# Patient Record
Sex: Male | Born: 1935 | Hispanic: Yes | Marital: Married | Smoking: Former smoker
Health system: Southern US, Community
[De-identification: ages and names within clinical notes are randomized; demographics above are authoritative.]

## PROBLEM LIST (undated history)

## (undated) DIAGNOSIS — C3490 Malignant neoplasm of unspecified part of unspecified bronchus or lung: Secondary | ICD-10-CM

## (undated) DIAGNOSIS — Z9289 Personal history of other medical treatment: Secondary | ICD-10-CM

## (undated) DIAGNOSIS — B9561 Methicillin susceptible Staphylococcus aureus infection as the cause of diseases classified elsewhere: Secondary | ICD-10-CM

## (undated) DIAGNOSIS — I4819 Other persistent atrial fibrillation: Secondary | ICD-10-CM

## (undated) DIAGNOSIS — I2699 Other pulmonary embolism without acute cor pulmonale: Secondary | ICD-10-CM

## (undated) DIAGNOSIS — I82401 Acute embolism and thrombosis of unspecified deep veins of right lower extremity: Secondary | ICD-10-CM

## (undated) DIAGNOSIS — I718 Aortic aneurysm of unspecified site, ruptured: Secondary | ICD-10-CM

## (undated) DIAGNOSIS — Z720 Tobacco use: Secondary | ICD-10-CM

## (undated) DIAGNOSIS — J90 Pleural effusion, not elsewhere classified: Secondary | ICD-10-CM

## (undated) DIAGNOSIS — R7881 Bacteremia: Secondary | ICD-10-CM

## (undated) HISTORY — DX: Malignant neoplasm of unspecified part of unspecified bronchus or lung: C34.90

## (undated) HISTORY — DX: Tobacco use: Z72.0

## (undated) HISTORY — PX: IRRIGATION AND DEBRIDEMENT SEBACEOUS CYST: SHX5255

## (undated) HISTORY — PX: APPENDECTOMY: SHX54

---

## 2020-12-29 ENCOUNTER — Other Ambulatory Visit: Payer: Self-pay

## 2020-12-29 ENCOUNTER — Encounter: Payer: Self-pay | Admitting: Pulmonary Disease

## 2020-12-29 ENCOUNTER — Other Ambulatory Visit: Payer: Self-pay | Admitting: Pulmonary Disease

## 2020-12-29 ENCOUNTER — Ambulatory Visit (INDEPENDENT_AMBULATORY_CARE_PROVIDER_SITE_OTHER): Payer: Medicare Other | Admitting: Pulmonary Disease

## 2020-12-29 VITALS — BP 128/60 | HR 68 | Temp 97.8°F | Ht 68.5 in | Wt 182.6 lb

## 2020-12-29 DIAGNOSIS — R059 Cough, unspecified: Secondary | ICD-10-CM | POA: Diagnosis not present

## 2020-12-29 DIAGNOSIS — R918 Other nonspecific abnormal finding of lung field: Secondary | ICD-10-CM

## 2020-12-29 NOTE — Patient Instructions (Addendum)
Nice to meet you  We have scheduled a biopsy for you on 01/07/2021 at 7:30 AM.  Please arrive at the endoscopy center at [facility] at 5:30 AM.  Please have nothing to eat or drink after midnight the night prior.  Dr. Valeta Harms will do the procedure.  I have already discussed everything with him and he has already looked at the CT scan.

## 2020-12-29 NOTE — Progress Notes (Signed)
@Patient  ID: Leon Lyons, male    DOB: [birth year], 87 y.o.   MRN: [id number]  Chief Complaint  Patient presents with  . Consult    Occasional productive cough with white/clear phlegm for past 3-4 weeks    Referring provider: [name], MD  HPI:   87 year old whom we are seeing in consultation for evaluation of left lung mass with atelectasis.  Patient had onset of productive cough in January 2022.  At that time he was evaluated PCP.  Covid swab was positive.  It was expected he would have improved symptoms over time.  However, after a couple months, several weeks, he continued to have productive cough.  Thick white phlegm.  Worse in the mornings and when he lies down at night.  No new environment to account for the symptoms.  No change with the season, warm weather, pollen.  No other alleviating or exacerbating factors.  Given on going symptoms he insisted on chest x-ray.  This was obtained 12/27/2020 which on my review and interpretation reveals left upper lung field mass with what appears to be volume loss with elevated left hemidiaphragm and partial atelectasis of the left upper lobe given haziness the mediastinal border, this is also evident and more clear on the lateral view.  This prompted CT chest with contrast 12/28/2020 which on my interpretation reveals left upper lobe mass with obstruction of left upper lobe takeoff and atelectasis of left upper lobe with aeration of the lingula with bronchus headed to lingula externally compressed, left station 4 adenopathy, station 7 adenopathy, smaller but apparent right station 4 adenopathy.  Patient takes no medications.  He is on no blood thinners.  Has no respiratory issues, no shortness of breath.  Thinks maybe he was diagnosed or could have had some asthma attack back in 2009.  Describes acute onset of shortness of breath, throat tightness.  This is never recurred since.  No clear questionable why this is happened.  Never took  medicines for it.  Extensive conversation including extensive review of his chest images with patient and wife present today.  Discussed at length concern for cancer and the rationale for concern for cancer based on the images, symptoms.  Discussed at length multiple routes of biopsy to find what this is.  Explained rationale and data for the modality EBUS for obtaining diagnosis also potential staging.  They expressed understanding.  They are eager to move forward with diagnostic tests.  They are planning hoping this is not cancer but again reiterated most likely this is lung cancer and we should beprepared to move forward with cancer therapy once we get results.  PMH: None Surgical history: Reviewed patient, none Family history: No significant respiratory illness in first-degree relatives Social history:.  Career in the [employer], later worked for [employer], grew up in [place], lived [place] of life in [place], moved back to [place] in 2020  ACT:  No flowsheet data found.  MMRC: No flowsheet data found.  Epworth:  No flowsheet data found.  Tests:   FENO:  No results found for: NITRICOXIDE  PFT: No flowsheet data found.  WALK:  No flowsheet data found.  Imaging: Personally reviewed and as per EMR  Lab Results: None to do at this time CBC No results found for: WBC, RBC, HGB, HCT, PLT, MCV, MCH, MCHC, RDW, LYMPHSABS, MONOABS, EOSABS, BASOSABS  BMET No results found for: NA, K, CL, CO2, GLUCOSE, BUN, CREATININE, CALCIUM, GFRNONAA, GFRAA  BNP No results found for:  BNP  ProBNP No results found for: PROBNP  Specialty Problems   None     No Known Allergies   There is no immunization history on file for this patient.  History reviewed. No pertinent past medical history.  Tobacco History: Social History   Tobacco Use  Smoking Status Former Smoker  . Years: 5.00  . Types: Cigarettes  . Quit date: 65  . Years since quitting: 60.2  Smokeless Tobacco  Never Used  Tobacco Comment   light/occasional smoker back in early 1960's   Counseling given: Not Answered Comment: light/occasional smoker back in early 1960's   Continue to not smoke  No outpatient encounter medications on file as of 12/29/2020.   No facility-administered encounter medications on file as of 12/29/2020.     Review of Systems  Review of Systems  No chest pain with exertion.  No headache.  No neurologic changes, weakness, gait disturbance, falls.  Comprehensive review of systems otherwise negative. Physical Exam  BP 128/60 (BP Location: Left Arm, Cuff Size: Normal)   Pulse 68   Temp 97.8 F (36.6 C) (Temporal)   Ht 5' 8.5" (1.74 m)   Wt 182 lb 9.6 oz (82.8 kg)   SpO2 98% Comment: Room air  BMI 27.36 kg/m   Wt Readings from Last 5 Encounters:  12/29/20 182 lb 9.6 oz (82.8 kg)    BMI Readings from Last 5 Encounters:  12/29/20 27.36 kg/m     Physical Exam General: Well-appearing, no distress Eyes: EOMI, icterus Neck: Supple, no JVP Cardiovascular: Regular rhythm occasional extra beat, no murmur Pulmonary: Clear aspiration bilaterally, purely bronchial breath sounds in left upper lung field Abdomen: Nondistended, vessels present MSK: No synovitis, joint effusion Neuro: Normal gait, no weakness Psych: Normal mood, full affect    Assessment & Plan:   Left upper lobe mass:: Obstruction of left upper lobe, lingula seems somewhat patent at this time.  Also with mediastinal lymphadenopathy.  High suspicion for primary lung cancer.  This was discussed with patient at length today.  Discussed the rationale for biopsy and the rationale for EBUS as mode of biopsy.  He expressed understanding.  He is eager to proceed.  Discussed with Dr. Valeta Harms.  Planning for procedure 01/07/21 at 7:30 AM at [facility] with Dr. Valeta Harms.  All questions answered.  Likely would benefit from MRI, PET scan in future.  This can be deferred until biopsy obtained, engaged with  oncology.  Cough: Ongoing cough likely related to lung mass, mucus impaction, inflammation.  Difficult to treat until we know what the lung mass is and potentially have the lung mass treated.  He expressed understanding.  Return if symptoms worsen or fail to improve.   Lanier Clam, MD 12/29/2020   This appointment required 85 minutes of patient care (this includes precharting, chart review, review of results, face-to-face care, etc.).

## 2020-12-29 NOTE — Progress Notes (Signed)
PCCM:  Referral from Dr. Silas Flood.   EBUS orders placed  Date: 01/07/2021   Garner Nash, DO  Pulmonary Critical Care 12/29/2020 2:36 PM

## 2020-12-29 NOTE — H&P (View-Only) (Signed)
@Patient  ID: Leon Lyons, male    DOB: 1933/07/23, 87 y.o.   MRN: [id number]  Chief Complaint  Patient presents with  . Consult    Occasional productive cough with white/clear phlegm for past 3-4 weeks    Referring provider: [name], MD  HPI:   87 year old whom we are seeing in consultation for evaluation of left lung mass with atelectasis.  Patient had onset of productive cough in January 2022.  At that time he was evaluated PCP.  Covid swab was positive.  It was expected he would have improved symptoms over time.  However, after a couple months, several weeks, he continued to have productive cough.  Thick white phlegm.  Worse in the mornings and when he lies down at night.  No new environment to account for the symptoms.  No change with the season, warm weather, pollen.  No other alleviating or exacerbating factors.  Given on going symptoms he insisted on chest x-ray.  This was obtained 12/27/2020 which on my review and interpretation reveals left upper lung field mass with what appears to be volume loss with elevated left hemidiaphragm and partial atelectasis of the left upper lobe given haziness the mediastinal border, this is also evident and more clear on the lateral view.  This prompted CT chest with contrast 12/28/2020 which on my interpretation reveals left upper lobe mass with obstruction of left upper lobe takeoff and atelectasis of left upper lobe with aeration of the lingula with bronchus headed to lingula externally compressed, left station 4 adenopathy, station 7 adenopathy, smaller but apparent right station 4 adenopathy.  Patient takes no medications.  He is on no blood thinners.  Has no respiratory issues, no shortness of breath.  Thinks maybe he was diagnosed or could have had some asthma attack back in 2009.  Describes acute onset of shortness of breath, throat tightness.  This is never recurred since.  No clear questionable why this is happened.  Never took  medicines for it.  Extensive conversation including extensive review of his chest images with patient and wife present today.  Discussed at length concern for cancer and the rationale for concern for cancer based on the images, symptoms.  Discussed at length multiple routes of biopsy to find what this is.  Explained rationale and data for the modality EBUS for obtaining diagnosis also potential staging.  They expressed understanding.  They are eager to move forward with diagnostic tests.  They are planning hoping this is not cancer but again reiterated most likely this is lung cancer and we should beprepared to move forward with cancer therapy once we get results.  PMH: None Surgical history: Reviewed patient, none Family history: No significant respiratory illness in first-degree relatives Social history:.  Career in the [employer], later worked for [employer], grew up in [place], lived [place] of life in [place], moved back to [place] in 2020  ACT:  No flowsheet data found.  MMRC: No flowsheet data found.  Epworth:  No flowsheet data found.  Tests:   FENO:  No results found for: NITRICOXIDE  PFT: No flowsheet data found.  WALK:  No flowsheet data found.  Imaging: Personally reviewed and as per EMR  Lab Results: None to do at this time CBC No results found for: WBC, RBC, HGB, HCT, PLT, MCV, MCH, MCHC, RDW, LYMPHSABS, MONOABS, EOSABS, BASOSABS  BMET No results found for: NA, K, CL, CO2, GLUCOSE, BUN, CREATININE, CALCIUM, GFRNONAA, GFRAA  BNP No results found for:  BNP  ProBNP No results found for: PROBNP  Specialty Problems   None     No Known Allergies   There is no immunization history on file for this patient.  History reviewed. No pertinent past medical history.  Tobacco History: Social History   Tobacco Use  Smoking Status Former Smoker  . Years: 5.00  . Types: Cigarettes  . Quit date: 106  . Years since quitting: 60.2  Smokeless Tobacco  Never Used  Tobacco Comment   light/occasional smoker back in early 1960's   Counseling given: Not Answered Comment: light/occasional smoker back in early 1960's   Continue to not smoke  No outpatient encounter medications on file as of 12/29/2020.   No facility-administered encounter medications on file as of 12/29/2020.     Review of Systems  Review of Systems  No chest pain with exertion.  No headache.  No neurologic changes, weakness, gait disturbance, falls.  Comprehensive review of systems otherwise negative. Physical Exam  BP 128/60 (BP Location: Left Arm, Cuff Size: Normal)   Pulse 68   Temp 97.8 F (36.6 C) (Temporal)   Ht 5' 8.5" (1.74 m)   Wt 182 lb 9.6 oz (82.8 kg)   SpO2 98% Comment: Room air  BMI 27.36 kg/m   Wt Readings from Last 5 Encounters:  12/29/20 182 lb 9.6 oz (82.8 kg)    BMI Readings from Last 5 Encounters:  12/29/20 27.36 kg/m     Physical Exam General: Well-appearing, no distress Eyes: EOMI, icterus Neck: Supple, no JVP Cardiovascular: Regular rhythm occasional extra beat, no murmur Pulmonary: Clear aspiration bilaterally, purely bronchial breath sounds in left upper lung field Abdomen: Nondistended, vessels present MSK: No synovitis, joint effusion Neuro: Normal gait, no weakness Psych: Normal mood, full affect    Assessment & Plan:   Left upper lobe mass:: Obstruction of left upper lobe, lingula seems somewhat patent at this time.  Also with mediastinal lymphadenopathy.  High suspicion for primary lung cancer.  This was discussed with patient at length today.  Discussed the rationale for biopsy and the rationale for EBUS as mode of biopsy.  He expressed understanding.  He is eager to proceed.  Discussed with Dr. Valeta Harms.  Planning for procedure 01/07/21 at 7:30 AM at [facility] with Dr. Valeta Harms.  All questions answered.  Likely would benefit from MRI, PET scan in future.  This can be deferred until biopsy obtained, engaged with  oncology.  Cough: Ongoing cough likely related to lung mass, mucus impaction, inflammation.  Difficult to treat until we know what the lung mass is and potentially have the lung mass treated.  He expressed understanding.  Return if symptoms worsen or fail to improve.   Lanier Clam, MD 12/29/2020   This appointment required 85 minutes of patient care (this includes precharting, chart review, review of results, face-to-face care, etc.).

## 2020-12-30 ENCOUNTER — Telehealth: Payer: Self-pay | Admitting: Pulmonary Disease

## 2020-12-30 NOTE — Telephone Encounter (Signed)
EBUS has been schedule for 01/07/21 at 7:30am. Pt to arrive at check in by 5:30am. Informed pt of procedure and prep. Sched covid test for 01/04/21.//TM

## 2021-01-04 ENCOUNTER — Other Ambulatory Visit (HOSPITAL_COMMUNITY): Payer: Medicare Other

## 2021-01-06 NOTE — Anesthesia Preprocedure Evaluation (Addendum)
Anesthesia Evaluation  Patient identified by MRN, date of birth, ID band Patient awake    Reviewed: Allergy & Precautions, H&P , NPO status , Patient's Chart, lab work & pertinent test results  Airway Mallampati: I  TM Distance: >3 FB Neck ROM: Full    Dental no notable dental hx. (+) Edentulous Upper, Missing, Poor Dentition, Dental Advisory Given,    Pulmonary neg pulmonary ROS, former smoker,    Pulmonary exam normal breath sounds clear to auscultation       Cardiovascular Exercise Tolerance: Good negative cardio ROS Normal cardiovascular exam Rhythm:Regular Rate:Normal     Neuro/Psych negative neurological ROS  negative psych ROS   GI/Hepatic negative GI ROS, Neg liver ROS,   Endo/Other  negative endocrine ROS  Renal/GU negative Renal ROS  negative genitourinary   Musculoskeletal negative musculoskeletal ROS (+)   Abdominal   Peds negative pediatric ROS (+)  Hematology negative hematology ROS (+)   Anesthesia Other Findings   Reproductive/Obstetrics negative OB ROS                            Anesthesia Physical Anesthesia Plan  ASA: III  Anesthesia Plan: General   Post-op Pain Management:    Induction: Intravenous  PONV Risk Score and Plan: 2 and Ondansetron  Airway Management Planned: Oral ETT  Additional Equipment: None  Intra-op Plan:   Post-operative Plan: Extubation in OR  Informed Consent: I have reviewed the patients History and Physical, chart, labs and discussed the procedure including the risks, benefits and alternatives for the proposed anesthesia with the patient or authorized representative who has indicated his/her understanding and acceptance.       Plan Discussed with: CRNA and Anesthesiologist  Anesthesia Plan Comments: (  )        Anesthesia Quick Evaluation

## 2021-01-06 NOTE — Progress Notes (Signed)
I have attempted to contact this patient by phone with no  Results. I instructed - to arrive at -0530-come to the Main Entrance, Entrance 'A'.  You will go in and go to Admitting Office- it is down the hall on the left.  Do Not Eat or Drink after Midnight.  Do not eat after midnight, no gumming gum or hard candy.   Shower with antibiotic soap if you have it, rinse thoroughly.  Dry off with a clean towel. Do not wear any lotions, powders, colognes, deodorant. Men my shave face and neck. Women should not shave within 48 hours prior to surgery.  Brush your teeth. If you wear glasses, contact lens, hearing aids, dentures or partials; you may not wear them in OR, if you have a case for your glasses- bring it. It is best to not wear hearing aids- if it is the only way we can communicate wear them , bring the case- have someone to take the hearing aids and case after you are finished answering questions. Dentures and partials will be removed and placed in a denture cup and placed with your clothes.  Take the following medications with a sip of water   Tylenol if needed. .  If you have any questions or problems or if you are going to be late call [phone], this is the pre- surgery desk; there may not be anyone at the number I called you on tomorrow, so call the number I called you on.  If you are going home after the surgery/procedure, you will need someone to drive you home and stay with you for the first 24 hours after surgery.  If you smoke do not smoke for 24 hours prior to surgery.

## 2021-01-07 ENCOUNTER — Other Ambulatory Visit: Payer: Self-pay

## 2021-01-07 ENCOUNTER — Ambulatory Visit (HOSPITAL_COMMUNITY)
Admission: RE | Admit: 2021-01-07 | Discharge: 2021-01-07 | Disposition: A | Discharge status: Home / Self Care | Payer: Medicare Other | Attending: Pulmonary Disease | Admitting: Pulmonary Disease

## 2021-01-07 ENCOUNTER — Encounter (HOSPITAL_COMMUNITY): Payer: Self-pay | Admitting: Pulmonary Disease

## 2021-01-07 ENCOUNTER — Ambulatory Visit (HOSPITAL_COMMUNITY): Payer: Medicare Other | Admitting: Anesthesiology

## 2021-01-07 ENCOUNTER — Encounter (HOSPITAL_COMMUNITY)
Admission: RE | Disposition: A | Discharge status: Home / Self Care | Payer: Self-pay | Source: Home / Self Care | Attending: Pulmonary Disease

## 2021-01-07 ENCOUNTER — Telehealth: Payer: Self-pay | Admitting: Internal Medicine

## 2021-01-07 DIAGNOSIS — Z8616 Personal history of COVID-19: Secondary | ICD-10-CM | POA: Diagnosis not present

## 2021-01-07 DIAGNOSIS — R918 Other nonspecific abnormal finding of lung field: Secondary | ICD-10-CM | POA: Diagnosis present

## 2021-01-07 DIAGNOSIS — Z87891 Personal history of nicotine dependence: Secondary | ICD-10-CM | POA: Insufficient documentation

## 2021-01-07 DIAGNOSIS — C3412 Malignant neoplasm of upper lobe, left bronchus or lung: Secondary | ICD-10-CM | POA: Diagnosis not present

## 2021-01-07 DIAGNOSIS — C771 Secondary and unspecified malignant neoplasm of intrathoracic lymph nodes: Secondary | ICD-10-CM | POA: Insufficient documentation

## 2021-01-07 HISTORY — PX: BRONCHIAL WASHINGS: SHX5105

## 2021-01-07 HISTORY — PX: BRONCHIAL NEEDLE ASPIRATION BIOPSY: SHX5106

## 2021-01-07 HISTORY — PX: VIDEO BRONCHOSCOPY WITH ENDOBRONCHIAL ULTRASOUND: SHX6177

## 2021-01-07 HISTORY — PX: BRONCHIAL BRUSHINGS: SHX5108

## 2021-01-07 HISTORY — PX: BRONCHIAL BIOPSY: SHX5109

## 2021-01-07 SURGERY — BRONCHOSCOPY, WITH EBUS
Anesthesia: General | Laterality: Bilateral

## 2021-01-07 MED ORDER — LIDOCAINE 2% (20 MG/ML) 5 ML SYRINGE
INTRAMUSCULAR | Status: DC | PRN
  Administered 2021-01-07: 100 mg via INTRAVENOUS

## 2021-01-07 MED ORDER — ALBUTEROL SULFATE (2.5 MG/3ML) 0.083% IN NEBU
2.5000 mg | INHALATION_SOLUTION | Freq: Once | RESPIRATORY_TRACT | Status: AC
  Administered 2021-01-07: 2.5 mg via RESPIRATORY_TRACT

## 2021-01-07 MED ORDER — SUGAMMADEX SODIUM 200 MG/2ML IV SOLN
INTRAVENOUS | Status: DC | PRN
  Administered 2021-01-07: 100 mg via INTRAVENOUS

## 2021-01-07 MED ORDER — CHLORHEXIDINE GLUCONATE 0.12 % MT SOLN: 15.0000 mL | Freq: Once | OROMUCOSAL | Status: AC

## 2021-01-07 MED ORDER — ONDANSETRON HCL 4 MG/2ML IJ SOLN
INTRAMUSCULAR | Status: DC | PRN
  Administered 2021-01-07: 4 mg via INTRAVENOUS

## 2021-01-07 MED ORDER — LACTATED RINGERS IV SOLN: INTRAVENOUS | Status: DC

## 2021-01-07 MED ORDER — EPHEDRINE SULFATE-NACL 50-0.9 MG/10ML-% IV SOSY
PREFILLED_SYRINGE | INTRAVENOUS | Status: DC | PRN
  Administered 2021-01-07 (×2): 10 mg via INTRAVENOUS

## 2021-01-07 MED ORDER — ROCURONIUM BROMIDE 10 MG/ML (PF) SYRINGE
PREFILLED_SYRINGE | INTRAVENOUS | Status: DC | PRN
  Administered 2021-01-07: 50 mg via INTRAVENOUS

## 2021-01-07 MED ORDER — ALBUTEROL SULFATE (2.5 MG/3ML) 0.083% IN NEBU
INHALATION_SOLUTION | RESPIRATORY_TRACT | Status: AC
  Filled 2021-01-07: qty 3

## 2021-01-07 MED ORDER — FENTANYL CITRATE (PF) 250 MCG/5ML IJ SOLN
INTRAMUSCULAR | Status: DC | PRN
  Administered 2021-01-07: 50 ug via INTRAVENOUS

## 2021-01-07 MED ORDER — CHLORHEXIDINE GLUCONATE 0.12 % MT SOLN
OROMUCOSAL | Status: AC
  Administered 2021-01-07: 15 mL via OROMUCOSAL
  Filled 2021-01-07: qty 15

## 2021-01-07 MED ORDER — PHENYLEPHRINE 40 MCG/ML (10ML) SYRINGE FOR IV PUSH (FOR BLOOD PRESSURE SUPPORT)
PREFILLED_SYRINGE | INTRAVENOUS | Status: DC | PRN
  Administered 2021-01-07 (×2): 80 ug via INTRAVENOUS

## 2021-01-07 MED ORDER — PROPOFOL 10 MG/ML IV BOLUS
INTRAVENOUS | Status: DC | PRN
  Administered 2021-01-07: 110 mg via INTRAVENOUS

## 2021-01-07 SURGICAL SUPPLY — 29 items

## 2021-01-07 NOTE — Op Note (Signed)
Video Bronchoscopy with Endobronchial Ultrasound Procedure Note  Date of Operation: 01/07/2021  Pre-op Diagnosis: Lung mass, mediastinal adenopathy  Post-op Diagnosis: Lung mass, mediastinal adenopathy  Surgeon: Garner Nash, DO   Assistants: None   Anesthesia: General endotracheal anesthesia  Operation: Flexible video fiberoptic bronchoscopy with endobronchial ultrasound and biopsies.  Estimated Blood Loss: Minimal  Complications: None   Indications and History: Leon Lyons is a 87 y.o. adult with left upper lobe lung mass, mediastinal adenopathy.  The risks, benefits, complications, treatment options and expected outcomes were discussed with the patient.  The possibilities of pneumothorax, pneumonia, reaction to medication, pulmonary aspiration, perforation of a viscus, bleeding, failure to diagnose a condition and creating a complication requiring transfusion or operation were discussed with the patient who freely signed the consent.    Description of Procedure: The patient was examined in the preoperative area and history and data from the preprocedure consultation were reviewed. It was deemed appropriate to proceed.  The patient was taken to [facility] endoscopy room 2, identified as Leon Lyons and the procedure verified as Flexible Video Fiberoptic Bronchoscopy.  A Time Out was held and the above information confirmed. After being taken to the operating room general anesthesia was initiated and the patient  was orally intubated. The video fiberoptic bronchoscope was introduced via the endotracheal tube and a general inspection was performed which showed normal right lung anatomy, left lower lobe subsegments patent, normal carina, left upper lobe anterior segment fully occluded by tumor, submucosal tumor infiltration and splaying of the left main carina. The standard scope was then withdrawn and the endobronchial ultrasound was used to identify and characterize the peritracheal,  hilar and bronchial lymph nodes. Inspection showed enlarged station 7 adenopathy and visible left upper lobe mass under ultrasound.. Using real-time ultrasound guidance Wang needle biopsies were take from Station 7 nodes and were sent for cytology.  Following endobronchial ultrasound we switch back to the standard therapeutic bronchoscope.  We used cytology brushes to obtain specimens from the left upper lobe, then using 2.5 mm [employer] forceps we obtained endobronchial biopsies of the abnormal mucosa and tumor occluding the left upper lobe anterior segment.  There was no visible airway identified through the left upper lobe anterior segment to consider debulking or opening to help relieve this obstruction.  At the conclusion we completed a left upper lobe BAL to be sent for cytology with moderate return of saline. The patient tolerated the procedure well without apparent complications.  The standard therapeutic bronchoscope was used for aspiration of the bilateral mainstem's and removal of any blood clots and debris. There was no significant blood loss. The bronchoscope was withdrawn. Anesthesia was reversed and the patient was taken to the PACU for recovery.   Samples: 1. Wang needle biopsies from station 7 node 2.  Left upper lobe anterior segment brushings 3.  Left upper lobe endobronchial biopsies 4.  Left upper lobe bronchial washings  Plans:  The patient will be discharged from the PACU to home when recovered from anesthesia. We will review the cytology, pathology results with the patient when they become available. Outpatient followup will be with Garner Nash, [place], DO [place] [facility] 01/07/2021 8:53 AM

## 2021-01-07 NOTE — Anesthesia Procedure Notes (Signed)
Procedure Name: Intubation Date/Time: 01/07/2021 7:42 AM Performed by: Trinna Post., CRNA Pre-anesthesia Checklist: Patient identified, Emergency Drugs available, Suction available, Patient being monitored and Timeout performed Patient Re-evaluated:Patient Re-evaluated prior to induction Oxygen Delivery Method: Circle system utilized Preoxygenation: Pre-oxygenation with 100% oxygen Induction Type: IV induction Ventilation: Mask ventilation without difficulty and Oral airway inserted - appropriate to patient size Laryngoscope Size: Mac and 4 Grade View: Grade I Tube type: Oral Tube size: 8.0 mm Number of attempts: 1 Airway Equipment and Method: Stylet Placement Confirmation: ETT inserted through vocal cords under direct vision,  positive ETCO2 and breath sounds checked- equal and bilateral Secured at: 22 cm Tube secured with: Tape Dental Injury: Teeth and Oropharynx as per pre-operative assessment

## 2021-01-07 NOTE — Interval H&P Note (Signed)
History and Physical Interval Note:  01/07/2021 1:27 AM  Leon Lyons  has presented today for surgery, with the diagnosis of lung mass.  The various methods of treatment have been discussed with the patient and family. After consideration of risks, benefits and other options for treatment, the patient has consented to  Procedure(s) with comments: [place] (Bilateral) - possible need for radial ultrasound and fluoro  as a surgical intervention.  The patient's history has been reviewed, patient examined, no change in status, stable for surgery.  I have reviewed the patient's chart and labs.  Questions were answered to the patient's satisfaction.     [place]

## 2021-01-07 NOTE — Consult Note (Signed)
Synopsis: Referred in March 2022 for video bronchoscopy by No ref. provider found  Subjective:   PATIENT ID: Leon Lyons GENDER: male DOB: 08/06/1933, MRN: [id number]  No chief complaint on file.   This is an 87 year old gentleman, relatively healthy takes no medications except over-the-counter vitamin supplementation.  He is a former smoker quit in 1962.  He was taken to [facility] ED with complaints of shortness of breath.  Patient had a CT scan of the chest for evaluation of cough.  He had new onset productive cough in January 2022.  He was evaluated by PCP which is Covid swab was positive.  He continued to have cough-like symptoms and upper respiratory symptoms and the decision was made for CT scan of the chest which revealed a left upper lobe mass obstructing the left upper lobe takeoff, associated lung collapse, adenopathy within 4L and station 7.  Concern for an advanced stage bronchogenic carcinoma.  Patient takes no medications on no blood thinners.  They discussed the concern of malignancy and cancer diagnosis at office visit with Dr. Silas Flood.  Past medical history: Positive swab for Covid, family history: No history of lung cancer   Past Surgical History:  Procedure Laterality Date  . APPENDECTOMY    . IRRIGATION AND DEBRIDEMENT SEBACEOUS CYST      Social History   Socioeconomic History  . Marital status: Unknown    Spouse name: Not on file  . Number of children: Not on file  . Years of education: Not on file  . Highest education level: Not on file  Occupational History  . Not on file  Tobacco Use  . Smoking status: Former Smoker    Years: 5.00    Types: Cigarettes    Quit date: 1962    Years since quitting: 60.2  . Smokeless tobacco: Never Used  . Tobacco comment: light/occasional smoker back in early 1960's  Vaping Use  . Vaping Use: Never used  Substance and Sexual Activity  . Alcohol use: Not on file  . Drug use: Not on file  . Sexual activity: Not  on file  Other Topics Concern  . Not on file  Social History Narrative  . Not on file   Social Determinants of Health   Financial Resource Strain: Not on file  Food Insecurity: Not on file  Transportation Needs: Not on file  Physical Activity: Not on file  Stress: Not on file  Social Connections: Not on file  Intimate Partner Violence: Not on file     No Known Allergies    Review of Systems  Constitutional: Negative for chills, fever, malaise/fatigue and weight loss.  HENT: Negative for hearing loss, sore throat and tinnitus.   Eyes: Negative for blurred vision and double vision.  Respiratory: Positive for cough. Negative for hemoptysis, sputum production, shortness of breath, wheezing and stridor.   Cardiovascular: Negative for chest pain, palpitations, orthopnea, leg swelling and PND.  Gastrointestinal: Negative for abdominal pain, constipation, diarrhea, heartburn, nausea and vomiting.  Genitourinary: Negative for dysuria, hematuria and urgency.  Musculoskeletal: Negative for joint pain and myalgias.  Skin: Negative for itching and rash.  Neurological: Negative for dizziness, tingling, weakness and headaches.  Endo/Heme/Allergies: Negative for environmental allergies. Does not bruise/bleed easily.  Psychiatric/Behavioral: Negative for depression. The patient is not nervous/anxious and does not have insomnia.   All other systems reviewed and are negative.    Objective:  Physical Exam Vitals reviewed.  Constitutional:      General: She is not  in acute distress.    Appearance: She is well-developed.     Comments: Elderly in good health  HENT:     Head: Normocephalic and atraumatic.  Eyes:     General: No scleral icterus.    Conjunctiva/sclera: Conjunctivae normal.     Pupils: Pupils are equal, round, and reactive to light.  Neck:     Vascular: No JVD.     Trachea: No tracheal deviation.  Cardiovascular:     Rate and Rhythm: Normal rate and regular rhythm.      Heart sounds: Normal heart sounds. No murmur heard.   Pulmonary:     Effort: Pulmonary effort is normal. No tachypnea, accessory muscle usage or respiratory distress.     Breath sounds: Normal breath sounds. No stridor. No wheezing, rhonchi or rales.  Abdominal:     General: Bowel sounds are normal. There is no distension.     Palpations: Abdomen is soft.     Tenderness: There is no abdominal tenderness.  Musculoskeletal:        General: No tenderness.     Cervical back: Neck supple.  Lymphadenopathy:     Cervical: No cervical adenopathy.  Skin:    General: Skin is warm and dry.     Capillary Refill: Capillary refill takes less than 2 seconds.     Findings: No rash.  Neurological:     Mental Status: She is alert and oriented to person, place, and time.  Psychiatric:        Behavior: Behavior normal.      Vitals:   01/07/21 0611  BP: (!) 147/67  Pulse: 62  Resp: 17  Temp: 98.7 F (37.1 C)  TempSrc: Oral  SpO2: 97%  Weight: 83 kg  Height: 5' 8.5" (1.74 m)   97% on RA BMI Readings from Last 3 Encounters:  01/07/21 27.42 kg/m  12/29/20 27.36 kg/m   Wt Readings from Last 3 Encounters:  01/07/21 83 kg  12/29/20 82.8 kg    CBC No results found for: WBC, RBC, HGB, HCT, PLT, MCV, MCH, MCHC, RDW, LYMPHSABS, MONOABS, EOSABS, BASOSABS   Chest Imaging: CT chest March 2022 completed at [facility] available in PACS system: Images reviewed today with patient.  Has an obstructing left upper lobe mass with associated adenopathy concerning for an advanced age bronchogenic carcinoma.   Pulmonary Functions Testing Results: No flowsheet data found.  FeNO:   Pathology:   Echocardiogram:   Heart Catheterization:     Assessment & Plan:   Left upper lobe lung mass Former smoker Mediastinal and hilar adenopathy concerning for an advanced age bronchogenic carcinoma.  Discussion: Today we discussed the risk benefits and alternatives of proceeding with  video bronchoscopy with endobronchial ultrasound. The patient is agreeable to proceed for tissue biopsy. We discussed the risk of bleeding and pneumothorax. I explained that once we have a tissue diagnosis he will also need a PET scan and an MRI. These will be ordered by myself following the tissue biopsy today. We will also help him establish care with medical oncology. Appropriate referrals to be placed following tissue diagnosis. He will also need to meet with radiation oncology.  We will plan for this referral as well.  Garner Nash, DO  Pulmonary Critical Care 01/07/2021 7:01 AM

## 2021-01-07 NOTE — Discharge Instructions (Signed)
Flexible Bronchoscopy, Care After This sheet gives you information about how to care for yourself after your test. Your doctor may also give you more specific instructions. If you have problems or questions, contact your doctor. Follow these instructions at home: Eating and drinking  Do not eat or drink anything (not even water) for 2 hours after your test, or until your numbing medicine (local anesthetic) wears off.  When your numbness is gone and your cough and gag reflexes have come back, you may: ? Eat only soft foods. ? Slowly drink liquids.  The day after the test, go back to your normal diet. Driving  Do not drive for 24 hours if you were given a medicine to help you relax (sedative).  Do not drive or use heavy machinery while taking prescription pain medicine. General instructions   Take over-the-counter and prescription medicines only as told by your doctor.  Return to your normal activities as told. Ask what activities are safe for you.  Do not use any products that have nicotine or tobacco in them. This includes cigarettes and e-cigarettes. If you need help quitting, ask your doctor.  Keep all follow-up visits as told by your doctor. This is important. It is very important if you had a tissue sample (biopsy) taken. Get help right away if:  You have shortness of breath that gets worse.  You get light-headed.  You feel like you are going to pass out (faint).  You have chest pain.  You cough up: ? More than a little blood. ? More blood than before. Summary  Do not eat or drink anything (not even water) for 2 hours after your test, or until your numbing medicine wears off.  Do not use cigarettes. Do not use e-cigarettes.  Get help right away if you have chest pain.   This information is not intended to replace advice given to you by your health care provider. Make sure you discuss any questions you have with your health care provider. Document Released:  07/23/2009 Document Revised: 09/07/2017 Document Reviewed: 10/13/2016 Elsevier Patient Education  2020 [employer].

## 2021-01-07 NOTE — Telephone Encounter (Signed)
Received a new pt referral from Dr. Valeta Harms for lung mass. Mr. Leon Lyons has been cld and scheduled to see Dr. Julien Nordmann on 4/6 at 2:15pm w/labs at 1:45pm. Appt date and time has been given to the pt's wife. Aware to arrive 20 minutes early.

## 2021-01-07 NOTE — Transfer of Care (Signed)
Immediate Anesthesia Transfer of Care Note  Patient: Leon Lyons  Procedure(s) Performed: VIDEO BRONCHOSCOPY WITH ENDOBRONCHIAL ULTRASOUND (Bilateral ) BRONCHIAL NEEDLE ASPIRATION BIOPSIES BRONCHIAL BRUSHINGS BRONCHIAL BIOPSIES BRONCHIAL WASHINGS  Patient Location: PACU and Endoscopy Unit  Anesthesia Type:General  Level of Consciousness: awake and drowsy  Airway & Oxygen Therapy: Patient Spontanous Breathing  Post-op Assessment: Report given to RN and Post -op Vital signs reviewed and stable  Post vital signs: Reviewed and stable  Last Vitals:  Vitals Value Taken Time  BP 160/55 01/07/21 0843  Temp    Pulse 63 01/07/21 0843  Resp 16 01/07/21 0843  SpO2 100 % 01/07/21 0843  Vitals shown include unvalidated device data.  Last Pain:  Vitals:   01/07/21 0642  TempSrc:   PainSc: 0-No pain      Patients Stated Pain Goal: 3 (94/32/00 3794)  Complications: No complications documented.

## 2021-01-09 ENCOUNTER — Encounter (HOSPITAL_COMMUNITY): Payer: Self-pay | Admitting: Pulmonary Disease

## 2021-01-09 NOTE — Anesthesia Postprocedure Evaluation (Signed)
Anesthesia Post Note  Patient: Leon Lyons  Procedure(s) Performed: VIDEO BRONCHOSCOPY WITH ENDOBRONCHIAL ULTRASOUND (Bilateral ) BRONCHIAL NEEDLE ASPIRATION BIOPSIES BRONCHIAL BRUSHINGS BRONCHIAL BIOPSIES BRONCHIAL WASHINGS     Patient location during evaluation: PACU Anesthesia Type: General Level of consciousness: awake and alert Pain management: pain level controlled Vital Signs Assessment: post-procedure vital signs reviewed and stable Respiratory status: spontaneous breathing, nonlabored ventilation, respiratory function stable and patient connected to nasal cannula oxygen Cardiovascular status: blood pressure returned to baseline and stable Postop Assessment: no apparent nausea or vomiting Anesthetic complications: no   No complications documented.  Last Vitals:  Vitals:   01/07/21 0903 01/07/21 0920  BP: (!) 149/55 (!) 133/59  Pulse: 66 63  Resp: (!) 21 13  Temp:    SpO2: (!) 88% 99%    Last Pain:  Vitals:   01/07/21 0920  TempSrc:   PainSc: 0-No pain                 Raeonna Milo

## 2021-01-10 ENCOUNTER — Other Ambulatory Visit: Payer: Self-pay | Admitting: Physician Assistant

## 2021-01-10 DIAGNOSIS — R918 Other nonspecific abnormal finding of lung field: Secondary | ICD-10-CM

## 2021-01-11 LAB — CYTOLOGY - NON PAP

## 2021-01-12 ENCOUNTER — Telehealth: Payer: Self-pay | Admitting: *Deleted

## 2021-01-12 ENCOUNTER — Inpatient Hospital Stay: Payer: Medicare Other

## 2021-01-12 ENCOUNTER — Other Ambulatory Visit: Payer: Self-pay

## 2021-01-12 ENCOUNTER — Inpatient Hospital Stay: Payer: Medicare Other | Attending: Internal Medicine | Admitting: Internal Medicine

## 2021-01-12 VITALS — BP 112/76 | HR 68 | Temp 98.0°F | Resp 19 | Ht 68.5 in | Wt 179.0 lb

## 2021-01-12 DIAGNOSIS — C3412 Malignant neoplasm of upper lobe, left bronchus or lung: Secondary | ICD-10-CM | POA: Diagnosis present

## 2021-01-12 DIAGNOSIS — I700 Atherosclerosis of aorta: Secondary | ICD-10-CM | POA: Insufficient documentation

## 2021-01-12 DIAGNOSIS — C3492 Malignant neoplasm of unspecified part of left bronchus or lung: Secondary | ICD-10-CM | POA: Insufficient documentation

## 2021-01-12 DIAGNOSIS — Z923 Personal history of irradiation: Secondary | ICD-10-CM | POA: Diagnosis not present

## 2021-01-12 DIAGNOSIS — C771 Secondary and unspecified malignant neoplasm of intrathoracic lymph nodes: Secondary | ICD-10-CM | POA: Insufficient documentation

## 2021-01-12 DIAGNOSIS — N132 Hydronephrosis with renal and ureteral calculous obstruction: Secondary | ICD-10-CM | POA: Insufficient documentation

## 2021-01-12 DIAGNOSIS — Z5111 Encounter for antineoplastic chemotherapy: Secondary | ICD-10-CM

## 2021-01-12 DIAGNOSIS — R918 Other nonspecific abnormal finding of lung field: Secondary | ICD-10-CM

## 2021-01-12 DIAGNOSIS — Z87891 Personal history of nicotine dependence: Secondary | ICD-10-CM | POA: Insufficient documentation

## 2021-01-12 DIAGNOSIS — K8020 Calculus of gallbladder without cholecystitis without obstruction: Secondary | ICD-10-CM | POA: Diagnosis not present

## 2021-01-12 LAB — CMP (CANCER CENTER ONLY)
ALT: 10 U/L (ref 0–44)
AST: 12 U/L — ABNORMAL LOW (ref 15–41)
Albumin: 2.9 g/dL — ABNORMAL LOW (ref 3.5–5.0)
Alkaline Phosphatase: 57 U/L (ref 38–126)
Anion gap: 8 (ref 5–15)
BUN: 15 mg/dL (ref 8–23)
CO2: 25 mmol/L (ref 22–32)
Calcium: 8.6 mg/dL — ABNORMAL LOW (ref 8.9–10.3)
Chloride: 106 mmol/L (ref 98–111)
Creatinine: 1.29 mg/dL — ABNORMAL HIGH (ref 0.61–1.24)
GFR, Estimated: 54 mL/min — ABNORMAL LOW (ref >60)
Glucose, Bld: 119 mg/dL — ABNORMAL HIGH (ref 70–99)
Potassium: 4.6 mmol/L (ref 3.5–5.1)
Sodium: 139 mmol/L (ref 135–145)
Total Bilirubin: 0.3 mg/dL (ref 0.3–1.2)
Total Protein: 8.2 g/dL — ABNORMAL HIGH (ref 6.5–8.1)

## 2021-01-12 LAB — CBC WITH DIFFERENTIAL (CANCER CENTER ONLY)
Abs Immature Granulocytes: 0.02 10*3/uL (ref 0.00–0.07)
Basophils Absolute: 0.1 10*3/uL (ref 0.0–0.1)
Basophils Relative: 1 %
Eosinophils Absolute: 0.1 10*3/uL (ref 0.0–0.5)
Eosinophils Relative: 1 %
HCT: 32.8 % — ABNORMAL LOW (ref 39.0–52.0)
Hemoglobin: 10.6 g/dL — ABNORMAL LOW (ref 13.0–17.0)
Immature Granulocytes: 0 %
Lymphocytes Relative: 38 %
Lymphs Abs: 2 10*3/uL (ref 0.7–4.0)
MCH: 32.6 pg (ref 26.0–34.0)
MCHC: 32.3 g/dL (ref 30.0–36.0)
MCV: 100.9 fL — ABNORMAL HIGH (ref 80.0–100.0)
Monocytes Absolute: 0.3 10*3/uL (ref 0.1–1.0)
Monocytes Relative: 7 %
Neutro Abs: 2.7 10*3/uL (ref 1.7–7.7)
Neutrophils Relative %: 53 %
Platelet Count: 309 10*3/uL (ref 150–400)
RBC: 3.25 MIL/uL — ABNORMAL LOW (ref 4.22–5.81)
RDW: 13.5 % (ref 11.5–15.5)
WBC Count: 5.1 10*3/uL (ref 4.0–10.5)
nRBC: 0 % (ref 0.0–0.2)

## 2021-01-12 MED ORDER — PROCHLORPERAZINE MALEATE 10 MG PO TABS: 10.0000 mg | ORAL_TABLET | Freq: Four times a day (QID) | ORAL | 0 refills | Status: DC | PRN

## 2021-01-12 NOTE — Telephone Encounter (Signed)
I received notification that PET/MRI brain have been authed with insurance. I called patient but was unable to reach.  I did leave vm message updating that scans are scheduled and I will call him back with an update.

## 2021-01-12 NOTE — Progress Notes (Addendum)
START ON PATHWAY REGIMEN - Non-Small Cell Lung     Administer weekly:     Paclitaxel      Carboplatin   **Always confirm dose/schedule in your pharmacy ordering system**  Patient Characteristics: Preoperative or Nonsurgical Candidate (Clinical Staging), Stage III - Nonsurgical Candidate (Nonsquamous and Squamous), PS = 0, 1 Therapeutic Status: Preoperative or Nonsurgical Candidate (Clinical Staging) AJCC T Category: cT3 AJCC N Category: cN2 AJCC M Category: cM0 AJCC 8 Stage Grouping: IIIB ECOG Performance Status: 1 Intent of Therapy: Non-Curative / Palliative Intent, Discussed with Patient

## 2021-01-12 NOTE — Progress Notes (Signed)
[name] [phone]   [phone]  CONSULT NOTE  REFERRING PHYSICIAN: Dr. Leory Plowman Icard  REASON FOR CONSULTATION:  87 years old white male recently diagnosed with lung cancer.  HPI Leon Lyons is a 87 y.o. adult with no significant past medical history except for appendectomy and resection of sebaceous cyst from the back.  The patient also has very few years of smoking in his early life for around 5 years but quit 60 years ago.  He has been complaining of cough productive of whitish sputum for around 4 weeks started in the middle of February 2022.  The patient was seen by his primary care physician and requested a chest x-ray which was performed on December 27, 2020 and it showed masslike consolidation in the left perihilar region with atelectasis. This was followed by CT scan of the chest on December 28, 2020 and it showed 4.0 x 2.4 cm ill-defined left hilar mass which results in occlusion of the left upper lobe bronchus and associated postobstructive atelectasis.  This is highly concerning for malignancy.  There was enlarged left paratracheal lymph node concerning for metastatic disease.  The patient also had left hydronephrosis concerning for distal obstruction and mild ascites noted around the liver and spleen.  He was referred to Dr. Valeta Harms and on Leon Lyons 1, 2021 he underwent video bronchoscopy with endobronchial ultrasound procedure. The final pathology ([id number]) showed the fine-needle aspiration from level 7 lymph node as well as the brushing and biopsies of the left upper lobe had malignant cells consistent with non-small cell carcinoma.  Immunohistochemical stains are still pending to identify the subtype. The patient was referred to me today for evaluation and recommendation regarding treatment of his condition.  When seen today he is feeling fine except for the cough but no significant chest pain, shortness of breath or hemoptysis.  He denied  having any weight loss or night sweats.  He has no nausea, vomiting, diarrhea or constipation.  He has no headache or visual changes. Family history significant for father died from alcoholism in early age.  Mother died from old age at age 38 and an aunt died at an old age at 1. The patient is married and has no children.  He was accompanied today by his wife Leon Lyons.  He used to work for the [employer].  He is currently retired.  He has a history of smoking lightly for around 5 years but quit more than 60 years ago.  He has no history of alcohol or drug abuse. HPI  No past medical history on file.  Past Surgical History:  Procedure Laterality Date  . APPENDECTOMY    . BRONCHIAL BIOPSY  01/07/2021   Procedure: BRONCHIAL BIOPSIES;  Surgeon: Garner Nash, DO;  Location: [facility];  Service: Pulmonary;;  . BRONCHIAL BRUSHINGS  01/07/2021   Procedure: BRONCHIAL BRUSHINGS;  Surgeon: Garner Nash, DO;  Location: [place];  Service: Pulmonary;;  . BRONCHIAL NEEDLE ASPIRATION BIOPSY  01/07/2021   Procedure: BRONCHIAL NEEDLE ASPIRATION BIOPSIES;  Surgeon: Garner Nash, DO;  Location: [place];  Service: Pulmonary;;  . BRONCHIAL WASHINGS  01/07/2021   Procedure: BRONCHIAL WASHINGS;  Surgeon: Garner Nash, DO;  Location: [place];  Service: Pulmonary;;  . IRRIGATION AND DEBRIDEMENT SEBACEOUS CYST    . VIDEO BRONCHOSCOPY WITH ENDOBRONCHIAL ULTRASOUND Bilateral 01/07/2021   Procedure: VIDEO BRONCHOSCOPY WITH ENDOBRONCHIAL ULTRASOUND;  Surgeon: Garner Nash, DO;  Location: [facility];  Service: Pulmonary;  Laterality: Bilateral;  possible need for radial ultrasound and fluoro     No family history on file.  Social History Social History   Tobacco Use  . Smoking status: Former Smoker    Years: 5.00    Types: Cigarettes    Quit date: 1962    Years since quitting: 60.3  . Smokeless tobacco: Never Used  . Tobacco comment: light/occasional smoker back in early  1960's  Vaping Use  . Vaping Use: Never used    No Known Allergies  Current Outpatient Medications  Medication Sig Dispense Refill  . acetaminophen (TYLENOL) 325 MG tablet Take 650 mg by mouth every 6 (six) hours as needed for moderate pain or headache.    . Ascorbic Acid (VITAMIN C) 1000 MG tablet Take 1,000 mg by mouth once a week.    . cholecalciferol (VITAMIN D3) 25 MCG (1000 UNIT) tablet Take 1,000 Units by mouth once a week.    . Magnesium 400 MG CAPS Take 400 mg by mouth once a week.    . Multiple Vitamin (MULTIVITAMIN WITH MINERALS) TABS tablet Take 1 tablet by mouth once a week.    . Multiple Vitamins-Minerals (AIRBORNE PO) Take 1 tablet by mouth once a week.    . naproxen sodium (ALEVE) 220 MG tablet Take 220 mg by mouth daily as needed (pain).    . Omega-3 Fatty Acids (FISH OIL ULTRA) 1400 MG CAPS Take 1,400 mg by mouth once a week.    . vitamin E 180 MG (400 UNITS) capsule Take 400 Units by mouth once a week.     No current facility-administered medications for this visit.    Review of Systems  Constitutional: positive for fatigue Eyes: negative Ears, nose, mouth, throat, and face: negative Respiratory: positive for cough Cardiovascular: negative Gastrointestinal: negative Genitourinary:negative Integument/breast: negative Hematologic/lymphatic: negative Musculoskeletal:negative Neurological: negative Behavioral/Psych: negative Endocrine: negative Allergic/Immunologic: negative  Physical Exam  QBV:[id number], healthy, no distress, well nourished and well developed SKIN: skin color, texture, turgor are normal, no rashes or significant lesions HEAD: Normocephalic, No masses, lesions, tenderness or abnormalities EYES: normal, PERRLA, Conjunctiva are pink and non-injected EARS: External ears normal, Canals clear OROPHARYNX:no exudate, no erythema and lips, buccal mucosa, and tongue normal  NECK: supple, no adenopathy, no JVD LYMPH:  no palpable lymphadenopathy, no  hepatosplenomegaly LUNGS: coarse sounds heard, decreased breath sounds HEART: regular rate & rhythm, no murmurs and no gallops ABDOMEN:abdomen soft, non-tender, normal bowel sounds and no masses or organomegaly BACK: No CVA tenderness, Range of motion is normal EXTREMITIES:no joint deformities, effusion, or inflammation, no edema  NEURO: alert & oriented x 3 with fluent speech, no focal motor/sensory deficits  PERFORMANCE STATUS: ECOG 1  LABORATORY DATA: Lab Results  Component Value Date   WBC 5.1 01/12/2021   HGB 10.6 (L) 01/12/2021   HCT 32.8 (L) 01/12/2021   MCV 100.9 (H) 01/12/2021   PLT 309 01/12/2021      Chemistry      Component Value Date/Time   NA 139 01/12/2021 1332   K 4.6 01/12/2021 1332   CL 106 01/12/2021 1332   CO2 25 01/12/2021 1332   BUN 15 01/12/2021 1332   CREATININE 1.29 (H) 01/12/2021 1332      Component Value Date/Time   CALCIUM 8.6 (L) 01/12/2021 1332   ALKPHOS 57 01/12/2021 1332   AST 12 (L) 01/12/2021 1332   ALT 10 01/12/2021 1332   BILITOT 0.3 01/12/2021 1332       RADIOGRAPHIC STUDIES: No results found.  ASSESSMENT: This is a very pleasant 87 years old white male recently diagnosed with at least stage IIIb (T3, N2, Mx) non-small cell lung cancer pending staging work-up and further immunohistochemical stains to identify the underlying histology diagnosed in Leon Lyons 2022.  The patient presented with obstructive right upper lobe lung mass in addition to mediastinal lymphadenopathy.   PLAN: I had a lengthy discussion with the patient and his wife today about his current disease stage, prognosis and treatment options. I personally and independently reviewed the scan images and discussed the result and showed the images to the patient and his wife. I recommended for the patient to complete the staging work-up and he had a PET scan and MRI of the brain ordered by Dr. Valeta Harms and hopefully this will be done next week. We will wait for the final  immunohistochemical stains to identify the non-small cell lung cancer subtype and likely to be adenocarcinoma especially with his light smoking history and early age.  If the final histology is consistent with adenocarcinoma, will send his tissue block to be tested for molecular studies and PD-L1 expression. I will refer the patient to radiation oncology for discussion of the radiotherapy option of his treatment. If there is no evidence of metastatic disease, the patient would benefit from a course of concurrent chemoradiation with weekly carboplatin for AUC of 2 and paclitaxel 45 mg/M2 for 6-7 weeks followed by consolidation immunotherapy if he has no evidence for disease progression. I discussed with the patient the adverse effect of the chemotherapy including but not limited to mild alopecia, myelosuppression, nausea and vomiting, peripheral neuropathy, liver or renal dysfunction. I will arrange for the patient to have a chemotherapy education class before the first dose of his treatment. The patient is expected to start the first dose of this treatment on Leon Lyons 18, 2022. I will call his pharmacy with prescription for Compazine 10 mg p.o. every 6 hours as needed for nausea. The patient will come back for follow-up visit with the start of his treatment for discussion of his imaging studies as well as the subtype of histology. He was advised to call immediately if he has any other concerning symptoms in the interval.  The patient voices understanding of current disease status and treatment options and is in agreement with the current care plan.  All questions were answered. The patient knows to call the clinic with any problems, questions or concerns. We can certainly see the patient much sooner if necessary.  Thank you so much for allowing me to participate in the care of Leon Lyons. I will continue to follow up the patient with you and assist in her care.  The total time spent in the  appointment was 90 minutes.  Disclaimer: This note was dictated with voice recognition software. Similar sounding words can inadvertently be transcribed and may not be corrected upon review.   Eilleen Kempf Leon Lyons 6, 2022, 2:10 PM

## 2021-01-13 ENCOUNTER — Other Ambulatory Visit: Payer: Self-pay

## 2021-01-13 ENCOUNTER — Telehealth: Payer: Self-pay | Admitting: *Deleted

## 2021-01-13 LAB — CYTOLOGY - NON PAP

## 2021-01-13 NOTE — Telephone Encounter (Signed)
I called Mr. Klinke today.  I updated on MRI and PET scan appt and pre-procedure instructions.  He verbalized understanding.

## 2021-01-14 ENCOUNTER — Telehealth: Payer: Self-pay | Admitting: Internal Medicine

## 2021-01-14 ENCOUNTER — Encounter: Payer: Self-pay | Admitting: *Deleted

## 2021-01-14 NOTE — Telephone Encounter (Signed)
Scheduled per los. Called and spoke with patient. And husband. Confirmed appts

## 2021-01-14 NOTE — Progress Notes (Signed)
Followed up on Leon Lyons's schedule. Plan of care is in place at this time.

## 2021-01-17 ENCOUNTER — Telehealth: Payer: Self-pay | Admitting: Internal Medicine

## 2021-01-17 NOTE — Telephone Encounter (Signed)
Called and spoke with patient to inform that infusion appts were added

## 2021-01-19 ENCOUNTER — Inpatient Hospital Stay: Payer: Medicare Other

## 2021-01-19 ENCOUNTER — Other Ambulatory Visit: Payer: Self-pay

## 2021-01-19 ENCOUNTER — Encounter: Payer: Self-pay | Admitting: Physician Assistant

## 2021-01-19 NOTE — Progress Notes (Signed)
Met with patient at registration to introduce my self as Arboriculturist and to offer available resources.  Discussed one-time $1000 Radio broadcast assistant to assist with personal expenses while going through treatment.  Gave him my card if interested in applying and for any additional financial questions or concerns.

## 2021-01-19 NOTE — Progress Notes (Signed)
Pharmacist Chemotherapy Monitoring - Initial Assessment    Anticipated start date: 01/26/21   Regimen:  . Are orders appropriate based on the patient's diagnosis, regimen, and cycle? Yes . Does the plan date match the patient's scheduled date? Yes . Is the sequencing of drugs appropriate? Yes . Are the premedications appropriate for the patient's regimen? Yes . Prior Authorization for treatment is: Approved o If applicable, is the correct biosimilar selected based on the patient's insurance? not applicable  Organ Function and Labs: Marland Kitchen Are dose adjustments needed based on the patient's renal function, hepatic function, or hematologic function? No . Are appropriate labs ordered prior to the start of patient's treatment? Yes . Other organ system assessment, if indicated: N/A . The following baseline labs, if indicated, have been ordered: N/A  Dose Assessment: . Are the drug doses appropriate? Yes . Are the following correct: o Drug concentrations Yes o IV fluid compatible with drug Yes o Administration routes Yes o Timing of therapy Yes . If applicable, does the patient have documented access for treatment and/or plans for port-a-cath placement? no . If applicable, have lifetime cumulative doses been properly documented and assessed? yes Lifetime Dose Tracking  No doses have been documented on this patient for the following tracked chemicals: Doxorubicin, Epirubicin, Idarubicin, Daunorubicin, Mitoxantrone, Bleomycin, Oxaliplatin, Carboplatin, Liposomal Doxorubicin  o   Toxicity Monitoring/Prevention: . The patient has the following take home antiemetics prescribed: Prochlorperazine . The patient has the following take home medications prescribed: N/A . Medication allergies and previous infusion related reactions, if applicable, have been reviewed and addressed. Yes . The patient's current medication list has been assessed for drug-drug interactions with their chemotherapy regimen. no  significant drug-drug interactions were identified on review.  Order Review: . Are the treatment plan orders signed? Yes . Is the patient scheduled to see a provider prior to their treatment? Yes  I verify that I have reviewed each item in the above checklist and answered each question accordingly.   Kennith Center, Pharm.D., CPP 01/19/2021@9 :04 AM

## 2021-01-20 NOTE — Progress Notes (Signed)
[facility] OFFICE PROGRESS NOTE  Leon Right, MD [address] [place] [address]  DIAGNOSIS: Stage IIIB non-small cell lung cancer, morphology and immunophenotype is consistent with poorly differentiated squamous cell carcinoma.  He presented with a centrally obstructing left upper lobe mass with contralateral mediastinal lymphadenopathy.  There is also an indeterminate Lyons paramidline soft tissue nodule in the L2 vertebrae which is indeterminate.  He was diagnosed in April 2022.  PDL1 Expression: 0%  PRIOR THERAPY: None  CURRENT THERAPY: Weekly concurrent chemoradiation with carboplatin for AUC of 2 and paclitaxel 45 mg per metered squared.  First dose expected on 01/31/21.   INTERVAL HISTORY: Leon Lyons 87 y.o. adult returns to the clinic today for a follow-up visit accompanied by his wife.  The patient is feeling "good" today without any concerning complaints. The patient was recently diagnosed lung cancer.  At his last visit, we will still waiting for the final pathology report.  He also needed the initial staging brain MRI and PET scan which was performed a few days ago.  The patient is scheduled to meet with Dr. Sondra Come from radiation oncology on 02/02/2021 for consultation.  Otherwise the patient denies any recent fever, chills, night sweats, or weight loss.  He continues to report a cough which is worse in the morning due to post nasal drainage but denies any significant chest pain, shortness of breath, or hemoptysis.  He denies any nausea, vomiting, or diarrhea. He had mild constipation a few days ago but managed this successfully with stool softener.  He denies any headache or visual changes. He denies slurred speech, facial droop, or extremity weakness.  The patient is here today for evaluation and to review his MRI and PET scan results and to have a more detailed discussion about his final stage and recommended treatment options.  MEDICAL  HISTORY:No past medical history on file.  ALLERGIES:  has No Known Allergies.  MEDICATIONS:  Current Outpatient Medications  Medication Sig Dispense Refill  . acetaminophen (TYLENOL) 325 MG tablet Take 650 mg by mouth every 6 (six) hours as needed for moderate pain or headache.    . Ascorbic Acid (VITAMIN C) 1000 MG tablet Take 1,000 mg by mouth once a week.    . cholecalciferol (VITAMIN D3) 25 MCG (1000 UNIT) tablet Take 1,000 Units by mouth once a week.    . Magnesium 400 MG CAPS Take 400 mg by mouth once a week.    . Multiple Vitamin (MULTIVITAMIN WITH MINERALS) TABS tablet Take 1 tablet by mouth once a week.    . Multiple Vitamins-Minerals (AIRBORNE PO) Take 1 tablet by mouth once a week.    . naproxen sodium (ALEVE) 220 MG tablet Take 220 mg by mouth daily as needed (pain).    . Omega-3 Fatty Acids (FISH OIL ULTRA) 1400 MG CAPS Take 1,400 mg by mouth once a week.    . prochlorperazine (COMPAZINE) 10 MG tablet Take 1 tablet (10 mg total) by mouth every 6 (six) hours as needed for nausea or vomiting. 30 tablet 0  . vitamin E 180 MG (400 UNITS) capsule Take 400 Units by mouth once a week.     No current facility-administered medications for this visit.    SURGICAL HISTORY:  Past Surgical History:  Procedure Laterality Date  . APPENDECTOMY    . BRONCHIAL BIOPSY  01/07/2021   Procedure: BRONCHIAL BIOPSIES;  Surgeon: Garner Nash, DO;  Location: [facility];  Service: Pulmonary;;  .  BRONCHIAL BRUSHINGS  01/07/2021   Procedure: BRONCHIAL BRUSHINGS;  Surgeon: Garner Nash, DO;  Location: [facility];  Service: Pulmonary;;  . BRONCHIAL NEEDLE ASPIRATION BIOPSY  01/07/2021   Procedure: BRONCHIAL NEEDLE ASPIRATION BIOPSIES;  Surgeon: Garner Nash, DO;  Location: [facility];  Service: Pulmonary;;  . BRONCHIAL WASHINGS  01/07/2021   Procedure: BRONCHIAL WASHINGS;  Surgeon: Garner Nash, DO;  Location: [facility];  Service: Pulmonary;;  . IRRIGATION AND DEBRIDEMENT SEBACEOUS  CYST    . VIDEO BRONCHOSCOPY WITH ENDOBRONCHIAL ULTRASOUND Bilateral 01/07/2021   Procedure: VIDEO BRONCHOSCOPY WITH ENDOBRONCHIAL ULTRASOUND;  Surgeon: Garner Nash, DO;  Location: [facility];  Service: Pulmonary;  Laterality: Bilateral;  possible need for radial ultrasound and fluoro     REVIEW OF SYSTEMS:   Review of Systems  Constitutional: Negative for appetite change, chills, fatigue, fever and unexpected weight change.  HENT:   Negative for mouth sores, nosebleeds, sore throat and trouble swallowing.   Eyes: Negative for eye problems and icterus.  Respiratory: Positive for cough. Negative for hemoptysis, shortness of breath and wheezing.   Cardiovascular: Negative for chest pain and leg swelling.  Gastrointestinal: Negative for abdominal pain, constipation, diarrhea, nausea and vomiting.  Genitourinary: Negative for bladder incontinence, difficulty urinating, dysuria, frequency and hematuria.   Musculoskeletal: Negative for back pain, gait problem, neck pain and neck stiffness.  Skin: Negative for itching and rash.  Neurological: Negative for dizziness, extremity weakness, gait problem, headaches, light-headedness and seizures.  Hematological: Negative for adenopathy. Does not bruise/bleed easily.  Psychiatric/Behavioral: Negative for confusion, depression and sleep disturbance. The patient is not nervous/anxious.     PHYSICAL EXAMINATION:  Blood pressure 137/61, pulse 62, temperature 97.7 F (36.5 C), temperature source Tympanic, resp. rate 15, height 5' 8.5" (1.74 m), weight 183 lb 8 oz (83.2 kg), SpO2 99 %.  ECOG PERFORMANCE STATUS: 1 - Symptomatic but completely ambulatory  Physical Exam  Constitutional: Oriented to person, place, and time and well-developed, well-nourished, and in no distress. HENT:  Head: Normocephalic and atraumatic.  Mouth/Throat: Oropharynx is clear and moist. No oropharyngeal exudate.  Eyes: Conjunctivae are normal. Lyons eye exhibits no  discharge. Left eye exhibits no discharge. No scleral icterus.  Neck: Normal range of motion. Neck supple.  Cardiovascular: Normal rate, regular rhythm, normal heart sounds and intact distal pulses.   Pulmonary/Chest: Effort normal and breath sounds normal. No respiratory distress. No wheezes. No rales.  Abdominal: Soft. Bowel sounds are normal. Exhibits no distension and no mass. There is no tenderness.  Musculoskeletal: Normal range of motion. Exhibits no edema.  Lymphadenopathy:    No cervical adenopathy.  Neurological: Alert and oriented to person, place, and time. Exhibits normal muscle tone. Gait normal. Coordination normal.  Skin: Skin is warm and dry. No rash noted. Not diaphoretic. No erythema. No pallor.  Psychiatric: Mood, memory and judgment normal.  Vitals reviewed.  LABORATORY DATA: Lab Results  Component Value Date   WBC 6.1 01/26/2021   HGB 10.5 (L) 01/26/2021   HCT 32.7 (L) 01/26/2021   MCV 100.9 (H) 01/26/2021   PLT 342 01/26/2021      Chemistry      Component Value Date/Time   NA 140 01/26/2021 0727   K 4.3 01/26/2021 0727   CL 104 01/26/2021 0727   CO2 28 01/26/2021 0727   BUN 15 01/26/2021 0727   CREATININE 1.02 01/26/2021 0727      Component Value Date/Time   CALCIUM 9.1 01/26/2021 0727   ALKPHOS 58 01/26/2021 0727  AST 12 (L) 01/26/2021 0727   ALT 7 01/26/2021 0727   BILITOT <0.2 (L) 01/26/2021 0727       RADIOGRAPHIC STUDIES:  MR BRAIN W WO CONTRAST  Result Date: 01/24/2021 CLINICAL DATA:  Lung cancer, staging EXAM: MRI HEAD WITHOUT AND WITH CONTRAST TECHNIQUE: Multiplanar, multiecho pulse sequences of the brain and surrounding structures were obtained without and with intravenous contrast. CONTRAST:  108mL GADAVIST GADOBUTROL 1 MMOL/ML IV SOLN COMPARISON:  None. FINDINGS: Brain: There is no acute infarction or intracranial hemorrhage. A small cortical/subcortical focus of diffusion hyperintensity with ADC isointensity in the left occipital lobe  is present without associated enhancement. Suspect very small left falcine meningioma measuring 4 mm (series 20, image 136; series 11, image 46). There is no mass effect or edema. There is no hydrocephalus or extra-axial fluid collection. Ventricles and sulci are within normal limits in size and configuration. Patchy foci of T2 hyperintensity in the supratentorial and pontine white matter are nonspecific but may reflect mild to moderate chronic microvascular ischemic changes. 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 Few small chronic cerebral white matter infarcts. No abnormal enhancement. Vascular: Major vessel flow voids at the skull base are preserved. Skull and upper cervical spine: Normal marrow signal is preserved. Sinuses/Orbits: Minor mucosal thickening.  Orbits are unremarkable. Other: Sella is unremarkable.  Mastoid air cells are clear. IMPRESSION: No evidence of intracranial metastatic disease. Small left occipital subacute infarct. Probable small meningioma along the falx. Electronically Signed   By: Macy Mis M.D.   On: 01/24/2021 10:09   NM PET Image Initial (PI) Skull Base To Thigh  Result Date: 01/24/2021 CLINICAL DATA:  Initial treatment strategy for lung cancer. EXAM: NUCLEAR MEDICINE PET SKULL BASE TO THIGH TECHNIQUE: 8.9 mCi F-18 FDG was injected intravenously. Full-ring PET imaging was performed from the skull base to thigh after the radiotracer. CT data was obtained and used for attenuation correction and anatomic localization. Fasting blood glucose: 97 mg/dl COMPARISON:  CT chest 12/28/2020. FINDINGS: Mediastinal blood pool activity: SUV max 1.9 Liver activity: SUV max NA NECK: No abnormal hypermetabolism. Incidental CT findings: None. CHEST: Mildly asymmetric hypermetabolism in the left lobe of the thyroid, SUV max 3.6. Hypermetabolic mediastinal lymph nodes are seen bilaterally. Index Lyons paratracheal lymph node measures 12 mm (4/60) with an SUV max of 15.1.  Hypermetabolic left hilar adenopathy with a central left upper lobe hypermetabolic mass best measured on 12/28/2020, SUV max of 11.3. Incidental CT findings: Atherosclerotic calcification of the aorta, aortic valve and coronary arteries. Aberrant Lyons subclavian artery. Heart is enlarged. No pericardial effusion. Obstruction of the left upper lobe bronchus with post obstructive collapse/consolidation in the left upper lobe, increased from 12/28/2020. ABDOMEN/PELVIS: No abnormal hypermetabolism in the liver, adrenal glands, spleen or pancreas. No hypermetabolic lymph nodes. Incidental CT findings: Liver is unremarkable. Stones are seen in the gallbladder. Slight nodular thickening of both adrenal glands. Stones are seen in the Lyons kidney. Marked atrophy in the left kidney with hydronephrosis the level of the ureteropelvic junction. Spleen and pancreas are unremarkable. Proximal gastric wall thickening. Scattered ascites. Bladder wall thickening. Prostate is enlarged. Cystic structure in the small bowel mesentery has areas of peripheral calcification, measuring 5.8 x 6.2 cm. There are low-density fluid collection along the subcutaneous ventral midline abdominal wall, which may be due to prior surgery. Atherosclerotic calcification of the aorta. SKELETON: There may be a 13 mm Lyons paramidline soft tissue nodule posterior to the L2 vertebral body (4/123) with an SUV max  of 5.0. No hypermetabolic osseous lesions. Incidental CT findings: Degenerative changes in the spine. IMPRESSION: 1. Centrally obstructing hypermetabolic left upper lobe mass with hypermetabolic adenopathy extending to the contralateral mediastinum. Findings are most consistent with stage IIIB primary bronchogenic carcinoma. 2. Hypermetabolic Lyons paramidline soft tissue nodule posterior to the L2 vertebral body, indeterminate. Finding may be degenerative. Difficult to exclude a metastasis. 3. Peripherally calcified cystic lesion in the small bowel  mesentery without associated hypermetabolism, favoring a benign lesion. 4. Asymmetric hypermetabolism in the left lobe of the thyroid. Consider thyroid ultrasound in further evaluation, as clinically indicated. 5. Cholelithiasis. 6. Lyons renal stones. 7. Left renal atrophy with hydronephrosis, suggesting a chronic UPJ obstruction. 8. Proximal gastric wall thickening. 9. Enlarged prostate. 10. Aortic atherosclerosis (ICD10-I70.0). Coronary artery calcification. Electronically Signed   By: Lorin Picket M.D.   On: 01/24/2021 10:46     ASSESSMENT/PLAN:  This is a very pleasant 87 year old male diagnosed with stage IIIB non-small cell lung cancer, most consistent with squamous cell carcinoma.  He presented with a centrally obstructing left upper lobe lung mass with contralateral mediastinal lymphadenopathy.  There is also an indeterminate midline soft tissue nodule posterior to the L2 vertebrae which is indeterminate.  He was diagnosed in April 2022.  His PD-L1 expression was 0%.  The patient recently had a staging PET scan and brain MRI performed.  Dr. Julien Nordmann personally and independently reviewed the scan results and discussed the results with the patient today. There was no metastatic disease to the brain. The scan did note an incidential subacute infarct. He is asymptomatic  Dr. Julien Nordmann still recommends treatment with concurrent chemoradiation with carboplatin for an AUC of 2 and paclitaxel 45 mg per metered squared.  He is expected to receive his first dose of treatment on 01/31/21 since his consultation with Dr. Sondra Come is scheduled for 02/02/2021.  We will see the patient back for follow-up visit in 2 weeks for evaluation before starting cycle #2.   The patient was advised to call immediately if he has any concerning symptoms in the interval. The patient voices understanding of current disease status and treatment options and is in agreement with the current care plan. All questions were answered.  The patient knows to call the clinic with any problems, questions or concerns. We can certainly see the patient much sooner if necessary      No orders of the defined types were placed in this encounter.    Azarias Chiou L Sukari Grist, PA-C 01/26/21  ADDENDUM: Hematology/Oncology Attending: I had a face-to-face encounter with the patient today.  I reviewed his records, scan and recommended his care plan.  This is a very pleasant 87 years old white male recently diagnosed with stage IIIb non-small cell lung cancer likely squamous cell carcinoma presented with central obstructing left upper lobe lung mass in addition to contralateral mediastinal lymphadenopathy diagnosed in April 2022 with PD-L1 expression of 0%. The patient had a PET scan as well as MRI of the brain performed recently.  I personally and independently reviewed the scan images and discussed the result and showed the images to the patient today. His PET scan showed no evidence for metastatic disease outside the chest.  The patient also has no evidence of metastatic disease to the brain but has some evidence of subacute infarct. I recommended for him to proceed with a course of concurrent chemoradiation as planned but we will delay the start of this treatment until next week until the patient sees Dr. Sondra Come for discussion  of the radiotherapy option. He will come back for follow-up visit in 2 weeks for evaluation and management of any adverse effect of his treatment. The patient was advised to call immediately if he has any other concerning symptoms in the interval. The total time spent in the appointment was 37 minutes. Disclaimer: This note was dictated with voice recognition software. Similar sounding words can inadvertently be transcribed and may be missed upon review. Eilleen Kempf, MD 01/26/21

## 2021-01-24 ENCOUNTER — Ambulatory Visit (HOSPITAL_COMMUNITY)
Admission: RE | Admit: 2021-01-24 | Discharge: 2021-01-24 | Disposition: A | Discharge status: Home / Self Care | Payer: Medicare Other | Source: Ambulatory Visit | Attending: Pulmonary Disease | Admitting: Pulmonary Disease

## 2021-01-24 ENCOUNTER — Encounter: Payer: Self-pay | Admitting: *Deleted

## 2021-01-24 ENCOUNTER — Other Ambulatory Visit: Payer: Self-pay

## 2021-01-24 ENCOUNTER — Encounter (HOSPITAL_COMMUNITY)
Admission: RE | Admit: 2021-01-24 | Discharge: 2021-01-24 | Disposition: A | Discharge status: Home / Self Care | Payer: Medicare Other | Source: Ambulatory Visit | Attending: Pulmonary Disease | Admitting: Pulmonary Disease

## 2021-01-24 DIAGNOSIS — N400 Enlarged prostate without lower urinary tract symptoms: Secondary | ICD-10-CM | POA: Diagnosis not present

## 2021-01-24 DIAGNOSIS — R590 Localized enlarged lymph nodes: Secondary | ICD-10-CM | POA: Diagnosis not present

## 2021-01-24 DIAGNOSIS — N132 Hydronephrosis with renal and ureteral calculous obstruction: Secondary | ICD-10-CM | POA: Diagnosis not present

## 2021-01-24 DIAGNOSIS — R918 Other nonspecific abnormal finding of lung field: Secondary | ICD-10-CM | POA: Insufficient documentation

## 2021-01-24 DIAGNOSIS — K8020 Calculus of gallbladder without cholecystitis without obstruction: Secondary | ICD-10-CM | POA: Diagnosis not present

## 2021-01-24 DIAGNOSIS — I700 Atherosclerosis of aorta: Secondary | ICD-10-CM | POA: Insufficient documentation

## 2021-01-24 DIAGNOSIS — I2510 Atherosclerotic heart disease of native coronary artery without angina pectoris: Secondary | ICD-10-CM | POA: Diagnosis not present

## 2021-01-24 LAB — GLUCOSE, CAPILLARY: Glucose-Capillary: 97 mg/dL (ref 70–99)

## 2021-01-24 MED ORDER — GADOBUTROL 1 MMOL/ML IV SOLN
8.0000 mL | Freq: Once | INTRAVENOUS | Status: AC | PRN
  Administered 2021-01-24: 8 mL via INTRAVENOUS

## 2021-01-24 MED ORDER — FLUDEOXYGLUCOSE F - 18 (FDG) INJECTION
8.0000 | Freq: Once | INTRAVENOUS | Status: AC | PRN
  Administered 2021-01-24: 8.9 via INTRAVENOUS

## 2021-01-24 NOTE — Progress Notes (Signed)
I followed up on Leon Lyons's schedule. He is set up for treatment plan.

## 2021-01-26 ENCOUNTER — Inpatient Hospital Stay (HOSPITAL_BASED_OUTPATIENT_CLINIC_OR_DEPARTMENT_OTHER): Payer: Medicare Other | Admitting: Physician Assistant

## 2021-01-26 ENCOUNTER — Inpatient Hospital Stay: Payer: Medicare Other

## 2021-01-26 ENCOUNTER — Other Ambulatory Visit: Payer: Self-pay

## 2021-01-26 VITALS — BP 137/61 | HR 62 | Temp 97.7°F | Resp 15 | Ht 68.5 in | Wt 183.5 lb

## 2021-01-26 DIAGNOSIS — C3492 Malignant neoplasm of unspecified part of left bronchus or lung: Secondary | ICD-10-CM | POA: Diagnosis not present

## 2021-01-26 DIAGNOSIS — Z5111 Encounter for antineoplastic chemotherapy: Secondary | ICD-10-CM | POA: Diagnosis not present

## 2021-01-26 LAB — CBC WITH DIFFERENTIAL (CANCER CENTER ONLY)
Abs Immature Granulocytes: 0.03 10*3/uL (ref 0.00–0.07)
Basophils Absolute: 0 10*3/uL (ref 0.0–0.1)
Basophils Relative: 0 %
Eosinophils Absolute: 0.1 10*3/uL (ref 0.0–0.5)
Eosinophils Relative: 2 %
HCT: 32.7 % — ABNORMAL LOW (ref 39.0–52.0)
Hemoglobin: 10.5 g/dL — ABNORMAL LOW (ref 13.0–17.0)
Immature Granulocytes: 1 %
Lymphocytes Relative: 37 %
Lymphs Abs: 2.2 10*3/uL (ref 0.7–4.0)
MCH: 32.4 pg (ref 26.0–34.0)
MCHC: 32.1 g/dL (ref 30.0–36.0)
MCV: 100.9 fL — ABNORMAL HIGH (ref 80.0–100.0)
Monocytes Absolute: 0.5 10*3/uL (ref 0.1–1.0)
Monocytes Relative: 8 %
Neutro Abs: 3.2 10*3/uL (ref 1.7–7.7)
Neutrophils Relative %: 52 %
Platelet Count: 342 10*3/uL (ref 150–400)
RBC: 3.24 MIL/uL — ABNORMAL LOW (ref 4.22–5.81)
RDW: 13.3 % (ref 11.5–15.5)
WBC Count: 6.1 10*3/uL (ref 4.0–10.5)
nRBC: 0 % (ref 0.0–0.2)

## 2021-01-26 LAB — CMP (CANCER CENTER ONLY)
ALT: 7 U/L (ref 0–44)
AST: 12 U/L — ABNORMAL LOW (ref 15–41)
Albumin: 2.7 g/dL — ABNORMAL LOW (ref 3.5–5.0)
Alkaline Phosphatase: 58 U/L (ref 38–126)
Anion gap: 8 (ref 5–15)
BUN: 15 mg/dL (ref 8–23)
CO2: 28 mmol/L (ref 22–32)
Calcium: 9.1 mg/dL (ref 8.9–10.3)
Chloride: 104 mmol/L (ref 98–111)
Creatinine: 1.02 mg/dL (ref 0.61–1.24)
GFR, Estimated: >60 mL/min (ref >60)
Glucose, Bld: 108 mg/dL — ABNORMAL HIGH (ref 70–99)
Potassium: 4.3 mmol/L (ref 3.5–5.1)
Sodium: 140 mmol/L (ref 135–145)
Total Bilirubin: <0.2 mg/dL — ABNORMAL LOW (ref 0.3–1.2)
Total Protein: 8.3 g/dL — ABNORMAL HIGH (ref 6.5–8.1)

## 2021-01-26 NOTE — Progress Notes (Signed)
Location of Tumor / Histology: LUL lung    The patient has very few years of smoking in his early life for around 5 years but quit 60 years ago.  He has been complaining of cough productive of whitish sputum for around 4 weeks started in the middle of February 2022.  The patient was seen by his primary care physician and requested a chest x-ray which was performed on December 27, 2020 and it showed masslike consolidation in the left perihilar region with atelectasis.  Biopsies of LUL lung (if applicable) revealed: see histology above  Tobacco/Marijuana/Snuff/ETOH use: The patient has very few years of smoking in his early life for around 5 years but quit 60 years ago.    Past/Anticipated interventions by cardiothoracic surgery, if any: Operation: Flexible video fiberoptic bronchoscopy with endobronchial ultrasound and biopsies.   Past/Anticipated interventions by medical oncology, if any:    Signs/Symptoms  Weight changes, if any: no  Respiratory complaints, if any: yes, productive cough.Denies hemoptysis, shortness of breath.  Hemoptysis, if any: no  Pain issues, if any:  no  SAFETY ISSUES:  Prior radiation? no  Pacemaker/ICD? no   Possible current pregnancy?no  Is the patient on methotrexate? no  Current Complaints / other details:  none  Vitals:   02/02/21 0939  BP: (!) 134/58  Pulse: 61  Resp: 20  Temp: 97.9 F (36.6 C)  SpO2: 98%  Weight: 180 lb 9.6 oz (81.9 kg)  Height: 5\' 8"  (1.727 m)

## 2021-01-31 ENCOUNTER — Telehealth: Payer: Self-pay

## 2021-01-31 NOTE — Telephone Encounter (Signed)
Called and spoke with pt's wife confirming pt's appts on 4/27. [name] verbalizes understanding and appreciative of call back.

## 2021-02-01 ENCOUNTER — Telehealth: Payer: Self-pay | Admitting: Internal Medicine

## 2021-02-01 NOTE — Telephone Encounter (Signed)
Scheduled per 04/20 los, patient has been called and voicemail was left.

## 2021-02-02 ENCOUNTER — Other Ambulatory Visit: Payer: Self-pay | Admitting: Internal Medicine

## 2021-02-02 ENCOUNTER — Encounter: Payer: Self-pay | Admitting: Radiation Oncology

## 2021-02-02 ENCOUNTER — Other Ambulatory Visit: Payer: Self-pay

## 2021-02-02 ENCOUNTER — Inpatient Hospital Stay: Payer: Medicare Other

## 2021-02-02 ENCOUNTER — Ambulatory Visit
Admission: RE | Admit: 2021-02-02 | Discharge: 2021-02-02 | Disposition: A | Discharge status: Home / Self Care | Payer: Medicare Other | Source: Ambulatory Visit | Attending: Radiation Oncology | Admitting: Radiation Oncology

## 2021-02-02 VITALS — BP 134/58 | HR 61 | Temp 97.9°F | Resp 20 | Ht 68.0 in | Wt 180.6 lb

## 2021-02-02 VITALS — BP 112/51 | HR 60 | Temp 97.5°F | Resp 17 | Wt 180.2 lb

## 2021-02-02 DIAGNOSIS — C3492 Malignant neoplasm of unspecified part of left bronchus or lung: Secondary | ICD-10-CM

## 2021-02-02 DIAGNOSIS — Z87891 Personal history of nicotine dependence: Secondary | ICD-10-CM | POA: Insufficient documentation

## 2021-02-02 DIAGNOSIS — I700 Atherosclerosis of aorta: Secondary | ICD-10-CM | POA: Insufficient documentation

## 2021-02-02 DIAGNOSIS — K8020 Calculus of gallbladder without cholecystitis without obstruction: Secondary | ICD-10-CM | POA: Insufficient documentation

## 2021-02-02 DIAGNOSIS — Z5111 Encounter for antineoplastic chemotherapy: Secondary | ICD-10-CM | POA: Diagnosis not present

## 2021-02-02 DIAGNOSIS — C3412 Malignant neoplasm of upper lobe, left bronchus or lung: Secondary | ICD-10-CM | POA: Insufficient documentation

## 2021-02-02 DIAGNOSIS — N132 Hydronephrosis with renal and ureteral calculous obstruction: Secondary | ICD-10-CM | POA: Insufficient documentation

## 2021-02-02 DIAGNOSIS — Z923 Personal history of irradiation: Secondary | ICD-10-CM | POA: Insufficient documentation

## 2021-02-02 DIAGNOSIS — C771 Secondary and unspecified malignant neoplasm of intrathoracic lymph nodes: Secondary | ICD-10-CM | POA: Insufficient documentation

## 2021-02-02 LAB — CBC WITH DIFFERENTIAL (CANCER CENTER ONLY)
Abs Immature Granulocytes: 0.03 10*3/uL (ref 0.00–0.07)
Basophils Absolute: 0.1 10*3/uL (ref 0.0–0.1)
Basophils Relative: 1 %
Eosinophils Absolute: 0.1 10*3/uL (ref 0.0–0.5)
Eosinophils Relative: 1 %
HCT: 33.9 % — ABNORMAL LOW (ref 39.0–52.0)
Hemoglobin: 10.7 g/dL — ABNORMAL LOW (ref 13.0–17.0)
Immature Granulocytes: 1 %
Lymphocytes Relative: 33 %
Lymphs Abs: 2.2 10*3/uL (ref 0.7–4.0)
MCH: 32.2 pg (ref 26.0–34.0)
MCHC: 31.6 g/dL (ref 30.0–36.0)
MCV: 102.1 fL — ABNORMAL HIGH (ref 80.0–100.0)
Monocytes Absolute: 0.6 10*3/uL (ref 0.1–1.0)
Monocytes Relative: 9 %
Neutro Abs: 3.7 10*3/uL (ref 1.7–7.7)
Neutrophils Relative %: 55 %
Platelet Count: 333 10*3/uL (ref 150–400)
RBC: 3.32 MIL/uL — ABNORMAL LOW (ref 4.22–5.81)
RDW: 13.5 % (ref 11.5–15.5)
WBC Count: 6.6 10*3/uL (ref 4.0–10.5)
nRBC: 0 % (ref 0.0–0.2)

## 2021-02-02 LAB — CMP (CANCER CENTER ONLY)
ALT: 6 U/L (ref 0–44)
AST: 12 U/L — ABNORMAL LOW (ref 15–41)
Albumin: 2.8 g/dL — ABNORMAL LOW (ref 3.5–5.0)
Alkaline Phosphatase: 53 U/L (ref 38–126)
Anion gap: 9 (ref 5–15)
BUN: 18 mg/dL (ref 8–23)
CO2: 26 mmol/L (ref 22–32)
Calcium: 9.3 mg/dL (ref 8.9–10.3)
Chloride: 104 mmol/L (ref 98–111)
Creatinine: 0.96 mg/dL (ref 0.61–1.24)
GFR, Estimated: >60 mL/min (ref >60)
Glucose, Bld: 86 mg/dL (ref 70–99)
Potassium: 4 mmol/L (ref 3.5–5.1)
Sodium: 139 mmol/L (ref 135–145)
Total Bilirubin: 0.2 mg/dL — ABNORMAL LOW (ref 0.3–1.2)
Total Protein: 8.6 g/dL — ABNORMAL HIGH (ref 6.5–8.1)

## 2021-02-02 MED ORDER — SODIUM CHLORIDE 0.9 % IV SOLN
Freq: Once | INTRAVENOUS | Status: AC
  Filled 2021-02-02: qty 250

## 2021-02-02 MED ORDER — PALONOSETRON HCL INJECTION 0.25 MG/5ML
INTRAVENOUS | Status: AC
  Filled 2021-02-02: qty 5

## 2021-02-02 MED ORDER — FAMOTIDINE IN NACL 20-0.9 MG/50ML-% IV SOLN
20.0000 mg | Freq: Once | INTRAVENOUS | Status: AC
  Administered 2021-02-02: 20 mg via INTRAVENOUS

## 2021-02-02 MED ORDER — SODIUM CHLORIDE 0.9 % IV SOLN
45.0000 mg/m2 | Freq: Once | INTRAVENOUS | Status: AC
  Administered 2021-02-02: 90 mg via INTRAVENOUS
  Filled 2021-02-02: qty 15

## 2021-02-02 MED ORDER — SODIUM CHLORIDE 0.9 % IV SOLN
20.0000 mg | Freq: Once | INTRAVENOUS | Status: AC
  Administered 2021-02-02: 20 mg via INTRAVENOUS
  Filled 2021-02-02: qty 20

## 2021-02-02 MED ORDER — DIPHENHYDRAMINE HCL 50 MG/ML IJ SOLN
50.0000 mg | Freq: Once | INTRAMUSCULAR | Status: AC
  Administered 2021-02-02: 50 mg via INTRAVENOUS

## 2021-02-02 MED ORDER — DIPHENHYDRAMINE HCL 50 MG/ML IJ SOLN
INTRAMUSCULAR | Status: AC
  Filled 2021-02-02: qty 1

## 2021-02-02 MED ORDER — SODIUM CHLORIDE 0.9 % IV SOLN
174.0000 mg | Freq: Once | INTRAVENOUS | Status: AC
  Administered 2021-02-02: 170 mg via INTRAVENOUS
  Filled 2021-02-02: qty 17

## 2021-02-02 MED ORDER — FAMOTIDINE IN NACL 20-0.9 MG/50ML-% IV SOLN
INTRAVENOUS | Status: AC
  Filled 2021-02-02: qty 50

## 2021-02-02 MED ORDER — PALONOSETRON HCL INJECTION 0.25 MG/5ML
0.2500 mg | Freq: Once | INTRAVENOUS | Status: AC
  Administered 2021-02-02: 0.25 mg via INTRAVENOUS

## 2021-02-02 NOTE — Progress Notes (Signed)
Radiation Oncology         [phone] ________________________________  Initial Outpatient Consultation  Name: Leon Lyons MRN: [id number]  Date: 02/02/2021  DOB: [birth year]  IP:[id number], Olin Hauser, MD  [name], MD   REFERRING PHYSICIAN: [name], MD  DIAGNOSIS: The encounter diagnosis was Non-small cell carcinoma of left lung, stage 3 ([place]).  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 adult who is seen as a courtesy of Dr. Julien Nordmann for an opinion concerning radiation therapy as part of management for his recently diagnosed lung cancer. Today, he is accompanied by his wife. The patient presented to PCP for evaluation of four-week history of productive cough. Chest x-ray on 12/27/2020 showed a mass-like consolidation in the left perihilar region with atelectasis.  Chest CT scan was performed on 12/28/2020 and revealed 4.0 x 2.4 cm ill-defined left hilar mass that resulted in occlusion of the left upper lobe bronchus and associated post-obstructive atelectasis, highly concerning for malignancy. There was also noted to be an enlarged left paratracheal lymph node that was concerning for metastatic disease.  The patient underwent a flexible video fiberoptic bronchoscopy with endobronchial ultrasound and biopsies on 01/07/2021 under the care of Dr. Valeta Harms. Cytology from the procedure revealed malignant cells consistent with non-small cell carcinoma of the left upper lobe biopsies and brushing as well as the fine needle aspiration of a lymph node.  PET scan on 01/24/2021 revealed a centrally obstructing hypermetabolic left upper lobe mass with hypermetabolic adenopathy extending to the contralateral mediastinum. Findings were most consistent with stage IIIB primary bronchogenic carcinoma. There was also noted to be a hypermetabolic right para-midline soft tissue nodule posterior to  the L2 vertebral body that was indeterminate. Findings could be degenerative but metastasis was difficult to exclude. Additionally, there was noted to be a peripherally calcified cystic lesion in the small bowel mesentery without associated hypermetabolism, favoring a benign lesion. Furthermore, there was asymmetric hypermetabolism in the left lobe of the thyroid.  MRI of the brain on 01/24/2021 revealed small left occipital subacute infract and probable small meningioma along the falx, but no evidence of intracranial metastatic disease.   PREVIOUS RADIATION THERAPY: No  PAST MEDICAL HISTORY: History reviewed. No pertinent past medical history.  PAST SURGICAL HISTORY: Past Surgical History:  Procedure Laterality Date  . APPENDECTOMY    . BRONCHIAL BIOPSY  01/07/2021   Procedure: BRONCHIAL BIOPSIES;  Surgeon: Garner Nash, DO;  Location: [facility];  Service: Pulmonary;;  . BRONCHIAL BRUSHINGS  01/07/2021   Procedure: BRONCHIAL BRUSHINGS;  Surgeon: Garner Nash, DO;  Location: [place] [facility];  Service: Pulmonary;;  . BRONCHIAL NEEDLE ASPIRATION BIOPSY  01/07/2021   Procedure: BRONCHIAL NEEDLE ASPIRATION BIOPSIES;  Surgeon: Garner Nash, DO;  Location: [place];  Service: Pulmonary;;  . BRONCHIAL WASHINGS  01/07/2021   Procedure: BRONCHIAL WASHINGS;  Surgeon: Garner Nash, DO;  Location: [place];  Service: Pulmonary;;  . IRRIGATION AND DEBRIDEMENT SEBACEOUS CYST    . VIDEO BRONCHOSCOPY WITH ENDOBRONCHIAL ULTRASOUND Bilateral 01/07/2021   Procedure: VIDEO BRONCHOSCOPY WITH ENDOBRONCHIAL ULTRASOUND;  Surgeon: Garner Nash, DO;  Location: [facility];  Service: Pulmonary;  Laterality: Bilateral;  possible need for radial ultrasound and fluoro     FAMILY HISTORY: History reviewed. No pertinent family history.  SOCIAL HISTORY:  Social History   Tobacco Use  . Smoking status: Former Smoker    Years: 5.00    Types:  Cigarettes    Quit date: 60    Years since quitting:  60.3  . Smokeless tobacco: Never Used  . Tobacco comment: light/occasional smoker back in early 1960's  Vaping Use  . Vaping Use: Never used    ALLERGIES: No Known Allergies  MEDICATIONS:  Current Outpatient Medications  Medication Sig Dispense Refill  . acetaminophen (TYLENOL) 325 MG tablet Take 650 mg by mouth every 6 (six) hours as needed for moderate pain or headache.    . Ascorbic Acid (VITAMIN C) 1000 MG tablet Take 1,000 mg by mouth once a week.    Marland Kitchen Dextromethorphan-guaiFENesin 5-100 MG/5ML LIQD Take by mouth.    . cholecalciferol (VITAMIN D3) 25 MCG (1000 UNIT) tablet Take 1,000 Units by mouth once a week. (Patient not taking: Reported on 02/02/2021)    . guaiFENesin (MUCINEX) 600 MG 12 hr tablet Take 600 mg by mouth 1 day or 1 dose.    . Magnesium 400 MG CAPS Take 400 mg by mouth once a week. (Patient not taking: Reported on 02/02/2021)    . Multiple Vitamin (MULTIVITAMIN WITH MINERALS) TABS tablet Take 1 tablet by mouth once a week. (Patient not taking: Reported on 02/02/2021)    . Multiple Vitamins-Minerals (AIRBORNE PO) Take 1 tablet by mouth once a week. (Patient not taking: Reported on 02/02/2021)    . naproxen sodium (ALEVE) 220 MG tablet Take 220 mg by mouth daily as needed (pain). (Patient not taking: Reported on 02/02/2021)    . Omega-3 Fatty Acids (FISH OIL ULTRA) 1400 MG CAPS Take 1,400 mg by mouth once a week. (Patient not taking: Reported on 02/02/2021)    . prochlorperazine (COMPAZINE) 10 MG tablet Take 1 tablet (10 mg total) by mouth every 6 (six) hours as needed for nausea or vomiting. (Patient not taking: Reported on 02/02/2021) 30 tablet 0  . vitamin E 180 MG (400 UNITS) capsule Take 400 Units by mouth once a week. (Patient not taking: Reported on 02/02/2021)     No current facility-administered medications for this encounter.    REVIEW OF SYSTEMS:  A 10+ POINT REVIEW OF SYSTEMS WAS OBTAINED including neurology, dermatology, psychiatry, cardiac, respiratory, lymph,  extremities, GI, GU, musculoskeletal, constitutional, reproductive, HEENT.  He reports increased coughing especially in the morning.  He denies any pain within the chest area or significant breathing problems.  He reports being able to ambulate a flight of stairs without difficulty.  He denies any hemoptysis.   PHYSICAL EXAM:  height is 5\' 8"  (1.727 m) and weight is 180 lb 9.6 oz (81.9 kg). His temperature is 97.9 F (36.6 C). His blood pressure is 134/58 (abnormal) and his pulse is 61. His respiration is 20 and oxygen saturation is 98%.   General: Alert and oriented, in no acute distress HEENT: Head is normocephalic. Extraocular movements are intact. Oropharynx is clear.  Dentures along the maxillary area Neck: Neck is supple, no palpable cervical or supraclavicular lymphadenopathy. Heart: Regular in rate and rhythm with no murmurs, rubs, or gallops. Chest: Clear to auscultation bilaterally, with no rhonchi, wheezes, or rales. Abdomen: Soft, nontender, nondistended, with no rigidity or guarding. Extremities: No cyanosis or edema. Lymphatics: see Neck Exam Skin: No concerning lesions. Musculoskeletal: symmetric strength and muscle tone throughout. Neurologic: Cranial nerves II through XII are grossly intact. No obvious focalities. Speech is fluent. Coordination is intact. Psychiatric: Judgment and insight are intact. Affect is appropriate.   ECOG = 1  0 - Asymptomatic (Fully active, able to carry on all predisease activities  without restriction)  1 - Symptomatic but completely ambulatory (Restricted in physically strenuous activity but ambulatory and able to carry out work of a light or sedentary nature. For example, light housework, office work)  2 - Symptomatic, <50% in bed during the day (Ambulatory and capable of all self care but unable to carry out any work activities. Up and about more than 50% of waking hours)  3 - Symptomatic, >50% in bed, but not bedbound (Capable of only limited  self-care, confined to bed or chair 50% or more of waking hours)  4 - Bedbound (Completely disabled. Cannot carry on any self-care. Totally confined to bed or chair)  5 - Death   Eustace Pen MM, Creech RH, Tormey DC, et al. [phone]). "Toxicity and response criteria of the [facility]". [place] Oncol. 5 (6): 649-55  LABORATORY DATA:  Lab Results  Component Value Date   WBC 6.6 02/02/2021   HGB 10.7 (L) 02/02/2021   HCT 33.9 (L) 02/02/2021   MCV 102.1 (H) 02/02/2021   PLT 333 02/02/2021   NEUTROABS 3.7 02/02/2021   Lab Results  Component Value Date   NA 139 02/02/2021   K 4.0 02/02/2021   CL 104 02/02/2021   CO2 26 02/02/2021   GLUCOSE 86 02/02/2021   CREATININE 0.96 02/02/2021   CALCIUM 9.3 02/02/2021      RADIOGRAPHY: MR BRAIN W WO CONTRAST  Result Date: 01/24/2021 CLINICAL DATA:  Lung cancer, staging EXAM: MRI HEAD WITHOUT AND WITH CONTRAST TECHNIQUE: Multiplanar, multiecho pulse sequences of the brain and surrounding structures were obtained without and with intravenous contrast. CONTRAST:  29mL GADAVIST GADOBUTROL 1 MMOL/ML IV SOLN COMPARISON:  None. FINDINGS: Brain: There is no acute infarction or intracranial hemorrhage. A small cortical/subcortical focus of diffusion hyperintensity with ADC isointensity in the left occipital lobe is present without associated enhancement. Suspect very small left falcine meningioma measuring 4 mm (series 20, image 136; series 11, image 46). There is no mass effect or edema. There is no hydrocephalus or extra-axial fluid collection. Ventricles and sulci are within normal limits in size and configuration. Patchy foci of T2 hyperintensity in the supratentorial and pontine white matter are nonspecific but may reflect mild to moderate chronic microvascular ischemic changes. 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 Few small chronic cerebral white matter infarcts. No abnormal enhancement. Vascular: Major vessel  flow voids at the skull base are preserved. Skull and upper cervical spine: Normal marrow signal is preserved. Sinuses/Orbits: Minor mucosal thickening.  Orbits are unremarkable. Other: Sella is unremarkable.  Mastoid air cells are clear. IMPRESSION: No evidence of intracranial metastatic disease. Small left occipital subacute infarct. Probable small meningioma along the falx. Electronically Signed   By: Macy Mis M.D.   On: 01/24/2021 10:09   NM PET Image Initial (PI) Skull Base To Thigh  Result Date: 01/24/2021 CLINICAL DATA:  Initial treatment strategy for lung cancer. EXAM: NUCLEAR MEDICINE PET SKULL BASE TO THIGH TECHNIQUE: 8.9 mCi F-18 FDG was injected intravenously. Full-ring PET imaging was performed from the skull base to thigh after the radiotracer. CT data was obtained and used for attenuation correction and anatomic localization. Fasting blood glucose: 97 mg/dl COMPARISON:  CT chest 12/28/2020. FINDINGS: Mediastinal blood pool activity: SUV max 1.9 Liver activity: SUV max NA NECK: No abnormal hypermetabolism. Incidental CT findings: None. CHEST: Mildly asymmetric hypermetabolism in the left lobe of the thyroid, SUV max 3.6. Hypermetabolic mediastinal lymph nodes are seen bilaterally. Index right paratracheal lymph node measures  12 mm (4/60) with an SUV max of 15.1. Hypermetabolic left hilar adenopathy with a central left upper lobe hypermetabolic mass best measured on 12/28/2020, SUV max of 11.3. Incidental CT findings: Atherosclerotic calcification of the aorta, aortic valve and coronary arteries. Aberrant right subclavian artery. Heart is enlarged. No pericardial effusion. Obstruction of the left upper lobe bronchus with post obstructive collapse/consolidation in the left upper lobe, increased from 12/28/2020. ABDOMEN/PELVIS: No abnormal hypermetabolism in the liver, adrenal glands, spleen or pancreas. No hypermetabolic lymph nodes. Incidental CT findings: Liver is unremarkable. Stones are  seen in the gallbladder. Slight nodular thickening of both adrenal glands. Stones are seen in the right kidney. Marked atrophy in the left kidney with hydronephrosis the level of the ureteropelvic junction. Spleen and pancreas are unremarkable. Proximal gastric wall thickening. Scattered ascites. Bladder wall thickening. Prostate is enlarged. Cystic structure in the small bowel mesentery has areas of peripheral calcification, measuring 5.8 x 6.2 cm. There are low-density fluid collection along the subcutaneous ventral midline abdominal wall, which may be due to prior surgery. Atherosclerotic calcification of the aorta. SKELETON: There may be a 13 mm right paramidline soft tissue nodule posterior to the L2 vertebral body (4/123) with an SUV max of 5.0. No hypermetabolic osseous lesions. Incidental CT findings: Degenerative changes in the spine. IMPRESSION: 1. Centrally obstructing hypermetabolic left upper lobe mass with hypermetabolic adenopathy extending to the contralateral mediastinum. Findings are most consistent with stage IIIB primary bronchogenic carcinoma. 2. Hypermetabolic right paramidline soft tissue nodule posterior to the L2 vertebral body, indeterminate. Finding may be degenerative. Difficult to exclude a metastasis. 3. Peripherally calcified cystic lesion in the small bowel mesentery without associated hypermetabolism, favoring a benign lesion. 4. Asymmetric hypermetabolism in the left lobe of the thyroid. Consider thyroid ultrasound in further evaluation, as clinically indicated. 5. Cholelithiasis. 6. Right renal stones. 7. Left renal atrophy with hydronephrosis, suggesting a chronic UPJ obstruction. 8. Proximal gastric wall thickening. 9. Enlarged prostate. 10. Aortic atherosclerosis (ICD10-I70.0). Coronary artery calcification. Electronically Signed   By: Lorin Picket M.D.   On: 01/24/2021 10:46      IMPRESSION: Stage IIIB non-small cell carcinoma of the left upper lobe, squamous cell  carcinoma  The patient would be a good candidate for definitive course of radiation therapy along with radiosensitizing chemotherapy.  Today, I talked to the patient and his wife about the findings and work-up thus far.  We discussed the natural history of lung cancer and general treatment, highlighting the role of radiotherapy in the management.  We discussed the available radiation techniques, and focused on the details of logistics and delivery.  We reviewed the anticipated acute and late sequelae associated with radiation in this setting.  The patient was encouraged to ask questions that I answered to the best of my ability.  A patient consent form was discussed and signed.  We retained a copy for our records.  The patient would like to proceed with radiation and will be scheduled for CT simulation.  PLAN: Patient will return for CT simulation on April 29 at 8:30 AM.  Treatments to begin next Thursday concomitant with his second cycle of radiosensitizing chemotherapy.  Anticipate 6 weeks of radiation therapy.  The patient will then likely proceed with immunotherapy approximately a month later.  May need to consider IMRT given the contralateral mediastinal nodal metastasis and the difficulty in covering this area with 3D planning.  Total time spent in this encounter was 60 minutes which included reviewing the patient's most recent consultations, follow-ups,  chest x-ray, chest CT scan, PET scan, MRI of brain, physical examination, and documentation.  ------------------------------------------------  Blair Promise, PhD, MD  This document serves as a record of services personally performed by Gery Pray, MD. It was created on his behalf by Clerance Lav, a trained medical scribe. The creation of this record is based on the scribe's personal observations and the provider's statements to them. This document has been checked and approved by the attending provider.

## 2021-02-02 NOTE — Patient Instructions (Signed)
[facility]    Discharge Instructions: Thank you for choosing [facility] to provide your oncology and hematology care.   If you have a lab appointment with the [facility], please go directly to the [facility] and check in at the registration area.   Wear comfortable clothing and clothing appropriate for easy access to any Portacath or PICC line.   We strive to give you quality time with your provider. You may need to reschedule your appointment if you arrive late (15 or more minutes).  Arriving late affects you and other patients whose appointments are after yours.  Also, if you miss three or more appointments without notifying the office, you may be dismissed from the clinic at the provider's discretion.      For prescription refill requests, have your pharmacy contact our office and allow 72 hours for refills to be completed.    Today you received the following chemotherapy and/or immunotherapy agents paclitaxel, carboplatin   To help prevent nausea and vomiting after your treatment, we encourage you to take your nausea medication as directed.  BELOW ARE SYMPTOMS THAT SHOULD BE REPORTED IMMEDIATELY: . *FEVER GREATER THAN 100.4 F (38 C) OR HIGHER . *CHILLS OR SWEATING . *NAUSEA AND VOMITING THAT IS NOT CONTROLLED WITH YOUR NAUSEA MEDICATION . *UNUSUAL SHORTNESS OF BREATH . *UNUSUAL BRUISING OR BLEEDING . *URINARY PROBLEMS (pain or burning when urinating, or frequent urination) . *BOWEL PROBLEMS (unusual diarrhea, constipation, pain near the anus) . TENDERNESS IN MOUTH AND THROAT WITH OR WITHOUT PRESENCE OF ULCERS (sore throat, sores in mouth, or a toothache) . UNUSUAL RASH, SWELLING OR PAIN  . UNUSUAL VAGINAL DISCHARGE OR ITCHING   Items with * indicate a potential emergency and should be followed up as soon as possible or go to the Emergency Department if any problems should occur.  Please show the CHEMOTHERAPY ALERT CARD or  IMMUNOTHERAPY ALERT CARD at check-in to the Emergency Department and triage nurse.  Should you have questions after your visit or need to cancel or reschedule your appointment, please contact [facility]  Dept: [phone]  and follow the prompts.  Office hours are 8:00 a.m. to 4:30 p.m. Monday - Friday. Please note that voicemails left after 4:00 p.m. may not be returned until the following business day.  We are closed weekends and major holidays. You have access to a nurse at all times for urgent questions. Please call the main number to the clinic Dept: [phone] and follow the prompts.   For any non-urgent questions, you may also contact your provider using MyChart. We now offer e-Visits for anyone 50 and older to request care online for non-urgent symptoms. For details visit [email or web address].   Also download the MyChart app! Go to the app store, search "MyChart", open the app, select [facility], and log in with your MyChart username and password.  Due to Covid, a mask is required upon entering the hospital/clinic. If you do not have a mask, one will be given to you upon arrival. For doctor visits, patients may have 1 support person aged 71 or older with them. For treatment visits, patients cannot have anyone with them due to current Covid guidelines and our immunocompromised population.   Paclitaxel injection What is this medicine? PACLITAXEL (PAK li TAX el) is a chemotherapy drug. It targets fast dividing cells, like cancer cells, and causes these cells to die. This medicine is used to treat ovarian cancer, breast cancer,  lung cancer, Kaposi's sarcoma, and other cancers. This medicine may be used for other purposes; ask your health care provider or pharmacist if you have questions. COMMON BRAND NAME(S): Onxol, Taxol What should I tell my health care provider before I take this medicine? They need to know if you have any of these conditions:  history  of irregular heartbeat  liver disease  low blood counts, like low white cell, platelet, or red cell counts  lung or breathing disease, like asthma  tingling of the fingers or toes, or other nerve disorder  an unusual or allergic reaction to paclitaxel, alcohol, polyoxyethylated castor oil, other chemotherapy, other medicines, foods, dyes, or preservatives  pregnant or trying to get pregnant  breast-feeding How should I use this medicine? This drug is given as an infusion into a vein. It is administered in a hospital or clinic by a specially trained health care professional. Talk to your pediatrician regarding the use of this medicine in children. Special care may be needed. Overdosage: If you think you have taken too much of this medicine contact a poison control center or emergency room at once. NOTE: This medicine is only for you. Do not share this medicine with others. What if I miss a dose? It is important not to miss your dose. Call your doctor or health care professional if you are unable to keep an appointment. What may interact with this medicine? Do not take this medicine with any of the following medications:  live virus vaccines This medicine may also interact with the following medications:  antiviral medicines for hepatitis, HIV or AIDS  certain antibiotics like erythromycin and clarithromycin  certain medicines for fungal infections like ketoconazole and itraconazole  certain medicines for seizures like carbamazepine, phenobarbital, phenytoin  gemfibrozil  nefazodone  rifampin  St. John's wort This list may not describe all possible interactions. Give your health care provider a list of all the medicines, herbs, non-prescription drugs, or dietary supplements you use. Also tell them if you smoke, drink alcohol, or use illegal drugs. Some items may interact with your medicine. What should I watch for while using this medicine? Your condition will be monitored  carefully while you are receiving this medicine. You will need important blood work done while you are taking this medicine. This medicine can cause serious allergic reactions. To reduce your risk you will need to take other medicine(s) before treatment with this medicine. If you experience allergic reactions like skin rash, itching or hives, swelling of the face, lips, or tongue, tell your doctor or health care professional right away. In some cases, you may be given additional medicines to help with side effects. Follow all directions for their use. This drug may make you feel generally unwell. This is not uncommon, as chemotherapy can affect healthy cells as well as cancer cells. Report any side effects. Continue your course of treatment even though you feel ill unless your doctor tells you to stop. Call your doctor or health care professional for advice if you get a fever, chills or sore throat, or other symptoms of a cold or flu. Do not treat yourself. This drug decreases your body's ability to fight infections. Try to avoid being around people who are sick. This medicine may increase your risk to bruise or bleed. Call your doctor or health care professional if you notice any unusual bleeding. Be careful brushing and flossing your teeth or using a toothpick because you may get an infection or bleed more easily. If you  have any dental work done, tell your dentist you are receiving this medicine. Avoid taking products that contain aspirin, acetaminophen, ibuprofen, naproxen, or ketoprofen unless instructed by your doctor. These medicines may hide a fever. Do not become pregnant while taking this medicine. Women should inform their doctor if they wish to become pregnant or think they might be pregnant. There is a potential for serious side effects to an unborn child. Talk to your health care professional or pharmacist for more information. Do not breast-feed an infant while taking this medicine. Men are  advised not to father a child while receiving this medicine. This product may contain alcohol. Ask your pharmacist or healthcare provider if this medicine contains alcohol. Be sure to tell all healthcare providers you are taking this medicine. Certain medicines, like metronidazole and disulfiram, can cause an unpleasant reaction when taken with alcohol. The reaction includes flushing, headache, nausea, vomiting, sweating, and increased thirst. The reaction can last from 30 minutes to several hours. What side effects may I notice from receiving this medicine? Side effects that you should report to your doctor or health care professional as soon as possible:  allergic reactions like skin rash, itching or hives, swelling of the face, lips, or tongue  breathing problems  changes in vision  fast, irregular heartbeat  high or low blood pressure  mouth sores  pain, tingling, numbness in the hands or feet  signs of decreased platelets or bleeding - bruising, pinpoint red spots on the skin, black, tarry stools, blood in the urine  signs of decreased red blood cells - unusually weak or tired, feeling faint or lightheaded, falls  signs of infection - fever or chills, cough, sore throat, pain or difficulty passing urine  signs and symptoms of liver injury like dark yellow or brown urine; general ill feeling or flu-like symptoms; light-colored stools; loss of appetite; nausea; right upper belly pain; unusually weak or tired; yellowing of the eyes or skin  swelling of the ankles, feet, hands  unusually slow heartbeat Side effects that usually do not require medical attention (report to your doctor or health care professional if they continue or are bothersome):  diarrhea  hair loss  loss of appetite  muscle or joint pain  nausea, vomiting  pain, redness, or irritation at site where injected  tiredness This list may not describe all possible side effects. Call your doctor for medical  advice about side effects. You may report side effects to FDA at 1-800-FDA-1088. Where should I keep my medicine? This drug is given in a hospital or clinic and will not be stored at home. NOTE: This sheet is a summary. It may not cover all possible information. If you have questions about this medicine, talk to your doctor, pharmacist, or health care provider.  2021 Elsevier/Gold Standard (2019-08-27 13:37:23)  Carboplatin injection What is this medicine? CARBOPLATIN (KAR boe pla tin) is a chemotherapy drug. It targets fast dividing cells, like cancer cells, and causes these cells to die. This medicine is used to treat ovarian cancer and many other cancers. This medicine may be used for other purposes; ask your health care provider or pharmacist if you have questions. COMMON BRAND NAME(S): Paraplatin What should I tell my health care provider before I take this medicine? They need to know if you have any of these conditions:  blood disorders  hearing problems  kidney disease  recent or ongoing radiation therapy  an unusual or allergic reaction to carboplatin, cisplatin, other chemotherapy, other medicines, foods,  dyes, or preservatives  pregnant or trying to get pregnant  breast-feeding How should I use this medicine? This drug is usually given as an infusion into a vein. It is administered in a hospital or clinic by a specially trained health care professional. Talk to your pediatrician regarding the use of this medicine in children. Special care may be needed. Overdosage: If you think you have taken too much of this medicine contact a poison control center or emergency room at once. NOTE: This medicine is only for you. Do not share this medicine with others. What if I miss a dose? It is important not to miss a dose. Call your doctor or health care professional if you are unable to keep an appointment. What may interact with this medicine?  medicines for seizures  medicines  to increase blood counts like filgrastim, pegfilgrastim, sargramostim  some antibiotics like amikacin, gentamicin, neomycin, streptomycin, tobramycin  vaccines Talk to your doctor or health care professional before taking any of these medicines:  acetaminophen  aspirin  ibuprofen  ketoprofen  naproxen This list may not describe all possible interactions. Give your health care provider a list of all the medicines, herbs, non-prescription drugs, or dietary supplements you use. Also tell them if you smoke, drink alcohol, or use illegal drugs. Some items may interact with your medicine. What should I watch for while using this medicine? Your condition will be monitored carefully while you are receiving this medicine. You will need important blood work done while you are taking this medicine. This drug may make you feel generally unwell. This is not uncommon, as chemotherapy can affect healthy cells as well as cancer cells. Report any side effects. Continue your course of treatment even though you feel ill unless your doctor tells you to stop. In some cases, you may be given additional medicines to help with side effects. Follow all directions for their use. Call your doctor or health care professional for advice if you get a fever, chills or sore throat, or other symptoms of a cold or flu. Do not treat yourself. This drug decreases your body's ability to fight infections. Try to avoid being around people who are sick. This medicine may increase your risk to bruise or bleed. Call your doctor or health care professional if you notice any unusual bleeding. Be careful brushing and flossing your teeth or using a toothpick because you may get an infection or bleed more easily. If you have any dental work done, tell your dentist you are receiving this medicine. Avoid taking products that contain aspirin, acetaminophen, ibuprofen, naproxen, or ketoprofen unless instructed by your doctor. These medicines  may hide a fever. Do not become pregnant while taking this medicine. Women should inform their doctor if they wish to become pregnant or think they might be pregnant. There is a potential for serious side effects to an unborn child. Talk to your health care professional or pharmacist for more information. Do not breast-feed an infant while taking this medicine. What side effects may I notice from receiving this medicine? Side effects that you should report to your doctor or health care professional as soon as possible:  allergic reactions like skin rash, itching or hives, swelling of the face, lips, or tongue  signs of infection - fever or chills, cough, sore throat, pain or difficulty passing urine  signs of decreased platelets or bleeding - bruising, pinpoint red spots on the skin, black, tarry stools, nosebleeds  signs of decreased red blood cells - unusually  weak or tired, fainting spells, lightheadedness  breathing problems  changes in hearing  changes in vision  chest pain  high blood pressure  low blood counts - This drug may decrease the number of white blood cells, red blood cells and platelets. You may be at increased risk for infections and bleeding.  nausea and vomiting  pain, swelling, redness or irritation at the injection site  pain, tingling, numbness in the hands or feet  problems with balance, talking, walking  trouble passing urine or change in the amount of urine Side effects that usually do not require medical attention (report to your doctor or health care professional if they continue or are bothersome):  hair loss  loss of appetite  metallic taste in the mouth or changes in taste This list may not describe all possible side effects. Call your doctor for medical advice about side effects. You may report side effects to FDA at 1-800-FDA-1088. Where should I keep my medicine? This drug is given in a hospital or clinic and will not be stored at  home. NOTE: This sheet is a summary. It may not cover all possible information. If you have questions about this medicine, talk to your doctor, pharmacist, or health care provider.  2021 Elsevier/Gold Standard (2007-12-31 14:38:05)

## 2021-02-02 NOTE — Progress Notes (Signed)
See MD note for nursing evaluation. °

## 2021-02-03 ENCOUNTER — Telehealth: Payer: Self-pay | Admitting: *Deleted

## 2021-02-04 ENCOUNTER — Ambulatory Visit
Admission: RE | Admit: 2021-02-04 | Discharge: 2021-02-04 | Disposition: A | Discharge status: Home / Self Care | Payer: Medicare Other | Source: Ambulatory Visit | Attending: Radiation Oncology | Admitting: Radiation Oncology

## 2021-02-04 ENCOUNTER — Emergency Department (HOSPITAL_COMMUNITY)
Admission: EM | Admit: 2021-02-04 | Discharge: 2021-02-04 | Disposition: A | Discharge status: Home / Self Care | Payer: Medicare Other | Source: Home / Self Care | Attending: Emergency Medicine | Admitting: Emergency Medicine

## 2021-02-04 ENCOUNTER — Encounter (HOSPITAL_COMMUNITY): Payer: Self-pay

## 2021-02-04 ENCOUNTER — Other Ambulatory Visit: Payer: Self-pay

## 2021-02-04 DIAGNOSIS — Z87891 Personal history of nicotine dependence: Secondary | ICD-10-CM | POA: Insufficient documentation

## 2021-02-04 DIAGNOSIS — C3492 Malignant neoplasm of unspecified part of left bronchus or lung: Secondary | ICD-10-CM

## 2021-02-04 DIAGNOSIS — Z85118 Personal history of other malignant neoplasm of bronchus and lung: Secondary | ICD-10-CM | POA: Insufficient documentation

## 2021-02-04 DIAGNOSIS — K5900 Constipation, unspecified: Secondary | ICD-10-CM | POA: Diagnosis not present

## 2021-02-04 DIAGNOSIS — C3412 Malignant neoplasm of upper lobe, left bronchus or lung: Secondary | ICD-10-CM | POA: Diagnosis not present

## 2021-02-04 DIAGNOSIS — K5901 Slow transit constipation: Secondary | ICD-10-CM

## 2021-02-04 MED ORDER — POLYETHYLENE GLYCOL 3350 17 G PO PACK: 17.0000 g | PACK | Freq: Every day | ORAL | 0 refills | Status: DC

## 2021-02-04 MED ORDER — POLYETHYLENE GLYCOL 3350 17 G PO PACK
17.0000 g | PACK | Freq: Every day | ORAL | Status: DC
  Administered 2021-02-04: 17 g via ORAL
  Filled 2021-02-04: qty 1

## 2021-02-04 MED ORDER — POLYETHYLENE GLYCOL 3350 17 G PO PACK: 17.0000 g | PACK | Freq: Every day | ORAL | Status: DC

## 2021-02-04 NOTE — ED Triage Notes (Signed)
Pt reports no bowel movement since 4/24. Called oncologist office and directed to ER.

## 2021-02-05 NOTE — Progress Notes (Deleted)
[facility] OFFICE PROGRESS NOTE  [name], MD [address] [place] [id number]  DIAGNOSIS: Stage IIIB non-small cell lung cancer, morphology and immunophenotype is consistent with poorly differentiated squamous cell carcinoma.  He presented with a centrally obstructing left upper lobe mass with contralateral mediastinal lymphadenopathy.  There is also an indeterminate right paramidline soft tissue nodule in the L2 vertebrae which is indeterminate.  He was diagnosed in April 2022.  PDL1 Expression: 0%  PRIOR THERAPY: None   CURRENT THERAPY: Weekly concurrent chemoradiation with carboplatin for AUC of 2 and paclitaxel 45 mg per metered squared.  First dose on 02/02/21.   INTERVAL HISTORY: Leon Lyons 87 y.o. adult returns to the clinic today for follow-up visit.  Accompanied by his wife.  The patient is feeling fairly well today without any concerning complaints.  In the interval since he was last seen in clinic he presented to the emergency room on 02/04/2021 for the chief complaint of constipation.  The patient was given MiraLAX and his symptoms have resolved.  Also in the interval, the patient is scheduled to start radiation under the care of Dr. Sondra Come.  His last dose of radiation is expected on 03/24/2021 and will be given daily.  He underwent his first cycle of chemotherapy last week and tolerated it well without any concerning adverse side effects except for constipation.  Otherwise the patient denies any recent fever, chills, night sweats, or weight loss.  He denies any shortness of breath, hemoptysis, or chest pain.  He has some cough associated with postnasal drainage.  He denies any nausea, vomiting, diarrhea, or constipation anymore.  Patient denies any headache or visual changes.  The patient is here today for evaluation before starting week 2 of treatment.  MEDICAL HISTORY:No past medical history on file.  ALLERGIES:  has No Known  Allergies.  MEDICATIONS:  Current Outpatient Medications  Medication Sig Dispense Refill  . acetaminophen (TYLENOL) 325 MG tablet Take 650 mg by mouth every 6 (six) hours as needed for moderate pain or headache.    . Ascorbic Acid (VITAMIN C) 1000 MG tablet Take 1,000 mg by mouth once a week.    . cholecalciferol (VITAMIN D3) 25 MCG (1000 UNIT) tablet Take 1,000 Units by mouth once a week. (Patient not taking: Reported on 02/02/2021)    . Dextromethorphan-guaiFENesin 5-100 MG/5ML LIQD Take by mouth.    Marland Kitchen guaiFENesin (MUCINEX) 600 MG 12 hr tablet Take 600 mg by mouth 1 day or 1 dose.    . Magnesium 400 MG CAPS Take 400 mg by mouth once a week. (Patient not taking: Reported on 02/02/2021)    . Multiple Vitamin (MULTIVITAMIN WITH MINERALS) TABS tablet Take 1 tablet by mouth once a week. (Patient not taking: Reported on 02/02/2021)    . Multiple Vitamins-Minerals (AIRBORNE PO) Take 1 tablet by mouth once a week. (Patient not taking: Reported on 02/02/2021)    . naproxen sodium (ALEVE) 220 MG tablet Take 220 mg by mouth daily as needed (pain). (Patient not taking: Reported on 02/02/2021)    . Omega-3 Fatty Acids (FISH OIL ULTRA) 1400 MG CAPS Take 1,400 mg by mouth once a week. (Patient not taking: Reported on 02/02/2021)    . polyethylene glycol (MIRALAX / GLYCOLAX) 17 g packet Take 17 g by mouth daily. 14 each 0  . prochlorperazine (COMPAZINE) 10 MG tablet Take 1 tablet (10 mg total) by mouth every 6 (six) hours as needed for nausea or vomiting. (Patient  not taking: Reported on 02/02/2021) 30 tablet 0  . vitamin E 180 MG (400 UNITS) capsule Take 400 Units by mouth once a week. (Patient not taking: Reported on 02/02/2021)     No current facility-administered medications for this visit.    SURGICAL HISTORY:  Past Surgical History:  Procedure Laterality Date  . APPENDECTOMY    . BRONCHIAL BIOPSY  01/07/2021   Procedure: BRONCHIAL BIOPSIES;  Surgeon: Garner Nash, DO;  Location: [facility];  Service:  Pulmonary;;  . BRONCHIAL BRUSHINGS  01/07/2021   Procedure: BRONCHIAL BRUSHINGS;  Surgeon: Garner Nash, DO;  Location: [facility];  Service: Pulmonary;;  . BRONCHIAL NEEDLE ASPIRATION BIOPSY  01/07/2021   Procedure: BRONCHIAL NEEDLE ASPIRATION BIOPSIES;  Surgeon: Garner Nash, DO;  Location: [facility];  Service: Pulmonary;;  . BRONCHIAL WASHINGS  01/07/2021   Procedure: BRONCHIAL WASHINGS;  Surgeon: Garner Nash, DO;  Location: [facility];  Service: Pulmonary;;  . IRRIGATION AND DEBRIDEMENT SEBACEOUS CYST    . VIDEO BRONCHOSCOPY WITH ENDOBRONCHIAL ULTRASOUND Bilateral 01/07/2021   Procedure: VIDEO BRONCHOSCOPY WITH ENDOBRONCHIAL ULTRASOUND;  Surgeon: Garner Nash, DO;  Location: [facility];  Service: Pulmonary;  Laterality: Bilateral;  possible need for radial ultrasound and fluoro     REVIEW OF SYSTEMS:   Review of Systems  Constitutional: Negative for appetite change, chills, fatigue, fever and unexpected weight change.  HENT:   Negative for mouth sores, nosebleeds, sore throat and trouble swallowing.   Eyes: Negative for eye problems and icterus.  Respiratory: Negative for cough, hemoptysis, shortness of breath and wheezing.   Cardiovascular: Negative for chest pain and leg swelling.  Gastrointestinal: Negative for abdominal pain, constipation, diarrhea, nausea and vomiting.  Genitourinary: Negative for bladder incontinence, difficulty urinating, dysuria, frequency and hematuria.   Musculoskeletal: Negative for back pain, gait problem, neck pain and neck stiffness.  Skin: Negative for itching and rash.  Neurological: Negative for dizziness, extremity weakness, gait problem, headaches, light-headedness and seizures.  Hematological: Negative for adenopathy. Does not bruise/bleed easily.  Psychiatric/Behavioral: Negative for confusion, depression and sleep disturbance. The patient is not nervous/anxious.     PHYSICAL EXAMINATION:  There were no vitals taken for this  visit.  ECOG PERFORMANCE STATUS: {CHL ONC ECOG [id number]  Physical Exam  Constitutional: Oriented to person, place, and time and well-developed, well-nourished, and in no distress. No distress.  HENT:  Head: Normocephalic and atraumatic.  Mouth/Throat: Oropharynx is clear and moist. No oropharyngeal exudate.  Eyes: Conjunctivae are normal. Right eye exhibits no discharge. Left eye exhibits no discharge. No scleral icterus.  Neck: Normal range of motion. Neck supple.  Cardiovascular: Normal rate, regular rhythm, normal heart sounds and intact distal pulses.   Pulmonary/Chest: Effort normal and breath sounds normal. No respiratory distress. No wheezes. No rales.  Abdominal: Soft. Bowel sounds are normal. Exhibits no distension and no mass. There is no tenderness.  Musculoskeletal: Normal range of motion. Exhibits no edema.  Lymphadenopathy:    No cervical adenopathy.  Neurological: Alert and oriented to person, place, and time. Exhibits normal muscle tone. Gait normal. Coordination normal.  Skin: Skin is warm and dry. No rash noted. Not diaphoretic. No erythema. No pallor.  Psychiatric: Mood, memory and judgment normal.  Vitals reviewed.  LABORATORY DATA: Lab Results  Component Value Date   WBC 6.6 02/02/2021   HGB 10.7 (L) 02/02/2021   HCT 33.9 (L) 02/02/2021   MCV 102.1 (H) 02/02/2021   PLT 333 02/02/2021      Chemistry  Component Value Date/Time   NA 139 02/02/2021 0824   K 4.0 02/02/2021 0824   CL 104 02/02/2021 0824   CO2 26 02/02/2021 0824   BUN 18 02/02/2021 0824   CREATININE 0.96 02/02/2021 0824      Component Value Date/Time   CALCIUM 9.3 02/02/2021 0824   ALKPHOS 53 02/02/2021 0824   AST 12 (L) 02/02/2021 0824   ALT 6 02/02/2021 0824   BILITOT 0.2 (L) 02/02/2021 0824       RADIOGRAPHIC STUDIES:  MR BRAIN W WO CONTRAST  Result Date: 01/24/2021 CLINICAL DATA:  Lung cancer, staging EXAM: MRI HEAD WITHOUT AND WITH CONTRAST TECHNIQUE: Multiplanar,  multiecho pulse sequences of the brain and surrounding structures were obtained without and with intravenous contrast. CONTRAST:  68mL GADAVIST GADOBUTROL 1 MMOL/ML IV SOLN COMPARISON:  None. FINDINGS: Brain: There is no acute infarction or intracranial hemorrhage. A small cortical/subcortical focus of diffusion hyperintensity with ADC isointensity in the left occipital lobe is present without associated enhancement. Suspect very small left falcine meningioma measuring 4 mm (series 20, image 136; series 11, image 46). There is no mass effect or edema. There is no hydrocephalus or extra-axial fluid collection. Ventricles and sulci are within normal limits in size and configuration. Patchy foci of T2 hyperintensity in the supratentorial and pontine white matter are nonspecific but may reflect mild to moderate chronic microvascular ischemic changes. 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 Few small chronic cerebral white matter infarcts. No abnormal enhancement. Vascular: Major vessel flow voids at the skull base are preserved. Skull and upper cervical spine: Normal marrow signal is preserved. Sinuses/Orbits: Minor mucosal thickening.  Orbits are unremarkable. Other: Sella is unremarkable.  Mastoid air cells are clear. IMPRESSION: No evidence of intracranial metastatic disease. Small left occipital subacute infarct. Probable small meningioma along the falx. Electronically Signed   By: Macy Mis M.D.   On: 01/24/2021 10:09   NM PET Image Initial (PI) Skull Base To Thigh  Result Date: 01/24/2021 CLINICAL DATA:  Initial treatment strategy for lung cancer. EXAM: NUCLEAR MEDICINE PET SKULL BASE TO THIGH TECHNIQUE: 8.9 mCi F-18 FDG was injected intravenously. Full-ring PET imaging was performed from the skull base to thigh after the radiotracer. CT data was obtained and used for attenuation correction and anatomic localization. Fasting blood glucose: 97 mg/dl COMPARISON:  CT chest 12/28/2020.  FINDINGS: Mediastinal blood pool activity: SUV max 1.9 Liver activity: SUV max NA NECK: No abnormal hypermetabolism. Incidental CT findings: None. CHEST: Mildly asymmetric hypermetabolism in the left lobe of the thyroid, SUV max 3.6. Hypermetabolic mediastinal lymph nodes are seen bilaterally. Index right paratracheal lymph node measures 12 mm (4/60) with an SUV max of 15.1. Hypermetabolic left hilar adenopathy with a central left upper lobe hypermetabolic mass best measured on 12/28/2020, SUV max of 11.3. Incidental CT findings: Atherosclerotic calcification of the aorta, aortic valve and coronary arteries. Aberrant right subclavian artery. Heart is enlarged. No pericardial effusion. Obstruction of the left upper lobe bronchus with post obstructive collapse/consolidation in the left upper lobe, increased from 12/28/2020. ABDOMEN/PELVIS: No abnormal hypermetabolism in the liver, adrenal glands, spleen or pancreas. No hypermetabolic lymph nodes. Incidental CT findings: Liver is unremarkable. Stones are seen in the gallbladder. Slight nodular thickening of both adrenal glands. Stones are seen in the right kidney. Marked atrophy in the left kidney with hydronephrosis the level of the ureteropelvic junction. Spleen and pancreas are unremarkable. Proximal gastric wall thickening. Scattered ascites. Bladder wall thickening. Prostate is enlarged. Cystic structure in  the small bowel mesentery has areas of peripheral calcification, measuring 5.8 x 6.2 cm. There are low-density fluid collection along the subcutaneous ventral midline abdominal wall, which may be due to prior surgery. Atherosclerotic calcification of the aorta. SKELETON: There may be a 13 mm right paramidline soft tissue nodule posterior to the L2 vertebral body (4/123) with an SUV max of 5.0. No hypermetabolic osseous lesions. Incidental CT findings: Degenerative changes in the spine. IMPRESSION: 1. Centrally obstructing hypermetabolic left upper lobe mass  with hypermetabolic adenopathy extending to the contralateral mediastinum. Findings are most consistent with stage IIIB primary bronchogenic carcinoma. 2. Hypermetabolic right paramidline soft tissue nodule posterior to the L2 vertebral body, indeterminate. Finding may be degenerative. Difficult to exclude a metastasis. 3. Peripherally calcified cystic lesion in the small bowel mesentery without associated hypermetabolism, favoring a benign lesion. 4. Asymmetric hypermetabolism in the left lobe of the thyroid. Consider thyroid ultrasound in further evaluation, as clinically indicated. 5. Cholelithiasis. 6. Right renal stones. 7. Left renal atrophy with hydronephrosis, suggesting a chronic UPJ obstruction. 8. Proximal gastric wall thickening. 9. Enlarged prostate. 10. Aortic atherosclerosis (ICD10-I70.0). Coronary artery calcification. Electronically Signed   By: Lorin Picket M.D.   On: 01/24/2021 10:46     ASSESSMENT/PLAN:  This is a very pleasant 87 year old male diagnosed with stage IIIB non-small cell lung cancer, most consistent with squamous cell carcinoma.  He presented with a centrally obstructing left upper lobe lung mass with contralateral mediastinal lymphadenopathy.  There is also an indeterminate midline soft tissue nodule posterior to the L2 vertebrae which is indeterminate.  He was diagnosed in April 2022.  His PD-L1 expression was 0%.  The patient is currently undergoing concurrent chemoradiation with carboplatin for an AUC of 2, and paclitaxel 45 mg per metered square.  He his first dose was given on 02/02/2021.  The patient is undergoing radiation under the care of Dr. Sondra Come and his last day of treatment is scheduled for 03/24/2021.  The patient was seen with Dr. Julien Nordmann today.  Labs reviewed.  Recommend he proceed with cycle #2 today scheduled.  We will see him back for follow-up visit in 2 weeks before starting cycle #4.  I reviewed constipation education with the patient.  The  patient was advised to call immediately if he has any concerning symptoms in the interval. The patient voices understanding of current disease status and treatment options and is in agreement with the current care plan. All questions were answered. The patient knows to call the clinic with any problems, questions or concerns. We can certainly see the patient much sooner if necessary     No orders of the defined types were placed in this encounter.    I spent {CHL ONC TIME VISIT - EZMOQ:[id number]} counseling the patient face to face. The total time spent in the appointment was {CHL ONC TIME VISIT - TWSFK:[id number]}.  Wyatt Thorstenson L Gina Costilla, PA-C 02/05/21

## 2021-02-05 NOTE — ED Provider Notes (Signed)
[facility] DEPT Provider Note   CSN: [id number] Arrival date & time: 02/04/21  1956     History Chief Complaint  Patient presents with  . Constipation    Leon Lyons is a 87 y.o. adult.  The history is provided by the patient and the spouse.  Constipation Severity:  Moderate Time since last bowel movement:  5 days Timing:  Constant Progression:  Worsening Chronicity:  New Context: not narcotics   Relieved by:  Nothing Worsened by:  Nothing Associated symptoms: flatus   Associated symptoms: no abdominal pain, no diarrhea, no fever, no nausea and no vomiting        Patient with history of non-small cell lung cancer presents with constipation.  Patient reports it has been up to 5 days since his last bowel movement.  He is passing flatus.  No fevers or vomiting.  No abdominal pain or distention.  He is not on chronic pain medications. He does take stool softeners.  He is undergoing chemoradiation.  He called his oncologist and the nurse told him to go the ER. He does not feel that he has a stool impaction  Patient Active Problem List   Diagnosis Date Noted  . Non-small cell carcinoma of left lung, stage 3 ([place]) 01/12/2021  . Encounter for antineoplastic chemotherapy 01/12/2021  . Lung mass 12/29/2020    Past Surgical History:  Procedure Laterality Date  . APPENDECTOMY    . BRONCHIAL BIOPSY  01/07/2021   Procedure: BRONCHIAL BIOPSIES;  Surgeon: Garner Nash, DO;  Location: [facility];  Service: Pulmonary;;  . BRONCHIAL BRUSHINGS  01/07/2021   Procedure: BRONCHIAL BRUSHINGS;  Surgeon: Garner Nash, DO;  Location: [facility];  Service: Pulmonary;;  . BRONCHIAL NEEDLE ASPIRATION BIOPSY  01/07/2021   Procedure: BRONCHIAL NEEDLE ASPIRATION BIOPSIES;  Surgeon: Garner Nash, DO;  Location: [facility];  Service: Pulmonary;;  . BRONCHIAL WASHINGS  01/07/2021   Procedure: BRONCHIAL WASHINGS;  Surgeon: Garner Nash, DO;   Location: [facility];  Service: Pulmonary;;  . IRRIGATION AND DEBRIDEMENT SEBACEOUS CYST    . VIDEO BRONCHOSCOPY WITH ENDOBRONCHIAL ULTRASOUND Bilateral 01/07/2021   Procedure: VIDEO BRONCHOSCOPY WITH ENDOBRONCHIAL ULTRASOUND;  Surgeon: Garner Nash, DO;  Location: [facility];  Service: Pulmonary;  Laterality: Bilateral;  possible need for radial ultrasound and fluoro      OB History   No obstetric history on file.     No family history on file.  Social History   Tobacco Use  . Smoking status: Former Smoker    Years: 5.00    Types: Cigarettes    Quit date: 1962    Years since quitting: 60.3  . Smokeless tobacco: Never Used  . Tobacco comment: light/occasional smoker back in early 1960's  Vaping Use  . Vaping Use: Never used    Home Medications Prior to Admission medications   Medication Sig Start Date End Date Taking? Authorizing Provider  polyethylene glycol (MIRALAX / GLYCOLAX) 17 g packet Take 17 g by mouth daily. 02/04/21  Yes Ripley Fraise, MD  acetaminophen (TYLENOL) 325 MG tablet Take 650 mg by mouth every 6 (six) hours as needed for moderate pain or headache.    [provider]  Ascorbic Acid (VITAMIN C) 1000 MG tablet Take 1,000 mg by mouth once a week.    [provider]  cholecalciferol (VITAMIN D3) 25 MCG (1000 UNIT) tablet Take 1,000 Units by mouth once a week. Patient not taking: Reported on 02/02/2021  [provider]  Dextromethorphan-guaiFENesin 5-100 MG/5ML LIQD Take by mouth.    [provider]  guaiFENesin (MUCINEX) 600 MG 12 hr tablet Take 600 mg by mouth 1 day or 1 dose.    [provider]  Magnesium 400 MG CAPS Take 400 mg by mouth once a week. Patient not taking: Reported on 02/02/2021    [provider]  Multiple Vitamin (MULTIVITAMIN WITH MINERALS) TABS tablet Take 1 tablet by mouth once a week. Patient not taking: Reported on 02/02/2021    [provider]  Multiple  Vitamins-Minerals (AIRBORNE PO) Take 1 tablet by mouth once a week. Patient not taking: Reported on 02/02/2021    [provider]  naproxen sodium (ALEVE) 220 MG tablet Take 220 mg by mouth daily as needed (pain). Patient not taking: Reported on 02/02/2021    [provider]  Omega-3 Fatty Acids (FISH OIL ULTRA) 1400 MG CAPS Take 1,400 mg by mouth once a week. Patient not taking: Reported on 02/02/2021    [provider]  prochlorperazine (COMPAZINE) 10 MG tablet Take 1 tablet (10 mg total) by mouth every 6 (six) hours as needed for nausea or vomiting. Patient not taking: Reported on 02/02/2021 01/12/21   [name], MD  vitamin E 180 MG (400 UNITS) capsule Take 400 Units by mouth once a week. Patient not taking: Reported on 02/02/2021    [provider]    Allergies    Patient has no known allergies.  Review of Systems   Review of Systems  Constitutional: Negative for fever.  Gastrointestinal: Positive for constipation and flatus. Negative for abdominal pain, diarrhea, nausea and vomiting.  All other systems reviewed and are negative.   Physical Exam Updated Vital Signs BP 135/76   Pulse (!) 54   Temp 98.9 F (37.2 C)   Resp 17   Ht 1.727 m (5\' 8" )   Wt 81.6 kg   SpO2 93%   BMI 27.37 kg/m   Physical Exam CONSTITUTIONAL: Elderly but appears much younger than stated age HEAD: Normocephalic/atraumatic EYES: EOMI/PERRL ENMT: Mucous membranes moist NECK: supple no meningeal signs SPINE/BACK:entire spine nontender CV: S1/S2 noted, no murmurs/rubs/gallops noted LUNGS: Lungs are clear to auscultation bilaterally, no apparent distress ABDOMEN: soft, nontender, no rebound or guarding, bowel sounds noted throughout abdomen, nondistended GU:no cva tenderness NEURO: Pt is awake/alert/appropriate, moves all extremitiesx4.  No facial droop.   EXTREMITIES: pulses normal/equal, full ROM SKIN: warm, color normal PSYCH: no abnormalities of mood noted,  alert and oriented to situation  ED Results / Procedures / Treatments   Labs (all labs ordered are listed, but only abnormal results are displayed) Labs Reviewed - No data to display  EKG None  Radiology No results found.  Procedures Procedures   Medications Ordered in ED Medications  polyethylene glycol (MIRALAX / GLYCOLAX) packet 17 g (17 g Oral Provided for home use 02/04/21 2344)    ED Course  I have reviewed the triage vital signs and the nursing notes.     MDM Rules/Calculators/A&P                          Patient presents with constipation.  He is in no acute distress.  Patient appears very well for his age and having cancer.  He has no abdominal distention or focal tenderness.  He is not vomiting.  Patient is requesting additional medications, will prescribe MiraLAX.  No indication for imaging or other acute evaluation at this  time Final Clinical Impression(s) / ED Diagnoses Final diagnoses:  Slow transit constipation    Rx / DC Orders ED Discharge Orders         Ordered    polyethylene glycol (MIRALAX / GLYCOLAX) 17 g packet  Daily        02/04/21 2336           Ripley Fraise, MD 02/05/21 [phone]

## 2021-02-08 ENCOUNTER — Other Ambulatory Visit: Payer: Self-pay

## 2021-02-08 ENCOUNTER — Inpatient Hospital Stay (HOSPITAL_BASED_OUTPATIENT_CLINIC_OR_DEPARTMENT_OTHER): Payer: Medicare Other | Admitting: Internal Medicine

## 2021-02-08 ENCOUNTER — Inpatient Hospital Stay: Payer: Medicare Other

## 2021-02-08 VITALS — BP 128/61 | HR 67 | Temp 97.1°F | Resp 18 | Ht 68.0 in | Wt 179.5 lb

## 2021-02-08 DIAGNOSIS — C3492 Malignant neoplasm of unspecified part of left bronchus or lung: Secondary | ICD-10-CM

## 2021-02-08 DIAGNOSIS — M79605 Pain in left leg: Secondary | ICD-10-CM

## 2021-02-08 DIAGNOSIS — Z923 Personal history of irradiation: Secondary | ICD-10-CM | POA: Insufficient documentation

## 2021-02-08 DIAGNOSIS — Z5111 Encounter for antineoplastic chemotherapy: Secondary | ICD-10-CM | POA: Insufficient documentation

## 2021-02-08 DIAGNOSIS — Z79899 Other long term (current) drug therapy: Secondary | ICD-10-CM | POA: Insufficient documentation

## 2021-02-08 DIAGNOSIS — C3412 Malignant neoplasm of upper lobe, left bronchus or lung: Secondary | ICD-10-CM | POA: Insufficient documentation

## 2021-02-08 LAB — CBC WITH DIFFERENTIAL (CANCER CENTER ONLY)
Abs Immature Granulocytes: 0.05 10*3/uL (ref 0.00–0.07)
Basophils Absolute: 0 10*3/uL (ref 0.0–0.1)
Basophils Relative: 1 %
Eosinophils Absolute: 0 10*3/uL (ref 0.0–0.5)
Eosinophils Relative: 1 %
HCT: 32.1 % — ABNORMAL LOW (ref 39.0–52.0)
Hemoglobin: 10.3 g/dL — ABNORMAL LOW (ref 13.0–17.0)
Immature Granulocytes: 1 %
Lymphocytes Relative: 36 %
Lymphs Abs: 2 10*3/uL (ref 0.7–4.0)
MCH: 32.4 pg (ref 26.0–34.0)
MCHC: 32.1 g/dL (ref 30.0–36.0)
MCV: 100.9 fL — ABNORMAL HIGH (ref 80.0–100.0)
Monocytes Absolute: 0.3 10*3/uL (ref 0.1–1.0)
Monocytes Relative: 6 %
Neutro Abs: 3 10*3/uL (ref 1.7–7.7)
Neutrophils Relative %: 55 %
Platelet Count: 335 10*3/uL (ref 150–400)
RBC: 3.18 MIL/uL — ABNORMAL LOW (ref 4.22–5.81)
RDW: 13.2 % (ref 11.5–15.5)
WBC Count: 5.4 10*3/uL (ref 4.0–10.5)
nRBC: 0 % (ref 0.0–0.2)

## 2021-02-08 LAB — CMP (CANCER CENTER ONLY)
ALT: 7 U/L (ref 0–44)
AST: 11 U/L — ABNORMAL LOW (ref 15–41)
Albumin: 2.7 g/dL — ABNORMAL LOW (ref 3.5–5.0)
Alkaline Phosphatase: 54 U/L (ref 38–126)
Anion gap: 5 (ref 5–15)
BUN: 15 mg/dL (ref 8–23)
CO2: 28 mmol/L (ref 22–32)
Calcium: 8.9 mg/dL (ref 8.9–10.3)
Chloride: 103 mmol/L (ref 98–111)
Creatinine: 0.89 mg/dL (ref 0.61–1.24)
GFR, Estimated: >60 mL/min (ref >60)
Glucose, Bld: 103 mg/dL — ABNORMAL HIGH (ref 70–99)
Potassium: 4.4 mmol/L (ref 3.5–5.1)
Sodium: 136 mmol/L (ref 135–145)
Total Bilirubin: <0.2 mg/dL — ABNORMAL LOW (ref 0.3–1.2)
Total Protein: 8.2 g/dL — ABNORMAL HIGH (ref 6.5–8.1)

## 2021-02-08 MED ORDER — SODIUM CHLORIDE 0.9 % IV SOLN
174.0000 mg | Freq: Once | INTRAVENOUS | Status: AC
  Administered 2021-02-08: 170 mg via INTRAVENOUS
  Filled 2021-02-08: qty 17

## 2021-02-08 MED ORDER — SODIUM CHLORIDE 0.9 % IV SOLN
20.0000 mg | Freq: Once | INTRAVENOUS | Status: AC
  Administered 2021-02-08: 20 mg via INTRAVENOUS
  Filled 2021-02-08: qty 20

## 2021-02-08 MED ORDER — DIPHENHYDRAMINE HCL 50 MG/ML IJ SOLN
INTRAMUSCULAR | Status: AC
  Filled 2021-02-08: qty 1

## 2021-02-08 MED ORDER — FAMOTIDINE 20 MG IN NS 100 ML IVPB
INTRAVENOUS | Status: AC
  Filled 2021-02-08: qty 100

## 2021-02-08 MED ORDER — PALONOSETRON HCL INJECTION 0.25 MG/5ML
0.2500 mg | Freq: Once | INTRAVENOUS | Status: AC
  Administered 2021-02-08: 0.25 mg via INTRAVENOUS

## 2021-02-08 MED ORDER — FAMOTIDINE 20 MG IN NS 100 ML IVPB
20.0000 mg | Freq: Once | INTRAVENOUS | Status: AC
  Administered 2021-02-08: 20 mg via INTRAVENOUS

## 2021-02-08 MED ORDER — SODIUM CHLORIDE 0.9 % IV SOLN
Freq: Once | INTRAVENOUS | Status: AC
Start: 2021-02-08 — End: 2021-02-08
  Filled 2021-02-08: qty 250

## 2021-02-08 MED ORDER — SODIUM CHLORIDE 0.9 % IV SOLN
45.0000 mg/m2 | Freq: Once | INTRAVENOUS | Status: AC
  Administered 2021-02-08: 90 mg via INTRAVENOUS
  Filled 2021-02-08: qty 15

## 2021-02-08 MED ORDER — PALONOSETRON HCL INJECTION 0.25 MG/5ML
INTRAVENOUS | Status: AC
  Filled 2021-02-08: qty 5

## 2021-02-08 MED ORDER — DIPHENHYDRAMINE HCL 50 MG/ML IJ SOLN
50.0000 mg | Freq: Once | INTRAMUSCULAR | Status: AC
  Administered 2021-02-08: 50 mg via INTRAVENOUS

## 2021-02-08 NOTE — Patient Instructions (Signed)
[facility]  Discharge Instructions: Thank you for choosing [facility] to provide your oncology and hematology care.   If you have a lab appointment with the Cancer Center, please go directly to the Cancer Center and check in at the registration area.   Wear comfortable clothing and clothing appropriate for easy access to any Portacath or PICC line.   We strive to give you quality time with your provider. You may need to reschedule your appointment if you arrive late (15 or more minutes).  Arriving late affects you and other patients whose appointments are after yours.  Also, if you miss three or more appointments without notifying the office, you may be dismissed from the clinic at the provider's discretion.      For prescription refill requests, have your pharmacy contact our office and allow 72 hours for refills to be completed.    Today you received the following chemotherapy and/or immunotherapy agents: Taxol & Carboplatin   To help prevent nausea and vomiting after your treatment, we encourage you to take your nausea medication as directed.  BELOW ARE SYMPTOMS THAT SHOULD BE REPORTED IMMEDIATELY: *FEVER GREATER THAN 100.4 F (38 C) OR HIGHER *CHILLS OR SWEATING *NAUSEA AND VOMITING THAT IS NOT CONTROLLED WITH YOUR NAUSEA MEDICATION *UNUSUAL SHORTNESS OF BREATH *UNUSUAL BRUISING OR BLEEDING *URINARY PROBLEMS (pain or burning when urinating, or frequent urination) *BOWEL PROBLEMS (unusual diarrhea, constipation, pain near the anus) TENDERNESS IN MOUTH AND THROAT WITH OR WITHOUT PRESENCE OF ULCERS (sore throat, sores in mouth, or a toothache) UNUSUAL RASH, SWELLING OR PAIN  UNUSUAL VAGINAL DISCHARGE OR ITCHING   Items with * indicate a potential emergency and should be followed up as soon as possible or go to the Emergency Department if any problems should occur.  Please show the CHEMOTHERAPY ALERT CARD or IMMUNOTHERAPY ALERT CARD at  check-in to the Emergency Department and triage nurse.  Should you have questions after your visit or need to cancel or reschedule your appointment, please contact [facility]  Dept: [phone]  and follow the prompts.  Office hours are 8:00 a.m. to 4:30 p.m. Monday - Friday. Please note that voicemails left after 4:00 p.m. may not be returned until the following business day.  We are closed weekends and major holidays. You have access to a nurse at all times for urgent questions. Please call the main number to the clinic Dept: [phone] and follow the prompts.   For any non-urgent questions, you may also contact your provider using MyChart. We now offer e-Visits for anyone 18 and older to request care online for non-urgent symptoms. For details visit [email or web address].   Also download the MyChart app! Go to the app store, search "MyChart", open the app, select [facility], and log in with your MyChart username and password.  Due to Covid, a mask is required upon entering the hospital/clinic. If you do not have a mask, one will be given to you upon arrival. For doctor visits, patients may have 1 support person aged 18 or older with them. For treatment visits, patients cannot have anyone with them due to current Covid guidelines and our immunocompromised population.   

## 2021-02-08 NOTE — Progress Notes (Signed)
[place] [phone]   [phone]  OFFICE PROGRESS NOTE  [name], MD [place] [address]  DIAGNOSIS: Stage IIIB non-small cell lung cancer, morphology and immunophenotype is consistent with poorly differentiated squamous cell carcinoma.  He presented with a centrally obstructing left upper lobe mass with contralateral mediastinal lymphadenopathy.  There is also an indeterminate right paramidline soft tissue nodule in the L2 vertebrae which is indeterminate.  He was diagnosed in April 2022.  PDL1 Expression: 0%  PRIOR THERAPY: None  CURRENT THERAPY: Weekly concurrent chemoradiation with carboplatin for AUC of 2 and paclitaxel 45 mg/m2.  First dose expected on 01/31/21.   Status post 1 cycle.  INTERVAL HISTORY: Leon Lyons 87 y.o. adult returns to the clinic today for follow-up visit accompanied by Leon Lyons.  The patient is feeling fine today with no complaints except for pain on the calf of the left leg.  It started few days ago.  He tolerated the first week of Leon treatment fairly well.  He denied having any chest pain, shortness of breath except with exertion with mild cough and no hemoptysis.  He denied having any fever or chills.  He has no nausea, vomiting, diarrhea or constipation.  He denied having any headache or visual changes.  The patient is here today for evaluation before starting cycle #2 of Leon treatment.  He is expected to start radiotherapy later this week.  MEDICAL HISTORY:No past medical history on file.  ALLERGIES:  has No Known Allergies.  MEDICATIONS:  Current Outpatient Medications  Medication Sig Dispense Refill  . acetaminophen (TYLENOL) 325 MG tablet Take 650 mg by mouth every 6 (six) hours as needed for moderate pain or headache.    . Ascorbic Acid (VITAMIN C) 1000 MG tablet Take 1,000 mg by mouth once a week.    . cholecalciferol (VITAMIN D3) 25 MCG (1000 UNIT) tablet Take  1,000 Units by mouth once a week. (Patient not taking: Reported on 02/02/2021)    . Dextromethorphan-guaiFENesin 5-100 MG/5ML LIQD Take by mouth.    Marland Kitchen guaiFENesin (MUCINEX) 600 MG 12 hr tablet Take 600 mg by mouth 1 day or 1 dose.    . Magnesium 400 MG CAPS Take 400 mg by mouth once a week. (Patient not taking: Reported on 02/02/2021)    . Multiple Vitamin (MULTIVITAMIN WITH MINERALS) TABS tablet Take 1 tablet by mouth once a week. (Patient not taking: Reported on 02/02/2021)    . Multiple Vitamins-Minerals (AIRBORNE PO) Take 1 tablet by mouth once a week. (Patient not taking: Reported on 02/02/2021)    . naproxen sodium (ALEVE) 220 MG tablet Take 220 mg by mouth daily as needed (pain). (Patient not taking: Reported on 02/02/2021)    . Omega-3 Fatty Acids (FISH OIL ULTRA) 1400 MG CAPS Take 1,400 mg by mouth once a week. (Patient not taking: Reported on 02/02/2021)    . polyethylene glycol (MIRALAX / GLYCOLAX) 17 g packet Take 17 g by mouth daily. 14 each 0  . prochlorperazine (COMPAZINE) 10 MG tablet Take 1 tablet (10 mg total) by mouth every 6 (six) hours as needed for nausea or vomiting. (Patient not taking: Reported on 02/02/2021) 30 tablet 0  . vitamin E 180 MG (400 UNITS) capsule Take 400 Units by mouth once a week. (Patient not taking: Reported on 02/02/2021)     No current facility-administered medications for this visit.    SURGICAL HISTORY:  Past Surgical History:  Procedure Laterality Date  . APPENDECTOMY    . BRONCHIAL BIOPSY  01/07/2021   Procedure: BRONCHIAL BIOPSIES;  Surgeon: Garner Nash, DO;  Location: [facility];  Service: Pulmonary;;  . BRONCHIAL BRUSHINGS  01/07/2021   Procedure: BRONCHIAL BRUSHINGS;  Surgeon: Garner Nash, DO;  Location: [facility];  Service: Pulmonary;;  . BRONCHIAL NEEDLE ASPIRATION BIOPSY  01/07/2021   Procedure: BRONCHIAL NEEDLE ASPIRATION BIOPSIES;  Surgeon: Garner Nash, DO;  Location: [facility];  Service: Pulmonary;;  . BRONCHIAL WASHINGS   01/07/2021   Procedure: BRONCHIAL WASHINGS;  Surgeon: Garner Nash, DO;  Location: [facility];  Service: Pulmonary;;  . IRRIGATION AND DEBRIDEMENT SEBACEOUS CYST    . VIDEO BRONCHOSCOPY WITH ENDOBRONCHIAL ULTRASOUND Bilateral 01/07/2021   Procedure: VIDEO BRONCHOSCOPY WITH ENDOBRONCHIAL ULTRASOUND;  Surgeon: Garner Nash, DO;  Location: [facility];  Service: Pulmonary;  Laterality: Bilateral;  possible need for radial ultrasound and fluoro     REVIEW OF SYSTEMS:  A comprehensive review of systems was negative except for: Constitutional: positive for fatigue Respiratory: positive for dyspnea on exertion Musculoskeletal: positive for myalgias   PHYSICAL EXAMINATION: General appearance: alert, cooperative and no distress Head: Normocephalic, without obvious abnormality, atraumatic Neck: no adenopathy, no JVD, supple, symmetrical, trachea midline and thyroid not enlarged, symmetric, no tenderness/mass/nodules Lymph nodes: Cervical, supraclavicular, and axillary nodes normal. Resp: clear to auscultation bilaterally Back: symmetric, no curvature. ROM normal. No CVA tenderness. Cardio: regular rate and rhythm, S1, S2 normal, no murmur, click, rub or gallop GI: soft, non-tender; bowel sounds normal; no masses,  no organomegaly Extremities: extremities normal, atraumatic, no cyanosis or edema  ECOG PERFORMANCE STATUS: 1 - Symptomatic but completely ambulatory  Blood pressure 128/61, pulse 67, temperature (!) 97.1 F (36.2 C), temperature source Tympanic, resp. rate 18, height _0  (1.727 m), weight 179 lb 8 oz (81.4 kg), SpO2 96 %.  LABORATORY DATA: Lab Results  Component Value Date   WBC 5.4 02/08/2021   HGB 10.3 (L) 02/08/2021   HCT 32.1 (L) 02/08/2021   MCV 100.9 (H) 02/08/2021   PLT 335 02/08/2021      Chemistry      Component Value Date/Time   NA 136 02/08/2021 1001   K 4.4 02/08/2021 1001   CL 103 02/08/2021 1001   CO2 28 02/08/2021 1001   BUN 15 02/08/2021 1001    CREATININE 0.89 02/08/2021 1001      Component Value Date/Time   CALCIUM 8.9 02/08/2021 1001   ALKPHOS 54 02/08/2021 1001   AST 11 (L) 02/08/2021 1001   ALT 7 02/08/2021 1001   BILITOT <0.2 (L) 02/08/2021 1001       RADIOGRAPHIC STUDIES: MR BRAIN W WO CONTRAST  Result Date: 01/24/2021 CLINICAL DATA:  Lung cancer, staging EXAM: MRI HEAD WITHOUT AND WITH CONTRAST TECHNIQUE: Multiplanar, multiecho pulse sequences of the brain and surrounding structures were obtained without and with intravenous contrast. CONTRAST:  64m GADAVIST GADOBUTROL 1 MMOL/ML IV SOLN COMPARISON:  None. FINDINGS: Brain: There is no acute infarction or intracranial hemorrhage. A small cortical/subcortical focus of diffusion hyperintensity with ADC isointensity in the left occipital lobe is present without associated enhancement. Suspect very small left falcine meningioma measuring 4 mm (series 20, image 136; series 11, image 46). There is no mass effect or edema. There is no hydrocephalus or extra-axial fluid collection. Ventricles and sulci are within normal limits in size and configuration. Patchy foci of T2 hyperintensity in the supratentorial and pontine white matter are nonspecific but may reflect mild  to moderate chronic microvascular ischemic changes. 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 Few small chronic cerebral white matter infarcts. No abnormal enhancement. Vascular: Major vessel flow voids at the skull base are preserved. Skull and upper cervical spine: Normal marrow signal is preserved. Sinuses/Orbits: Minor mucosal thickening.  Orbits are unremarkable. Other: Sella is unremarkable.  Mastoid air cells are clear. IMPRESSION: No evidence of intracranial metastatic disease. Small left occipital subacute infarct. Probable small meningioma along the falx. Electronically Signed   By: Macy Mis M.D.   On: 01/24/2021 10:09   NM PET Image Initial (PI) Skull Base To Thigh  Result Date:  01/24/2021 CLINICAL DATA:  Initial treatment strategy for lung cancer. EXAM: NUCLEAR MEDICINE PET SKULL BASE TO THIGH TECHNIQUE: 8.9 mCi F-18 FDG was injected intravenously. Full-ring PET imaging was performed from the skull base to thigh after the radiotracer. CT data was obtained and used for attenuation correction and anatomic localization. Fasting blood glucose: 97 mg/dl COMPARISON:  CT chest 12/28/2020. FINDINGS: Mediastinal blood pool activity: SUV max 1.9 Liver activity: SUV max NA NECK: No abnormal hypermetabolism. Incidental CT findings: None. CHEST: Mildly asymmetric hypermetabolism in the left lobe of the thyroid, SUV max 3.6. Hypermetabolic mediastinal lymph nodes are seen bilaterally. Index right paratracheal lymph node measures 12 mm (4/60) with an SUV max of 15.1. Hypermetabolic left hilar adenopathy with a central left upper lobe hypermetabolic mass best measured on 12/28/2020, SUV max of 11.3. Incidental CT findings: Atherosclerotic calcification of the aorta, aortic valve and coronary arteries. Aberrant right subclavian artery. Heart is enlarged. No pericardial effusion. Obstruction of the left upper lobe bronchus with post obstructive collapse/consolidation in the left upper lobe, increased from 12/28/2020. ABDOMEN/PELVIS: No abnormal hypermetabolism in the liver, adrenal glands, spleen or pancreas. No hypermetabolic lymph nodes. Incidental CT findings: Liver is unremarkable. Stones are seen in the gallbladder. Slight nodular thickening of both adrenal glands. Stones are seen in the right kidney. Marked atrophy in the left kidney with hydronephrosis the level of the ureteropelvic junction. Spleen and pancreas are unremarkable. Proximal gastric wall thickening. Scattered ascites. Bladder wall thickening. Prostate is enlarged. Cystic structure in the small bowel mesentery has areas of peripheral calcification, measuring 5.8 x 6.2 cm. There are low-density fluid collection along the subcutaneous  ventral midline abdominal wall, which may be due to prior surgery. Atherosclerotic calcification of the aorta. SKELETON: There may be a 13 mm right paramidline soft tissue nodule posterior to the L2 vertebral body (4/123) with an SUV max of 5.0. No hypermetabolic osseous lesions. Incidental CT findings: Degenerative changes in the spine. IMPRESSION: 1. Centrally obstructing hypermetabolic left upper lobe mass with hypermetabolic adenopathy extending to the contralateral mediastinum. Findings are most consistent with stage IIIB primary bronchogenic carcinoma. 2. Hypermetabolic right paramidline soft tissue nodule posterior to the L2 vertebral body, indeterminate. Finding may be degenerative. Difficult to exclude a metastasis. 3. Peripherally calcified cystic lesion in the small bowel mesentery without associated hypermetabolism, favoring a benign lesion. 4. Asymmetric hypermetabolism in the left lobe of the thyroid. Consider thyroid ultrasound in further evaluation, as clinically indicated. 5. Cholelithiasis. 6. Right renal stones. 7. Left renal atrophy with hydronephrosis, suggesting a chronic UPJ obstruction. 8. Proximal gastric wall thickening. 9. Enlarged prostate. 10. Aortic atherosclerosis (ICD10-I70.0). Coronary artery calcification. Electronically Signed   By: Lorin Picket M.D.   On: 01/24/2021 10:46    ASSESSMENT AND PLAN: This is a very pleasant 87 years old white male recently diagnosed with stage IIIb non-small cell lung  cancer, poorly differentiated squamous cell carcinoma diagnosed in April 2022 with negative PD-L1 expression. The patient is currently undergoing a course of concurrent chemoradiation with weekly carboplatin for AUC of 2 and paclitaxel 45 Mg/M2 status post 1 cycle.  He has been tolerating this treatment well with no concerning complaints after the first dose of Leon treatment. I recommended for him to proceed with cycle #2 today as planned. Is expected to start radiotherapy later  this week. For the pain and the calf of the left lower extremity, I will refer the patient to have ultrasound Doppler of the left lower extremity to rule out deep venous thrombosis. The patient will come back for follow-up visit in 2 weeks for evaluation before starting cycle #4. He was advised to call immediately if he has any concerning symptoms in the interval. The patient voices understanding of current disease status and treatment options and is in agreement with the current care plan.  All questions were answered. The patient knows to call the clinic with any problems, questions or concerns. We can certainly see the patient much sooner if necessary.  The total time spent in the appointment was 30 minutes.  Disclaimer: This note was dictated with voice recognition software. Similar sounding words can inadvertently be transcribed and may not be corrected upon review.

## 2021-02-09 ENCOUNTER — Inpatient Hospital Stay: Payer: Medicare Other | Admitting: Physician Assistant

## 2021-02-09 ENCOUNTER — Inpatient Hospital Stay: Payer: Medicare Other

## 2021-02-10 ENCOUNTER — Ambulatory Visit
Admission: RE | Admit: 2021-02-10 | Discharge: 2021-02-10 | Disposition: A | Discharge status: Home / Self Care | Payer: Medicare Other | Source: Ambulatory Visit | Attending: Radiation Oncology | Admitting: Radiation Oncology

## 2021-02-10 ENCOUNTER — Other Ambulatory Visit: Payer: Self-pay

## 2021-02-10 DIAGNOSIS — C3492 Malignant neoplasm of unspecified part of left bronchus or lung: Secondary | ICD-10-CM | POA: Diagnosis not present

## 2021-02-11 ENCOUNTER — Ambulatory Visit
Admission: RE | Admit: 2021-02-11 | Discharge: 2021-02-11 | Disposition: A | Discharge status: Home / Self Care | Payer: Medicare Other | Source: Ambulatory Visit | Attending: Radiation Oncology | Admitting: Radiation Oncology

## 2021-02-11 DIAGNOSIS — C3492 Malignant neoplasm of unspecified part of left bronchus or lung: Secondary | ICD-10-CM | POA: Diagnosis not present

## 2021-02-14 ENCOUNTER — Ambulatory Visit
Admission: RE | Admit: 2021-02-14 | Discharge: 2021-02-14 | Disposition: A | Discharge status: Home / Self Care | Payer: Medicare Other | Source: Ambulatory Visit | Attending: Radiation Oncology | Admitting: Radiation Oncology

## 2021-02-14 ENCOUNTER — Inpatient Hospital Stay: Payer: Medicare Other

## 2021-02-14 ENCOUNTER — Other Ambulatory Visit: Payer: Self-pay

## 2021-02-14 VITALS — BP 105/56 | HR 69 | Temp 98.4°F | Resp 18

## 2021-02-14 DIAGNOSIS — C3492 Malignant neoplasm of unspecified part of left bronchus or lung: Secondary | ICD-10-CM | POA: Diagnosis not present

## 2021-02-14 LAB — CMP (CANCER CENTER ONLY)
ALT: 9 U/L (ref 0–44)
AST: 13 U/L — ABNORMAL LOW (ref 15–41)
Albumin: 2.6 g/dL — ABNORMAL LOW (ref 3.5–5.0)
Alkaline Phosphatase: 59 U/L (ref 38–126)
Anion gap: 8 (ref 5–15)
BUN: 14 mg/dL (ref 8–23)
CO2: 26 mmol/L (ref 22–32)
Calcium: 8.9 mg/dL (ref 8.9–10.3)
Chloride: 102 mmol/L (ref 98–111)
Creatinine: 1.03 mg/dL (ref 0.61–1.24)
GFR, Estimated: >60 mL/min (ref >60)
Glucose, Bld: 82 mg/dL (ref 70–99)
Potassium: 4.6 mmol/L (ref 3.5–5.1)
Sodium: 136 mmol/L (ref 135–145)
Total Bilirubin: <0.2 mg/dL — ABNORMAL LOW (ref 0.3–1.2)
Total Protein: 8 g/dL (ref 6.5–8.1)

## 2021-02-14 LAB — CBC WITH DIFFERENTIAL (CANCER CENTER ONLY)
Abs Immature Granulocytes: 0.04 10*3/uL (ref 0.00–0.07)
Basophils Absolute: 0 10*3/uL (ref 0.0–0.1)
Basophils Relative: 0 %
Eosinophils Absolute: 0 10*3/uL (ref 0.0–0.5)
Eosinophils Relative: 0 %
HCT: 31.5 % — ABNORMAL LOW (ref 39.0–52.0)
Hemoglobin: 9.8 g/dL — ABNORMAL LOW (ref 13.0–17.0)
Immature Granulocytes: 1 %
Lymphocytes Relative: 26 %
Lymphs Abs: 1.3 10*3/uL (ref 0.7–4.0)
MCH: 31.7 pg (ref 26.0–34.0)
MCHC: 31.1 g/dL (ref 30.0–36.0)
MCV: 101.9 fL — ABNORMAL HIGH (ref 80.0–100.0)
Monocytes Absolute: 0.3 10*3/uL (ref 0.1–1.0)
Monocytes Relative: 7 %
Neutro Abs: 3.4 10*3/uL (ref 1.7–7.7)
Neutrophils Relative %: 66 %
Platelet Count: 306 10*3/uL (ref 150–400)
RBC: 3.09 MIL/uL — ABNORMAL LOW (ref 4.22–5.81)
RDW: 13.5 % (ref 11.5–15.5)
WBC Count: 5.1 10*3/uL (ref 4.0–10.5)
nRBC: 0 % (ref 0.0–0.2)

## 2021-02-14 MED ORDER — PALONOSETRON HCL INJECTION 0.25 MG/5ML
0.2500 mg | Freq: Once | INTRAVENOUS | Status: AC
  Administered 2021-02-14: 0.25 mg via INTRAVENOUS

## 2021-02-14 MED ORDER — DIPHENHYDRAMINE HCL 50 MG/ML IJ SOLN
INTRAMUSCULAR | Status: AC
  Filled 2021-02-14: qty 1

## 2021-02-14 MED ORDER — FAMOTIDINE 20 MG IN NS 100 ML IVPB
20.0000 mg | Freq: Once | INTRAVENOUS | Status: AC
  Administered 2021-02-14: 20 mg via INTRAVENOUS

## 2021-02-14 MED ORDER — SODIUM CHLORIDE 0.9 % IV SOLN
174.0000 mg | Freq: Once | INTRAVENOUS | Status: AC
  Administered 2021-02-14: 170 mg via INTRAVENOUS
  Filled 2021-02-14: qty 17

## 2021-02-14 MED ORDER — PALONOSETRON HCL INJECTION 0.25 MG/5ML
INTRAVENOUS | Status: AC
  Filled 2021-02-14: qty 5

## 2021-02-14 MED ORDER — FAMOTIDINE 20 MG IN NS 100 ML IVPB
INTRAVENOUS | Status: AC
  Filled 2021-02-14: qty 100

## 2021-02-14 MED ORDER — DIPHENHYDRAMINE HCL 50 MG/ML IJ SOLN
50.0000 mg | Freq: Once | INTRAMUSCULAR | Status: AC
  Administered 2021-02-14: 50 mg via INTRAVENOUS

## 2021-02-14 MED ORDER — SODIUM CHLORIDE 0.9 % IV SOLN
20.0000 mg | Freq: Once | INTRAVENOUS | Status: AC
  Administered 2021-02-14: 20 mg via INTRAVENOUS
  Filled 2021-02-14: qty 2

## 2021-02-14 MED ORDER — SODIUM CHLORIDE 0.9 % IV SOLN
Freq: Once | INTRAVENOUS | Status: AC
  Filled 2021-02-14: qty 250

## 2021-02-14 MED ORDER — SODIUM CHLORIDE 0.9 % IV SOLN
45.0000 mg/m2 | Freq: Once | INTRAVENOUS | Status: AC
  Administered 2021-02-14: 90 mg via INTRAVENOUS
  Filled 2021-02-14: qty 15

## 2021-02-14 NOTE — Patient Instructions (Signed)
[facility]  Discharge Instructions: Thank you for choosing [facility] to provide your oncology and hematology care.   If you have a lab appointment with the Cancer Center, please go directly to the Cancer Center and check in at the registration area.   Wear comfortable clothing and clothing appropriate for easy access to any Portacath or PICC line.   We strive to give you quality time with your provider. You may need to reschedule your appointment if you arrive late (15 or more minutes).  Arriving late affects you and other patients whose appointments are after yours.  Also, if you miss three or more appointments without notifying the office, you may be dismissed from the clinic at the provider's discretion.      For prescription refill requests, have your pharmacy contact our office and allow 72 hours for refills to be completed.    Today you received the following chemotherapy and/or immunotherapy agents: Taxol & Carboplatin   To help prevent nausea and vomiting after your treatment, we encourage you to take your nausea medication as directed.  BELOW ARE SYMPTOMS THAT SHOULD BE REPORTED IMMEDIATELY: *FEVER GREATER THAN 100.4 F (38 C) OR HIGHER *CHILLS OR SWEATING *NAUSEA AND VOMITING THAT IS NOT CONTROLLED WITH YOUR NAUSEA MEDICATION *UNUSUAL SHORTNESS OF BREATH *UNUSUAL BRUISING OR BLEEDING *URINARY PROBLEMS (pain or burning when urinating, or frequent urination) *BOWEL PROBLEMS (unusual diarrhea, constipation, pain near the anus) TENDERNESS IN MOUTH AND THROAT WITH OR WITHOUT PRESENCE OF ULCERS (sore throat, sores in mouth, or a toothache) UNUSUAL RASH, SWELLING OR PAIN  UNUSUAL VAGINAL DISCHARGE OR ITCHING   Items with * indicate a potential emergency and should be followed up as soon as possible or go to the Emergency Department if any problems should occur.  Please show the CHEMOTHERAPY ALERT CARD or IMMUNOTHERAPY ALERT CARD at  check-in to the Emergency Department and triage nurse.  Should you have questions after your visit or need to cancel or reschedule your appointment, please contact [facility]  Dept: [phone]  and follow the prompts.  Office hours are 8:00 a.m. to 4:30 p.m. Monday - Friday. Please note that voicemails left after 4:00 p.m. may not be returned until the following business day.  We are closed weekends and major holidays. You have access to a nurse at all times for urgent questions. Please call the main number to the clinic Dept: [phone] and follow the prompts.   For any non-urgent questions, you may also contact your provider using MyChart. We now offer e-Visits for anyone 18 and older to request care online for non-urgent symptoms. For details visit [email or web address].   Also download the MyChart app! Go to the app store, search "MyChart", open the app, select [facility], and log in with your MyChart username and password.  Due to Covid, a mask is required upon entering the hospital/clinic. If you do not have a mask, one will be given to you upon arrival. For doctor visits, patients may have 1 support person aged 18 or older with them. For treatment visits, patients cannot have anyone with them due to current Covid guidelines and our immunocompromised population.   

## 2021-02-15 ENCOUNTER — Ambulatory Visit
Admission: RE | Admit: 2021-02-15 | Discharge: 2021-02-15 | Disposition: A | Discharge status: Home / Self Care | Payer: Medicare Other | Source: Ambulatory Visit | Attending: Radiation Oncology | Admitting: Radiation Oncology

## 2021-02-15 ENCOUNTER — Ambulatory Visit: Payer: Medicare Other | Admitting: Pulmonary Disease

## 2021-02-15 DIAGNOSIS — C3492 Malignant neoplasm of unspecified part of left bronchus or lung: Secondary | ICD-10-CM

## 2021-02-15 MED ORDER — SONAFINE EX EMUL
1.0000 | Freq: Once | CUTANEOUS | Status: AC
Start: 2021-02-15 — End: 2021-02-15
  Administered 2021-02-15: 1 via TOPICAL

## 2021-02-15 NOTE — Progress Notes (Signed)
Pt here for patient teaching.    Pt given Radiation and You booklet, skin care instructions and Sonafine.    Reviewed areas of pertinence such as fatigue, hair loss, mouth changes, skin changes, throat changes, cough and shortness of breath .   Pt able to give teach back of to pat skin, use unscented/gentle soap and drink plenty of water,apply Sonafine bid and avoid applying anything to skin within 4 hours of treatment.   Pt demonstrated understanding  of information given and will contact nursing with any questions or concerns.    [email or web address]

## 2021-02-16 ENCOUNTER — Ambulatory Visit
Admission: RE | Admit: 2021-02-16 | Discharge: 2021-02-16 | Disposition: A | Discharge status: Home / Self Care | Payer: Medicare Other | Source: Ambulatory Visit | Attending: Radiation Oncology | Admitting: Radiation Oncology

## 2021-02-16 ENCOUNTER — Other Ambulatory Visit: Payer: Medicare Other

## 2021-02-16 ENCOUNTER — Other Ambulatory Visit: Payer: Self-pay

## 2021-02-16 ENCOUNTER — Ambulatory Visit: Payer: Medicare Other

## 2021-02-16 DIAGNOSIS — C3492 Malignant neoplasm of unspecified part of left bronchus or lung: Secondary | ICD-10-CM | POA: Diagnosis not present

## 2021-02-17 ENCOUNTER — Ambulatory Visit
Admission: RE | Admit: 2021-02-17 | Discharge: 2021-02-17 | Disposition: A | Discharge status: Home / Self Care | Payer: Medicare Other | Source: Ambulatory Visit | Attending: Radiation Oncology | Admitting: Radiation Oncology

## 2021-02-17 ENCOUNTER — Encounter: Payer: Self-pay | Admitting: Pulmonary Disease

## 2021-02-17 ENCOUNTER — Ambulatory Visit (INDEPENDENT_AMBULATORY_CARE_PROVIDER_SITE_OTHER): Payer: Medicare Other | Admitting: Pulmonary Disease

## 2021-02-17 VITALS — BP 100/60 | HR 90 | Temp 98.0°F | Wt 178.0 lb

## 2021-02-17 DIAGNOSIS — R059 Cough, unspecified: Secondary | ICD-10-CM | POA: Diagnosis not present

## 2021-02-17 DIAGNOSIS — C3492 Malignant neoplasm of unspecified part of left bronchus or lung: Secondary | ICD-10-CM

## 2021-02-17 DIAGNOSIS — Z87891 Personal history of nicotine dependence: Secondary | ICD-10-CM | POA: Diagnosis not present

## 2021-02-17 NOTE — Progress Notes (Signed)
Synopsis: Referred in May 2022 for lung mass by [name], MD  Subjective:   PATIENT ID: Leon Lyons GENDER: male DOB: 11-Oct-1932, MRN: [id number]  Chief Complaint  Patient presents with  . Follow-up    Has been doing well.  Woke up with morning with nausea.      This is an 87 year old gentleman history of tobacco abuse, quit 1962.  Patient had CT imaging concerning for a lung mass.  He was referred to me for video bronchoscopy.  Initially seen by Dr. Silas Flood in clinic on 12/29/2020.  Bronchoscopy was completed on 01/07/2021 with video bronchoscopy and endobronchial ultrasound.  Biopsies were taken from station 7 as well as left upper lobe anterior segment brushings, endobronchial biopsies and washings.  A tissue diagnosis of non-small cell lung cancer was made.  New diagnosis of stage IIIb, T3, N2, MX.  Plan for chemotherapy plus radiation followed by immunotherapy.  Overall has been doing well for this past month.  From respiratory standpoint patient has no significant complaints.   Past Medical History:  Diagnosis Date  . Lung cancer ([place])   . Tobacco use      No family history on file.   Past Surgical History:  Procedure Laterality Date  . APPENDECTOMY    . BRONCHIAL BIOPSY  01/07/2021   Procedure: BRONCHIAL BIOPSIES;  Surgeon: Garner Nash, DO;  Location: [facility];  Service: Pulmonary;;  . BRONCHIAL BRUSHINGS  01/07/2021   Procedure: BRONCHIAL BRUSHINGS;  Surgeon: Garner Nash, DO;  Location: [place];  Service: Pulmonary;;  . BRONCHIAL NEEDLE ASPIRATION BIOPSY  01/07/2021   Procedure: BRONCHIAL NEEDLE ASPIRATION BIOPSIES;  Surgeon: Garner Nash, DO;  Location: [place];  Service: Pulmonary;;  . BRONCHIAL WASHINGS  01/07/2021   Procedure: BRONCHIAL WASHINGS;  Surgeon: Garner Nash, DO;  Location: [place];  Service: Pulmonary;;  . IRRIGATION AND DEBRIDEMENT SEBACEOUS CYST    . VIDEO BRONCHOSCOPY WITH ENDOBRONCHIAL ULTRASOUND Bilateral  01/07/2021   Procedure: VIDEO BRONCHOSCOPY WITH ENDOBRONCHIAL ULTRASOUND;  Surgeon: Garner Nash, DO;  Location: [place];  Service: Pulmonary;  Laterality: Bilateral;  possible need for radial ultrasound and fluoro     Social History   Socioeconomic History  . Marital status: Married    Spouse name: Not on file  . Number of children: Not on file  . Years of education: Not on file  . Highest education level: Not on file  Occupational History  . Not on file  Tobacco Use  . Smoking status: Former Smoker    Years: 5.00    Types: Cigarettes    Quit date: 1962    Years since quitting: 60.4  . Smokeless tobacco: Never Used  . Tobacco comment: light/occasional smoker back in early 1960's  Vaping Use  . Vaping Use: Never used  Substance and Sexual Activity  . Alcohol use: Not on file  . Drug use: Not on file  . Sexual activity: Not on file  Other Topics Concern  . Not on file  Social History Narrative  . Not on file   Social Determinants of Health   Financial Resource Strain: Not on file  Food Insecurity: Not on file  Transportation Needs: Not on file  Physical Activity: Not on file  Stress: Not on file  Social Connections: Not on file  Intimate Partner Violence: Not on file     No Known Allergies   Outpatient Medications Prior to Visit  Medication Sig Dispense Refill  . acetaminophen (TYLENOL) 325  MG tablet Take 650 mg by mouth every 6 (six) hours as needed for moderate pain or headache.    . Ascorbic Acid (VITAMIN C) 1000 MG tablet Take 1,000 mg by mouth once a week.    . cholecalciferol (VITAMIN D3) 25 MCG (1000 UNIT) tablet Take 1,000 Units by mouth once a week.    Marland Kitchen Dextromethorphan-guaiFENesin 5-100 MG/5ML LIQD Take by mouth.    Marland Kitchen guaiFENesin (MUCINEX) 600 MG 12 hr tablet Take 600 mg by mouth 1 day or 1 dose.    . Magnesium 400 MG CAPS Take 400 mg by mouth once a week.    . Multiple Vitamin (MULTIVITAMIN WITH MINERALS) TABS tablet Take 1 tablet by mouth once  a week.    . Multiple Vitamins-Minerals (AIRBORNE PO) Take 1 tablet by mouth once a week.    . naproxen sodium (ALEVE) 220 MG tablet Take 220 mg by mouth daily as needed (pain).    . Omega-3 Fatty Acids (FISH OIL ULTRA) 1400 MG CAPS Take 1,400 mg by mouth once a week.    . polyethylene glycol (MIRALAX / GLYCOLAX) 17 g packet Take 17 g by mouth daily. 14 each 0  . prochlorperazine (COMPAZINE) 10 MG tablet Take 1 tablet (10 mg total) by mouth every 6 (six) hours as needed for nausea or vomiting. 30 tablet 0  . vitamin E 180 MG (400 UNITS) capsule Take 400 Units by mouth once a week.     No facility-administered medications prior to visit.    Review of Systems  Constitutional: Negative for chills, fever, malaise/fatigue and weight loss.  HENT: Negative for hearing loss, sore throat and tinnitus.   Eyes: Negative for blurred vision and double vision.  Respiratory: Negative for cough, hemoptysis, sputum production, shortness of breath, wheezing and stridor.   Cardiovascular: Negative for chest pain, palpitations, orthopnea, leg swelling and PND.  Gastrointestinal: Negative for abdominal pain, constipation, diarrhea, heartburn, nausea and vomiting.  Genitourinary: Negative for dysuria, hematuria and urgency.  Musculoskeletal: Negative for joint pain and myalgias.  Skin: Negative for itching and rash.  Neurological: Negative for dizziness, tingling, weakness and headaches.  Endo/Heme/Allergies: Negative for environmental allergies. Does not bruise/bleed easily.  Psychiatric/Behavioral: Negative for depression. The patient is not nervous/anxious and does not have insomnia.   All other systems reviewed and are negative.    Objective:  Physical Exam Vitals reviewed.  Constitutional:      General: He is not in acute distress.    Appearance: He is well-developed.  HENT:     Head: Normocephalic and atraumatic.  Eyes:     General: No scleral icterus.    Conjunctiva/sclera: Conjunctivae  normal.     Pupils: Pupils are equal, round, and reactive to light.  Neck:     Vascular: No JVD.     Trachea: No tracheal deviation.  Cardiovascular:     Rate and Rhythm: Normal rate and regular rhythm.     Heart sounds: Normal heart sounds. No murmur heard.   Pulmonary:     Effort: Pulmonary effort is normal. No tachypnea, accessory muscle usage or respiratory distress.     Breath sounds: No stridor. No wheezing, rhonchi or rales.  Abdominal:     General: Bowel sounds are normal. There is no distension.     Palpations: Abdomen is soft.     Tenderness: There is no abdominal tenderness.  Musculoskeletal:        General: No tenderness.     Cervical back: Neck supple.  Lymphadenopathy:  Cervical: No cervical adenopathy.  Skin:    General: Skin is warm and dry.     Capillary Refill: Capillary refill takes less than 2 seconds.     Findings: No rash.  Neurological:     Mental Status: He is alert and oriented to person, place, and time.  Psychiatric:        Behavior: Behavior normal.      Vitals:   02/17/21 1146  BP: 100/60  Pulse: 90  Temp: 98 F (36.7 C)  TempSrc: Tympanic  SpO2: 98%  Weight: 178 lb (80.7 kg)   98% on RA BMI Readings from Last 3 Encounters:  02/17/21 27.06 kg/m  02/08/21 27.29 kg/m  02/04/21 27.37 kg/m   Wt Readings from Last 3 Encounters:  02/17/21 178 lb (80.7 kg)  02/08/21 179 lb 8 oz (81.4 kg)  02/04/21 180 lb (81.6 kg)     CBC    Component Value Date/Time   WBC 5.1 02/14/2021 1240   RBC 3.09 (L) 02/14/2021 1240   HGB 9.8 (L) 02/14/2021 1240   HCT 31.5 (L) 02/14/2021 1240   PLT 306 02/14/2021 1240   MCV 101.9 (H) 02/14/2021 1240   MCH 31.7 02/14/2021 1240   MCHC 31.1 02/14/2021 1240   RDW 13.5 02/14/2021 1240   LYMPHSABS 1.3 02/14/2021 1240   MONOABS 0.3 02/14/2021 1240   EOSABS 0.0 02/14/2021 1240   BASOSABS 0.0 02/14/2021 1240    Chest Imaging: 01/24/2021: Centrally obstructing left upper lobe mass concerning for  stage IIIb bronchogenic carcinoma. Hypermetabolic soft tissue nodule in the L2 vertebra difficult to exclude metastatic disease. The patient's images have been independently reviewed by me.    Pulmonary Functions Testing Results: No flowsheet data found.  FeNO:   Pathology:   Echocardiogram:   Heart Catheterization:     Assessment & Plan:     ICD-10-CM   1. Non-small cell carcinoma of left lung, stage 3 (HCC)  C34.92   2. Former smoker  Z87.891   3. Cough  R05.9     Discussion:  This is a 87 year old male, stage III lung cancer new diagnosis.  Plan to undergo concurrent chemo plus XRT.  From a respiratory standpoint doing well.  Plan:  Recommend the patient follow-up with [place] in approximately 6 months. If he has any changes in respiratory status medical oncology or radiation oncology encouraged to reach out. Stable not on any inhalers at this time but if this changes may benefit from being on a maintenance inhaler of some type. He does have evidence of emphysema on CT imaging but no formal PFTs in the past.  RTC in 6 months    Current Outpatient Medications:  .  acetaminophen (TYLENOL) 325 MG tablet, Take 650 mg by mouth every 6 (six) hours as needed for moderate pain or headache., Disp: , Rfl:  .  Ascorbic Acid (VITAMIN C) 1000 MG tablet, Take 1,000 mg by mouth once a week., Disp: , Rfl:  .  cholecalciferol (VITAMIN D3) 25 MCG (1000 UNIT) tablet, Take 1,000 Units by mouth once a week., Disp: , Rfl:  .  Dextromethorphan-guaiFENesin 5-100 MG/5ML LIQD, Take by mouth., Disp: , Rfl:  .  guaiFENesin (MUCINEX) 600 MG 12 hr tablet, Take 600 mg by mouth 1 day or 1 dose., Disp: , Rfl:  .  Magnesium 400 MG CAPS, Take 400 mg by mouth once a week., Disp: , Rfl:  .  Multiple Vitamin (MULTIVITAMIN WITH MINERALS) TABS tablet, Take 1 tablet by mouth once a week.,  Disp: , Rfl:  .  Multiple Vitamins-Minerals (AIRBORNE PO), Take 1 tablet by mouth once a week., Disp: , Rfl:  .  naproxen  sodium (ALEVE) 220 MG tablet, Take 220 mg by mouth daily as needed (pain)., Disp: , Rfl:  .  Omega-3 Fatty Acids (FISH OIL ULTRA) 1400 MG CAPS, Take 1,400 mg by mouth once a week., Disp: , Rfl:  .  polyethylene glycol (MIRALAX / GLYCOLAX) 17 g packet, Take 17 g by mouth daily., Disp: 14 each, Rfl: 0 .  prochlorperazine (COMPAZINE) 10 MG tablet, Take 1 tablet (10 mg total) by mouth every 6 (six) hours as needed for nausea or vomiting., Disp: 30 tablet, Rfl: 0 .  vitamin E 180 MG (400 UNITS) capsule, Take 400 Units by mouth once a week., Disp: , Rfl:    Garner Nash, DO [facility] 02/17/2021 7:20 PM

## 2021-02-17 NOTE — Patient Instructions (Signed)
Thank you for visiting Dr. Valeta Harms at [facility]. Today we recommend the following:  Return in about 6 months (around 08/20/2021) for with APP or Dr. Valeta Harms.    Please do your part to reduce the spread of COVID-19.

## 2021-02-18 ENCOUNTER — Ambulatory Visit
Admission: RE | Admit: 2021-02-18 | Discharge: 2021-02-18 | Disposition: A | Discharge status: Home / Self Care | Payer: Medicare Other | Source: Ambulatory Visit | Attending: Radiation Oncology | Admitting: Radiation Oncology

## 2021-02-18 ENCOUNTER — Other Ambulatory Visit: Payer: Self-pay

## 2021-02-18 DIAGNOSIS — C3492 Malignant neoplasm of unspecified part of left bronchus or lung: Secondary | ICD-10-CM | POA: Diagnosis not present

## 2021-02-18 NOTE — Progress Notes (Signed)
[facility] OFFICE PROGRESS NOTE  [name], MD [place] [address]  DIAGNOSIS: StageIIIBnon-small cell lung cancer,morphology andimmunophenotype is consistent with poorly differentiated squamous cell carcinoma. He presented witha centrally obstructing left upper lobe mass with contralateral mediastinal lymphadenopathy.There is also an indeterminate right paramidline soft tissue nodule in the L2 vertebrae which is indeterminate. He was diagnosed in April 2022.  PDL1 Expression: 0%  PRIOR THERAPY: None  CURRENT THERAPY: Weekly concurrent chemoradiation with carboplatin for AUC of 2 and paclitaxel 45 mg per metered squared. First dose on 02/02/21. Status post 3 cycles.   INTERVAL HISTORY: Leon Lyons 87 y.o. adult returns to clinic for a follow-up visit accompanied by his wife.  The patient is feeling well today without any concerning complaints except for a mild cough at night that is dry. He uses robitussin which works well for him. Otherwise, the patient had a follow-up appointment with pulmonology on 02/17/2021 and is doing well from a respiratory standpoint.  The patient denies any fever, chills, night sweats, or weight loss. He does not have an appetite but makes sure to eat and has been maintaining his weight. Certain foods do not taste good. He has been drinking 1 protein drink per day. He denies any chest pain, shortness of breath, or hemoptysis. He has some mild nausea but it is controlled with his anti-emetic.  He denies any vomiting, diarrhea, or constipation.  He denies any headache or visual changes.  The patient's last day radiation is scheduled for 03/24/2021.  The patient is here today for evaluation before starting cycle #4.  MEDICAL HISTORY: Past Medical History:  Diagnosis Date  . Lung cancer ([place])   . Tobacco use     ALLERGIES:  has No Known Allergies.  MEDICATIONS:  Current Outpatient Medications   Medication Sig Dispense Refill  . acetaminophen (TYLENOL) 325 MG tablet Take 650 mg by mouth every 6 (six) hours as needed for moderate pain or headache.    . Ascorbic Acid (VITAMIN C) 1000 MG tablet Take 1,000 mg by mouth once a week.    . cholecalciferol (VITAMIN D3) 25 MCG (1000 UNIT) tablet Take 1,000 Units by mouth once a week.    Marland Kitchen Dextromethorphan-guaiFENesin 5-100 MG/5ML LIQD Take by mouth.    Marland Kitchen guaiFENesin (MUCINEX) 600 MG 12 hr tablet Take 600 mg by mouth 1 day or 1 dose.    . Magnesium 400 MG CAPS Take 400 mg by mouth once a week.    . Multiple Vitamin (MULTIVITAMIN WITH MINERALS) TABS tablet Take 1 tablet by mouth once a week.    . Multiple Vitamins-Minerals (AIRBORNE PO) Take 1 tablet by mouth once a week.    . naproxen sodium (ALEVE) 220 MG tablet Take 220 mg by mouth daily as needed (pain).    . Omega-3 Fatty Acids (FISH OIL ULTRA) 1400 MG CAPS Take 1,400 mg by mouth once a week.    . polyethylene glycol (MIRALAX / GLYCOLAX) 17 g packet Take 17 g by mouth daily. 14 each 0  . prochlorperazine (COMPAZINE) 10 MG tablet Take 1 tablet (10 mg total) by mouth every 6 (six) hours as needed for nausea or vomiting. 30 tablet 0  . vitamin E 180 MG (400 UNITS) capsule Take 400 Units by mouth once a week.     No current facility-administered medications for this visit.    SURGICAL HISTORY:  Past Surgical History:  Procedure Laterality Date  . APPENDECTOMY    .  BRONCHIAL BIOPSY  01/07/2021   Procedure: BRONCHIAL BIOPSIES;  Surgeon: Garner Nash, DO;  Location: [facility];  Service: Pulmonary;;  . BRONCHIAL BRUSHINGS  01/07/2021   Procedure: BRONCHIAL BRUSHINGS;  Surgeon: Garner Nash, DO;  Location: [facility];  Service: Pulmonary;;  . BRONCHIAL NEEDLE ASPIRATION BIOPSY  01/07/2021   Procedure: BRONCHIAL NEEDLE ASPIRATION BIOPSIES;  Surgeon: Garner Nash, DO;  Location: [facility];  Service: Pulmonary;;  . BRONCHIAL WASHINGS  01/07/2021   Procedure: BRONCHIAL WASHINGS;   Surgeon: Garner Nash, DO;  Location: [facility];  Service: Pulmonary;;  . IRRIGATION AND DEBRIDEMENT SEBACEOUS CYST    . VIDEO BRONCHOSCOPY WITH ENDOBRONCHIAL ULTRASOUND Bilateral 01/07/2021   Procedure: VIDEO BRONCHOSCOPY WITH ENDOBRONCHIAL ULTRASOUND;  Surgeon: Garner Nash, DO;  Location: [facility];  Service: Pulmonary;  Laterality: Bilateral;  possible need for radial ultrasound and fluoro     REVIEW OF SYSTEMS:   Review of Systems  Constitutional: Positive for decreased appetite. Negative for chills, fatigue, fever and unexpected weight change.  HENT: Positive for taste changes. Negative for mouth sores, nosebleeds, sore throat and trouble swallowing.   Eyes: Negative for eye problems and icterus.  Respiratory: Positive for mild cough. Negative for hemoptysis, shortness of breath and wheezing.   Cardiovascular: Negative for chest pain and leg swelling.  Gastrointestinal: Positive for mild nausea (resolved). Negative for abdominal pain, constipation, diarrhea, and vomiting.  Genitourinary: Negative for bladder incontinence, difficulty urinating, dysuria, frequency and hematuria.   Musculoskeletal: Negative for back pain, gait problem, neck pain and neck stiffness.  Skin: Negative for itching and rash.  Neurological: Negative for dizziness, extremity weakness, gait problem, headaches, light-headedness and seizures.  Hematological: Negative for adenopathy. Does not bruise/bleed easily.  Psychiatric/Behavioral: Negative for confusion, depression and sleep disturbance. The patient is not nervous/anxious.     PHYSICAL EXAMINATION:  Blood pressure 108/67, pulse 89, temperature 97.6 F (36.4 C), temperature source Tympanic, resp. rate 17, height $RemoveBe'5\' 8"'EhdVjCXIE$  (1.727 m), weight 178 lb 1.6 oz (80.8 kg), SpO2 100 %.  ECOG PERFORMANCE STATUS: 1 - Symptomatic but completely ambulatory  Physical Exam  Constitutional: Oriented to person, place, and time and well-developed, well-nourished, and  in no distress.  HENT:  Head: Normocephalic and atraumatic.  Mouth/Throat: Oropharynx is clear and moist. No oropharyngeal exudate.  Eyes: Conjunctivae are normal. Right eye exhibits no discharge. Left eye exhibits no discharge. No scleral icterus.  Neck: Normal range of motion. Neck supple.  Cardiovascular: Normal rate, regular rhythm, normal heart sounds and intact distal pulses.   Pulmonary/Chest: Effort normal and breath sounds normal. No respiratory distress. No wheezes. No rales.  Abdominal: Soft. Bowel sounds are normal. Exhibits no distension and no mass. There is no tenderness.  Musculoskeletal: Normal range of motion. Exhibits no edema.  Lymphadenopathy:    No cervical adenopathy.  Neurological: Alert and oriented to person, place, and time. Exhibits normal muscle tone. Gait normal. Coordination normal.  Skin: Skin is warm and dry. No rash noted. Not diaphoretic. No erythema. No pallor.  Psychiatric: Mood, memory and judgment normal.  Vitals reviewed.  LABORATORY DATA: Lab Results  Component Value Date   WBC 2.8 (L) 02/21/2021   HGB 9.7 (L) 02/21/2021   HCT 30.1 (L) 02/21/2021   MCV 101.3 (H) 02/21/2021   PLT 206 02/21/2021      Chemistry      Component Value Date/Time   NA 134 (L) 02/21/2021 0906   K 4.4 02/21/2021 0906   CL 102 02/21/2021 0906   CO2  26 02/21/2021 0906   BUN 20 02/21/2021 0906   CREATININE 0.99 02/21/2021 0906      Component Value Date/Time   CALCIUM 9.1 02/21/2021 0906   ALKPHOS 53 02/21/2021 0906   AST 17 02/21/2021 0906   ALT 7 02/21/2021 0906   BILITOT 0.3 02/21/2021 0906       RADIOGRAPHIC STUDIES:  MR BRAIN W WO CONTRAST  Result Date: 01/24/2021 CLINICAL DATA:  Lung cancer, staging EXAM: MRI HEAD WITHOUT AND WITH CONTRAST TECHNIQUE: Multiplanar, multiecho pulse sequences of the brain and surrounding structures were obtained without and with intravenous contrast. CONTRAST:  44mL GADAVIST GADOBUTROL 1 MMOL/ML IV SOLN COMPARISON:  None.  FINDINGS: Brain: There is no acute infarction or intracranial hemorrhage. A small cortical/subcortical focus of diffusion hyperintensity with ADC isointensity in the left occipital lobe is present without associated enhancement. Suspect very small left falcine meningioma measuring 4 mm (series 20, image 136; series 11, image 46). There is no mass effect or edema. There is no hydrocephalus or extra-axial fluid collection. Ventricles and sulci are within normal limits in size and configuration. Patchy foci of T2 hyperintensity in the supratentorial and pontine white matter are nonspecific but may reflect mild to moderate chronic microvascular ischemic changes. 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 Few small chronic cerebral white matter infarcts. No abnormal enhancement. Vascular: Major vessel flow voids at the skull base are preserved. Skull and upper cervical spine: Normal marrow signal is preserved. Sinuses/Orbits: Minor mucosal thickening.  Orbits are unremarkable. Other: Sella is unremarkable.  Mastoid air cells are clear. IMPRESSION: No evidence of intracranial metastatic disease. Small left occipital subacute infarct. Probable small meningioma along the falx. Electronically Signed   By: Macy Mis M.D.   On: 01/24/2021 10:09   NM PET Image Initial (PI) Skull Base To Thigh  Result Date: 01/24/2021 CLINICAL DATA:  Initial treatment strategy for lung cancer. EXAM: NUCLEAR MEDICINE PET SKULL BASE TO THIGH TECHNIQUE: 8.9 mCi F-18 FDG was injected intravenously. Full-ring PET imaging was performed from the skull base to thigh after the radiotracer. CT data was obtained and used for attenuation correction and anatomic localization. Fasting blood glucose: 97 mg/dl COMPARISON:  CT chest 12/28/2020. FINDINGS: Mediastinal blood pool activity: SUV max 1.9 Liver activity: SUV max NA NECK: No abnormal hypermetabolism. Incidental CT findings: None. CHEST: Mildly asymmetric hypermetabolism in  the left lobe of the thyroid, SUV max 3.6. Hypermetabolic mediastinal lymph nodes are seen bilaterally. Index right paratracheal lymph node measures 12 mm (4/60) with an SUV max of 15.1. Hypermetabolic left hilar adenopathy with a central left upper lobe hypermetabolic mass best measured on 12/28/2020, SUV max of 11.3. Incidental CT findings: Atherosclerotic calcification of the aorta, aortic valve and coronary arteries. Aberrant right subclavian artery. Heart is enlarged. No pericardial effusion. Obstruction of the left upper lobe bronchus with post obstructive collapse/consolidation in the left upper lobe, increased from 12/28/2020. ABDOMEN/PELVIS: No abnormal hypermetabolism in the liver, adrenal glands, spleen or pancreas. No hypermetabolic lymph nodes. Incidental CT findings: Liver is unremarkable. Stones are seen in the gallbladder. Slight nodular thickening of both adrenal glands. Stones are seen in the right kidney. Marked atrophy in the left kidney with hydronephrosis the level of the ureteropelvic junction. Spleen and pancreas are unremarkable. Proximal gastric wall thickening. Scattered ascites. Bladder wall thickening. Prostate is enlarged. Cystic structure in the small bowel mesentery has areas of peripheral calcification, measuring 5.8 x 6.2 cm. There are low-density fluid collection along the subcutaneous ventral midline abdominal wall,  which may be due to prior surgery. Atherosclerotic calcification of the aorta. SKELETON: There may be a 13 mm right paramidline soft tissue nodule posterior to the L2 vertebral body (4/123) with an SUV max of 5.0. No hypermetabolic osseous lesions. Incidental CT findings: Degenerative changes in the spine. IMPRESSION: 1. Centrally obstructing hypermetabolic left upper lobe mass with hypermetabolic adenopathy extending to the contralateral mediastinum. Findings are most consistent with stage IIIB primary bronchogenic carcinoma. 2. Hypermetabolic right paramidline soft  tissue nodule posterior to the L2 vertebral body, indeterminate. Finding may be degenerative. Difficult to exclude a metastasis. 3. Peripherally calcified cystic lesion in the small bowel mesentery without associated hypermetabolism, favoring a benign lesion. 4. Asymmetric hypermetabolism in the left lobe of the thyroid. Consider thyroid ultrasound in further evaluation, as clinically indicated. 5. Cholelithiasis. 6. Right renal stones. 7. Left renal atrophy with hydronephrosis, suggesting a chronic UPJ obstruction. 8. Proximal gastric wall thickening. 9. Enlarged prostate. 10. Aortic atherosclerosis (ICD10-I70.0). Coronary artery calcification. Electronically Signed   By: Lorin Picket M.D.   On: 01/24/2021 10:46     ASSESSMENT/PLAN:  This is a very pleasant 87 year old male diagnosed with stageIIIBnon-small cell lung cancer, most consistent with squamous cell carcinoma.He presented with a centrally obstructing left upper lobe lung mass with contralateral mediastinal lymphadenopathy. There is also an indeterminate midline soft tissue nodule posterior to the L2 vertebrae which is indeterminate.He was diagnosed in April 2022. His PD-L1 expression was 0%.  The patient is currently undergoing concurrent chemoradiation with carboplatin for an AUC of 2, and paclitaxel 45 mg per metered square.  He his first dose was given on 02/02/2021. He is status post 3 cycles. The patient is undergoing radiation under the care of Dr. Sondra Come and his last day of treatment is scheduled for 03/24/2021.  Labs reviewed.  Recommend he proceed with cycle #4 today scheduled.  We will see him back for follow-up visit in 2 weeks before starting cycle #6.  I reviewed a port-a-cath with the patient due to concerns with mild bruising from his last infusion. He is not interested in a port a cath at this time.   He was instructed to increase his protein intake.   He will continue to use robitussin for his cough at night.    The patient was advised to call immediately if he has any concerning symptoms in the interval. The patient voices understanding of current disease status and treatment options and is in agreement with the current care plan. All questions were answered. The patient knows to call the clinic with any problems, questions or concerns. We can certainly see the patient much sooner if necessary       No orders of the defined types were placed in this encounter.    I spent 20-29 minutes in this encounter.   Christoffer Currier L Shabria Egley, PA-C 02/21/21

## 2021-02-21 ENCOUNTER — Inpatient Hospital Stay: Payer: Medicare Other

## 2021-02-21 ENCOUNTER — Inpatient Hospital Stay (HOSPITAL_BASED_OUTPATIENT_CLINIC_OR_DEPARTMENT_OTHER): Payer: Medicare Other | Admitting: Physician Assistant

## 2021-02-21 ENCOUNTER — Ambulatory Visit
Admission: RE | Admit: 2021-02-21 | Discharge: 2021-02-21 | Disposition: A | Discharge status: Home / Self Care | Payer: Medicare Other | Source: Ambulatory Visit | Attending: Radiation Oncology | Admitting: Radiation Oncology

## 2021-02-21 ENCOUNTER — Other Ambulatory Visit: Payer: Self-pay

## 2021-02-21 ENCOUNTER — Other Ambulatory Visit: Payer: Medicare Other

## 2021-02-21 VITALS — BP 108/67 | HR 89 | Temp 97.6°F | Resp 17 | Ht 68.0 in | Wt 178.1 lb

## 2021-02-21 DIAGNOSIS — Z5111 Encounter for antineoplastic chemotherapy: Secondary | ICD-10-CM

## 2021-02-21 DIAGNOSIS — C3492 Malignant neoplasm of unspecified part of left bronchus or lung: Secondary | ICD-10-CM | POA: Diagnosis not present

## 2021-02-21 LAB — CBC WITH DIFFERENTIAL (CANCER CENTER ONLY)
Abs Immature Granulocytes: 0.03 10*3/uL (ref 0.00–0.07)
Basophils Absolute: 0 10*3/uL (ref 0.0–0.1)
Basophils Relative: 1 %
Eosinophils Absolute: 0 10*3/uL (ref 0.0–0.5)
Eosinophils Relative: 0 %
HCT: 30.1 % — ABNORMAL LOW (ref 39.0–52.0)
Hemoglobin: 9.7 g/dL — ABNORMAL LOW (ref 13.0–17.0)
Immature Granulocytes: 1 %
Lymphocytes Relative: 31 %
Lymphs Abs: 0.9 10*3/uL (ref 0.7–4.0)
MCH: 32.7 pg (ref 26.0–34.0)
MCHC: 32.2 g/dL (ref 30.0–36.0)
MCV: 101.3 fL — ABNORMAL HIGH (ref 80.0–100.0)
Monocytes Absolute: 0.2 10*3/uL (ref 0.1–1.0)
Monocytes Relative: 7 %
Neutro Abs: 1.7 10*3/uL (ref 1.7–7.7)
Neutrophils Relative %: 60 %
Platelet Count: 206 10*3/uL (ref 150–400)
RBC: 2.97 MIL/uL — ABNORMAL LOW (ref 4.22–5.81)
RDW: 13.7 % (ref 11.5–15.5)
WBC Count: 2.8 10*3/uL — ABNORMAL LOW (ref 4.0–10.5)
nRBC: 0 % (ref 0.0–0.2)

## 2021-02-21 LAB — CMP (CANCER CENTER ONLY)
ALT: 7 U/L (ref 0–44)
AST: 17 U/L (ref 15–41)
Albumin: 2.5 g/dL — ABNORMAL LOW (ref 3.5–5.0)
Alkaline Phosphatase: 53 U/L (ref 38–126)
Anion gap: 6 (ref 5–15)
BUN: 20 mg/dL (ref 8–23)
CO2: 26 mmol/L (ref 22–32)
Calcium: 9.1 mg/dL (ref 8.9–10.3)
Chloride: 102 mmol/L (ref 98–111)
Creatinine: 0.99 mg/dL (ref 0.61–1.24)
GFR, Estimated: >60 mL/min (ref >60)
Glucose, Bld: 84 mg/dL (ref 70–99)
Potassium: 4.4 mmol/L (ref 3.5–5.1)
Sodium: 134 mmol/L — ABNORMAL LOW (ref 135–145)
Total Bilirubin: 0.3 mg/dL (ref 0.3–1.2)
Total Protein: 8.2 g/dL — ABNORMAL HIGH (ref 6.5–8.1)

## 2021-02-21 MED ORDER — SODIUM CHLORIDE 0.9 % IV SOLN
Freq: Once | INTRAVENOUS | Status: AC
  Filled 2021-02-21: qty 250

## 2021-02-21 MED ORDER — FAMOTIDINE 20 MG IN NS 100 ML IVPB
20.0000 mg | Freq: Once | INTRAVENOUS | Status: AC
  Administered 2021-02-21: 20 mg via INTRAVENOUS

## 2021-02-21 MED ORDER — SODIUM CHLORIDE 0.9 % IV SOLN
20.0000 mg | Freq: Once | INTRAVENOUS | Status: AC
  Administered 2021-02-21: 20 mg via INTRAVENOUS
  Filled 2021-02-21: qty 20

## 2021-02-21 MED ORDER — SODIUM CHLORIDE 0.9 % IV SOLN
174.0000 mg | Freq: Once | INTRAVENOUS | Status: AC
  Administered 2021-02-21: 170 mg via INTRAVENOUS
  Filled 2021-02-21: qty 17

## 2021-02-21 MED ORDER — PALONOSETRON HCL INJECTION 0.25 MG/5ML
INTRAVENOUS | Status: AC
  Filled 2021-02-21: qty 5

## 2021-02-21 MED ORDER — DIPHENHYDRAMINE HCL 50 MG/ML IJ SOLN
50.0000 mg | Freq: Once | INTRAMUSCULAR | Status: AC
  Administered 2021-02-21: 50 mg via INTRAVENOUS

## 2021-02-21 MED ORDER — PALONOSETRON HCL INJECTION 0.25 MG/5ML
0.2500 mg | Freq: Once | INTRAVENOUS | Status: AC
  Administered 2021-02-21: 0.25 mg via INTRAVENOUS

## 2021-02-21 MED ORDER — SODIUM CHLORIDE 0.9 % IV SOLN
45.0000 mg/m2 | Freq: Once | INTRAVENOUS | Status: AC
  Administered 2021-02-21: 90 mg via INTRAVENOUS
  Filled 2021-02-21: qty 15

## 2021-02-21 MED ORDER — FAMOTIDINE 20 MG IN NS 100 ML IVPB
INTRAVENOUS | Status: AC
  Filled 2021-02-21: qty 100

## 2021-02-21 MED ORDER — DIPHENHYDRAMINE HCL 50 MG/ML IJ SOLN
INTRAMUSCULAR | Status: AC
  Filled 2021-02-21: qty 1

## 2021-02-21 NOTE — Patient Instructions (Signed)
[facility]COL[facility]  Discharge Instructions: Thank you for choosing [facility] to provide your oncology and hematology care.   If you have a lab appointment with the Cancer Center, please go directly to the Cancer Center and check in at the registration area.   Wear comfortable clothing and clothing appropriate for easy access to any Portacath or PICC line.   We strive to give you quality time with your provider. You may need to reschedule your appointment if you arrive late (15 or more minutes).  Arriving late affects you and other patients whose appointments are after yours.  Also, if you miss three or more appointments without notifying the office, you may be dismissed from the clinic at the provider's discretion.      For prescription refill requests, have your pharmacy contact our office and allow 72 hours for refills to be completed.    Today you received the following chemotherapy and/or immunotherapy agents: Taxol & Carboplatin   To help prevent nausea and vomiting after your treatment, we encourage you to take your nausea medication as directed.  BELOW ARE SYMPTOMS THAT SHOULD BE REPORTED IMMEDIATELY: *FEVER GREATER THAN 100.4 F (38 C) OR HIGHER *CHILLS OR SWEATING *NAUSEA AND VOMITING THAT IS NOT CONTROLLED WITH YOUR NAUSEA MEDICATION *UNUSUAL SHORTNESS OF BREATH *UNUSUAL BRUISING OR BLEEDING *URINARY PROBLEMS (pain or burning when urinating, or frequent urination) *BOWEL PROBLEMS (unusual diarrhea, constipation, pain near the anus) TENDERNESS IN MOUTH AND THROAT WITH OR WITHOUT PRESENCE OF ULCERS (sore throat, sores in mouth, or a toothache) UNUSUAL RASH, SWELLING OR PAIN  UNUSUAL VAGINAL DISCHARGE OR ITCHING   Items with * indicate a potential emergency and should be followed up as soon as possible or go to the Emergency Department if any problems should occur.  Please show the CHEMOTHERAPY ALERT CARD or IMMUNOTHERAPY ALERT CARD at  check-in to the Emergency Department and triage nurse.  Should you have questions after your visit or need to cancel or reschedule your appointment, please contact [facility]  Dept: [phone]  and follow the prompts.  Office hours are 8:00 a.m. to 4:30 p.m. Monday - Friday. Please note that voicemails left after 4:00 p.m. may not be returned until the following business day.  We are closed weekends and major holidays. You have access to a nurse at all times for urgent questions. Please call the main number to the clinic Dept: [phone] and follow the prompts.   For any non-urgent questions, you may also contact your provider using MyChart. We now offer e-Visits for anyone 18 and older to request care online for non-urgent symptoms. For details visit [email or web address].   Also download the MyChart app! Go to the app store, search "MyChart", open the app, select [facility], and log in with your MyChart username and password.  Due to Covid, a mask is required upon entering the hospital/clinic. If you do not have a mask, one will be given to you upon arrival. For doctor visits, patients may have 1 support person aged 18 or older with them. For treatment visits, patients cannot have anyone with them due to current Covid guidelines and our immunocompromised population.   

## 2021-02-22 ENCOUNTER — Ambulatory Visit
Admission: RE | Admit: 2021-02-22 | Discharge: 2021-02-22 | Disposition: A | Discharge status: Home / Self Care | Payer: Medicare Other | Source: Ambulatory Visit | Attending: Radiation Oncology | Admitting: Radiation Oncology

## 2021-02-22 DIAGNOSIS — C3492 Malignant neoplasm of unspecified part of left bronchus or lung: Secondary | ICD-10-CM | POA: Diagnosis not present

## 2021-02-23 ENCOUNTER — Other Ambulatory Visit: Payer: Self-pay

## 2021-02-23 ENCOUNTER — Ambulatory Visit
Admission: RE | Admit: 2021-02-23 | Discharge: 2021-02-23 | Disposition: A | Discharge status: Home / Self Care | Payer: Medicare Other | Source: Ambulatory Visit | Attending: Radiation Oncology | Admitting: Radiation Oncology

## 2021-02-23 ENCOUNTER — Other Ambulatory Visit: Payer: Medicare Other

## 2021-02-23 ENCOUNTER — Ambulatory Visit: Payer: Medicare Other

## 2021-02-23 ENCOUNTER — Ambulatory Visit: Payer: Medicare Other | Admitting: Internal Medicine

## 2021-02-23 DIAGNOSIS — C3492 Malignant neoplasm of unspecified part of left bronchus or lung: Secondary | ICD-10-CM | POA: Diagnosis not present

## 2021-02-24 ENCOUNTER — Ambulatory Visit
Admission: RE | Admit: 2021-02-24 | Discharge: 2021-02-24 | Disposition: A | Discharge status: Home / Self Care | Payer: Medicare Other | Source: Ambulatory Visit | Attending: Radiation Oncology | Admitting: Radiation Oncology

## 2021-02-24 DIAGNOSIS — C3492 Malignant neoplasm of unspecified part of left bronchus or lung: Secondary | ICD-10-CM | POA: Diagnosis not present

## 2021-02-25 ENCOUNTER — Ambulatory Visit
Admission: RE | Admit: 2021-02-25 | Discharge: 2021-02-25 | Disposition: A | Discharge status: Home / Self Care | Payer: Medicare Other | Source: Ambulatory Visit | Attending: Radiation Oncology | Admitting: Radiation Oncology

## 2021-02-25 ENCOUNTER — Other Ambulatory Visit: Payer: Self-pay

## 2021-02-25 DIAGNOSIS — C3492 Malignant neoplasm of unspecified part of left bronchus or lung: Secondary | ICD-10-CM | POA: Diagnosis not present

## 2021-02-28 ENCOUNTER — Ambulatory Visit
Admission: RE | Admit: 2021-02-28 | Discharge: 2021-02-28 | Disposition: A | Discharge status: Home / Self Care | Payer: Medicare Other | Source: Ambulatory Visit | Attending: Radiation Oncology | Admitting: Radiation Oncology

## 2021-02-28 ENCOUNTER — Inpatient Hospital Stay: Payer: Medicare Other

## 2021-02-28 ENCOUNTER — Other Ambulatory Visit: Payer: Self-pay

## 2021-02-28 VITALS — BP 129/57 | HR 68 | Temp 98.4°F | Resp 17 | Wt 176.8 lb

## 2021-02-28 DIAGNOSIS — C3492 Malignant neoplasm of unspecified part of left bronchus or lung: Secondary | ICD-10-CM

## 2021-02-28 LAB — CMP (CANCER CENTER ONLY)
ALT: 9 U/L (ref 0–44)
AST: 13 U/L — ABNORMAL LOW (ref 15–41)
Albumin: 2.6 g/dL — ABNORMAL LOW (ref 3.5–5.0)
Alkaline Phosphatase: 55 U/L (ref 38–126)
Anion gap: 5 (ref 5–15)
BUN: 20 mg/dL (ref 8–23)
CO2: 26 mmol/L (ref 22–32)
Calcium: 9.1 mg/dL (ref 8.9–10.3)
Chloride: 105 mmol/L (ref 98–111)
Creatinine: 0.89 mg/dL (ref 0.61–1.24)
GFR, Estimated: >60 mL/min (ref >60)
Glucose, Bld: 84 mg/dL (ref 70–99)
Potassium: 4.2 mmol/L (ref 3.5–5.1)
Sodium: 136 mmol/L (ref 135–145)
Total Bilirubin: <0.2 mg/dL — ABNORMAL LOW (ref 0.3–1.2)
Total Protein: 8.2 g/dL — ABNORMAL HIGH (ref 6.5–8.1)

## 2021-02-28 LAB — CBC WITH DIFFERENTIAL (CANCER CENTER ONLY)
Abs Immature Granulocytes: 0.04 10*3/uL (ref 0.00–0.07)
Basophils Absolute: 0 10*3/uL (ref 0.0–0.1)
Basophils Relative: 0 %
Eosinophils Absolute: 0 10*3/uL (ref 0.0–0.5)
Eosinophils Relative: 0 %
HCT: 28.4 % — ABNORMAL LOW (ref 39.0–52.0)
Hemoglobin: 9.4 g/dL — ABNORMAL LOW (ref 13.0–17.0)
Immature Granulocytes: 1 %
Lymphocytes Relative: 29 %
Lymphs Abs: 0.8 10*3/uL (ref 0.7–4.0)
MCH: 33.1 pg (ref 26.0–34.0)
MCHC: 33.1 g/dL (ref 30.0–36.0)
MCV: 100 fL (ref 80.0–100.0)
Monocytes Absolute: 0.1 10*3/uL (ref 0.1–1.0)
Monocytes Relative: 5 %
Neutro Abs: 1.8 10*3/uL (ref 1.7–7.7)
Neutrophils Relative %: 65 %
Platelet Count: 134 10*3/uL — ABNORMAL LOW (ref 150–400)
RBC: 2.84 MIL/uL — ABNORMAL LOW (ref 4.22–5.81)
RDW: 14 % (ref 11.5–15.5)
WBC Count: 2.8 10*3/uL — ABNORMAL LOW (ref 4.0–10.5)
nRBC: 0 % (ref 0.0–0.2)

## 2021-02-28 MED ORDER — DIPHENHYDRAMINE HCL 50 MG/ML IJ SOLN
INTRAMUSCULAR | Status: AC
  Filled 2021-02-28: qty 1

## 2021-02-28 MED ORDER — PALONOSETRON HCL INJECTION 0.25 MG/5ML
INTRAVENOUS | Status: AC
  Filled 2021-02-28: qty 5

## 2021-02-28 MED ORDER — SODIUM CHLORIDE 0.9 % IV SOLN
Freq: Once | INTRAVENOUS | Status: AC
  Filled 2021-02-28: qty 250

## 2021-02-28 MED ORDER — FAMOTIDINE 20 MG IN NS 100 ML IVPB
INTRAVENOUS | Status: AC
  Filled 2021-02-28: qty 100

## 2021-02-28 MED ORDER — PALONOSETRON HCL INJECTION 0.25 MG/5ML
0.2500 mg | Freq: Once | INTRAVENOUS | Status: AC
  Administered 2021-02-28: 0.25 mg via INTRAVENOUS

## 2021-02-28 MED ORDER — SODIUM CHLORIDE 0.9 % IV SOLN
45.0000 mg/m2 | Freq: Once | INTRAVENOUS | Status: AC
  Administered 2021-02-28: 90 mg via INTRAVENOUS
  Filled 2021-02-28: qty 15

## 2021-02-28 MED ORDER — DIPHENHYDRAMINE HCL 50 MG/ML IJ SOLN
50.0000 mg | Freq: Once | INTRAMUSCULAR | Status: AC
  Administered 2021-02-28: 50 mg via INTRAVENOUS

## 2021-02-28 MED ORDER — SODIUM CHLORIDE 0.9 % IV SOLN
20.0000 mg | Freq: Once | INTRAVENOUS | Status: AC
  Administered 2021-02-28: 20 mg via INTRAVENOUS
  Filled 2021-02-28: qty 20

## 2021-02-28 MED ORDER — FAMOTIDINE 20 MG IN NS 100 ML IVPB
20.0000 mg | Freq: Once | INTRAVENOUS | Status: AC
  Administered 2021-02-28: 20 mg via INTRAVENOUS

## 2021-02-28 MED ORDER — SODIUM CHLORIDE 0.9 % IV SOLN
174.0000 mg | Freq: Once | INTRAVENOUS | Status: AC
  Administered 2021-02-28: 170 mg via INTRAVENOUS
  Filled 2021-02-28: qty 17

## 2021-02-28 NOTE — Patient Instructions (Signed)
[facility]  Discharge Instructions: Thank you for choosing [facility] to provide your oncology and hematology care.   If you have a lab appointment with the [facility], please go directly to the [facility] and check in at the registration area.   Wear comfortable clothing and clothing appropriate for easy access to any Portacath or PICC line.   We strive to give you quality time with your provider. You may need to reschedule your appointment if you arrive late (15 or more minutes).  Arriving late affects you and other patients whose appointments are after yours.  Also, if you miss three or more appointments without notifying the office, you may be dismissed from the clinic at the provider's discretion.      For prescription refill requests, have your pharmacy contact our office and allow 72 hours for refills to be completed.    Today you received the following chemotherapy and/or immunotherapy agents: Taxol/Carbo.      To help prevent nausea and vomiting after your treatment, we encourage you to take your nausea medication as directed.  BELOW ARE SYMPTOMS THAT SHOULD BE REPORTED IMMEDIATELY: . *FEVER GREATER THAN 100.4 F (38 C) OR HIGHER . *CHILLS OR SWEATING . *NAUSEA AND VOMITING THAT IS NOT CONTROLLED WITH YOUR NAUSEA MEDICATION . *UNUSUAL SHORTNESS OF BREATH . *UNUSUAL BRUISING OR BLEEDING . *URINARY PROBLEMS (pain or burning when urinating, or frequent urination) . *BOWEL PROBLEMS (unusual diarrhea, constipation, pain near the anus) . TENDERNESS IN MOUTH AND THROAT WITH OR WITHOUT PRESENCE OF ULCERS (sore throat, sores in mouth, or a toothache) . UNUSUAL RASH, SWELLING OR PAIN  . UNUSUAL VAGINAL DISCHARGE OR ITCHING   Items with * indicate a potential emergency and should be followed up as soon as possible or go to the Emergency Department if any problems should occur.  Please show the CHEMOTHERAPY ALERT CARD or IMMUNOTHERAPY  ALERT CARD at check-in to the Emergency Department and triage nurse.  Should you have questions after your visit or need to cancel or reschedule your appointment, please contact [facility]  Dept: [phone]  and follow the prompts.  Office hours are 8:00 a.m. to 4:30 p.m. Monday - Friday. Please note that voicemails left after 4:00 p.m. may not be returned until the following business day.  We are closed weekends and major holidays. You have access to a nurse at all times for urgent questions. Please call the main number to the clinic Dept: [phone] and follow the prompts.   For any non-urgent questions, you may also contact your provider using MyChart. We now offer e-Visits for anyone 59 and older to request care online for non-urgent symptoms. For details visit [email or web address].   Also download the MyChart app! Go to the app store, search "MyChart", open the app, select [facility], and log in with your MyChart username and password.  Due to Covid, a mask is required upon entering the hospital/clinic. If you do not have a mask, one will be given to you upon arrival. For doctor visits, patients may have 1 support person aged 39 or older with them. For treatment visits, patients cannot have anyone with them due to current Covid guidelines and our immunocompromised population.

## 2021-03-01 ENCOUNTER — Ambulatory Visit
Admission: RE | Admit: 2021-03-01 | Discharge: 2021-03-01 | Disposition: A | Discharge status: Home / Self Care | Payer: Medicare Other | Source: Ambulatory Visit | Attending: Radiation Oncology | Admitting: Radiation Oncology

## 2021-03-01 DIAGNOSIS — C3492 Malignant neoplasm of unspecified part of left bronchus or lung: Secondary | ICD-10-CM | POA: Diagnosis not present

## 2021-03-02 ENCOUNTER — Ambulatory Visit: Payer: Medicare Other

## 2021-03-02 ENCOUNTER — Other Ambulatory Visit: Payer: Self-pay

## 2021-03-02 ENCOUNTER — Other Ambulatory Visit: Payer: Medicare Other

## 2021-03-02 ENCOUNTER — Ambulatory Visit
Admission: RE | Admit: 2021-03-02 | Discharge: 2021-03-02 | Disposition: A | Discharge status: Home / Self Care | Payer: Medicare Other | Source: Ambulatory Visit | Attending: Radiation Oncology | Admitting: Radiation Oncology

## 2021-03-02 DIAGNOSIS — C3492 Malignant neoplasm of unspecified part of left bronchus or lung: Secondary | ICD-10-CM | POA: Diagnosis not present

## 2021-03-03 ENCOUNTER — Ambulatory Visit
Admission: RE | Admit: 2021-03-03 | Discharge: 2021-03-03 | Disposition: A | Discharge status: Home / Self Care | Payer: Medicare Other | Source: Ambulatory Visit | Attending: Radiation Oncology | Admitting: Radiation Oncology

## 2021-03-03 DIAGNOSIS — C3492 Malignant neoplasm of unspecified part of left bronchus or lung: Secondary | ICD-10-CM | POA: Diagnosis not present

## 2021-03-04 ENCOUNTER — Encounter (HOSPITAL_COMMUNITY): Payer: Medicare Other

## 2021-03-04 ENCOUNTER — Ambulatory Visit
Admission: RE | Admit: 2021-03-04 | Discharge: 2021-03-04 | Disposition: A | Discharge status: Home / Self Care | Payer: Medicare Other | Source: Ambulatory Visit | Attending: Radiation Oncology | Admitting: Radiation Oncology

## 2021-03-04 DIAGNOSIS — C3492 Malignant neoplasm of unspecified part of left bronchus or lung: Secondary | ICD-10-CM | POA: Diagnosis not present

## 2021-03-04 MED FILL — Dexamethasone Sodium Phosphate Inj 100 MG/10ML: INTRAMUSCULAR | Qty: 2 | Status: AC

## 2021-03-08 ENCOUNTER — Inpatient Hospital Stay: Payer: Medicare Other

## 2021-03-08 ENCOUNTER — Telehealth: Payer: Self-pay | Admitting: Radiology

## 2021-03-08 ENCOUNTER — Other Ambulatory Visit: Payer: Self-pay | Admitting: Radiation Oncology

## 2021-03-08 ENCOUNTER — Inpatient Hospital Stay (HOSPITAL_BASED_OUTPATIENT_CLINIC_OR_DEPARTMENT_OTHER): Payer: Medicare Other | Admitting: Internal Medicine

## 2021-03-08 ENCOUNTER — Ambulatory Visit
Admission: RE | Admit: 2021-03-08 | Discharge: 2021-03-08 | Disposition: A | Discharge status: Home / Self Care | Payer: Medicare Other | Source: Ambulatory Visit | Attending: Radiation Oncology | Admitting: Radiation Oncology

## 2021-03-08 ENCOUNTER — Encounter: Payer: Self-pay | Admitting: Internal Medicine

## 2021-03-08 ENCOUNTER — Other Ambulatory Visit: Payer: Self-pay

## 2021-03-08 VITALS — BP 115/62 | HR 81 | Temp 97.8°F | Resp 16 | Ht 68.0 in | Wt 174.6 lb

## 2021-03-08 DIAGNOSIS — C3492 Malignant neoplasm of unspecified part of left bronchus or lung: Secondary | ICD-10-CM

## 2021-03-08 DIAGNOSIS — Z5111 Encounter for antineoplastic chemotherapy: Secondary | ICD-10-CM

## 2021-03-08 LAB — CMP (CANCER CENTER ONLY)
ALT: 9 U/L (ref 0–44)
AST: 14 U/L — ABNORMAL LOW (ref 15–41)
Albumin: 2.4 g/dL — ABNORMAL LOW (ref 3.5–5.0)
Alkaline Phosphatase: 55 U/L (ref 38–126)
Anion gap: 10 (ref 5–15)
BUN: 12 mg/dL (ref 8–23)
CO2: 24 mmol/L (ref 22–32)
Calcium: 8.5 mg/dL — ABNORMAL LOW (ref 8.9–10.3)
Chloride: 105 mmol/L (ref 98–111)
Creatinine: 0.84 mg/dL (ref 0.61–1.24)
GFR, Estimated: >60 mL/min (ref >60)
Glucose, Bld: 93 mg/dL (ref 70–99)
Potassium: 3.7 mmol/L (ref 3.5–5.1)
Sodium: 139 mmol/L (ref 135–145)
Total Bilirubin: 0.3 mg/dL (ref 0.3–1.2)
Total Protein: 7.5 g/dL (ref 6.5–8.1)

## 2021-03-08 LAB — CBC WITH DIFFERENTIAL (CANCER CENTER ONLY)
Abs Immature Granulocytes: 0.01 10*3/uL (ref 0.00–0.07)
Basophils Absolute: 0 10*3/uL (ref 0.0–0.1)
Basophils Relative: 1 %
Eosinophils Absolute: 0 10*3/uL (ref 0.0–0.5)
Eosinophils Relative: 1 %
HCT: 25.8 % — ABNORMAL LOW (ref 39.0–52.0)
Hemoglobin: 8.5 g/dL — ABNORMAL LOW (ref 13.0–17.0)
Immature Granulocytes: 1 %
Lymphocytes Relative: 38 %
Lymphs Abs: 0.6 10*3/uL — ABNORMAL LOW (ref 0.7–4.0)
MCH: 32.8 pg (ref 26.0–34.0)
MCHC: 32.9 g/dL (ref 30.0–36.0)
MCV: 99.6 fL (ref 80.0–100.0)
Monocytes Absolute: 0.1 10*3/uL (ref 0.1–1.0)
Monocytes Relative: 5 %
Neutro Abs: 0.8 10*3/uL — ABNORMAL LOW (ref 1.7–7.7)
Neutrophils Relative %: 54 %
Platelet Count: 76 10*3/uL — ABNORMAL LOW (ref 150–400)
RBC: 2.59 MIL/uL — ABNORMAL LOW (ref 4.22–5.81)
RDW: 14.1 % (ref 11.5–15.5)
WBC Count: 1.5 10*3/uL — ABNORMAL LOW (ref 4.0–10.5)
nRBC: 0 % (ref 0.0–0.2)

## 2021-03-08 MED ORDER — SUCRALFATE 1 G PO TABS: ORAL_TABLET | ORAL | 2 refills | Status: DC

## 2021-03-08 MED ORDER — LIDOCAINE VISCOUS HCL 2 % MT SOLN: OROMUCOSAL | 3 refills | Status: DC

## 2021-03-08 NOTE — Progress Notes (Signed)
    [facility] [phone]   [phone]  OFFICE PROGRESS NOTE  Penner, Pamela, MD [address]   [address]  DIAGNOSIS: Stage IIIB non-small cell lung cancer, morphology and immunophenotype is consistent with poorly differentiated squamous cell carcinoma.  He presented with a centrally obstructing left upper lobe mass with contralateral mediastinal lymphadenopathy.  There is also an indeterminate right paramidline soft tissue nodule in the L2 vertebrae which is indeterminate.  He was diagnosed in April 2022.  PDL1 Expression: 0%  PRIOR THERAPY: None  CURRENT THERAPY: Weekly concurrent chemoradiation with carboplatin for AUC of 2 and paclitaxel 45 mg/m2.  First dose expected on 01/31/21.   Status post 5 cycles.  INTERVAL HISTORY: Leon Lyons 88 y.o. adult returns to the clinic today for follow-up visit accompanied by his wife.  The patient is feeling fine today with no concerning complaints except for mild sore throat and odynophagia from the radiation therapy.  He denied having any current chest pain, shortness of breath, cough or hemoptysis.  He denied having any fever or chills.  He has no nausea, vomiting, diarrhea or constipation.  He denied having any headache or visual changes.  He is here today for evaluation before starting cycle #6 of his treatment.  He is celebrating his 88 birthday today.  MEDICAL HISTORY: Past Medical History:  Diagnosis Date  . Lung cancer (HCC)   . Tobacco use     ALLERGIES:  has No Known Allergies.  MEDICATIONS:  Current Outpatient Medications  Medication Sig Dispense Refill  . acetaminophen (TYLENOL) 325 MG tablet Take 650 mg by mouth every 6 (six) hours as needed for moderate pain or headache.    . Ascorbic Acid (VITAMIN C) 1000 MG tablet Take 1,000 mg by mouth once a week.    . cholecalciferol (VITAMIN D3) 25 MCG (1000 UNIT) tablet Take 1,000 Units by mouth once a week.    .  Dextromethorphan-guaiFENesin 5-100 MG/5ML LIQD Take by mouth.    . guaiFENesin (MUCINEX) 600 MG 12 hr tablet Take 600 mg by mouth 1 day or 1 dose.    . Magnesium 400 MG CAPS Take 400 mg by mouth once a week.    . Multiple Vitamin (MULTIVITAMIN WITH MINERALS) TABS tablet Take 1 tablet by mouth once a week.    . Multiple Vitamins-Minerals (AIRBORNE PO) Take 1 tablet by mouth once a week.    . naproxen sodium (ALEVE) 220 MG tablet Take 220 mg by mouth daily as needed (pain).    . Omega-3 Fatty Acids (FISH OIL ULTRA) 1400 MG CAPS Take 1,400 mg by mouth once a week.    . polyethylene glycol (MIRALAX / GLYCOLAX) 17 g packet Take 17 g by mouth daily. 14 each 0  . prochlorperazine (COMPAZINE) 10 MG tablet Take 1 tablet (10 mg total) by mouth every 6 (six) hours as needed for nausea or vomiting. 30 tablet 0  . vitamin E 180 MG (400 UNITS) capsule Take 400 Units by mouth once a week.     No current facility-administered medications for this visit.    SURGICAL HISTORY:  Past Surgical History:  Procedure Laterality Date  . APPENDECTOMY    . BRONCHIAL BIOPSY  01/07/2021   Procedure: BRONCHIAL BIOPSIES;  Surgeon: Icard, Bradley L, DO;  Location: [facility];  Service: Pulmonary;;  . BRONCHIAL BRUSHINGS  01/07/2021   Procedure: BRONCHIAL BRUSHINGS;  Surgeon: Icard, Bradley L, DO;  Location: [facility];  Service: Pulmonary;;  .   BRONCHIAL NEEDLE ASPIRATION BIOPSY  01/07/2021   Procedure: BRONCHIAL NEEDLE ASPIRATION BIOPSIES;  Surgeon: Icard, Bradley L, DO;  Location: [facility];  Service: Pulmonary;;  . BRONCHIAL WASHINGS  01/07/2021   Procedure: BRONCHIAL WASHINGS;  Surgeon: Icard, Bradley L, DO;  Location: [facility];  Service: Pulmonary;;  . IRRIGATION AND DEBRIDEMENT SEBACEOUS CYST    . VIDEO BRONCHOSCOPY WITH ENDOBRONCHIAL ULTRASOUND Bilateral 01/07/2021   Procedure: VIDEO BRONCHOSCOPY WITH ENDOBRONCHIAL ULTRASOUND;  Surgeon: Icard, Bradley L, DO;  Location: [facility];  Service: Pulmonary;   Laterality: Bilateral;  possible need for radial ultrasound and fluoro     REVIEW OF SYSTEMS:  A comprehensive review of systems was negative except for: Constitutional: positive for fatigue Gastrointestinal: positive for odynophagia   PHYSICAL EXAMINATION: General appearance: alert, cooperative, fatigued and no distress Head: Normocephalic, without obvious abnormality, atraumatic Neck: no adenopathy, no JVD, supple, symmetrical, trachea midline and thyroid not enlarged, symmetric, no tenderness/mass/nodules Lymph nodes: Cervical, supraclavicular, and axillary nodes normal. Resp: clear to auscultation bilaterally Back: symmetric, no curvature. ROM normal. No CVA tenderness. Cardio: regular rate and rhythm, S1, S2 normal, no murmur, click, rub or gallop GI: soft, non-tender; bowel sounds normal; no masses,  no organomegaly Extremities: extremities normal, atraumatic, no cyanosis or edema  ECOG PERFORMANCE STATUS: 1 - Symptomatic but completely ambulatory  Blood pressure 115/62, pulse 81, temperature 97.8 F (36.6 C), temperature source Tympanic, resp. rate 16, height 5' 8" (1.727 m), weight 174 lb 9.6 oz (79.2 kg), SpO2 100 %.  LABORATORY DATA: Lab Results  Component Value Date   WBC 1.5 (L) 03/08/2021   HGB 8.5 (L) 03/08/2021   HCT 25.8 (L) 03/08/2021   MCV 99.6 03/08/2021   PLT 76 (L) 03/08/2021      Chemistry      Component Value Date/Time   NA 136 02/28/2021 0858   K 4.2 02/28/2021 0858   CL 105 02/28/2021 0858   CO2 26 02/28/2021 0858   BUN 20 02/28/2021 0858   CREATININE 0.89 02/28/2021 0858      Component Value Date/Time   CALCIUM 9.1 02/28/2021 0858   ALKPHOS 55 02/28/2021 0858   AST 13 (L) 02/28/2021 0858   ALT 9 02/28/2021 0858   BILITOT <0.2 (L) 02/28/2021 0858       RADIOGRAPHIC STUDIES: No results found.  ASSESSMENT AND PLAN: This is a very pleasant 88 years old white male recently diagnosed with stage IIIb non-small cell lung cancer, poorly  differentiated squamous cell carcinoma diagnosed in April 2022 with negative PD-L1 expression. The patient is currently undergoing a course of concurrent chemoradiation with weekly carboplatin for AUC of 2 and paclitaxel 45 Mg/M2 status post 5 cycles.   The patient has been tolerating this treatment well with no concerning adverse effect except for mild sore throat and odynophagia. He was supposed to start cycle #6 today but his total white blood count as well as absolute neutrophil count are below the parameter for the treatment. I will skip his treatment today and the patient will come back for cycle #6 next week. I will see him back for follow-up visit in 2 weeks for evaluation. He will continue his concurrent radiotherapy as planned and he will discuss with radiation oncology adjustment of the p.m. as well as treatment for the radiotherapy induced odynophagia. He was advised to call immediately if he has any other concerning symptoms in the interval. The patient voices understanding of current disease status and treatment options and is in agreement with the current care   plan.  All questions were answered. The patient knows to call the clinic with any problems, questions or concerns. We can certainly see the patient much sooner if necessary.    Disclaimer: This note was dictated with voice recognition software. Similar sounding words can inadvertently be transcribed and may not be corrected upon review.

## 2021-03-08 NOTE — Telephone Encounter (Signed)
Patient is present for radiation treatment and states he is having pain when swallowing. Can Carafate be prescribed?

## 2021-03-09 ENCOUNTER — Other Ambulatory Visit: Payer: Medicare Other

## 2021-03-09 ENCOUNTER — Ambulatory Visit: Payer: Medicare Other

## 2021-03-09 ENCOUNTER — Ambulatory Visit: Payer: Medicare Other | Admitting: Physician Assistant

## 2021-03-09 ENCOUNTER — Ambulatory Visit
Admission: RE | Admit: 2021-03-09 | Discharge: 2021-03-09 | Disposition: A | Discharge status: Home / Self Care | Payer: Medicare Other | Source: Ambulatory Visit | Attending: Radiation Oncology | Admitting: Radiation Oncology

## 2021-03-09 DIAGNOSIS — C3492 Malignant neoplasm of unspecified part of left bronchus or lung: Secondary | ICD-10-CM | POA: Insufficient documentation

## 2021-03-09 DIAGNOSIS — R5383 Other fatigue: Secondary | ICD-10-CM | POA: Diagnosis not present

## 2021-03-09 DIAGNOSIS — C3412 Malignant neoplasm of upper lobe, left bronchus or lung: Secondary | ICD-10-CM | POA: Diagnosis present

## 2021-03-09 DIAGNOSIS — Z5111 Encounter for antineoplastic chemotherapy: Secondary | ICD-10-CM | POA: Diagnosis present

## 2021-03-09 DIAGNOSIS — Z79899 Other long term (current) drug therapy: Secondary | ICD-10-CM | POA: Diagnosis not present

## 2021-03-09 DIAGNOSIS — R1310 Dysphagia, unspecified: Secondary | ICD-10-CM | POA: Diagnosis not present

## 2021-03-10 ENCOUNTER — Ambulatory Visit
Admission: RE | Admit: 2021-03-10 | Discharge: 2021-03-10 | Disposition: A | Discharge status: Home / Self Care | Payer: Medicare Other | Source: Ambulatory Visit | Attending: Radiation Oncology | Admitting: Radiation Oncology

## 2021-03-10 ENCOUNTER — Other Ambulatory Visit: Payer: Self-pay

## 2021-03-10 DIAGNOSIS — Z5111 Encounter for antineoplastic chemotherapy: Secondary | ICD-10-CM | POA: Diagnosis not present

## 2021-03-11 ENCOUNTER — Ambulatory Visit
Admission: RE | Admit: 2021-03-11 | Discharge: 2021-03-11 | Disposition: A | Discharge status: Home / Self Care | Payer: Medicare Other | Source: Ambulatory Visit | Attending: Radiation Oncology | Admitting: Radiation Oncology

## 2021-03-11 ENCOUNTER — Other Ambulatory Visit: Payer: Self-pay | Admitting: Physician Assistant

## 2021-03-11 DIAGNOSIS — C3492 Malignant neoplasm of unspecified part of left bronchus or lung: Secondary | ICD-10-CM

## 2021-03-11 DIAGNOSIS — Z5111 Encounter for antineoplastic chemotherapy: Secondary | ICD-10-CM | POA: Diagnosis not present

## 2021-03-11 MED FILL — Dexamethasone Sodium Phosphate Inj 100 MG/10ML: INTRAMUSCULAR | Qty: 2 | Status: AC

## 2021-03-14 ENCOUNTER — Other Ambulatory Visit: Payer: Self-pay

## 2021-03-14 ENCOUNTER — Other Ambulatory Visit: Payer: Self-pay | Admitting: Physician Assistant

## 2021-03-14 ENCOUNTER — Inpatient Hospital Stay: Payer: Medicare Other

## 2021-03-14 ENCOUNTER — Ambulatory Visit
Admission: RE | Admit: 2021-03-14 | Discharge: 2021-03-14 | Disposition: A | Discharge status: Home / Self Care | Payer: Medicare Other | Source: Ambulatory Visit | Attending: Radiation Oncology | Admitting: Radiation Oncology

## 2021-03-14 ENCOUNTER — Telehealth: Payer: Self-pay | Admitting: Physician Assistant

## 2021-03-14 ENCOUNTER — Inpatient Hospital Stay: Payer: Medicare Other | Attending: Physician Assistant

## 2021-03-14 DIAGNOSIS — C3412 Malignant neoplasm of upper lobe, left bronchus or lung: Secondary | ICD-10-CM | POA: Insufficient documentation

## 2021-03-14 DIAGNOSIS — D709 Neutropenia, unspecified: Secondary | ICD-10-CM | POA: Insufficient documentation

## 2021-03-14 DIAGNOSIS — C3492 Malignant neoplasm of unspecified part of left bronchus or lung: Secondary | ICD-10-CM

## 2021-03-14 DIAGNOSIS — R1310 Dysphagia, unspecified: Secondary | ICD-10-CM | POA: Insufficient documentation

## 2021-03-14 DIAGNOSIS — Z5111 Encounter for antineoplastic chemotherapy: Secondary | ICD-10-CM | POA: Insufficient documentation

## 2021-03-14 DIAGNOSIS — R5383 Other fatigue: Secondary | ICD-10-CM | POA: Insufficient documentation

## 2021-03-14 DIAGNOSIS — Z79899 Other long term (current) drug therapy: Secondary | ICD-10-CM | POA: Insufficient documentation

## 2021-03-14 LAB — CMP (CANCER CENTER ONLY)
ALT: 8 U/L (ref 0–44)
AST: 12 U/L — ABNORMAL LOW (ref 15–41)
Albumin: 2.7 g/dL — ABNORMAL LOW (ref 3.5–5.0)
Alkaline Phosphatase: 54 U/L (ref 38–126)
Anion gap: 6 (ref 5–15)
BUN: 16 mg/dL (ref 8–23)
CO2: 25 mmol/L (ref 22–32)
Calcium: 8.8 mg/dL — ABNORMAL LOW (ref 8.9–10.3)
Chloride: 106 mmol/L (ref 98–111)
Creatinine: 0.95 mg/dL (ref 0.61–1.24)
GFR, Estimated: >60 mL/min (ref >60)
Glucose, Bld: 87 mg/dL (ref 70–99)
Potassium: 4.2 mmol/L (ref 3.5–5.1)
Sodium: 137 mmol/L (ref 135–145)
Total Bilirubin: 0.2 mg/dL — ABNORMAL LOW (ref 0.3–1.2)
Total Protein: 8 g/dL (ref 6.5–8.1)

## 2021-03-14 LAB — CBC WITH DIFFERENTIAL (CANCER CENTER ONLY)
Abs Immature Granulocytes: 0 10*3/uL (ref 0.00–0.07)
Basophils Absolute: 0 10*3/uL (ref 0.0–0.1)
Basophils Relative: 1 %
Eosinophils Absolute: 0 10*3/uL (ref 0.0–0.5)
Eosinophils Relative: 1 %
HCT: 27.2 % — ABNORMAL LOW (ref 39.0–52.0)
Hemoglobin: 8.8 g/dL — ABNORMAL LOW (ref 13.0–17.0)
Immature Granulocytes: 0 %
Lymphocytes Relative: 48 %
Lymphs Abs: 0.6 10*3/uL — ABNORMAL LOW (ref 0.7–4.0)
MCH: 32.8 pg (ref 26.0–34.0)
MCHC: 32.4 g/dL (ref 30.0–36.0)
MCV: 101.5 fL — ABNORMAL HIGH (ref 80.0–100.0)
Monocytes Absolute: 0.1 10*3/uL (ref 0.1–1.0)
Monocytes Relative: 7 %
Neutro Abs: 0.5 10*3/uL — ABNORMAL LOW (ref 1.7–7.7)
Neutrophils Relative %: 43 %
Platelet Count: 113 10*3/uL — ABNORMAL LOW (ref 150–400)
RBC: 2.68 MIL/uL — ABNORMAL LOW (ref 4.22–5.81)
RDW: 14.7 % (ref 11.5–15.5)
WBC Count: 1.2 10*3/uL — ABNORMAL LOW (ref 4.0–10.5)
nRBC: 0 % (ref 0.0–0.2)

## 2021-03-14 MED ORDER — TBO-FILGRASTIM 300 MCG/0.5ML ~~LOC~~ SOSY: 300.0000 ug | PREFILLED_SYRINGE | Freq: Once | SUBCUTANEOUS | Status: DC

## 2021-03-14 MED ORDER — FILGRASTIM-SNDZ 300 MCG/0.5ML IJ SOSY
PREFILLED_SYRINGE | INTRAMUSCULAR | Status: AC
  Filled 2021-03-14: qty 0.5

## 2021-03-14 MED ORDER — FILGRASTIM-SNDZ 300 MCG/0.5ML IJ SOSY
300.0000 ug | PREFILLED_SYRINGE | Freq: Once | INTRAMUSCULAR | Status: AC
  Administered 2021-03-14: 300 ug via SUBCUTANEOUS

## 2021-03-14 NOTE — Progress Notes (Signed)
No treatment today due to low ANC/WBC.  Granix given today & pt to return tomorrow for second dose.  Pt returns in 1 wk for labs/MD & possible treatment.

## 2021-03-14 NOTE — Telephone Encounter (Signed)
Scheduled appointment per 06/06 sch msg. Patient is aware.

## 2021-03-15 ENCOUNTER — Ambulatory Visit
Admission: RE | Admit: 2021-03-15 | Discharge: 2021-03-15 | Disposition: A | Discharge status: Home / Self Care | Payer: Medicare Other | Source: Ambulatory Visit | Attending: Radiation Oncology | Admitting: Radiation Oncology

## 2021-03-15 ENCOUNTER — Telehealth: Payer: Self-pay | Admitting: Internal Medicine

## 2021-03-15 ENCOUNTER — Inpatient Hospital Stay: Payer: Medicare Other

## 2021-03-15 VITALS — BP 118/58 | HR 64 | Temp 98.4°F | Resp 18

## 2021-03-15 DIAGNOSIS — Z5111 Encounter for antineoplastic chemotherapy: Secondary | ICD-10-CM | POA: Diagnosis not present

## 2021-03-15 DIAGNOSIS — D709 Neutropenia, unspecified: Secondary | ICD-10-CM

## 2021-03-15 MED ORDER — FILGRASTIM-SNDZ 300 MCG/0.5ML IJ SOSY
300.0000 ug | PREFILLED_SYRINGE | Freq: Once | INTRAMUSCULAR | Status: AC
  Administered 2021-03-15: 300 ug via SUBCUTANEOUS

## 2021-03-15 MED ORDER — FILGRASTIM-SNDZ 300 MCG/0.5ML IJ SOSY
PREFILLED_SYRINGE | INTRAMUSCULAR | Status: AC
  Filled 2021-03-15: qty 0.5

## 2021-03-15 NOTE — Patient Instructions (Signed)

## 2021-03-15 NOTE — Telephone Encounter (Signed)
Scheduled per los. Gave calendar

## 2021-03-16 ENCOUNTER — Ambulatory Visit
Admission: RE | Admit: 2021-03-16 | Discharge: 2021-03-16 | Disposition: A | Discharge status: Home / Self Care | Payer: Medicare Other | Source: Ambulatory Visit | Attending: Radiation Oncology | Admitting: Radiation Oncology

## 2021-03-16 ENCOUNTER — Other Ambulatory Visit: Payer: Self-pay

## 2021-03-16 DIAGNOSIS — Z5111 Encounter for antineoplastic chemotherapy: Secondary | ICD-10-CM | POA: Diagnosis not present

## 2021-03-17 ENCOUNTER — Ambulatory Visit
Admission: RE | Admit: 2021-03-17 | Discharge: 2021-03-17 | Disposition: A | Discharge status: Home / Self Care | Payer: Medicare Other | Source: Ambulatory Visit | Attending: Radiation Oncology | Admitting: Radiation Oncology

## 2021-03-17 DIAGNOSIS — Z5111 Encounter for antineoplastic chemotherapy: Secondary | ICD-10-CM | POA: Diagnosis not present

## 2021-03-18 ENCOUNTER — Other Ambulatory Visit: Payer: Self-pay

## 2021-03-18 ENCOUNTER — Ambulatory Visit
Admission: RE | Admit: 2021-03-18 | Discharge: 2021-03-18 | Disposition: A | Discharge status: Home / Self Care | Payer: Medicare Other | Source: Ambulatory Visit | Attending: Radiation Oncology | Admitting: Radiation Oncology

## 2021-03-18 DIAGNOSIS — C3492 Malignant neoplasm of unspecified part of left bronchus or lung: Secondary | ICD-10-CM

## 2021-03-18 DIAGNOSIS — Z5111 Encounter for antineoplastic chemotherapy: Secondary | ICD-10-CM | POA: Diagnosis not present

## 2021-03-18 MED FILL — Dexamethasone Sodium Phosphate Inj 100 MG/10ML: INTRAMUSCULAR | Qty: 2 | Status: AC

## 2021-03-21 ENCOUNTER — Inpatient Hospital Stay (HOSPITAL_BASED_OUTPATIENT_CLINIC_OR_DEPARTMENT_OTHER): Payer: Medicare Other | Admitting: Internal Medicine

## 2021-03-21 ENCOUNTER — Telehealth: Payer: Self-pay | Admitting: Internal Medicine

## 2021-03-21 ENCOUNTER — Other Ambulatory Visit: Payer: Self-pay

## 2021-03-21 ENCOUNTER — Inpatient Hospital Stay: Payer: Medicare Other

## 2021-03-21 ENCOUNTER — Ambulatory Visit
Admission: RE | Admit: 2021-03-21 | Discharge: 2021-03-21 | Disposition: A | Discharge status: Home / Self Care | Payer: Medicare Other | Source: Ambulatory Visit | Attending: Radiation Oncology | Admitting: Radiation Oncology

## 2021-03-21 VITALS — BP 115/61 | HR 79 | Temp 97.2°F | Resp 19 | Ht 68.0 in | Wt 168.8 lb

## 2021-03-21 DIAGNOSIS — C3492 Malignant neoplasm of unspecified part of left bronchus or lung: Secondary | ICD-10-CM | POA: Diagnosis not present

## 2021-03-21 DIAGNOSIS — Z5111 Encounter for antineoplastic chemotherapy: Secondary | ICD-10-CM | POA: Diagnosis not present

## 2021-03-21 DIAGNOSIS — C3490 Malignant neoplasm of unspecified part of unspecified bronchus or lung: Secondary | ICD-10-CM

## 2021-03-21 LAB — CMP (CANCER CENTER ONLY)
ALT: 8 U/L (ref 0–44)
AST: 14 U/L — ABNORMAL LOW (ref 15–41)
Albumin: 2.6 g/dL — ABNORMAL LOW (ref 3.5–5.0)
Alkaline Phosphatase: 60 U/L (ref 38–126)
Anion gap: 6 (ref 5–15)
BUN: 18 mg/dL (ref 8–23)
CO2: 27 mmol/L (ref 22–32)
Calcium: 8.9 mg/dL (ref 8.9–10.3)
Chloride: 105 mmol/L (ref 98–111)
Creatinine: 1 mg/dL (ref 0.61–1.24)
GFR, Estimated: >60 mL/min (ref >60)
Glucose, Bld: 94 mg/dL (ref 70–99)
Potassium: 4.4 mmol/L (ref 3.5–5.1)
Sodium: 138 mmol/L (ref 135–145)
Total Bilirubin: 0.3 mg/dL (ref 0.3–1.2)
Total Protein: 8 g/dL (ref 6.5–8.1)

## 2021-03-21 LAB — CBC WITH DIFFERENTIAL (CANCER CENTER ONLY)
Abs Immature Granulocytes: 0.09 10*3/uL — ABNORMAL HIGH (ref 0.00–0.07)
Basophils Absolute: 0 10*3/uL (ref 0.0–0.1)
Basophils Relative: 1 %
Eosinophils Absolute: 0 10*3/uL (ref 0.0–0.5)
Eosinophils Relative: 0 %
HCT: 26.3 % — ABNORMAL LOW (ref 39.0–52.0)
Hemoglobin: 8.8 g/dL — ABNORMAL LOW (ref 13.0–17.0)
Immature Granulocytes: 4 %
Lymphocytes Relative: 26 %
Lymphs Abs: 0.6 10*3/uL — ABNORMAL LOW (ref 0.7–4.0)
MCH: 33.6 pg (ref 26.0–34.0)
MCHC: 33.5 g/dL (ref 30.0–36.0)
MCV: 100.4 fL — ABNORMAL HIGH (ref 80.0–100.0)
Monocytes Absolute: 0.2 10*3/uL (ref 0.1–1.0)
Monocytes Relative: 7 %
Neutro Abs: 1.5 10*3/uL — ABNORMAL LOW (ref 1.7–7.7)
Neutrophils Relative %: 62 %
Platelet Count: 142 10*3/uL — ABNORMAL LOW (ref 150–400)
RBC: 2.62 MIL/uL — ABNORMAL LOW (ref 4.22–5.81)
RDW: 15.8 % — ABNORMAL HIGH (ref 11.5–15.5)
WBC Count: 2.4 10*3/uL — ABNORMAL LOW (ref 4.0–10.5)
nRBC: 0 % (ref 0.0–0.2)

## 2021-03-21 NOTE — Progress Notes (Signed)
Leon [phone]   [phone]  OFFICE PROGRESS NOTE  Leon Right, MD [place] [address]  DIAGNOSIS: Stage IIIB non-small cell lung cancer, morphology and immunophenotype is consistent with poorly differentiated squamous cell carcinoma.  He presented with a centrally obstructing left upper lobe mass with contralateral mediastinal lymphadenopathy.  There is also an indeterminate Lyons paramidline soft tissue nodule in the L2 vertebrae which is indeterminate.  He was diagnosed in April 2022.   PDL1 Expression: 0%   PRIOR THERAPY: None   CURRENT THERAPY: Weekly concurrent chemoradiation with carboplatin for AUC of 2 and paclitaxel 45 mg/m2.  First dose expected on 01/31/21.   Status post 5 cycles.  INTERVAL HISTORY: Leon Lyons 87 y.o. adult returns to the clinic today for follow-up visit accompanied by his wife.  The patient is feeling fine today with no concerning complaints.  He is tolerating the course of concurrent chemoradiation fairly well.  He is expected to complete the radiation therapy later this week.  He denied having any chest pain, shortness of breath, cough or hemoptysis.  He denied having any fever or chills.  He has no nausea, vomiting, diarrhea or constipation.  He denied having any headache or visual changes.  He is here today for evaluation before starting cycle #6 of his treatment tomorrow.  MEDICAL HISTORY: Past Medical History:  Diagnosis Date   Lung cancer ([place])    Tobacco use     ALLERGIES:  has No Known Allergies.  MEDICATIONS:  Current Outpatient Medications  Medication Sig Dispense Refill   acetaminophen (TYLENOL) 325 MG tablet Take 650 mg by mouth every 6 (six) hours as needed for moderate pain or headache.     Ascorbic Acid (VITAMIN C) 1000 MG tablet Take 1,000 mg by mouth once a week. (Patient not taking: Reported on 03/08/2021)     cholecalciferol (VITAMIN D3) 25 MCG  (1000 UNIT) tablet Take 1,000 Units by mouth once a week. (Patient not taking: Reported on 03/08/2021)     Dextromethorphan-guaiFENesin 5-100 MG/5ML LIQD Take by mouth.     lidocaine (XYLOCAINE) 2 % solution Mix 1 part 2% viscous lidocaine,1 part H2O. Swallow 60m of diluted mixture, 368m before meals, up to QID. Use PRN soreness 100 mL 3   Magnesium 400 MG CAPS Take 400 mg by mouth once a week.     Multiple Vitamin (MULTIVITAMIN WITH MINERALS) TABS tablet Take 1 tablet by mouth once a week.     Multiple Vitamins-Minerals (AIRBORNE PO) Take 1 tablet by mouth once a week.     Omega-3 Fatty Acids (FISH OIL ULTRA) 1400 MG CAPS Take 1,400 mg by mouth once a week. (Patient not taking: Reported on 03/08/2021)     polyethylene glycol (MIRALAX / GLYCOLAX) 17 g packet Take 17 g by mouth daily. (Patient not taking: Reported on 03/08/2021) 14 each 0   prochlorperazine (COMPAZINE) 10 MG tablet Take 1 tablet (10 mg total) by mouth every 6 (six) hours as needed for nausea or vomiting. (Patient not taking: Reported on 03/08/2021) 30 tablet 0   sucralfate (CARAFATE) 1 g tablet Dissolve 1 tablet in 1071m2O and swallow up to QID, PRN sore throat. 40 tablet 2   vitamin E 180 MG (400 UNITS) capsule Take 400 Units by mouth once a week. (Patient not taking: Reported on 03/08/2021)     No current facility-administered medications for this visit.    SURGICAL HISTORY:  Past  Surgical History:  Procedure Laterality Date   APPENDECTOMY     BRONCHIAL BIOPSY  01/07/2021   Procedure: BRONCHIAL BIOPSIES;  Surgeon: Leon Nash, DO;  Location: [facility];  Service: Pulmonary;;   BRONCHIAL BRUSHINGS  01/07/2021   Procedure: BRONCHIAL BRUSHINGS;  Surgeon: Leon Nash, DO;  Location: [facility];  Service: Pulmonary;;   BRONCHIAL NEEDLE ASPIRATION BIOPSY  01/07/2021   Procedure: BRONCHIAL NEEDLE ASPIRATION BIOPSIES;  Surgeon: Leon Nash, DO;  Location: [facility];  Service: Pulmonary;;   BRONCHIAL WASHINGS  01/07/2021    Procedure: BRONCHIAL WASHINGS;  Surgeon: Leon Nash, DO;  Location: [facility];  Service: Pulmonary;;   IRRIGATION AND DEBRIDEMENT SEBACEOUS CYST     VIDEO BRONCHOSCOPY WITH ENDOBRONCHIAL ULTRASOUND Bilateral 01/07/2021   Procedure: VIDEO BRONCHOSCOPY WITH ENDOBRONCHIAL ULTRASOUND;  Surgeon: Leon Nash, DO;  Location: [facility];  Service: Pulmonary;  Laterality: Bilateral;  possible need for radial ultrasound and fluoro     REVIEW OF SYSTEMS:  A comprehensive review of systems was negative except for: Constitutional: positive for fatigue   PHYSICAL EXAMINATION: General appearance: alert, cooperative, fatigued, and no distress Head: Normocephalic, without obvious abnormality, atraumatic Neck: no adenopathy, no JVD, supple, symmetrical, trachea midline, and thyroid not enlarged, symmetric, no tenderness/mass/nodules Lymph nodes: Cervical, supraclavicular, and axillary nodes normal. Resp: clear to auscultation bilaterally Back: symmetric, no curvature. ROM normal. No CVA tenderness. Cardio: regular rate and rhythm, S1, S2 normal, no murmur, click, rub or gallop GI: soft, non-tender; bowel sounds normal; no masses,  no organomegaly Extremities: extremities normal, atraumatic, no cyanosis or edema  ECOG PERFORMANCE STATUS: 1 - Symptomatic but completely ambulatory  Blood pressure 115/61, pulse 79, temperature (!) 97.2 F (36.2 C), temperature source Tympanic, resp. rate 19, height _0  (1.727 m), weight 168 lb 12.8 oz (76.6 kg), SpO2 100 %.  LABORATORY DATA: Lab Results  Component Value Date   WBC 2.4 (L) 03/21/2021   HGB 8.8 (L) 03/21/2021   HCT 26.3 (L) 03/21/2021   MCV 100.4 (H) 03/21/2021   PLT 142 (L) 03/21/2021      Chemistry      Component Value Date/Time   NA 138 03/21/2021 1050   K 4.4 03/21/2021 1050   CL 105 03/21/2021 1050   CO2 27 03/21/2021 1050   BUN 18 03/21/2021 1050   CREATININE 1.00 03/21/2021 1050      Component Value Date/Time   CALCIUM  8.9 03/21/2021 1050   ALKPHOS 60 03/21/2021 1050   AST 14 (L) 03/21/2021 1050   ALT 8 03/21/2021 1050   BILITOT 0.3 03/21/2021 1050       RADIOGRAPHIC STUDIES: No results found.  ASSESSMENT AND PLAN: This is a very pleasant 87 years old white male recently diagnosed with stage IIIb non-small cell lung cancer, poorly differentiated squamous cell carcinoma diagnosed in April 2022 with negative PD-L1 expression. The patient is currently undergoing a course of concurrent chemoradiation with weekly carboplatin for AUC of 2 and paclitaxel 45 Mg/M2 status post 5 cycles.   The patient continues to tolerate this treatment well with no concerning adverse effect except for mild fatigue and odynophagia. I recommended for him to proceed with cycle #6 tomorrow as planned. I will see him back for follow-up visit in 1 months for evaluation with repeat CT scan of the chest for restaging of his disease. The patient was advised to call immediately if he has any concerning symptoms in the interval. The patient voices understanding of current disease status and treatment  options and is in agreement with the current care plan.  All questions were answered. The patient knows to call the clinic with any problems, questions or concerns. We can certainly see the patient much sooner if necessary.    Disclaimer: This note was dictated with voice recognition software. Similar sounding words can inadvertently be transcribed and may not be corrected upon review.

## 2021-03-21 NOTE — Telephone Encounter (Signed)
Scheduled appointment per 06/13 secure chat chat. Patient is aware.

## 2021-03-22 ENCOUNTER — Ambulatory Visit
Admission: RE | Admit: 2021-03-22 | Discharge: 2021-03-22 | Disposition: A | Discharge status: Home / Self Care | Payer: Medicare Other | Source: Ambulatory Visit | Attending: Radiation Oncology | Admitting: Radiation Oncology

## 2021-03-22 ENCOUNTER — Inpatient Hospital Stay: Payer: Medicare Other

## 2021-03-22 VITALS — BP 125/69 | HR 86 | Temp 98.9°F | Resp 19

## 2021-03-22 DIAGNOSIS — C3492 Malignant neoplasm of unspecified part of left bronchus or lung: Secondary | ICD-10-CM

## 2021-03-22 DIAGNOSIS — Z5111 Encounter for antineoplastic chemotherapy: Secondary | ICD-10-CM | POA: Diagnosis not present

## 2021-03-22 MED ORDER — PALONOSETRON HCL INJECTION 0.25 MG/5ML
0.2500 mg | Freq: Once | INTRAVENOUS | Status: AC
  Administered 2021-03-22: 0.25 mg via INTRAVENOUS

## 2021-03-22 MED ORDER — FAMOTIDINE 20 MG IN NS 100 ML IVPB
20.0000 mg | Freq: Once | INTRAVENOUS | Status: AC
  Administered 2021-03-22: 20 mg via INTRAVENOUS

## 2021-03-22 MED ORDER — SODIUM CHLORIDE 0.9 % IV SOLN
45.0000 mg/m2 | Freq: Once | INTRAVENOUS | Status: AC
  Administered 2021-03-22: 90 mg via INTRAVENOUS
  Filled 2021-03-22: qty 15

## 2021-03-22 MED ORDER — DIPHENHYDRAMINE HCL 50 MG/ML IJ SOLN
INTRAMUSCULAR | Status: AC
  Filled 2021-03-22: qty 1

## 2021-03-22 MED ORDER — DIPHENHYDRAMINE HCL 50 MG/ML IJ SOLN
50.0000 mg | Freq: Once | INTRAMUSCULAR | Status: AC
  Administered 2021-03-22: 50 mg via INTRAVENOUS

## 2021-03-22 MED ORDER — PALONOSETRON HCL INJECTION 0.25 MG/5ML
INTRAVENOUS | Status: AC
  Filled 2021-03-22: qty 5

## 2021-03-22 MED ORDER — SODIUM CHLORIDE 0.9 % IV SOLN
174.0000 mg | Freq: Once | INTRAVENOUS | Status: AC
  Administered 2021-03-22: 170 mg via INTRAVENOUS
  Filled 2021-03-22: qty 17

## 2021-03-22 MED ORDER — FAMOTIDINE 20 MG IN NS 100 ML IVPB
INTRAVENOUS | Status: AC
  Filled 2021-03-22: qty 100

## 2021-03-22 MED ORDER — DEXAMETHASONE SODIUM PHOSPHATE 100 MG/10ML IJ SOLN
20.0000 mg | Freq: Once | INTRAMUSCULAR | Status: AC
Start: 2021-03-22 — End: 2021-03-22
  Administered 2021-03-22: 20 mg via INTRAVENOUS
  Filled 2021-03-22: qty 20

## 2021-03-22 MED ORDER — SODIUM CHLORIDE 0.9 % IV SOLN
Freq: Once | INTRAVENOUS | Status: AC
  Filled 2021-03-22: qty 250

## 2021-03-22 NOTE — Patient Instructions (Signed)
[facility] MEDICAL ONCOLOGY  Discharge Instructions: Thank you for choosing [facility] to provide your oncology and hematology care.   If you have a lab appointment with the Cancer Center, please go directly to the Cancer Center and check in at the registration area.   Wear comfortable clothing and clothing appropriate for easy access to any Portacath or PICC line.   We strive to give you quality time with your provider. You may need to reschedule your appointment if you arrive late (15 or more minutes).  Arriving late affects you and other patients whose appointments are after yours.  Also, if you miss three or more appointments without notifying the office, you may be dismissed from the clinic at the provider's discretion.      For prescription refill requests, have your pharmacy contact our office and allow 72 hours for refills to be completed.    Today you received the following chemotherapy and/or immunotherapy agents : Paclitaxel,  Carboplatin.   To help prevent nausea and vomiting after your treatment, we encourage you to take your nausea medication as directed.  BELOW ARE SYMPTOMS THAT SHOULD BE REPORTED IMMEDIATELY: *FEVER GREATER THAN 100.4 F (38 C) OR HIGHER *CHILLS OR SWEATING *NAUSEA AND VOMITING THAT IS NOT CONTROLLED WITH YOUR NAUSEA MEDICATION *UNUSUAL SHORTNESS OF BREATH *UNUSUAL BRUISING OR BLEEDING *URINARY PROBLEMS (pain or burning when urinating, or frequent urination) *BOWEL PROBLEMS (unusual diarrhea, constipation, pain near the anus) TENDERNESS IN MOUTH AND THROAT WITH OR WITHOUT PRESENCE OF ULCERS (sore throat, sores in mouth, or a toothache) UNUSUAL RASH, SWELLING OR PAIN  UNUSUAL VAGINAL DISCHARGE OR ITCHING   Items with * indicate a potential emergency and should be followed up as soon as possible or go to the Emergency Department if any problems should occur.  Please show the CHEMOTHERAPY ALERT CARD or IMMUNOTHERAPY ALERT CARD  at check-in to the Emergency Department and triage nurse.  Should you have questions after your visit or need to cancel or reschedule your appointment, please contact [facility]ICAL [facility]  Dept: [phone]  and follow the prompts.  Office hours are 8:00 a.m. to 4:30 p.m. Monday - Friday. Please note that voicemails left after 4:00 p.m. may not be returned until the following business day.  We are closed weekends and major holidays. You have access to a nurse at all times for urgent questions. Please call the main number to the clinic Dept: [phone] and follow the prompts.   For any non-urgent questions, you may also contact your provider using MyChart. We now offer e-Visits for anyone 85 and older to request care online for non-urgent symptoms. For details visit [email or web address].   Also download the MyChart app! Go to the app store, search "MyChart", open the app, select [facility], and log in with your MyChart username and password.  Due to Covid, a mask is required upon entering the hospital/clinic. If you do not have a mask, one will be given to you upon arrival. For doctor visits, patients may have 1 support person aged 18 or older with them. For treatment visits, patients cannot have anyone with them due to current Covid guidelines and our immunocompromised population.   

## 2021-03-23 ENCOUNTER — Ambulatory Visit
Admission: RE | Admit: 2021-03-23 | Discharge: 2021-03-23 | Disposition: A | Discharge status: Home / Self Care | Payer: Medicare Other | Source: Ambulatory Visit | Attending: Radiation Oncology | Admitting: Radiation Oncology

## 2021-03-23 DIAGNOSIS — Z5111 Encounter for antineoplastic chemotherapy: Secondary | ICD-10-CM | POA: Diagnosis not present

## 2021-03-24 ENCOUNTER — Other Ambulatory Visit: Payer: Self-pay

## 2021-03-24 ENCOUNTER — Ambulatory Visit
Admission: RE | Admit: 2021-03-24 | Discharge: 2021-03-24 | Disposition: A | Discharge status: Home / Self Care | Payer: Medicare Other | Source: Ambulatory Visit | Attending: Radiation Oncology | Admitting: Radiation Oncology

## 2021-03-24 DIAGNOSIS — Z923 Personal history of irradiation: Secondary | ICD-10-CM

## 2021-03-24 DIAGNOSIS — Z5111 Encounter for antineoplastic chemotherapy: Secondary | ICD-10-CM | POA: Diagnosis not present

## 2021-03-24 HISTORY — DX: Personal history of irradiation: Z92.3

## 2021-03-25 ENCOUNTER — Telehealth: Payer: Self-pay | Admitting: Internal Medicine

## 2021-03-25 NOTE — Telephone Encounter (Signed)
Scheduled per los. Called and left msg. Mailed printout  °

## 2021-04-18 ENCOUNTER — Other Ambulatory Visit: Payer: Self-pay

## 2021-04-18 ENCOUNTER — Ambulatory Visit (HOSPITAL_COMMUNITY)
Admission: RE | Admit: 2021-04-18 | Discharge: 2021-04-18 | Disposition: A | Discharge status: Home / Self Care | Payer: Medicare Other | Source: Ambulatory Visit | Attending: Internal Medicine | Admitting: Internal Medicine

## 2021-04-18 DIAGNOSIS — C3490 Malignant neoplasm of unspecified part of unspecified bronchus or lung: Secondary | ICD-10-CM | POA: Diagnosis present

## 2021-04-18 MED ORDER — IOHEXOL 300 MG/ML  SOLN
75.0000 mL | Freq: Once | INTRAMUSCULAR | Status: AC | PRN
  Administered 2021-04-18: 75 mL via INTRAVENOUS

## 2021-04-19 ENCOUNTER — Inpatient Hospital Stay: Payer: Medicare Other | Attending: Internal Medicine | Admitting: Internal Medicine

## 2021-04-19 VITALS — BP 112/53 | HR 67 | Temp 98.9°F | Resp 18 | Ht 68.0 in | Wt 170.6 lb

## 2021-04-19 DIAGNOSIS — C3412 Malignant neoplasm of upper lobe, left bronchus or lung: Secondary | ICD-10-CM | POA: Diagnosis present

## 2021-04-19 DIAGNOSIS — R5382 Chronic fatigue, unspecified: Secondary | ICD-10-CM

## 2021-04-19 DIAGNOSIS — Z923 Personal history of irradiation: Secondary | ICD-10-CM | POA: Diagnosis not present

## 2021-04-19 DIAGNOSIS — C3492 Malignant neoplasm of unspecified part of left bronchus or lung: Secondary | ICD-10-CM | POA: Diagnosis not present

## 2021-04-19 DIAGNOSIS — Z5112 Encounter for antineoplastic immunotherapy: Secondary | ICD-10-CM

## 2021-04-19 NOTE — Progress Notes (Signed)
[place] [phone]   Fax:(336) [phone]  OFFICE PROGRESS NOTE  [name], MD [place] [address]  DIAGNOSIS: Stage IIIB non-small cell lung cancer, morphology and immunophenotype is consistent with poorly differentiated squamous cell carcinoma.  He presented with a centrally obstructing left upper lobe mass with contralateral mediastinal lymphadenopathy.  There is also an indeterminate right paramidline soft tissue nodule in the L2 vertebrae which is indeterminate.  He was diagnosed in April 2022.   PDL1 Expression: 0%   PRIOR THERAPY: Weekly concurrent chemoradiation with carboplatin for AUC of 2 and paclitaxel 45 mg/m2.  First dose expected on 01/31/21.   Status post 6 cycles.  Last dose was giving 03/22/2021.   CURRENT THERAPY: Consolidation immunotherapy with Imfinzi 1500 Mg IV every 4 weeks.  First dose April 26, 2021.  INTERVAL HISTORY: Leon Lyons 87 y.o. adult returns to the clinic today for follow-up visit accompanied by his wife.  The patient is feeling fine today with no concerning complaints except for mild dysphagia.  He denied having any current chest pain, shortness of breath, cough or hemoptysis.  He denied having any fever or chills.  He has no nausea, vomiting, diarrhea or constipation.  He has no recent weight loss or night sweats.  He has no headache or visual changes.  He tolerated the previous course of concurrent chemoradiation fairly well.  The patient had repeat CT scan of the chest performed recently and he is here for evaluation and discussion of his scan results as well as future treatment options.  MEDICAL HISTORY: Past Medical History:  Diagnosis Date   Lung cancer ([place])    Tobacco use     ALLERGIES:  has No Known Allergies.  MEDICATIONS:  Current Outpatient Medications  Medication Sig Dispense Refill   acetaminophen (TYLENOL) 325 MG tablet Take 650 mg by mouth every 6 (six) hours  as needed for moderate pain or headache.     Ascorbic Acid (VITAMIN C) 1000 MG tablet Take 1,000 mg by mouth once a week. (Patient not taking: No sig reported)     cholecalciferol (VITAMIN D3) 25 MCG (1000 UNIT) tablet Take 1,000 Units by mouth once a week. (Patient not taking: No sig reported)     Dextromethorphan-guaiFENesin 5-100 MG/5ML LIQD Take by mouth. (Patient not taking: Reported on 03/21/2021)     lidocaine (XYLOCAINE) 2 % solution Mix 1 part 2% viscous lidocaine,1 part H2O. Swallow 85m of diluted mixture, 315m before meals, up to QID. Use PRN soreness 100 mL 3   Magnesium 400 MG CAPS Take 400 mg by mouth once a week. (Patient not taking: Reported on 03/21/2021)     Multiple Vitamin (MULTIVITAMIN WITH MINERALS) TABS tablet Take 1 tablet by mouth once a week. (Patient not taking: Reported on 03/21/2021)     Omega-3 Fatty Acids (FISH OIL ULTRA) 1400 MG CAPS Take 1,400 mg by mouth once a week.     polyethylene glycol (MIRALAX / GLYCOLAX) 17 g packet Take 17 g by mouth daily. (Patient not taking: No sig reported) 14 each 0   prochlorperazine (COMPAZINE) 10 MG tablet Take 1 tablet (10 mg total) by mouth every 6 (six) hours as needed for nausea or vomiting. 30 tablet 0   sucralfate (CARAFATE) 1 g tablet Dissolve 1 tablet in 1080m2O and swallow up to QID, PRN sore throat. 40 tablet 2   vitamin E 180 MG (400 UNITS) capsule Take 400 Units by  mouth once a week. (Patient not taking: No sig reported)     No current facility-administered medications for this visit.    SURGICAL HISTORY:  Past Surgical History:  Procedure Laterality Date   APPENDECTOMY     BRONCHIAL BIOPSY  01/07/2021   Procedure: BRONCHIAL BIOPSIES;  Surgeon: Garner Nash, DO;  Location: [facility];  Service: Pulmonary;;   BRONCHIAL BRUSHINGS  01/07/2021   Procedure: BRONCHIAL BRUSHINGS;  Surgeon: Garner Nash, DO;  Location: [facility];  Service: Pulmonary;;   BRONCHIAL NEEDLE ASPIRATION BIOPSY  01/07/2021   Procedure:  BRONCHIAL NEEDLE ASPIRATION BIOPSIES;  Surgeon: Garner Nash, DO;  Location: [facility];  Service: Pulmonary;;   BRONCHIAL WASHINGS  01/07/2021   Procedure: BRONCHIAL WASHINGS;  Surgeon: Garner Nash, DO;  Location: [facility];  Service: Pulmonary;;   IRRIGATION AND DEBRIDEMENT SEBACEOUS CYST     VIDEO BRONCHOSCOPY WITH ENDOBRONCHIAL ULTRASOUND Bilateral 01/07/2021   Procedure: VIDEO BRONCHOSCOPY WITH ENDOBRONCHIAL ULTRASOUND;  Surgeon: Garner Nash, DO;  Location: [facility];  Service: Pulmonary;  Laterality: Bilateral;  possible need for radial ultrasound and fluoro     REVIEW OF SYSTEMS:  Constitutional: positive for fatigue Eyes: negative Ears, nose, mouth, throat, and face: negative Respiratory: negative Cardiovascular: negative Gastrointestinal: positive for dysphagia Genitourinary:negative Integument/breast: negative Hematologic/lymphatic: negative Musculoskeletal:negative Neurological: negative Behavioral/Psych: negative Endocrine: negative Allergic/Immunologic: negative   PHYSICAL EXAMINATION: General appearance: alert, cooperative, fatigued, and no distress Head: Normocephalic, without obvious abnormality, atraumatic Neck: no adenopathy, no JVD, supple, symmetrical, trachea midline, and thyroid not enlarged, symmetric, no tenderness/mass/nodules Lymph nodes: Cervical, supraclavicular, and axillary nodes normal. Resp: clear to auscultation bilaterally Back: symmetric, no curvature. ROM normal. No CVA tenderness. Cardio: regular rate and rhythm, S1, S2 normal, no murmur, click, rub or gallop GI: soft, non-tender; bowel sounds normal; no masses,  no organomegaly Extremities: extremities normal, atraumatic, no cyanosis or edema Neurologic: Alert and oriented X 3, normal strength and tone. Normal symmetric reflexes. Normal coordination and gait  ECOG PERFORMANCE STATUS: 1 - Symptomatic but completely ambulatory  Blood pressure (!) 112/53, pulse 67, temperature  98.9 F (37.2 C), temperature source Oral, resp. rate 18, height _0  (1.727 m), weight 170 lb 9.6 oz (77.4 kg), SpO2 100 %.  LABORATORY DATA: Lab Results  Component Value Date   WBC 2.4 (L) 03/21/2021   HGB 8.8 (L) 03/21/2021   HCT 26.3 (L) 03/21/2021   MCV 100.4 (H) 03/21/2021   PLT 142 (L) 03/21/2021      Chemistry      Component Value Date/Time   NA 138 03/21/2021 1050   K 4.4 03/21/2021 1050   CL 105 03/21/2021 1050   CO2 27 03/21/2021 1050   BUN 18 03/21/2021 1050   CREATININE 1.00 03/21/2021 1050      Component Value Date/Time   CALCIUM 8.9 03/21/2021 1050   ALKPHOS 60 03/21/2021 1050   AST 14 (L) 03/21/2021 1050   ALT 8 03/21/2021 1050   BILITOT 0.3 03/21/2021 1050       RADIOGRAPHIC STUDIES: CT Chest W Contrast  Result Date: 04/19/2021 CLINICAL DATA:  Non-small-cell lung cancer, restaging. Chemotherapy and radiation therapy completed. EXAM: CT CHEST WITH CONTRAST TECHNIQUE: Multidetector CT imaging of the chest was performed during intravenous contrast administration. CONTRAST:  49m OMNIPAQUE IOHEXOL 300 MG/ML  SOL[facility] COMPAR[facility]:  [facility]/2021 is a also reviewed. FINDINGS: Cardiovascular: Aortic atherosclerosis. 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 Normal heart size, without pericardial effusion. Three vessel  coronary artery calcification. No central pulmonary embolism, on this non-dedicated study. Mediastinum/Nodes: No supraclavicular adenopathy. Right paratracheal node measures 7 mm on 47/2 versus 12 mm on the prior PET. 1.0 cm left paratracheal node on 48/2  (Previously 1.8 cm.) No hilar adenopathy. Lungs/Pleura: Trace left pleural fluid or thickening is unchanged. Mild centrilobular emphysema. Improvement in posterior left upper lobe lung mass, which is difficult to differentiate from surrounding volume loss and atelectasis. Measures on the order of 3.7 x 2.1 cm on 52/2 (Previously 4.0 x 2.4  cm.) Upper Abdomen: Small volume upper abdominal ascites, minimally increased. Normal imaged portions of the liver, spleen, stomach, pancreas. Bilateral adrenal thickening and nodularity, similar. Chronic left-sided hydronephrosis with stones up to 4 mm. Musculoskeletal: No acute osseous abnormality. IMPRESSION: 1. Response to therapy of central left upper lobe primary bronchogenic carcinoma and mediastinal adenopathy. 2. No new or progressive disease. 3. Aortic atherosclerosis (ICD10-I70.0), coronary artery atherosclerosis and emphysema (ICD10-J43.9). 4. Slight increase in upper abdominal ascites. 5. Chronic left-sided hydronephrosis and left nephrolithiasis. Electronically Signed   By: Abigail Miyamoto M.D.   On: 04/19/2021 09:01    ASSESSMENT AND PLAN: This is a very pleasant 87 years old white male recently diagnosed with stage IIIb non-small cell lung cancer, poorly differentiated squamous cell carcinoma diagnosed in April 2022 with negative PD-L1 expression. The patient a course of concurrent chemoradiation with weekly carboplatin for AUC of 2 and paclitaxel 45 Mg/M2 status post 6 cycles.  He tolerated his treatment well except for fatigue and mild dysphagia. He had repeat CT scan of the chest performed recently.  I personally and independently reviewed the scan images and discussed the results with the patient and his wife. His scan showed improvement of his disease. I discussed with the patient the option of observation versus proceeding with consolidation treatment with immunotherapy with Imfinzi 1500 Mg IV every 4 weeks for a total of 1 year unless the patient has unacceptable toxicity or disease progression. I discussed with the patient the adverse effect of this treatment including but not limited to immunotherapy mediated skin rash, diarrhea, inflammation of the lung, kidney, liver, thyroid or other endocrine dysfunction. The patient is interested in proceeding with the treatment and he is expected  to start the first cycle of this treatment next week. I will see him back for follow-up visit in 4 weeks for evaluation before starting cycle #2. He was advised to call immediately if he has any concerning symptoms in the interval. The patient voices understanding of current disease status and treatment options and is in agreement with the current care plan.  All questions were answered. The patient knows to call the clinic with any problems, questions or concerns. We can certainly see the patient much sooner if necessary.    Disclaimer: This note was dictated with voice recognition software. Similar sounding words can inadvertently be transcribed and may not be corrected upon review.

## 2021-04-19 NOTE — Progress Notes (Signed)
DISCONTINUE ON PATHWAY REGIMEN - Non-Small Cell Lung     Administer weekly:     Paclitaxel      Carboplatin   **Always confirm dose/schedule in your pharmacy ordering system**  REASON: Continuation Of Treatment PRIOR TREATMENT: [id number]: Carboplatin AUC=2 + Paclitaxel 45 mg/m2 Weekly During Radiation TREATMENT RESPONSE: Partial Response (PR)  START ON PATHWAY REGIMEN - Non-Small Cell Lung     A cycle is every 28 days:     Durvalumab   **Always confirm dose/schedule in your pharmacy ordering system**  Patient Characteristics: Preoperative or Nonsurgical Candidate (Clinical Staging), Stage III - Nonsurgical Candidate (Nonsquamous and Squamous), PS = 0, 1 Therapeutic Status: Preoperative or Nonsurgical Candidate (Clinical Staging) AJCC T Category: cT3 AJCC N Category: cN1 AJCC M Category: cM0 AJCC 8 Stage Grouping: IIIA ECOG Performance Status: 1 Intent of Therapy: Curative Intent, Discussed with Patient

## 2021-04-20 ENCOUNTER — Encounter: Payer: Self-pay | Admitting: Radiology

## 2021-04-21 NOTE — Progress Notes (Signed)
Radiation Oncology         [phone] ________________________________  Name: Leon Lyons MRN: [id number]  Date: 04/25/2021  DOB: 04/15/1933  Follow-Up Visit Note  CC: [name], MD  [name], MD    ICD-10-CM   1. Non-small cell carcinoma of left lung, stage 3 ([place])  C34.92     2. Lung mass  R91.8       Diagnosis: Stage IIIB non-small cell lung cancer, morphology and immunophenotype is consistent with poorly differentiated squamous cell carcinoma.  He presented with a centrally obstructing left upper lobe mass with contralateral mediastinal lymphadenopathy.  There is also an indeterminate right paramidline soft tissue nodule in the L2 vertebrae which is indeterminate.  He was diagnosed in April 2022.  Interval Since Last Radiation:  1 month and 2 days    Intent: Curative  Radiation Treatment Dates: 02/10/2021 through 03/24/2021 Site Technique Total Dose (Gy) Dose per Fx (Gy) Completed Fx Beam Energies  Lung, Left: Lung_Lt IMRT 60/60 2 30/30 6X   Narrative:  The patient returns today for routine follow-up.  The patient completed his radiation treatment on 03/24/21 which her tolerated well overall.    Recent imaging includes a chest CT taken on 04/18/21 which showed no new or progressive disease within the chest.  Tumor in the left lung and used on adenopathy had improved.  The patient most recently followed with Dr. Julien Nordmann on 04/19/21 to go over recent CT and for follow-up. The patient reported feeling well during this visit with his only complaint being of mild dysphagia. The patient agreed to start consolidation treatment with immunotherapy with Imfinzi 1500 Mg IV every 4 weeks for a total of 1 year in the coming week after this visit.  Patient reported significant fatigue after completion of his treatment.  This is improving but he is not back to his baseline.                              Allergies:  has No Known Allergies.  Meds: Current Outpatient  Medications  Medication Sig Dispense Refill   acetaminophen (TYLENOL) 325 MG tablet Take 650 mg by mouth every 6 (six) hours as needed for moderate pain or headache.     polyethylene glycol (MIRALAX / GLYCOLAX) 17 g packet Take 17 g by mouth daily. 14 each 0   Ascorbic Acid (VITAMIN C) 1000 MG tablet Take 1,000 mg by mouth once a week. (Patient not taking: No sig reported)     cholecalciferol (VITAMIN D3) 25 MCG (1000 UNIT) tablet Take 1,000 Units by mouth once a week. (Patient not taking: No sig reported)     Dextromethorphan-guaiFENesin 5-100 MG/5ML LIQD Take by mouth. (Patient not taking: No sig reported)     lidocaine (XYLOCAINE) 2 % solution Mix 1 part 2% viscous lidocaine,1 part H2O. Swallow 92mL of diluted mixture, 54min before meals, up to QID. Use PRN soreness (Patient not taking: Reported on 04/25/2021) 100 mL 3   Magnesium 400 MG CAPS Take 400 mg by mouth once a week. (Patient not taking: No sig reported)     Multiple Vitamin (MULTIVITAMIN WITH MINERALS) TABS tablet Take 1 tablet by mouth once a week. (Patient not taking: No sig reported)     Omega-3 Fatty Acids (FISH OIL ULTRA) 1400 MG CAPS Take 1,400 mg by mouth once a week. (Patient not taking: Reported on 04/25/2021)     prochlorperazine (COMPAZINE) 10 MG tablet Take 1  tablet (10 mg total) by mouth every 6 (six) hours as needed for nausea or vomiting. (Patient not taking: Reported on 04/25/2021) 30 tablet 0   sucralfate (CARAFATE) 1 g tablet Dissolve 1 tablet in 71mL H2O and swallow up to QID, PRN sore throat. (Patient not taking: Reported on 04/25/2021) 40 tablet 2   vitamin E 180 MG (400 UNITS) capsule Take 400 Units by mouth once a week. (Patient not taking: No sig reported)     No current facility-administered medications for this encounter.    Physical Findings: The patient is in no acute distress. Patient is alert and oriented.  weight is 168 lb 6.4 oz (76.4 kg). His temperature is 97.2 F (36.2 C) (abnormal). His blood pressure  is 114/49 (abnormal) and his pulse is 67. His respiration is 18 and oxygen saturation is 100%. .  No significant changes. Lungs are clear to auscultation bilaterally. Heart has regular rate and rhythm. No palpable cervical, supraclavicular, or axillary adenopathy. Abdomen soft, non-tender, normal bowel sounds.   Lab Findings: Lab Results  Component Value Date   WBC 2.4 (L) 03/21/2021   HGB 8.8 (L) 03/21/2021   HCT 26.3 (L) 03/21/2021   MCV 100.4 (H) 03/21/2021   PLT 142 (L) 03/21/2021    Radiographic Findings: CT Chest W Contrast  Result Date: 04/19/2021 CLINICAL DATA:  Non-small-cell lung cancer, restaging. Chemotherapy and radiation therapy completed. EXAM: CT CHEST WITH CONTRAST TECHNIQUE: Multidetector CT imaging of the chest was performed during intravenous contrast administration. CONTRAST:  58mL OMNIPAQUE IOHEXOL 300 MG/ML  SOLN COMPARISON:  12[facility]28/2020 [facility]. PET of 01/24/2021 is a also reviewed. FINDINGS: Cardiovascular: Aortic atherosclerosis. 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 Normal heart size, without pericardial effusion. Three vessel coronary artery calcification. No central pulmonary embolism, on this non-dedicated study. Mediastinum/Nodes: No supraclavicular adenopathy. Right paratracheal node measures 7 mm on 47/2 versus 12 mm on the prior PET. 1.0 cm left paratracheal node on 48/2  (Previously 1.8 cm.) No hilar adenopathy. Lungs/Pleura: Trace left pleural fluid or thickening is unchanged. Mild centrilobular emphysema. Improvement in posterior left upper lobe lung mass, which is difficult to differentiate from surrounding volume loss and atelectasis. Measures on the order of 3.7 x 2.1 cm on 52/2 (Previously 4.0 x 2.4 cm.) Upper Abdomen: Small volume upper abdominal ascites, minimally increased. Normal imaged portions of the liver, spleen, stomach, pancreas. Bilateral adrenal thickening and nodularity, similar. Chronic left-sided  hydronephrosis with stones up to 4 mm. Musculoskeletal: No acute osseous abnormality. IMPRESSION: 1. Response to therapy of central left upper lobe primary bronchogenic carcinoma and mediastinal adenopathy. 2. No new or progressive disease. 3. Aortic atherosclerosis (ICD10-I70.0), coronary artery atherosclerosis and emphysema (ICD10-J43.9). 4. Slight increase in upper abdominal ascites. 5. Chronic left-sided hydronephrosis and left nephrolithiasis. Electronically Signed   By: Abigail Miyamoto M.D.   On: 04/19/2021 09:01    Impression:  Stage IIIB non-small cell lung cancer, morphology and immunophenotype is consistent with poorly differentiated squamous cell carcinoma.  He presented with a centrally obstructing left upper lobe mass with contralateral mediastinal lymphadenopathy.  There is also an indeterminate right paramidline soft tissue nodule in the L2 vertebrae which is indeterminate.  He was diagnosed in April 2022.  The patient is recovering from the effects of radiation.  He reported significant fatigue after his treatments are complete.  He continues to be frustrated with his lack of energy.  His breathing is stable.  He does not have any significant esophageal symptoms at this time.  Plan: As needed follow-up in radiation oncology.  Patient has agreed to proceed with immunotherapy as recommended by Dr. Julien Nordmann.   _________________________  Blair Promise, PhD, MD   This document serves as a record of services personally performed by Gery Pray, MD. It was created on his behalf by Roney Mans, a trained medical scribe. The creation of this record is based on the scribe's personal observations and the provider's statements to them. This document has been checked and approved by the attending provider.

## 2021-04-22 ENCOUNTER — Telehealth: Payer: Self-pay | Admitting: Internal Medicine

## 2021-04-22 ENCOUNTER — Telehealth: Payer: Self-pay | Admitting: Medical Oncology

## 2021-04-22 NOTE — Telephone Encounter (Signed)
I told pt it is ok to have a routine dental cleaning. Last chemo 06/13, Immunotherapy is supposed to start next week.

## 2021-04-22 NOTE — Telephone Encounter (Signed)
Scheduled per los. Due to patient going out of town, he can not start at requested date of 7/19. Scheduled on the earliest date available for patient. Notified provider. Confirmed appts with patient and wife

## 2021-04-25 ENCOUNTER — Other Ambulatory Visit: Payer: Self-pay

## 2021-04-25 ENCOUNTER — Ambulatory Visit
Admission: RE | Admit: 2021-04-25 | Discharge: 2021-04-25 | Disposition: A | Discharge status: Home / Self Care | Payer: Medicare Other | Source: Ambulatory Visit | Attending: Radiation Oncology | Admitting: Radiation Oncology

## 2021-04-25 ENCOUNTER — Encounter: Payer: Self-pay | Admitting: Radiation Oncology

## 2021-04-25 VITALS — BP 114/49 | HR 67 | Temp 97.2°F | Resp 18 | Wt 168.4 lb

## 2021-04-25 DIAGNOSIS — Z79899 Other long term (current) drug therapy: Secondary | ICD-10-CM | POA: Insufficient documentation

## 2021-04-25 DIAGNOSIS — R1310 Dysphagia, unspecified: Secondary | ICD-10-CM | POA: Insufficient documentation

## 2021-04-25 DIAGNOSIS — N132 Hydronephrosis with renal and ureteral calculous obstruction: Secondary | ICD-10-CM | POA: Diagnosis not present

## 2021-04-25 DIAGNOSIS — C3492 Malignant neoplasm of unspecified part of left bronchus or lung: Secondary | ICD-10-CM | POA: Diagnosis present

## 2021-04-25 DIAGNOSIS — R5383 Other fatigue: Secondary | ICD-10-CM | POA: Insufficient documentation

## 2021-04-25 DIAGNOSIS — I2510 Atherosclerotic heart disease of native coronary artery without angina pectoris: Secondary | ICD-10-CM | POA: Insufficient documentation

## 2021-04-25 DIAGNOSIS — Z923 Personal history of irradiation: Secondary | ICD-10-CM | POA: Diagnosis not present

## 2021-04-25 DIAGNOSIS — R918 Other nonspecific abnormal finding of lung field: Secondary | ICD-10-CM

## 2021-04-25 DIAGNOSIS — I700 Atherosclerosis of aorta: Secondary | ICD-10-CM | POA: Insufficient documentation

## 2021-04-25 DIAGNOSIS — J432 Centrilobular emphysema: Secondary | ICD-10-CM | POA: Insufficient documentation

## 2021-04-25 NOTE — Progress Notes (Signed)
Leon Lyons is here today for follow up post radiation to the lung.  Lung Side: left, patient completed radiation therapy on 03/24/21  Does the patient complain of any of the following: Pain:Patient denies pain.  Shortness of breath w/wo exertion: no Cough: occasional dry cough.  Hemoptysis: no Pain with swallowing: continues to have pain with swallowing, patient reports swallowing is getting better.  Swallowing/choking concerns: no Appetite: [employer] Level: Continues to have mild fatigue.  Post radiation skin Changes: hyperpigmented to left back    Additional comments if applicable:  Vitals:   03/40/35 1112  BP: (!) 114/49  Pulse: 67  Resp: 18  Temp: (!) 97.2 F (36.2 C)  SpO2: 100%  Weight: 168 lb 6.4 oz (76.4 kg)

## 2021-05-06 NOTE — Progress Notes (Signed)
[facility] OFFICE PROGRESS NOTE  [name], MD [address] [place] [address]  DIAGNOSIS: Stage IIIB non-small cell lung cancer, morphology and immunophenotype is consistent with poorly differentiated squamous cell carcinoma.  He presented with a centrally obstructing left upper lobe mass with contralateral mediastinal lymphadenopathy.  There is also an indeterminate right paramidline soft tissue nodule in the L2 vertebrae which is indeterminate.  He was diagnosed in April 2022.  PDL1 Expression: 0%  PRIOR THERAPY: Weekly concurrent chemoradiation with carboplatin for AUC of 2 and paclitaxel 45 mg/m2.  First dose expected on 01/31/21.   Status post 6 cycles.  Last dose was giving 03/22/2021.  CURRENT THERAPY:  Consolidation immunotherapy with Imfinzi 1500 Mg IV every 4 weeks.  First dose 05/11/21.  INTERVAL HISTORY: Leon Lyons 87 y.o. adult returns to the clinic today for a follow-up visit.  He was last seen on 04/19/2021 by Dr. Julien Nordmann.  At that appointment, the patient had completed his concurrent chemoradiation and had a routine follow-up scan.  He scan did not show any evidence of disease progression; therefore, Dr. Julien Nordmann recommended the patient start consolidation immunotherapy.  The expected first dose was supposed to be 04/26/2021.  The patient did not get treatment that day secondary to him having some personal chores/affairs to take care of.   Overall, the patient is feeling well today. He is continuing to recover from his last treatment.  He had some mild dysphagia from concurrent chemoradiation which is improving.  He continues to have anemia and leukocytopenia but not significant enough to warrant any GCSF injections or blood transfusions. He denies any fever, chills, or night sweats. He had some weight loss with his last course of treatment. His weight is fairly stable today. He denies any nausea, vomiting, diarrhea, or constipation.  He denies  any headache or visual changes.  He denies any rashes or skin changes.  He denies any chest pain, shortness of breath, cough, or hemoptysis.  He is here today for evaluation and repeat blood work before starting his first cycle of treatment.  MEDICAL HISTORY: Past Medical History:  Diagnosis Date   History of radiation therapy 03/24/2021   left lung   02/11/2021-03/24/2021  Dr Gery Pray   Lung cancer [facility])    Tobacco use     ALLERGIES:  has No Known Allergies.  MEDICATIONS:  Current Outpatient Medications  Medication Sig Dispense Refill   acetaminophen (TYLENOL) 325 MG tablet Take 650 mg by mouth every 6 (six) hours as needed for moderate pain or headache.     Ascorbic Acid (VITAMIN C) 1000 MG tablet Take 1,000 mg by mouth once a week. (Patient not taking: No sig reported)     cholecalciferol (VITAMIN D3) 25 MCG (1000 UNIT) tablet Take 1,000 Units by mouth once a week. (Patient not taking: No sig reported)     Dextromethorphan-guaiFENesin 5-100 MG/5ML LIQD Take by mouth. (Patient not taking: No sig reported)     lidocaine (XYLOCAINE) 2 % solution Mix 1 part 2% viscous lidocaine,1 part H2O. Swallow 81mL of diluted mixture, 28min before meals, up to QID. Use PRN soreness (Patient not taking: Reported on 04/25/2021) 100 mL 3   Magnesium 400 MG CAPS Take 400 mg by mouth once a week. (Patient not taking: No sig reported)     Multiple Vitamin (MULTIVITAMIN WITH MINERALS) TABS tablet Take 1 tablet by mouth once a week. (Patient not taking: No sig reported)     Omega-3 Fatty  Acids (FISH OIL ULTRA) 1400 MG CAPS Take 1,400 mg by mouth once a week. (Patient not taking: Reported on 04/25/2021)     polyethylene glycol (MIRALAX / GLYCOLAX) 17 g packet Take 17 g by mouth daily. 14 each 0   prochlorperazine (COMPAZINE) 10 MG tablet Take 1 tablet (10 mg total) by mouth every 6 (six) hours as needed for nausea or vomiting. (Patient not taking: Reported on 04/25/2021) 30 tablet 0   sucralfate (CARAFATE) 1 g  tablet Dissolve 1 tablet in 11mL H2O and swallow up to QID, PRN sore throat. (Patient not taking: Reported on 04/25/2021) 40 tablet 2   vitamin E 180 MG (400 UNITS) capsule Take 400 Units by mouth once a week. (Patient not taking: No sig reported)     No current facility-administered medications for this visit.    SURGICAL HISTORY:  Past Surgical History:  Procedure Laterality Date   APPENDECTOMY     BRONCHIAL BIOPSY  01/07/2021   Procedure: BRONCHIAL BIOPSIES;  Surgeon: Garner Nash, DO;  Location: [facility];  Service: Pulmonary;;   BRONCHIAL BRUSHINGS  01/07/2021   Procedure: BRONCHIAL BRUSHINGS;  Surgeon: Garner Nash, DO;  Location: [facility];  Service: Pulmonary;;   BRONCHIAL NEEDLE ASPIRATION BIOPSY  01/07/2021   Procedure: BRONCHIAL NEEDLE ASPIRATION BIOPSIES;  Surgeon: Garner Nash, DO;  Location: [facility];  Service: Pulmonary;;   BRONCHIAL WASHINGS  01/07/2021   Procedure: BRONCHIAL WASHINGS;  Surgeon: Garner Nash, DO;  Location: [facility];  Service: Pulmonary;;   IRRIGATION AND DEBRIDEMENT SEBACEOUS CYST     VIDEO BRONCHOSCOPY WITH ENDOBRONCHIAL ULTRASOUND Bilateral 01/07/2021   Procedure: VIDEO BRONCHOSCOPY WITH ENDOBRONCHIAL ULTRASOUND;  Surgeon: Garner Nash, DO;  Location: [facility];  Service: Pulmonary;  Laterality: Bilateral;  possible need for radial ultrasound and fluoro     REVIEW OF SYSTEMS:   Review of Systems  Constitutional: Positive for decreased appetite and fatigue. Negative for a chills, fever and unexpected weight change.  HENT: Negative for mouth sores, nosebleeds, sore throat and trouble swallowing.   Eyes: Negative for eye problems and icterus.  Respiratory: Negative for cough, hemoptysis, shortness of breath and wheezing.   Cardiovascular: Negative for chest pain and leg swelling.  Gastrointestinal: Negative for abdominal pain, constipation, diarrhea, nausea and vomiting.  Genitourinary: Negative for bladder incontinence,  difficulty urinating, dysuria, frequency and hematuria.   Musculoskeletal: Negative for back pain, gait problem, neck pain and neck stiffness.  Skin: Negative for itching and rash.  Neurological: Negative for dizziness, extremity weakness, gait problem, headaches, light-headedness and seizures.  Hematological: Negative for adenopathy. Does not bruise/bleed easily.  Psychiatric/Behavioral: Negative for confusion, depression and sleep disturbance. The patient is not nervous/anxious.     PHYSICAL EXAMINATION:  Blood pressure (!) 112/49, pulse 68, temperature 97.7 F (36.5 C), temperature source Tympanic, resp. rate 19, height $RemoveBe'5\' 8"'dKyaMbRgr$  (1.727 m), weight 168 lb 9.6 oz (76.5 kg), SpO2 100 %.  ECOG PERFORMANCE STATUS: 1  Physical Exam  Constitutional: Oriented to person, place, and time and well-developed, well-nourished, and in no distress. HENT: Head: Normocephalic and atraumatic. Mouth/Throat: Oropharynx is clear and moist. No oropharyngeal exudate. Eyes: Conjunctivae are normal. Right eye exhibits no discharge. Left eye exhibits no discharge. No scleral icterus. Neck: Normal range of motion. Neck supple. Cardiovascular: Normal rate, regular rhythm, normal heart sounds and intact distal pulses.   Pulmonary/Chest: Effort normal and breath sounds normal. No respiratory distress. No wheezes. No rales. Abdominal: Soft. Bowel sounds are normal. Exhibits no distension and no  mass. There is no tenderness.  Musculoskeletal: Normal range of motion. Exhibits no edema.  Lymphadenopathy:    No cervical adenopathy.  Neurological: Alert and oriented to person, place, and time. Exhibits normal muscle tone. Gait normal. Coordination normal. Skin: Skin is warm and dry. No rash noted. Not diaphoretic. No erythema. No pallor.  Psychiatric: Mood, memory and judgment normal. Vitals reviewed.  LABORATORY DATA: Lab Results  Component Value Date   WBC 2.7 (L) 05/11/2021   HGB 8.6 (L) 05/11/2021   HCT 26.7 (L)  05/11/2021   MCV 109.4 (H) 05/11/2021   PLT 222 05/11/2021      Chemistry      Component Value Date/Time   NA 138 03/21/2021 1050   K 4.4 03/21/2021 1050   CL 105 03/21/2021 1050   CO2 27 03/21/2021 1050   BUN 18 03/21/2021 1050   CREATININE 1.00 03/21/2021 1050      Component Value Date/Time   CALCIUM 8.9 03/21/2021 1050   ALKPHOS 60 03/21/2021 1050   AST 14 (L) 03/21/2021 1050   ALT 8 03/21/2021 1050   BILITOT 0.3 03/21/2021 1050       RADIOGRAPHIC STUDIES:  CT Chest W Contrast  Result Date: 04/19/2021 CLINICAL DATA:  Non-small-cell lung cancer, restaging. Chemotherapy and radiation therapy completed. EXAM: CT CHEST WITH CONTRAST TECHNIQUE: Multidetector CT imaging of the chest was performed during intravenous contrast administration. CONTRAST:  57mL OMNIPAQUE IOHEXOL 300 MG/ML  SOLN COMPARISON:  12/28/2020 from [facility]. PET of 01/24/2021 is a also reviewed. FINDINGS: Cardiovascular: Aortic atherosclerosis. 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 Normal heart size, without pericardial effusion. Three vessel coronary artery calcification. No central pulmonary embolism, on this non-dedicated study. Mediastinum/Nodes: No supraclavicular adenopathy. Right paratracheal node measures 7 mm on 47/2 versus 12 mm on the prior PET. 1.0 cm left paratracheal node on 48/2  (Previously 1.8 cm.) No hilar adenopathy. Lungs/Pleura: Trace left pleural fluid or thickening is unchanged. Mild centrilobular emphysema. Improvement in posterior left upper lobe lung mass, which is difficult to differentiate from surrounding volume loss and atelectasis. Measures on the order of 3.7 x 2.1 cm on 52/2 (Previously 4.0 x 2.4 cm.) Upper Abdomen: Small volume upper abdominal ascites, minimally increased. Normal imaged portions of the liver, spleen, stomach, pancreas. Bilateral adrenal thickening and nodularity, similar. Chronic left-sided hydronephrosis with stones up to 4 mm.  Musculoskeletal: No acute osseous abnormality. IMPRESSION: 1. Response to therapy of central left upper lobe primary bronchogenic carcinoma and mediastinal adenopathy. 2. No new or progressive disease. 3. Aortic atherosclerosis (ICD10-I70.0), coronary artery atherosclerosis and emphysema (ICD10-J43.9). 4. Slight increase in upper abdominal ascites. 5. Chronic left-sided hydronephrosis and left nephrolithiasis. Electronically Signed   By: Abigail Miyamoto M.D.   On: 04/19/2021 09:01     ASSESSMENT/PLAN:  This is a very pleasant 87 year old male diagnosed with stage IIIB non-small cell lung cancer, most consistent with squamous cell carcinoma.  He presented with a centrally obstructing left upper lobe lung mass with contralateral mediastinal lymphadenopathy.  There is also an indeterminate midline soft tissue nodule posterior to the L2 vertebrae which is indeterminate.  He was diagnosed in April 2022.  His PD-L1 expression was 0%.  The patient completed course of concurrent chemoradiation with weekly carboplatin for an AUC of 2 and paclitaxel 45 mg per metered squared.  He is status post 6 cycles of treatment.  He tolerated this well without any concerning adverse side effects except for dysphagia.  The patient is currently undergoing consolidation immunotherapy  with Imfinzi 1500 mg IV every 4 weeks. Scheduled to start his first dose of treatment today.   Labs were reviewed.  Recommend that he proceed with cycle #1 today scheduled.  We will see him back for follow-up visit in 4 weeks for evaluation before starting cycle #2.  He continues to have anemia which is likely causing his fatigue. He is wondering what he can do for this. Encouraged him to take a multivitamin or iron supplement to see if it helps his anemia.   He notes today it was difficult to obtain IV access. I briefly discussed the pros/cons of a port-a-cath. He will think about it. Discussed it is a Scientist, research (physical sciences). If he decides he would  like one, I encouraged him to let me know and I would be happy to facilitate this process. Encouraged him to come to the clinic hydrated on the days he needs treatment/lab work to help.   The patient was advised to call immediately if he has any concerning symptoms in the interval. The patient voices understanding of current disease status and treatment options and is in agreement with the current care plan. All questions were answered. The patient knows to call the clinic with any problems, questions or concerns. We can certainly see the patient much sooner if necessary  No orders of the defined types were placed in this encounter.    The total time spent in the appointment was 20-29 minutes.   Nicholus Chandran L Asim Gersten, PA-C 05/11/21

## 2021-05-11 ENCOUNTER — Other Ambulatory Visit: Payer: Self-pay

## 2021-05-11 ENCOUNTER — Inpatient Hospital Stay: Payer: Medicare Other

## 2021-05-11 ENCOUNTER — Inpatient Hospital Stay: Payer: Medicare Other | Attending: Physician Assistant | Admitting: Physician Assistant

## 2021-05-11 VITALS — BP 112/49 | HR 68 | Temp 97.7°F | Resp 19 | Ht 68.0 in | Wt 168.6 lb

## 2021-05-11 DIAGNOSIS — Z5112 Encounter for antineoplastic immunotherapy: Secondary | ICD-10-CM

## 2021-05-11 DIAGNOSIS — C3492 Malignant neoplasm of unspecified part of left bronchus or lung: Secondary | ICD-10-CM | POA: Diagnosis not present

## 2021-05-11 DIAGNOSIS — Z9221 Personal history of antineoplastic chemotherapy: Secondary | ICD-10-CM | POA: Insufficient documentation

## 2021-05-11 DIAGNOSIS — R1310 Dysphagia, unspecified: Secondary | ICD-10-CM | POA: Insufficient documentation

## 2021-05-11 DIAGNOSIS — Z923 Personal history of irradiation: Secondary | ICD-10-CM | POA: Diagnosis not present

## 2021-05-11 DIAGNOSIS — Z79899 Other long term (current) drug therapy: Secondary | ICD-10-CM | POA: Diagnosis not present

## 2021-05-11 DIAGNOSIS — C3412 Malignant neoplasm of upper lobe, left bronchus or lung: Secondary | ICD-10-CM | POA: Diagnosis present

## 2021-05-11 DIAGNOSIS — R5382 Chronic fatigue, unspecified: Secondary | ICD-10-CM

## 2021-05-11 LAB — CBC WITH DIFFERENTIAL (CANCER CENTER ONLY)
Abs Immature Granulocytes: 0.01 10*3/uL (ref 0.00–0.07)
Basophils Absolute: 0 10*3/uL (ref 0.0–0.1)
Basophils Relative: 2 %
Eosinophils Absolute: 0 10*3/uL (ref 0.0–0.5)
Eosinophils Relative: 1 %
HCT: 26.7 % — ABNORMAL LOW (ref 39.0–52.0)
Hemoglobin: 8.6 g/dL — ABNORMAL LOW (ref 13.0–17.0)
Immature Granulocytes: 0 %
Lymphocytes Relative: 33 %
Lymphs Abs: 0.9 10*3/uL (ref 0.7–4.0)
MCH: 35.2 pg — ABNORMAL HIGH (ref 26.0–34.0)
MCHC: 32.2 g/dL (ref 30.0–36.0)
MCV: 109.4 fL — ABNORMAL HIGH (ref 80.0–100.0)
Monocytes Absolute: 0.2 10*3/uL (ref 0.1–1.0)
Monocytes Relative: 6 %
Neutro Abs: 1.6 10*3/uL — ABNORMAL LOW (ref 1.7–7.7)
Neutrophils Relative %: 58 %
Platelet Count: 222 10*3/uL (ref 150–400)
RBC: 2.44 MIL/uL — ABNORMAL LOW (ref 4.22–5.81)
RDW: 17.1 % — ABNORMAL HIGH (ref 11.5–15.5)
WBC Count: 2.7 10*3/uL — ABNORMAL LOW (ref 4.0–10.5)
nRBC: 0 % (ref 0.0–0.2)

## 2021-05-11 LAB — CMP (CANCER CENTER ONLY)
ALT: 13 U/L (ref 0–44)
AST: 14 U/L — ABNORMAL LOW (ref 15–41)
Albumin: 2.7 g/dL — ABNORMAL LOW (ref 3.5–5.0)
Alkaline Phosphatase: 60 U/L (ref 38–126)
Anion gap: 8 (ref 5–15)
BUN: 12 mg/dL (ref 8–23)
CO2: 25 mmol/L (ref 22–32)
Calcium: 8.7 mg/dL — ABNORMAL LOW (ref 8.9–10.3)
Chloride: 108 mmol/L (ref 98–111)
Creatinine: 1.07 mg/dL (ref 0.61–1.24)
GFR, Estimated: >60 mL/min (ref >60)
Glucose, Bld: 114 mg/dL — ABNORMAL HIGH (ref 70–99)
Potassium: 3.7 mmol/L (ref 3.5–5.1)
Sodium: 141 mmol/L (ref 135–145)
Total Bilirubin: 0.3 mg/dL (ref 0.3–1.2)
Total Protein: 7.9 g/dL (ref 6.5–8.1)

## 2021-05-11 MED ORDER — SODIUM CHLORIDE 0.9 % IV SOLN
Freq: Once | INTRAVENOUS | Status: AC
  Filled 2021-05-11: qty 250

## 2021-05-11 MED ORDER — SODIUM CHLORIDE 0.9 % IV SOLN
1500.0000 mg | Freq: Once | INTRAVENOUS | Status: AC
  Administered 2021-05-11: 1500 mg via INTRAVENOUS
  Filled 2021-05-11: qty 30

## 2021-05-11 NOTE — Patient Instructions (Signed)
[facility]   Discharge Instructions: Thank you for choosing [facility] to provide your oncology and hematology care.   If you have a lab appointment with the [facility], please go directly to the [facility] and check in at the registration area.   Wear comfortable clothing and clothing appropriate for easy access to any Portacath or PICC line.   We strive to give you quality time with your provider. You may need to reschedule your appointment if you arrive late (15 or more minutes).  Arriving late affects you and other patients whose appointments are after yours.  Also, if you miss three or more appointments without notifying the office, you may be dismissed from the clinic at the provider's discretion.      For prescription refill requests, have your pharmacy contact our office and allow 72 hours for refills to be completed.    Today you received the following chemotherapy and/or immunotherapy agents: durvalumab.      To help prevent nausea and vomiting after your treatment, we encourage you to take your nausea medication as directed.  BELOW ARE SYMPTOMS THAT SHOULD BE REPORTED IMMEDIATELY: *FEVER GREATER THAN 100.4 F (38 C) OR HIGHER *CHILLS OR SWEATING *NAUSEA AND VOMITING THAT IS NOT CONTROLLED WITH YOUR NAUSEA MEDICATION *UNUSUAL SHORTNESS OF BREATH *UNUSUAL BRUISING OR BLEEDING *URINARY PROBLEMS (pain or burning when urinating, or frequent urination) *BOWEL PROBLEMS (unusual diarrhea, constipation, pain near the anus) TENDERNESS IN MOUTH AND THROAT WITH OR WITHOUT PRESENCE OF ULCERS (sore throat, sores in mouth, or a toothache) UNUSUAL RASH, SWELLING OR PAIN  UNUSUAL VAGINAL DISCHARGE OR ITCHING   Items with * indicate a potential emergency and should be followed up as soon as possible or go to the Emergency Department if any problems should occur.  Please show the CHEMOTHERAPY ALERT CARD or IMMUNOTHERAPY ALERT CARD at check-in  to the Emergency Department and triage nurse.  Should you have questions after your visit or need to cancel or reschedule your appointment, please contact [facility]  Dept: [phone]  and follow the prompts.  Office hours are 8:00 a.m. to 4:30 p.m. Monday - Friday. Please note that voicemails left after 4:00 p.m. may not be returned until the following business day.  We are closed weekends and major holidays. You have access to a nurse at all times for urgent questions. Please call the main number to the clinic Dept: [phone] and follow the prompts.   For any non-urgent questions, you may also contact your provider using MyChart. We now offer e-Visits for anyone 74 and older to request care online for non-urgent symptoms. For details visit [email or web address].   Also download the MyChart app! Go to the app store, search "MyChart", open the app, select [facility], and log in with your MyChart username and password.  Due to Covid, a mask is required upon entering the hospital/clinic. If you do not have a mask, one will be given to you upon arrival. For doctor visits, patients may have 1 support person aged 52 or older with them. For treatment visits, patients cannot have anyone with them due to current Covid guidelines and our immunocompromised population.   Durvalumab injection What is this medication? DURVALUMAB (dur VAL ue mab) is a monoclonal antibody. It is used to treat lungcancer. This medicine may be used for other purposes; ask your health care provider orpharmacist if you have questions. COMMON BRAND NAME(S): IMFINZI What should I tell my  care team before I take this medication? They need to know if you have any of these conditions: autoimmune diseases like Crohn's disease, ulcerative colitis, or lupus have had or planning to have an allogeneic stem cell transplant (uses someone else's stem cells) history of organ transplant history of radiation  to the chest nervous system problems like myasthenia gravis or Guillain-Barre syndrome an unusual or allergic reaction to durvalumab, other medicines, foods, dyes, or preservatives pregnant or trying to get pregnant breast-feeding How should I use this medication? This medicine is for infusion into a vein. It is given by a health careprofessional in a hospital or clinic setting. A special MedGuide will be given to you before each treatment. Be sure to readthis information carefully each time. Talk to your pediatrician regarding the use of this medicine in children.Special care may be needed. Overdosage: If you think you have taken too much of this medicine contact apoison control center or emergency room at once. NOTE: This medicine is only for you. Do not share this medicine with others. What if I miss a dose? It is important not to miss your dose. Call your doctor or health careprofessional if you are unable to keep an appointment. What may interact with this medication? Interactions have not been studied. This list may not describe all possible interactions. Give your health care provider a list of all the medicines, herbs, non-prescription drugs, or dietary supplements you use. Also tell them if you smoke, drink alcohol, or use illegaldrugs. Some items may interact with your medicine. What should I watch for while using this medication? This drug may make you feel generally unwell. Continue your course of treatmenteven though you feel ill unless your doctor tells you to stop. You may need blood work done while you are taking this medicine. Do not become pregnant while taking this medicine or for 3 months after stopping it. Women should inform their doctor if they wish to become pregnant or think they might be pregnant. There is a potential for serious side effects to an unborn child. Talk to your health care professional or pharmacist for more information. Do not breast-feed an infant while  taking this medicine orfor 3 months after stopping it. What side effects may I notice from receiving this medication? Side effects that you should report to your doctor or health care professionalas soon as possible: allergic reactions like skin rash, itching or hives, swelling of the face, lips, or tongue black, tarry stools bloody or watery diarrhea breathing problems change in emotions or moods change in sex drive changes in vision chest pain or chest tightness chills confusion cough facial flushing fever headache signs and symptoms of high blood sugar such as dizziness; dry mouth; dry skin; fruity breath; nausea; stomach pain; increased hunger or thirst; increased urination signs and symptoms of liver injury like dark yellow or brown urine; general ill feeling or flu-like symptoms; light-colored stools; loss of appetite; nausea; right upper belly pain; unusually weak or tired; yellowing of the eyes or skin stomach pain trouble passing urine or change in the amount of urine weight gain or weight loss Side effects that usually do not require medical attention (report these toyour doctor or health care professional if they continue or are bothersome): bone pain constipation loss of appetite muscle pain nausea swelling of the ankles, feet, hands tiredness This list may not describe all possible side effects. Call your doctor for medical advice about side effects. You may report side effects to FDA at1-800-FDA-1088. Where  should I keep my medication? This drug is given in a hospital or clinic and will not be stored at home. NOTE: This sheet is a summary. It may not cover all possible information. If you have questions about this medicine, talk to your doctor, pharmacist, orhealth care provider.  2022 Elsevier/Gold Standard (2019-12-04 13:01:29)

## 2021-05-12 LAB — TSH: TSH: 2.018 u[IU]/mL (ref 0.320–4.118)

## 2021-06-09 ENCOUNTER — Ambulatory Visit (HOSPITAL_COMMUNITY)
Admission: RE | Admit: 2021-06-09 | Discharge: 2021-06-09 | Disposition: A | Discharge status: Home / Self Care | Payer: Medicare Other | Source: Ambulatory Visit | Attending: Internal Medicine | Admitting: Internal Medicine

## 2021-06-09 ENCOUNTER — Other Ambulatory Visit: Payer: Self-pay

## 2021-06-09 ENCOUNTER — Inpatient Hospital Stay: Payer: Medicare Other

## 2021-06-09 ENCOUNTER — Inpatient Hospital Stay: Payer: Medicare Other | Attending: Physician Assistant | Admitting: Internal Medicine

## 2021-06-09 VITALS — BP 115/54 | HR 65 | Temp 97.1°F | Resp 18 | Ht 68.0 in | Wt 174.7 lb

## 2021-06-09 DIAGNOSIS — Z9221 Personal history of antineoplastic chemotherapy: Secondary | ICD-10-CM | POA: Insufficient documentation

## 2021-06-09 DIAGNOSIS — C3492 Malignant neoplasm of unspecified part of left bronchus or lung: Secondary | ICD-10-CM | POA: Diagnosis present

## 2021-06-09 DIAGNOSIS — R5382 Chronic fatigue, unspecified: Secondary | ICD-10-CM

## 2021-06-09 DIAGNOSIS — C3412 Malignant neoplasm of upper lobe, left bronchus or lung: Secondary | ICD-10-CM | POA: Insufficient documentation

## 2021-06-09 DIAGNOSIS — Z5112 Encounter for antineoplastic immunotherapy: Secondary | ICD-10-CM

## 2021-06-09 DIAGNOSIS — Z923 Personal history of irradiation: Secondary | ICD-10-CM | POA: Insufficient documentation

## 2021-06-09 DIAGNOSIS — Z79899 Other long term (current) drug therapy: Secondary | ICD-10-CM | POA: Insufficient documentation

## 2021-06-09 LAB — CMP (CANCER CENTER ONLY)
ALT: 7 U/L (ref 0–44)
AST: 13 U/L — ABNORMAL LOW (ref 15–41)
Albumin: 2.5 g/dL — ABNORMAL LOW (ref 3.5–5.0)
Alkaline Phosphatase: 59 U/L (ref 38–126)
Anion gap: 9 (ref 5–15)
BUN: 14 mg/dL (ref 8–23)
CO2: 25 mmol/L (ref 22–32)
Calcium: 9 mg/dL (ref 8.9–10.3)
Chloride: 105 mmol/L (ref 98–111)
Creatinine: 0.95 mg/dL (ref 0.61–1.24)
GFR, Estimated: >60 mL/min (ref >60)
Glucose, Bld: 84 mg/dL (ref 70–99)
Potassium: 4.1 mmol/L (ref 3.5–5.1)
Sodium: 139 mmol/L (ref 135–145)
Total Bilirubin: 0.2 mg/dL — ABNORMAL LOW (ref 0.3–1.2)
Total Protein: 8.1 g/dL (ref 6.5–8.1)

## 2021-06-09 LAB — CBC WITH DIFFERENTIAL (CANCER CENTER ONLY)
Abs Immature Granulocytes: 0.02 10*3/uL (ref 0.00–0.07)
Basophils Absolute: 0 10*3/uL (ref 0.0–0.1)
Basophils Relative: 1 %
Eosinophils Absolute: 0.1 10*3/uL (ref 0.0–0.5)
Eosinophils Relative: 2 %
HCT: 26 % — ABNORMAL LOW (ref 39.0–52.0)
Hemoglobin: 8.5 g/dL — ABNORMAL LOW (ref 13.0–17.0)
Immature Granulocytes: 1 %
Lymphocytes Relative: 22 %
Lymphs Abs: 0.8 10*3/uL (ref 0.7–4.0)
MCH: 35.1 pg — ABNORMAL HIGH (ref 26.0–34.0)
MCHC: 32.7 g/dL (ref 30.0–36.0)
MCV: 107.4 fL — ABNORMAL HIGH (ref 80.0–100.0)
Monocytes Absolute: 0.3 10*3/uL (ref 0.1–1.0)
Monocytes Relative: 10 %
Neutro Abs: 2.2 10*3/uL (ref 1.7–7.7)
Neutrophils Relative %: 64 %
Platelet Count: 257 10*3/uL (ref 150–400)
RBC: 2.42 MIL/uL — ABNORMAL LOW (ref 4.22–5.81)
RDW: 14.1 % (ref 11.5–15.5)
WBC Count: 3.4 10*3/uL — ABNORMAL LOW (ref 4.0–10.5)
nRBC: 0 % (ref 0.0–0.2)

## 2021-06-09 LAB — TSH: TSH: 3.307 u[IU]/mL (ref 0.320–4.118)

## 2021-06-09 MED ORDER — SODIUM CHLORIDE 0.9 % IV SOLN
1500.0000 mg | Freq: Once | INTRAVENOUS | Status: AC
  Administered 2021-06-09: 1500 mg via INTRAVENOUS
  Filled 2021-06-09: qty 30

## 2021-06-09 MED ORDER — SODIUM CHLORIDE 0.9 % IV SOLN: Freq: Once | INTRAVENOUS | Status: AC

## 2021-06-09 NOTE — Patient Instructions (Signed)
[facility]   Discharge Instructions: Thank you for choosing [facility] to provide your oncology and hematology care.   If you have a lab appointment with the [facility], please go directly to the [facility] and check in at the registration area.   Wear comfortable clothing and clothing appropriate for easy access to any Portacath or PICC line.   We strive to give you quality time with your provider. You may need to reschedule your appointment if you arrive late (15 or more minutes).  Arriving late affects you and other patients whose appointments are after yours.  Also, if you miss three or more appointments without notifying the office, you may be dismissed from the clinic at the provider's discretion.      For prescription refill requests, have your pharmacy contact our office and allow 72 hours for refills to be completed.    Today you received the following chemotherapy and/or immunotherapy agents: durvalumab.      To help prevent nausea and vomiting after your treatment, we encourage you to take your nausea medication as directed.  BELOW ARE SYMPTOMS THAT SHOULD BE REPORTED IMMEDIATELY: *FEVER GREATER THAN 100.4 F (38 C) OR HIGHER *CHILLS OR SWEATING *NAUSEA AND VOMITING THAT IS NOT CONTROLLED WITH YOUR NAUSEA MEDICATION *UNUSUAL SHORTNESS OF BREATH *UNUSUAL BRUISING OR BLEEDING *URINARY PROBLEMS (pain or burning when urinating, or frequent urination) *BOWEL PROBLEMS (unusual diarrhea, constipation, pain near the anus) TENDERNESS IN MOUTH AND THROAT WITH OR WITHOUT PRESENCE OF ULCERS (sore throat, sores in mouth, or a toothache) UNUSUAL RASH, SWELLING OR PAIN  UNUSUAL VAGINAL DISCHARGE OR ITCHING   Items with * indicate a potential emergency and should be followed up as soon as possible or go to the Emergency Department if any problems should occur.  Please show the CHEMOTHERAPY ALERT CARD or IMMUNOTHERAPY ALERT CARD at check-in  to the Emergency Department and triage nurse.  Should you have questions after your visit or need to cancel or reschedule your appointment, please contact [facility]  Dept: [phone]  and follow the prompts.  Office hours are 8:00 a.m. to 4:30 p.m. Monday - Friday. Please note that voicemails left after 4:00 p.m. may not be returned until the following business day.  We are closed weekends and major holidays. You have access to a nurse at all times for urgent questions. Please call the main number to the clinic Dept: [phone] and follow the prompts.   For any non-urgent questions, you may also contact your provider using MyChart. We now offer e-Visits for anyone 68 and older to request care online for non-urgent symptoms. For details visit [email or web address].   Also download the MyChart app! Go to the app store, search "MyChart", open the app, select [facility], and log in with your MyChart username and password.  Due to Covid, a mask is required upon entering the hospital/clinic. If you do not have a mask, one will be given to you upon arrival. For doctor visits, patients may have 1 support person aged 34 or older with them. For treatment visits, patients cannot have anyone with them due to current Covid guidelines and our immunocompromised population.   Durvalumab injection What is this medication? DURVALUMAB (dur VAL ue mab) is a monoclonal antibody. It is used to treat lungcancer. This medicine may be used for other purposes; ask your health care provider orpharmacist if you have questions. COMMON BRAND NAME(S): IMFINZI What should I tell my  care team before I take this medication? They need to know if you have any of these conditions: autoimmune diseases like Crohn's disease, ulcerative colitis, or lupus have had or planning to have an allogeneic stem cell transplant (uses someone else's stem cells) history of organ transplant history of radiation  to the chest nervous system problems like myasthenia gravis or Guillain-Barre syndrome an unusual or allergic reaction to durvalumab, other medicines, foods, dyes, or preservatives pregnant or trying to get pregnant breast-feeding How should I use this medication? This medicine is for infusion into a vein. It is given by a health careprofessional in a hospital or clinic setting. A special MedGuide will be given to you before each treatment. Be sure to readthis information carefully each time. Talk to your pediatrician regarding the use of this medicine in children.Special care may be needed. Overdosage: If you think you have taken too much of this medicine contact apoison control center or emergency room at once. NOTE: This medicine is only for you. Do not share this medicine with others. What if I miss a dose? It is important not to miss your dose. Call your doctor or health careprofessional if you are unable to keep an appointment. What may interact with this medication? Interactions have not been studied. This list may not describe all possible interactions. Give your health care provider a list of all the medicines, herbs, non-prescription drugs, or dietary supplements you use. Also tell them if you smoke, drink alcohol, or use illegaldrugs. Some items may interact with your medicine. What should I watch for while using this medication? This drug may make you feel generally unwell. Continue your course of treatmenteven though you feel ill unless your doctor tells you to stop. You may need blood work done while you are taking this medicine. Do not become pregnant while taking this medicine or for 3 months after stopping it. Women should inform their doctor if they wish to become pregnant or think they might be pregnant. There is a potential for serious side effects to an unborn child. Talk to your health care professional or pharmacist for more information. Do not breast-feed an infant while  taking this medicine orfor 3 months after stopping it. What side effects may I notice from receiving this medication? Side effects that you should report to your doctor or health care professionalas soon as possible: allergic reactions like skin rash, itching or hives, swelling of the face, lips, or tongue black, tarry stools bloody or watery diarrhea breathing problems change in emotions or moods change in sex drive changes in vision chest pain or chest tightness chills confusion cough facial flushing fever headache signs and symptoms of high blood sugar such as dizziness; dry mouth; dry skin; fruity breath; nausea; stomach pain; increased hunger or thirst; increased urination signs and symptoms of liver injury like dark yellow or brown urine; general ill feeling or flu-like symptoms; light-colored stools; loss of appetite; nausea; right upper belly pain; unusually weak or tired; yellowing of the eyes or skin stomach pain trouble passing urine or change in the amount of urine weight gain or weight loss Side effects that usually do not require medical attention (report these toyour doctor or health care professional if they continue or are bothersome): bone pain constipation loss of appetite muscle pain nausea swelling of the ankles, feet, hands tiredness This list may not describe all possible side effects. Call your doctor for medical advice about side effects. You may report side effects to FDA at1-800-FDA-1088. Where  should I keep my medication? This drug is given in a hospital or clinic and will not be stored at home. NOTE: This sheet is a summary. It may not cover all possible information. If you have questions about this medicine, talk to your doctor, pharmacist, orhealth care provider.  2022 Elsevier/Gold Standard (2019-12-04 13:01:29)

## 2021-06-09 NOTE — Progress Notes (Signed)
[facility] Telephone:([phone]) [phone]   Fax:(336) [phone]  OFFICE PROGRESS NOTE  [name], [place] [id number]  DIAGNOSIS: Stage IIIB non-small cell lung cancer, morphology and immunophenotype is consistent with poorly differentiated squamous cell carcinoma.  He presented with a centrally obstructing left upper lobe mass with contralateral mediastinal lymphadenopathy.  There is also an indeterminate right paramidline soft tissue nodule in the L2 vertebrae which is indeterminate.  He was diagnosed in April 2022.   PDL1 Expression: 0%   PRIOR THERAPY: Weekly concurrent chemoradiation with carboplatin for AUC of 2 and paclitaxel 45 mg/m2.  First dose expected on 01/31/21.   Status post 6 cycles.  Last dose was giving 03/22/2021.   CURRENT THERAPY: Consolidation immunotherapy with Imfinzi 1500 Mg IV every 4 weeks.  First dose April 26, 2021.  Status post 1 cycle.  INTERVAL HISTORY: Leon Lyons 87 y.o. adult returns to the clinic today for follow-up visit accompanied by his wife.  The patient is feeling fine today but 1 week after the start of the treatment he noticed some pain on the right side of the chest.  This was aggravated with maneuver by his chiropractor.  He denied having any current shortness of breath, cough or hemoptysis.  He denied having any fever or chills.  He has no nausea, vomiting, diarrhea or constipation.  He has no headache or visual changes.  He is here today for evaluation before starting cycle #2 of his treatment.  MEDICAL HISTORY: Past Medical History:  Diagnosis Date   History of radiation therapy 03/24/2021   left lung   02/11/2021-03/24/2021  Dr Gery Pray   Lung cancer [facility])    Tobacco use     ALLERGIES:  has No Known Allergies.  MEDICATIONS:  Current Outpatient Medications  Medication Sig Dispense Refill   acetaminophen (TYLENOL) 325 MG tablet Take 650 mg by mouth every 6 (six) hours as needed  for moderate pain or headache.     Ascorbic Acid (VITAMIN C) 1000 MG tablet Take 1,000 mg by mouth once a week. (Patient not taking: No sig reported)     cholecalciferol (VITAMIN D3) 25 MCG (1000 UNIT) tablet Take 1,000 Units by mouth once a week. (Patient not taking: No sig reported)     Dextromethorphan-guaiFENesin 5-100 MG/5ML LIQD Take by mouth. (Patient not taking: No sig reported)     lidocaine (XYLOCAINE) 2 % solution Mix 1 part 2% viscous lidocaine,1 part H2O. Swallow 54mL of diluted mixture, 61min before meals, up to QID. Use PRN soreness (Patient not taking: Reported on 04/25/2021) 100 mL 3   Magnesium 400 MG CAPS Take 400 mg by mouth once a week. (Patient not taking: No sig reported)     Multiple Vitamin (MULTIVITAMIN WITH MINERALS) TABS tablet Take 1 tablet by mouth once a week. (Patient not taking: No sig reported)     Omega-3 Fatty Acids (FISH OIL ULTRA) 1400 MG CAPS Take 1,400 mg by mouth once a week. (Patient not taking: Reported on 04/25/2021)     polyethylene glycol (MIRALAX / GLYCOLAX) 17 g packet Take 17 g by mouth daily. 14 each 0   prochlorperazine (COMPAZINE) 10 MG tablet Take 1 tablet (10 mg total) by mouth every 6 (six) hours as needed for nausea or vomiting. (Patient not taking: Reported on 04/25/2021) 30 tablet 0   sucralfate (CARAFATE) 1 g tablet Dissolve 1 tablet in 17mL H2O and swallow up to QID, PRN sore  throat. (Patient not taking: Reported on 04/25/2021) 40 tablet 2   vitamin E 180 MG (400 UNITS) capsule Take 400 Units by mouth once a week. (Patient not taking: No sig reported)     No current facility-administered medications for this visit.    SURGICAL HISTORY:  Past Surgical History:  Procedure Laterality Date   APPENDECTOMY     BRONCHIAL BIOPSY  01/07/2021   Procedure: BRONCHIAL BIOPSIES;  Surgeon: Garner Nash, DO;  Location: [facility];  Service: Pulmonary;;   BRONCHIAL BRUSHINGS  01/07/2021   Procedure: BRONCHIAL BRUSHINGS;  Surgeon: Garner Nash, DO;   Location: [facility];  Service: Pulmonary;;   BRONCHIAL NEEDLE ASPIRATION BIOPSY  01/07/2021   Procedure: BRONCHIAL NEEDLE ASPIRATION BIOPSIES;  Surgeon: Garner Nash, DO;  Location: [facility];  Service: Pulmonary;;   BRONCHIAL WASHINGS  01/07/2021   Procedure: BRONCHIAL WASHINGS;  Surgeon: Garner Nash, DO;  Location: [facility];  Service: Pulmonary;;   IRRIGATION AND DEBRIDEMENT SEBACEOUS CYST     VIDEO BRONCHOSCOPY WITH ENDOBRONCHIAL ULTRASOUND Bilateral 01/07/2021   Procedure: VIDEO BRONCHOSCOPY WITH ENDOBRONCHIAL ULTRASOUND;  Surgeon: Garner Nash, DO;  Location: [facility];  Service: Pulmonary;  Laterality: Bilateral;  possible need for radial ultrasound and fluoro     REVIEW OF SYSTEMS:  A comprehensive review of systems was negative except for: Constitutional: positive for fatigue Respiratory: positive for pleurisy/chest pain   PHYSICAL EXAMINATION: General appearance: alert, cooperative, and no distress Head: Normocephalic, without obvious abnormality, atraumatic Neck: no adenopathy, no JVD, supple, symmetrical, trachea midline, and thyroid not enlarged, symmetric, no tenderness/mass/nodules Lymph nodes: Cervical, supraclavicular, and axillary nodes normal. Resp: clear to auscultation bilaterally Back: symmetric, no curvature. ROM normal. No CVA tenderness. Cardio: regular rate and rhythm, S1, S2 normal, no murmur, click, rub or gallop GI: soft, non-tender; bowel sounds normal; no masses,  no organomegaly Extremities: extremities normal, atraumatic, no cyanosis or edema  ECOG PERFORMANCE STATUS: 1 - Symptomatic but completely ambulatory  Blood pressure (!) 115/54, pulse 65, temperature (!) 97.1 F (36.2 C), temperature source Tympanic, resp. rate 18, height $RemoveBe'5\' 8"'fIyPAtMgr$  (1.727 m), weight 174 lb 11.2 oz (79.2 kg), SpO2 100 %.  LABORATORY DATA: Lab Results  Component Value Date   WBC 3.4 (L) 06/09/2021   HGB 8.5 (L) 06/09/2021   HCT 26.0 (L) 06/09/2021   MCV 107.4 (H)  06/09/2021   PLT 257 06/09/2021      Chemistry      Component Value Date/Time   NA 139 06/09/2021 1032   K 4.1 06/09/2021 1032   CL 105 06/09/2021 1032   CO2 25 06/09/2021 1032   BUN 14 06/09/2021 1032   CREATININE 0.95 06/09/2021 1032      Component Value Date/Time   CALCIUM 9.0 06/09/2021 1032   ALKPHOS 59 06/09/2021 1032   AST 13 (L) 06/09/2021 1032   ALT 7 06/09/2021 1032   BILITOT 0.2 (L) 06/09/2021 1032       RADIOGRAPHIC STUDIES: No results found.  ASSESSMENT AND PLAN: This is a very pleasant 87 years old white male recently diagnosed with stage IIIb non-small cell lung cancer, poorly differentiated squamous cell carcinoma diagnosed in April 2022 with negative PD-L1 expression. The patient a course of concurrent chemoradiation with weekly carboplatin for AUC of 2 and paclitaxel 45 Mg/M2 status post 6 cycles.  He tolerated his treatment well except for fatigue and mild dysphagia. His restaging scan showed improvement of his disease. He is currently undergoing consolidation treatment with immunotherapy with Imfinzi 1500  Mg IV every 3 weeks status post 1 cycle. The patient tolerated the first cycle of his treatment well with no concerning adverse effects. I recommended for him to proceed with cycle #2 today as planned. For the right-sided chest pain, I will order chest x-ray to rule out any rib fractures. The patient will come back for follow-up visit in 4 weeks for evaluation before starting cycle #3 of his treatment. He was advised to call immediately if he has any concerning symptoms in the interval. The patient voices understanding of current disease status and treatment options and is in agreement with the current care plan.  All questions were answered. The patient knows to call the clinic with any problems, questions or concerns. We can certainly see the patient much sooner if necessary.    Disclaimer: This note was dictated with voice recognition software.  Similar sounding words can inadvertently be transcribed and may not be corrected upon review.

## 2021-06-15 ENCOUNTER — Telehealth: Payer: Self-pay | Admitting: Medical Oncology

## 2021-06-15 NOTE — Telephone Encounter (Signed)
Requests cxr result.

## 2021-07-01 NOTE — Progress Notes (Signed)
[facility] OFFICE PROGRESS NOTE  [name], MD [address] [place] [address]  DIAGNOSIS: Stage IIIB non-small cell lung cancer, morphology and immunophenotype is consistent with poorly differentiated squamous cell carcinoma.  He presented with a centrally obstructing left upper lobe mass with contralateral mediastinal lymphadenopathy.  There is also an indeterminate right paramidline soft tissue nodule in the L2 vertebrae which is indeterminate.  He was diagnosed in April 2022.   PDL1 Expression: 0%  PRIOR THERAPY: Weekly concurrent chemoradiation with carboplatin for AUC of 2 and paclitaxel 45 mg/m2.  First dose expected on 01/31/21.   Status post 6 cycles.  Last dose was giving 03/22/2021.  CURRENT THERAPY: Consolidation immunotherapy with Imfinzi 1500 Mg IV every 4 weeks.  First dose 05/11/21. Status post 2 cycles.   INTERVAL HISTORY: Leon Lyons 87 y.o. adult returns to the clinic today for a follow-up visit accompanied by his wife.  The patient is feeling fairly well today without any concerning complaints except for pulled muscle in his left shoulder with pulling shrubs out his front yard recently.  He also has been having an issue with a "knot" in his left calf since the first of the year. The knot is not changing in size but is tender.  He has been seeing his primary care provider for this and had an ultrasound of the lower extremity performed to rule out DVT which was negative.  He is scheduled to see a physician from orthopedics next week on Tuesday regarding this.  No overlying skin changes or lower extremity swelling.  He denies any fever, chills, or night sweats. He had some weight loss with his last course of treatment despite reportedly having a good appetite.  He drinks supplemental drinks but ran out this week and is going to get more.  He lost 7 pounds since his last appointment.  He denies any nausea, vomiting, diarrhea, or constipation.   He denies any headache or visual changes.  He denies any rashes or skin changes.  He denies any chest pain, shortness of breath, cough, or hemoptysis except when he uses his incentive spirometer he noticed he has less lung capacity and coughs when doing this exercise.  He is taking a multivitamin and iron for his anemia and he is wondering if he still needs to take this.  He is here today for evaluation and repeat blood work before starting cycle #3.    MEDICAL HISTORY: Past Medical History:  Diagnosis Date   History of radiation therapy 03/24/2021   left lung   02/11/2021-03/24/2021  Dr Gery Pray   Lung cancer [facility])    Tobacco use     ALLERGIES:  has No Known Allergies.  MEDICATIONS:  Current Outpatient Medications  Medication Sig Dispense Refill   acetaminophen (TYLENOL) 325 MG tablet Take 650 mg by mouth every 6 (six) hours as needed for moderate pain or headache.     Ascorbic Acid (VITAMIN C) 1000 MG tablet Take 1,000 mg by mouth once a week. (Patient not taking: No sig reported)     cholecalciferol (VITAMIN D3) 25 MCG (1000 UNIT) tablet Take 1,000 Units by mouth once a week. (Patient not taking: No sig reported)     Dextromethorphan-guaiFENesin 5-100 MG/5ML LIQD Take by mouth. (Patient not taking: No sig reported)     lidocaine (XYLOCAINE) 2 % solution Mix 1 part 2% viscous lidocaine,1 part H2O. Swallow 32m of diluted mixture, 372m before meals, up to QID. Use PRN  soreness (Patient not taking: Reported on 04/25/2021) 100 mL 3   Magnesium 400 MG CAPS Take 400 mg by mouth once a week. (Patient not taking: No sig reported)     Multiple Vitamin (MULTIVITAMIN WITH MINERALS) TABS tablet Take 1 tablet by mouth once a week. (Patient not taking: No sig reported)     Omega-3 Fatty Acids (FISH OIL ULTRA) 1400 MG CAPS Take 1,400 mg by mouth once a week. (Patient not taking: Reported on 04/25/2021)     polyethylene glycol (MIRALAX / GLYCOLAX) 17 g packet Take 17 g by mouth daily. 14 each 0    prochlorperazine (COMPAZINE) 10 MG tablet Take 1 tablet (10 mg total) by mouth every 6 (six) hours as needed for nausea or vomiting. (Patient not taking: Reported on 04/25/2021) 30 tablet 0   sucralfate (CARAFATE) 1 g tablet Dissolve 1 tablet in 72m H2O and swallow up to QID, PRN sore throat. (Patient not taking: Reported on 04/25/2021) 40 tablet 2   vitamin E 180 MG (400 UNITS) capsule Take 400 Units by mouth once a week. (Patient not taking: No sig reported)     No current facility-administered medications for this visit.    SURGICAL HISTORY:  Past Surgical History:  Procedure Laterality Date   APPENDECTOMY     BRONCHIAL BIOPSY  01/07/2021   Procedure: BRONCHIAL BIOPSIES;  Surgeon: IGarner Nash DO;  [facility];  Service: Pulmonary;;   BRONCHIAL BRUSHINGS  01/07/2021   Procedure: BRONCHIAL BRUSHINGS;  Surgeon: IGarner Nash DO;  [facility];  Service: Pulmonary;;   BRONCHIAL NEEDLE ASPIRATION BIOPSY  01/07/2021   Procedure: BRONCHIAL NEEDLE ASPIRATION BIOPSIES;  Surgeon: IGarner Nash DO;  [facility]  Service: Pulmonary;;   BRONCHIAL WASHINGS  01/07/2021   Procedure: BRONCHIAL WASHINGS;  Surgeon: IGarner Nash DO;  [facility]  Service: Pulmonary;;   IRRIGATION AND DEBRIDEMENT SEBACEOUS CYST     VIDEO BRONCHOSCOPY WITH ENDOBRONCHIAL ULTRASOUND Bilateral 01/07/2021   Procedure: VIDEO BRONCHOSCOPY WITH ENDOBRONCHIAL ULTRASOUND;  Surgeon: IGarner Nash DO;  [facility]  Service: Pulmonary;  Laterality: Bilateral;  possible need for radial ultrasound and fluoro     REVIEW OF SYSTEMS:   Review of Systems  Constitutional: Negative for appetite change, chills, fatigue, fever and unexpected weight change.  HENT: Negative for mouth sores, nosebleeds, sore throat and trouble swallowing.   Eyes: Negative for eye problems and icterus.  Respiratory: Positive for cough with deep breath. Negative for cough, hemoptysis, shortness of  breath and wheezing.   Cardiovascular: Negative for chest pain and leg swelling.  Gastrointestinal: Negative for abdominal pain, constipation, diarrhea, nausea and vomiting.  Genitourinary: Negative for bladder incontinence, difficulty urinating, dysuria, frequency and hematuria.   Musculoskeletal: Positive for left shoulder pain and left calf pain/knot.  Negative for back pain, gait problem, neck pain and neck stiffness.  Skin: Negative for itching and rash.  Neurological: Negative for dizziness, extremity weakness, gait problem, headaches, light-headedness and seizures.  Hematological: Negative for adenopathy. Does not bruise/bleed easily.  Psychiatric/Behavioral: Negative for confusion, depression and sleep disturbance. The patient is not nervous/anxious.     PHYSICAL EXAMINATION:  Blood pressure (!) 128/56, pulse 78, temperature 98.3 F (36.8 C), temperature source Oral, resp. rate 19, height _0  (1.727 m), weight 167 lb 8 oz (76 kg), SpO2 98 %.  ECOG PERFORMANCE STATUS: 1  Physical Exam  Constitutional: Oriented to person, place, and time and well-developed, well-nourished, and in no distress.  HENT:  Head:  Normocephalic and atraumatic.  Mouth/Throat: Oropharynx is clear and moist. No oropharyngeal exudate.  Eyes: Conjunctivae are normal. Right eye exhibits no discharge. Left eye exhibits no discharge. No scleral icterus.  Neck: Normal range of motion. Neck supple.  Cardiovascular: Normal rate, regular rhythm, normal heart sounds and intact distal pulses.   Pulmonary/Chest: Effort normal and breath sounds normal. No respiratory distress. No wheezes. No rales.  Abdominal: Soft. Bowel sounds are normal. Exhibits no distension and no mass. There is no tenderness.  Musculoskeletal: Normal range of motion. Exhibits no edema.  Lymphadenopathy:    No cervical adenopathy.  Neurological: Alert and oriented to person, place, and time. Exhibits normal muscle tone. Gait normal. Coordination  normal.  Skin: Skin is warm and dry. No rash noted. Not diaphoretic. No erythema. No pallor.  Psychiatric: Mood, memory and judgment normal.  Vitals reviewed.  LABORATORY DATA: Lab Results  Component Value Date   WBC 4.6 07/07/2021   HGB 9.1 (L) 07/07/2021   HCT 29.0 (L) 07/07/2021   MCV 105.8 (H) 07/07/2021   PLT 332 07/07/2021      Chemistry      Component Value Date/Time   NA 138 07/07/2021 1002   K 3.9 07/07/2021 1002   CL 104 07/07/2021 1002   CO2 25 07/07/2021 1002   BUN 14 07/07/2021 1002   CREATININE 0.97 07/07/2021 1002      Component Value Date/Time   CALCIUM 8.9 07/07/2021 1002   ALKPHOS 67 07/07/2021 1002   AST 15 07/07/2021 1002   ALT 7 07/07/2021 1002   BILITOT <0.2 (L) 07/07/2021 1002       RADIOGRAPHIC STUDIES:  DG Chest 1 View  Result Date: 06/10/2021 CLINICAL DATA:  RIGHT anterior rib pain, history lung cancer, smoker EXAM: CHEST  1 VIEW COMPARISON:  CT chest 04/18/2021 FINDINGS: Enlargement of cardiac silhouette. Calcified tortuous thoracic aorta. Mediastinal contours and pulmonary vascularity otherwise normal. Chronic opacity in the LEFT perihilar region; patient has chronic LEFT perihilar changes from treatment of lung cancer but opacity has increased since previous exam question superimposed infiltrate Remaining lungs clear. No pleural effusion or pneumothorax. Bones demineralized without fracture or bone destruction. IMPRESSION: Post therapy changes for lung cancer in LEFT perihilar region with question superimposed LEFT perihilar infiltrate. Enlargement of cardiac silhouette. No acute RIGHT rib abnormalities. Aortic Atherosclerosis (ICD10-I70.0). Electronically Signed   By: Lavonia Dana M.D.   On: 06/10/2021 12:57     ASSESSMENT/PLAN:  This is a very pleasant 87 year old male diagnosed with stage IIIB non-small cell lung cancer, most consistent with squamous cell carcinoma.  He presented with a centrally obstructing left upper lobe lung mass with  contralateral mediastinal lymphadenopathy.  There is also an indeterminate midline soft tissue nodule posterior to the L2 vertebrae which is indeterminate.  He was diagnosed in April 2022.  His PD-L1 expression was 0%.   The patient completed course of concurrent chemoradiation with weekly carboplatin for an AUC of 2 and paclitaxel 45 mg per metered squared.  He is status post 6 cycles of treatment.  He tolerated this well without any concerning adverse side effects except for dysphagia.  The patient is currently undergoing consolidation immunotherapy with Imfinzi 1500 mg IV every 4 weeks.  He is status post 2 cycles and tolerating this well.   Labs were reviewed.  Recommend that he proceed with cycle #3 today scheduled.  I will arrange for restaging CT scan prior to starting his next cycle of treatment.  We will see him back for  follow-up visit in 4 weeks for evaluation before starting cycle #4.  He will follow with orthopedics next week as scheduled for the knot in his lower extremity.  The patient is interested in evaluating his iron studies to see if he still needs to continue to take this for his anemia.  I have placed an order for iron studies and ferritin to be drawn at his next lab draw.  The patient was advised to call immediately if he has any concerning symptoms in the interval. The patient voices understanding of current disease status and treatment options and is in agreement with the current care plan. All questions were answered. The patient knows to call the clinic with any problems, questions or concerns. We can certainly see the patient much sooner if necessary      Orders Placed This Encounter  Procedures   CT Chest W Contrast    Standing Status:   Future    Standing Expiration Date:   07/07/2022    Order Specific Question:   If indicated for the ordered procedure, I authorize the administration of contrast media per Radiology protocol    Answer:   Yes    Order  Specific Question:   Preferred imaging location?    Answer:   [facility]   Iron and TIBC    Standing Status:   Future    Standing Expiration Date:   07/07/2022   Ferritin    Standing Status:   Future    Standing Expiration Date:   07/07/2022      The total time spent in the appointment was 20-29 minutes.  Burnice Vassel L Tiffancy Moger, PA-C 07/07/21

## 2021-07-07 ENCOUNTER — Inpatient Hospital Stay: Payer: Medicare Other

## 2021-07-07 ENCOUNTER — Inpatient Hospital Stay (HOSPITAL_BASED_OUTPATIENT_CLINIC_OR_DEPARTMENT_OTHER): Payer: Medicare Other | Admitting: Physician Assistant

## 2021-07-07 ENCOUNTER — Other Ambulatory Visit: Payer: Self-pay

## 2021-07-07 VITALS — BP 128/56 | HR 78 | Temp 98.3°F | Resp 19 | Ht 68.0 in | Wt 167.5 lb

## 2021-07-07 DIAGNOSIS — Z79899 Other long term (current) drug therapy: Secondary | ICD-10-CM | POA: Insufficient documentation

## 2021-07-07 DIAGNOSIS — Z9221 Personal history of antineoplastic chemotherapy: Secondary | ICD-10-CM | POA: Diagnosis not present

## 2021-07-07 DIAGNOSIS — C3492 Malignant neoplasm of unspecified part of left bronchus or lung: Secondary | ICD-10-CM

## 2021-07-07 DIAGNOSIS — Z923 Personal history of irradiation: Secondary | ICD-10-CM | POA: Insufficient documentation

## 2021-07-07 DIAGNOSIS — Z5112 Encounter for antineoplastic immunotherapy: Secondary | ICD-10-CM | POA: Insufficient documentation

## 2021-07-07 DIAGNOSIS — R5382 Chronic fatigue, unspecified: Secondary | ICD-10-CM

## 2021-07-07 DIAGNOSIS — C3412 Malignant neoplasm of upper lobe, left bronchus or lung: Secondary | ICD-10-CM | POA: Diagnosis present

## 2021-07-07 LAB — CMP (CANCER CENTER ONLY)
ALT: 7 U/L (ref 0–44)
AST: 15 U/L (ref 15–41)
Albumin: 2.6 g/dL — ABNORMAL LOW (ref 3.5–5.0)
Alkaline Phosphatase: 67 U/L (ref 38–126)
Anion gap: 9 (ref 5–15)
BUN: 14 mg/dL (ref 8–23)
CO2: 25 mmol/L (ref 22–32)
Calcium: 8.9 mg/dL (ref 8.9–10.3)
Chloride: 104 mmol/L (ref 98–111)
Creatinine: 0.97 mg/dL (ref 0.61–1.24)
GFR, Estimated: >60 mL/min (ref >60)
Glucose, Bld: 81 mg/dL (ref 70–99)
Potassium: 3.9 mmol/L (ref 3.5–5.1)
Sodium: 138 mmol/L (ref 135–145)
Total Bilirubin: <0.2 mg/dL — ABNORMAL LOW (ref 0.3–1.2)
Total Protein: 8.4 g/dL — ABNORMAL HIGH (ref 6.5–8.1)

## 2021-07-07 LAB — CBC WITH DIFFERENTIAL (CANCER CENTER ONLY)
Abs Immature Granulocytes: 0.02 10*3/uL (ref 0.00–0.07)
Basophils Absolute: 0 10*3/uL (ref 0.0–0.1)
Basophils Relative: 1 %
Eosinophils Absolute: 0.1 10*3/uL (ref 0.0–0.5)
Eosinophils Relative: 1 %
HCT: 29 % — ABNORMAL LOW (ref 39.0–52.0)
Hemoglobin: 9.1 g/dL — ABNORMAL LOW (ref 13.0–17.0)
Immature Granulocytes: 0 %
Lymphocytes Relative: 20 %
Lymphs Abs: 0.9 10*3/uL (ref 0.7–4.0)
MCH: 33.2 pg (ref 26.0–34.0)
MCHC: 31.4 g/dL (ref 30.0–36.0)
MCV: 105.8 fL — ABNORMAL HIGH (ref 80.0–100.0)
Monocytes Absolute: 0.5 10*3/uL (ref 0.1–1.0)
Monocytes Relative: 10 %
Neutro Abs: 3.1 10*3/uL (ref 1.7–7.7)
Neutrophils Relative %: 68 %
Platelet Count: 332 10*3/uL (ref 150–400)
RBC: 2.74 MIL/uL — ABNORMAL LOW (ref 4.22–5.81)
RDW: 13.7 % (ref 11.5–15.5)
WBC Count: 4.6 10*3/uL (ref 4.0–10.5)
nRBC: 0 % (ref 0.0–0.2)

## 2021-07-07 LAB — TSH: TSH: 3.228 u[IU]/mL (ref 0.320–4.118)

## 2021-07-07 MED ORDER — SODIUM CHLORIDE 0.9 % IV SOLN: Freq: Once | INTRAVENOUS | Status: AC

## 2021-07-07 MED ORDER — SODIUM CHLORIDE 0.9 % IV SOLN
1500.0000 mg | Freq: Once | INTRAVENOUS | Status: AC
  Administered 2021-07-07: 1500 mg via INTRAVENOUS
  Filled 2021-07-07: qty 30

## 2021-07-07 NOTE — Patient Instructions (Signed)
[facility]  Discharge Instructions: Thank you for choosing [facility] to provide your oncology and hematology care.   If you have a lab appointment with the Cancer Center, please go directly to the Cancer Center and check in at the registration area.   Wear comfortable clothing and clothing appropriate for easy access to any Portacath or PICC line.   We strive to give you quality time with your provider. You may need to reschedule your appointment if you arrive late (15 or more minutes).  Arriving late affects you and other patients whose appointments are after yours.  Also, if you miss three or more appointments without notifying the office, you may be dismissed from the clinic at the provider's discretion.      For prescription refill requests, have your pharmacy contact our office and allow 72 hours for refills to be completed.    Today you received the following chemotherapy and/or immunotherapy agents imfinzi       To help prevent nausea and vomiting after your treatment, we encourage you to take your nausea medication as directed.  BELOW ARE SYMPTOMS THAT SHOULD BE REPORTED IMMEDIATELY: *FEVER GREATER THAN 100.4 F (38 C) OR HIGHER *CHILLS OR SWEATING *NAUSEA AND VOMITING THAT IS NOT CONTROLLED WITH YOUR NAUSEA MEDICATION *UNUSUAL SHORTNESS OF BREATH *UNUSUAL BRUISING OR BLEEDING *URINARY PROBLEMS (pain or burning when urinating, or frequent urination) *BOWEL PROBLEMS (unusual diarrhea, constipation, pain near the anus) TENDERNESS IN MOUTH AND THROAT WITH OR WITHOUT PRESENCE OF ULCERS (sore throat, sores in mouth, or a toothache) UNUSUAL RASH, SWELLING OR PAIN  UNUSUAL VAGINAL DISCHARGE OR ITCHING   Items with * indicate a potential emergency and should be followed up as soon as possible or go to the Emergency Department if any problems should occur.  Please show the CHEMOTHERAPY ALERT CARD or IMMUNOTHERAPY ALERT CARD at check-in to  the Emergency Department and triage nurse.  Should you have questions after your visit or need to cancel or reschedule your appointment, please contact [facility]  Dept: [phone]  and follow the prompts.  Office hours are 8:00 a.m. to 4:30 p.m. Monday - Friday. Please note that voicemails left after 4:00 p.m. may not be returned until the following business day.  We are closed weekends and major holidays. You have access to a nurse at all times for urgent questions. Please call the main number to the clinic Dept: [phone] and follow the prompts.   For any non-urgent questions, you may also contact your provider using MyChart. We now offer e-Visits for anyone 18 and older to request care online for non-urgent symptoms. For details visit [email or web address].   Also download the MyChart app! Go to the app store, search "MyChart", open the app, select , and log in with your MyChart username and password.  Due to Covid, a mask is required upon entering the hospital/clinic. If you do not have a mask, one will be given to you upon arrival. For doctor visits, patients may have 1 support person aged 18 or older with them. For treatment visits, patients cannot have anyone with them due to current Covid guidelines and our immunocompromised population.   

## 2021-07-08 ENCOUNTER — Ambulatory Visit: Payer: Medicare Other

## 2021-08-01 ENCOUNTER — Telehealth: Payer: Self-pay

## 2021-08-01 ENCOUNTER — Ambulatory Visit (HOSPITAL_COMMUNITY): Payer: Medicare Other

## 2021-08-01 NOTE — Telephone Encounter (Signed)
Pts wife called stating pt will not be able to make his 08/03/21 appts. Pt is currently inpatient at a hospital in [place] and states his large intestines were twisted requiring emergency surgery. She states he will remain in the hospital for at least 5 days.  Discussed with Dr. Julien Nordmann who advise it's okay for pt to delay his tx until his next scheduled tx on 08/31/21 to allow time for his discharge and recuperate.  I have called pts wife back and advised of this. She expressed understanding of this information and will also r/s his CT to ensure he has it before his 08/31/21 appt.

## 2021-08-03 ENCOUNTER — Inpatient Hospital Stay: Payer: Medicare Other | Admitting: Internal Medicine

## 2021-08-03 ENCOUNTER — Inpatient Hospital Stay: Payer: Medicare Other

## 2021-08-04 ENCOUNTER — Other Ambulatory Visit: Payer: Medicare Other

## 2021-08-04 ENCOUNTER — Ambulatory Visit: Payer: Medicare Other

## 2021-08-04 ENCOUNTER — Ambulatory Visit: Payer: Medicare Other | Admitting: Internal Medicine

## 2021-08-09 ENCOUNTER — Other Ambulatory Visit: Payer: Self-pay | Admitting: *Deleted

## 2021-08-09 NOTE — Patient Outreach (Signed)
[facility]) Care Management  94/06/4472  Masayuki Sakai [place] [birth year] [id number]   Referral Date: 10/22 Referral Source: Data Analysis Date of Admission:  Diagnosis: unknown Date of Discharge: 10/30 Facility: [place]: [place] attempt #1, unsuccessful to both home and mobile numbers.  Noted that both numbers are also the same for wife, DPR on file.    Plan: RN CM will send outreach letter and follow up within the next 3-4 business days.  Valente David, [place], MSN [place] [phone]

## 2021-08-12 ENCOUNTER — Other Ambulatory Visit: Payer: Self-pay | Admitting: *Deleted

## 2021-08-12 ENCOUNTER — Encounter: Payer: Self-pay | Admitting: *Deleted

## 2021-08-12 NOTE — Patient Outreach (Signed)
[facility]) Care Management  [name]  05/15/5783   Leon Lyons [birth year] [id number]    Referral Date: 10/22 Referral Source: Data Analysis Date of Admission:  Diagnosis: unknown Date of Discharge: 10/30 Facility: [place]: [place] attempt #2, successful.  Identity verified.  This care manager introduced self and stated purpose of call.  [facility] care management services explained.     Social: Member lives with wife, report he is able to perform ADL's independently.  Uses walker for ambulation.    Conditions: Per chart, history of lung cancer, chemo and radiation completed, not active with cycles of immunotherapy infusions.  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  Medications: Reviewed with member, only taking Gabapentin and Tramadol.  Other medications listed are vitamins, not taking currently, waiting until he is done with immunotherapy sessions to restart.  Appointments: Was seen by surgeon on 11/1, some staples were removed, will follow up on 11/10 to finish staple removal.  Denies signs of infection.  Was seen by PCP yesterday, will follow up in a month.  Member and wife requested to have [facility] referral, waiting call from agency.  Consent: Agrees to [facility] involvement.   Encounter Medications:  Outpatient Encounter Medications as of 08/12/2021  Medication Sig Note   acetaminophen (TYLENOL) 325 MG tablet Take 650 mg by mouth every 6 (six) hours as needed for moderate pain or headache.    gabapentin (NEURONTIN) 300 MG capsule Take 300 mg by mouth at bedtime.    polyethylene glycol (MIRALAX / GLYCOLAX) 17 g packet Take 17 g by mouth daily.    traMADol (ULTRAM) 50 MG tablet Take by mouth every 6 (six) hours as needed.    Ascorbic Acid (VITAMIN C) 1000 MG tablet Take 1,000 mg by mouth once a week. (Patient not taking: No sig reported) 03/21/2021: "I am holding all my vitamins while getting treatment"    cholecalciferol (VITAMIN D3) 25 MCG (1000 UNIT) tablet Take 1,000 Units by mouth once a week. (Patient not taking: No sig reported)    Dextromethorphan-guaiFENesin 5-100 MG/5ML LIQD Take by mouth. (Patient not taking: No sig reported)    lidocaine (XYLOCAINE) 2 % solution Mix 1 part 2% viscous lidocaine,1 part H2O. Swallow 72mL of diluted mixture, 77min before meals, up to QID. Use PRN soreness (Patient not taking: No sig reported)    Magnesium 400 MG CAPS Take 400 mg by mouth once a week. (Patient not taking: No sig reported)    Multiple Vitamin (MULTIVITAMIN WITH MINERALS) TABS tablet Take 1 tablet by mouth once a week. (Patient not taking: No sig reported)    Omega-3 Fatty Acids (FISH OIL ULTRA) 1400 MG CAPS Take 1,400 mg by mouth once a week. (Patient not taking: No sig reported)    prochlorperazine (COMPAZINE) 10 MG tablet Take 1 tablet (10 mg total) by mouth every 6 (six) hours as needed for nausea or vomiting. (Patient not taking: No sig reported)    sucralfate (CARAFATE) 1 g tablet Dissolve 1 tablet in 78mL H2O and swallow up to QID, PRN sore throat. (Patient not taking: No sig reported)    vitamin E 180 MG (400 UNITS) capsule Take 400 Units by mouth once a week. (Patient not taking: No sig reported)    No facility-administered encounter medications on file as of 08/12/2021.    Functional Status:  No flowsheet data found.  Fall/Depression Screening: Fall Risk  08/12/2021  Falls in the past year? 0  Number falls in  past yr: 0  Injury with Fall? 0   PHQ 2/9 Scores 08/12/2021  PHQ - 2 Score 0     Care Plan Care Plan : RNCM Plan of Care (Adult)  Updates made by Valente David, RN since 08/12/2021 12:00 AM     Problem: Management of health conditions post surgery   Priority: High     Goal: Member will report and display recovery from surgery evidenced by no readmissions or infections   Start Date: 08/12/2021  Expected End Date: 11/10/2021  Priority: High  Note:   Current Barriers:   Knowledge Deficits related to plan of care for management of Post surgery for Cecal Volvulus Chronic Disease Management support and education needs related to Post surgery for cecal volvulus  RNCM Clinical Goal(s):  Patient will verbalize understanding of plan for management of Post surgery for cecal volvulus take all medications exactly as prescribed and will call provider for medication related questions attend all scheduled medical appointments: Surgery on 11/10 and PCP in the next month continue to work with RN Care Manager to address care management and care coordination needs related to  cecal volvulus, post surgery work with PCP to obtain [facility] services  through collaboration with Consulting civil engineer, provider, and care team.   Interventions: Inter-disciplinary care team collaboration (see longitudinal plan of care) Evaluation of current treatment plan related to  self management and patient's adherence to plan as established by provider  Post surgical care for cecal volvulusNew goal. Evaluation of current treatment plan related to  Surgery for cecal volvulus , self-management and patient's adherence to plan as established by provider. Discussed plans with patient for ongoing care management follow up and provided patient with direct contact information for care management team Advised patient to call PCP if no call from [place] within the next week; Reviewed medications with patient and discussed pain management; Collaborated with Member regarding need for [facility] services (PT and nursing); Reviewed scheduled/upcoming provider appointments including; Discussed plans with patient for ongoing care management follow up and provided patient with direct contact information for care management team; Encouraged to use incentive spirometry to decrease risk of pneumonia  Patient Goals/Self-Care Activities: Patient will self administer medications as prescribed Patient will attend all scheduled  provider appointments Patient will continue to perform ADL's independently Patient will call provider office for new concerns or questions  Follow Up Plan:  Telephone follow up appointment with care management team member scheduled for:  08/19/2021          Plan:  Follow-up: Patient agrees to Care Plan and Follow-up. Follow-up in 1 week(s).  Valente David, [place], MSN [place] [phone]

## 2021-08-15 DIAGNOSIS — I351 Nonrheumatic aortic (valve) insufficiency: Secondary | ICD-10-CM

## 2021-08-19 ENCOUNTER — Other Ambulatory Visit: Payer: Self-pay | Admitting: *Deleted

## 2021-08-19 NOTE — Patient Outreach (Signed)
[facility]) Care Management  39/43/2001  [place] [birth year] [id number]   Weekly transition of care call placed to member, no answer.  HIPAA compliant voice message left, will follow up within the next 3-4 business days.  Valente David, [place], MSN [place] [phone]

## 2021-08-23 ENCOUNTER — Telehealth: Payer: Self-pay | Admitting: Medical Oncology

## 2021-08-23 NOTE — Telephone Encounter (Signed)
Leon Lyons to cancel appts next week. Done. I returned her call an no answer -mailbox full.

## 2021-08-23 NOTE — Telephone Encounter (Signed)
Pt is in [facility] . Returned April's call and LVM to call me back.

## 2021-08-24 ENCOUNTER — Telehealth: Payer: Self-pay

## 2021-08-24 NOTE — Telephone Encounter (Signed)
Pts wife called stating pt is still in the hospitalized in [place]. She states they have done a scan and it appears the cancer has spread but wants Dr. Julien Nordmann to review the report for his opinion and to please call her. (She is very tearful).  Faxed report has been received and placed on Dr. Ellan Lambert desk for review.

## 2021-08-25 ENCOUNTER — Other Ambulatory Visit: Payer: Self-pay | Admitting: *Deleted

## 2021-08-25 NOTE — Patient Outreach (Signed)
[facility]) Care Management  09/98/3382  [place] [birth year] [phone]   Outreach attempt #2, unsuccessful, HIPAA compliant voice messages left on both home and mobile numbers.  Noted that same numbers are listed for wife.  Will send outreach letter and follow up within the next 3-4 business days.  Valente David, [place], MSN [place] [phone]

## 2021-08-26 ENCOUNTER — Other Ambulatory Visit: Payer: Self-pay

## 2021-08-30 ENCOUNTER — Telehealth: Payer: Self-pay | Admitting: Medical Oncology

## 2021-08-30 NOTE — Telephone Encounter (Signed)
Requested scan reports and imaging pushed for [facility] to view.

## 2021-08-30 NOTE — Telephone Encounter (Signed)
Pt discharging from rehab on Thursday because pt wants to go home.

## 2021-08-31 ENCOUNTER — Other Ambulatory Visit: Payer: Medicare Other

## 2021-08-31 ENCOUNTER — Ambulatory Visit: Payer: Medicare Other

## 2021-08-31 ENCOUNTER — Ambulatory Visit: Payer: Medicare Other | Admitting: Physician Assistant

## 2021-08-31 ENCOUNTER — Other Ambulatory Visit: Payer: Self-pay | Admitting: *Deleted

## 2021-08-31 NOTE — Patient Outreach (Signed)
[facility]) Care Management  91/22/5834  [place] 03/[birth year]70 [phone]   Outreach attempt #3, unsuccessful, HIPAA compliant voice message left.  Will make 4th and final attempt within the next 4 weeks, if remain unsuccessful, will close case due to inability to maintain contact.  Valente David, [place], MSN [place] [phone]

## 2021-09-09 ENCOUNTER — Other Ambulatory Visit: Payer: Self-pay

## 2021-09-09 ENCOUNTER — Telehealth: Payer: Self-pay

## 2021-09-09 ENCOUNTER — Encounter (HOSPITAL_COMMUNITY): Payer: Self-pay

## 2021-09-09 ENCOUNTER — Emergency Department (HOSPITAL_COMMUNITY): Payer: Medicare Other

## 2021-09-09 ENCOUNTER — Inpatient Hospital Stay (HOSPITAL_COMMUNITY)
Admission: EM | Admit: 2021-09-09 | Discharge: 2021-09-17 | DRG: 268 | Disposition: A | Discharge status: Rehabilitation Facility | Payer: Medicare Other | Attending: Internal Medicine | Admitting: Internal Medicine

## 2021-09-09 DIAGNOSIS — E860 Dehydration: Secondary | ICD-10-CM | POA: Diagnosis present

## 2021-09-09 DIAGNOSIS — G8929 Other chronic pain: Secondary | ICD-10-CM | POA: Diagnosis present

## 2021-09-09 DIAGNOSIS — Y832 Surgical operation with anastomosis, bypass or graft as the cause of abnormal reaction of the patient, or of later complication, without mention of misadventure at the time of the procedure: Secondary | ICD-10-CM | POA: Diagnosis present

## 2021-09-09 DIAGNOSIS — Z9221 Personal history of antineoplastic chemotherapy: Secondary | ICD-10-CM

## 2021-09-09 DIAGNOSIS — I4891 Unspecified atrial fibrillation: Secondary | ICD-10-CM

## 2021-09-09 DIAGNOSIS — E871 Hypo-osmolality and hyponatremia: Secondary | ICD-10-CM | POA: Diagnosis present

## 2021-09-09 DIAGNOSIS — I959 Hypotension, unspecified: Secondary | ICD-10-CM | POA: Diagnosis present

## 2021-09-09 DIAGNOSIS — Y733 Surgical instruments, materials and gastroenterology and urology devices (including sutures) associated with adverse incidents: Secondary | ICD-10-CM | POA: Diagnosis present

## 2021-09-09 DIAGNOSIS — Z79899 Other long term (current) drug therapy: Secondary | ICD-10-CM

## 2021-09-09 DIAGNOSIS — E785 Hyperlipidemia, unspecified: Secondary | ICD-10-CM | POA: Diagnosis present

## 2021-09-09 DIAGNOSIS — Z8679 Personal history of other diseases of the circulatory system: Secondary | ICD-10-CM

## 2021-09-09 DIAGNOSIS — Z9889 Other specified postprocedural states: Secondary | ICD-10-CM

## 2021-09-09 DIAGNOSIS — C787 Secondary malignant neoplasm of liver and intrahepatic bile duct: Secondary | ICD-10-CM | POA: Diagnosis present

## 2021-09-09 DIAGNOSIS — M25562 Pain in left knee: Secondary | ICD-10-CM | POA: Diagnosis present

## 2021-09-09 DIAGNOSIS — C3492 Malignant neoplasm of unspecified part of left bronchus or lung: Secondary | ICD-10-CM | POA: Diagnosis present

## 2021-09-09 DIAGNOSIS — M549 Dorsalgia, unspecified: Secondary | ICD-10-CM | POA: Diagnosis present

## 2021-09-09 DIAGNOSIS — Z7901 Long term (current) use of anticoagulants: Secondary | ICD-10-CM

## 2021-09-09 DIAGNOSIS — J9811 Atelectasis: Secondary | ICD-10-CM | POA: Diagnosis present

## 2021-09-09 DIAGNOSIS — R7881 Bacteremia: Secondary | ICD-10-CM

## 2021-09-09 DIAGNOSIS — C3412 Malignant neoplasm of upper lobe, left bronchus or lung: Secondary | ICD-10-CM | POA: Diagnosis present

## 2021-09-09 DIAGNOSIS — Z95828 Presence of other vascular implants and grafts: Secondary | ICD-10-CM

## 2021-09-09 DIAGNOSIS — I714 Abdominal aortic aneurysm, without rupture, unspecified: Principal | ICD-10-CM | POA: Diagnosis present

## 2021-09-09 DIAGNOSIS — D696 Thrombocytopenia, unspecified: Secondary | ICD-10-CM | POA: Diagnosis not present

## 2021-09-09 DIAGNOSIS — Z20822 Contact with and (suspected) exposure to covid-19: Secondary | ICD-10-CM | POA: Diagnosis present

## 2021-09-09 DIAGNOSIS — E8809 Other disorders of plasma-protein metabolism, not elsewhere classified: Secondary | ICD-10-CM | POA: Diagnosis present

## 2021-09-09 DIAGNOSIS — K5720 Diverticulitis of large intestine with perforation and abscess without bleeding: Secondary | ICD-10-CM | POA: Diagnosis present

## 2021-09-09 DIAGNOSIS — Z923 Personal history of irradiation: Secondary | ICD-10-CM

## 2021-09-09 DIAGNOSIS — D649 Anemia, unspecified: Secondary | ICD-10-CM | POA: Diagnosis not present

## 2021-09-09 DIAGNOSIS — I4510 Unspecified right bundle-branch block: Secondary | ICD-10-CM | POA: Diagnosis present

## 2021-09-09 DIAGNOSIS — K922 Gastrointestinal hemorrhage, unspecified: Secondary | ICD-10-CM

## 2021-09-09 DIAGNOSIS — E43 Unspecified severe protein-calorie malnutrition: Secondary | ICD-10-CM | POA: Diagnosis present

## 2021-09-09 DIAGNOSIS — Z6823 Body mass index (BMI) 23.0-23.9, adult: Secondary | ICD-10-CM

## 2021-09-09 DIAGNOSIS — E876 Hypokalemia: Secondary | ICD-10-CM | POA: Diagnosis not present

## 2021-09-09 DIAGNOSIS — C7951 Secondary malignant neoplasm of bone: Secondary | ICD-10-CM | POA: Diagnosis present

## 2021-09-09 DIAGNOSIS — R54 Age-related physical debility: Secondary | ICD-10-CM | POA: Diagnosis present

## 2021-09-09 DIAGNOSIS — E440 Moderate protein-calorie malnutrition: Secondary | ICD-10-CM | POA: Insufficient documentation

## 2021-09-09 DIAGNOSIS — K91840 Postprocedural hemorrhage and hematoma of a digestive system organ or structure following a digestive system procedure: Secondary | ICD-10-CM | POA: Diagnosis present

## 2021-09-09 DIAGNOSIS — Z452 Encounter for adjustment and management of vascular access device: Secondary | ICD-10-CM

## 2021-09-09 DIAGNOSIS — I700 Atherosclerosis of aorta: Secondary | ICD-10-CM | POA: Diagnosis present

## 2021-09-09 DIAGNOSIS — I2699 Other pulmonary embolism without acute cor pulmonale: Secondary | ICD-10-CM | POA: Diagnosis present

## 2021-09-09 DIAGNOSIS — D62 Acute posthemorrhagic anemia: Secondary | ICD-10-CM | POA: Diagnosis present

## 2021-09-09 DIAGNOSIS — K651 Peritoneal abscess: Secondary | ICD-10-CM | POA: Diagnosis present

## 2021-09-09 DIAGNOSIS — B9561 Methicillin susceptible Staphylococcus aureus infection as the cause of diseases classified elsewhere: Secondary | ICD-10-CM | POA: Diagnosis present

## 2021-09-09 DIAGNOSIS — R1310 Dysphagia, unspecified: Secondary | ICD-10-CM | POA: Diagnosis present

## 2021-09-09 DIAGNOSIS — I10 Essential (primary) hypertension: Secondary | ICD-10-CM | POA: Diagnosis present

## 2021-09-09 DIAGNOSIS — R52 Pain, unspecified: Secondary | ICD-10-CM

## 2021-09-09 DIAGNOSIS — R140 Abdominal distension (gaseous): Secondary | ICD-10-CM

## 2021-09-09 DIAGNOSIS — I4819 Other persistent atrial fibrillation: Secondary | ICD-10-CM | POA: Diagnosis present

## 2021-09-09 DIAGNOSIS — Z66 Do not resuscitate: Secondary | ICD-10-CM | POA: Diagnosis present

## 2021-09-09 DIAGNOSIS — Z87891 Personal history of nicotine dependence: Secondary | ICD-10-CM

## 2021-09-09 DIAGNOSIS — D638 Anemia in other chronic diseases classified elsewhere: Secondary | ICD-10-CM | POA: Diagnosis present

## 2021-09-09 DIAGNOSIS — D630 Anemia in neoplastic disease: Secondary | ICD-10-CM | POA: Diagnosis present

## 2021-09-09 DIAGNOSIS — J90 Pleural effusion, not elsewhere classified: Secondary | ICD-10-CM | POA: Diagnosis present

## 2021-09-09 DIAGNOSIS — Z888 Allergy status to other drugs, medicaments and biological substances status: Secondary | ICD-10-CM

## 2021-09-09 LAB — FERRITIN: Ferritin: 531 ng/mL — ABNORMAL HIGH (ref 24–336)

## 2021-09-09 LAB — POC OCCULT BLOOD, ED: Fecal Occult Bld: POSITIVE — AB

## 2021-09-09 LAB — IRON AND TIBC
Iron: 14 ug/dL — ABNORMAL LOW (ref 45–182)
Saturation Ratios: 11 % — ABNORMAL LOW (ref 17.9–39.5)
TIBC: 128 ug/dL — ABNORMAL LOW (ref 250–450)
UIBC: 114 ug/dL

## 2021-09-09 LAB — RETICULOCYTES
Immature Retic Fract: 25.4 % — ABNORMAL HIGH (ref 2.3–15.9)
RBC.: 2.4 MIL/uL — ABNORMAL LOW (ref 4.22–5.81)
Retic Count, Absolute: 50.6 10*3/uL (ref 19.0–186.0)
Retic Ct Pct: 2.1 % (ref 0.4–3.1)

## 2021-09-09 LAB — PROTIME-INR
INR: 1.2 (ref 0.8–1.2)
Prothrombin Time: 15 seconds (ref 11.4–15.2)

## 2021-09-09 LAB — LACTIC ACID, PLASMA: Lactic Acid, Venous: 1.8 mmol/L (ref 0.5–1.9)

## 2021-09-09 LAB — URINALYSIS, ROUTINE W REFLEX MICROSCOPIC
Bilirubin Urine: NEGATIVE
Glucose, UA: NEGATIVE mg/dL
Hgb urine dipstick: NEGATIVE
Ketones, ur: NEGATIVE mg/dL
Leukocytes,Ua: NEGATIVE
Nitrite: NEGATIVE
Protein, ur: NEGATIVE mg/dL
Specific Gravity, Urine: 1.012 (ref 1.005–1.030)
pH: 6 (ref 5.0–8.0)

## 2021-09-09 LAB — CBC WITH DIFFERENTIAL/PLATELET
Abs Immature Granulocytes: 0 10*3/uL (ref 0.00–0.07)
Basophils Absolute: 0 10*3/uL (ref 0.0–0.1)
Basophils Relative: 0 %
Eosinophils Absolute: 0 10*3/uL (ref 0.0–0.5)
Eosinophils Relative: 0 %
HCT: 24 % — ABNORMAL LOW (ref 39.0–52.0)
Hemoglobin: 7.4 g/dL — ABNORMAL LOW (ref 13.0–17.0)
Lymphocytes Relative: 10 %
Lymphs Abs: 1.1 10*3/uL (ref 0.7–4.0)
MCH: 30.2 pg (ref 26.0–34.0)
MCHC: 30.8 g/dL (ref 30.0–36.0)
MCV: 98 fL (ref 80.0–100.0)
Monocytes Absolute: 0.4 10*3/uL (ref 0.1–1.0)
Monocytes Relative: 4 %
Neutro Abs: 9 10*3/uL — ABNORMAL HIGH (ref 1.7–7.7)
Neutrophils Relative %: 86 %
Platelets: 244 10*3/uL (ref 150–400)
RBC: 2.45 MIL/uL — ABNORMAL LOW (ref 4.22–5.81)
RDW: 17 % — ABNORMAL HIGH (ref 11.5–15.5)
WBC: 10.5 10*3/uL (ref 4.0–10.5)
nRBC: 0 % (ref 0.0–0.2)

## 2021-09-09 LAB — COMPREHENSIVE METABOLIC PANEL
ALT: 13 U/L (ref 0–44)
AST: 14 U/L — ABNORMAL LOW (ref 15–41)
Albumin: 1.8 g/dL — ABNORMAL LOW (ref 3.5–5.0)
Alkaline Phosphatase: 83 U/L (ref 38–126)
Anion gap: 6 (ref 5–15)
BUN: 21 mg/dL (ref 8–23)
CO2: 25 mmol/L (ref 22–32)
Calcium: 8.3 mg/dL — ABNORMAL LOW (ref 8.9–10.3)
Chloride: 98 mmol/L (ref 98–111)
Creatinine, Ser: 0.85 mg/dL (ref 0.61–1.24)
GFR, Estimated: >60 mL/min (ref >60)
Glucose, Bld: 130 mg/dL — ABNORMAL HIGH (ref 70–99)
Potassium: 3.7 mmol/L (ref 3.5–5.1)
Sodium: 129 mmol/L — ABNORMAL LOW (ref 135–145)
Total Bilirubin: 0.3 mg/dL (ref 0.3–1.2)
Total Protein: 6.6 g/dL (ref 6.5–8.1)

## 2021-09-09 LAB — RESP PANEL BY RT-PCR (FLU A&B, COVID) ARPGX2
Influenza A by PCR: NEGATIVE
Influenza B by PCR: NEGATIVE
SARS Coronavirus 2 by RT PCR: NEGATIVE

## 2021-09-09 LAB — ABO/RH: ABO/RH(D): B POS

## 2021-09-09 LAB — FOLATE: Folate: 5.2 ng/mL — ABNORMAL LOW (ref >5.9)

## 2021-09-09 LAB — VITAMIN B12: Vitamin B-12: 667 pg/mL (ref 180–914)

## 2021-09-09 LAB — APTT: aPTT: 36 seconds (ref 24–36)

## 2021-09-09 LAB — PREPARE RBC (CROSSMATCH)

## 2021-09-09 MED ORDER — POLYETHYLENE GLYCOL 3350 17 G PO PACK
17.0000 g | PACK | Freq: Every day | ORAL | Status: DC | PRN
  Administered 2021-09-14 – 2021-09-17 (×3): 17 g via ORAL
  Filled 2021-09-09 (×3): qty 1

## 2021-09-09 MED ORDER — ACETAMINOPHEN 325 MG PO TABS
650.0000 mg | ORAL_TABLET | Freq: Four times a day (QID) | ORAL | Status: DC | PRN
  Administered 2021-09-13 – 2021-09-16 (×2): 650 mg via ORAL
  Filled 2021-09-09 (×3): qty 2

## 2021-09-09 MED ORDER — SODIUM CHLORIDE 0.9 % IV BOLUS
1000.0000 mL | Freq: Once | INTRAVENOUS | Status: AC
  Administered 2021-09-09: 1000 mL via INTRAVENOUS

## 2021-09-09 MED ORDER — SODIUM CHLORIDE 0.9 % IV SOLN: 1.0000 g | Freq: Once | INTRAVENOUS | Status: DC

## 2021-09-09 MED ORDER — GUAIFENESIN-DM 100-10 MG/5ML PO SYRP: 5.0000 mL | ORAL_SOLUTION | Freq: Four times a day (QID) | ORAL | Status: DC | PRN

## 2021-09-09 MED ORDER — ACETAMINOPHEN 325 MG PO TABS
650.0000 mg | ORAL_TABLET | Freq: Once | ORAL | Status: AC
  Administered 2021-09-09: 650 mg via ORAL
  Filled 2021-09-09: qty 2

## 2021-09-09 MED ORDER — PROSOURCE NO CARB PO LIQD
15.0000 mL | Freq: Two times a day (BID) | ORAL | Status: DC
  Filled 2021-09-09: qty 30

## 2021-09-09 MED ORDER — PANTOPRAZOLE SODIUM 40 MG PO TBEC
40.0000 mg | DELAYED_RELEASE_TABLET | Freq: Every day | ORAL | Status: DC
  Administered 2021-09-10 – 2021-09-17 (×6): 40 mg via ORAL
  Filled 2021-09-09 (×6): qty 1

## 2021-09-09 MED ORDER — DILTIAZEM HCL 60 MG PO TABS
120.0000 mg | ORAL_TABLET | Freq: Every day | ORAL | Status: DC
  Administered 2021-09-09 – 2021-09-17 (×8): 120 mg via ORAL
  Filled 2021-09-09 (×8): qty 2

## 2021-09-09 MED ORDER — SODIUM CHLORIDE 0.9 % IV SOLN
500.0000 mg | INTRAVENOUS | Status: DC
  Administered 2021-09-09: 500 mg via INTRAVENOUS
  Filled 2021-09-09 (×2): qty 500

## 2021-09-09 MED ORDER — SODIUM CHLORIDE 0.9 % IV SOLN: 1.0000 g | INTRAVENOUS | Status: DC

## 2021-09-09 MED ORDER — PROSOURCE PLUS PO LIQD
30.0000 mL | Freq: Two times a day (BID) | ORAL | Status: DC
  Administered 2021-09-09 – 2021-09-11 (×3): 30 mL via ORAL
  Filled 2021-09-09 (×7): qty 30

## 2021-09-09 MED ORDER — LACTATED RINGERS IV BOLUS
1000.0000 mL | Freq: Once | INTRAVENOUS | Status: AC
  Administered 2021-09-09: 1000 mL via INTRAVENOUS

## 2021-09-09 MED ORDER — ONDANSETRON HCL 4 MG/2ML IJ SOLN
4.0000 mg | Freq: Four times a day (QID) | INTRAMUSCULAR | Status: DC | PRN
  Administered 2021-09-13: 4 mg via INTRAVENOUS
  Filled 2021-09-09: qty 2

## 2021-09-09 MED ORDER — SODIUM CHLORIDE 0.9 % IV SOLN
1.0000 g | Freq: Once | INTRAVENOUS | Status: AC
  Administered 2021-09-09: 1 g via INTRAVENOUS
  Filled 2021-09-09: qty 10

## 2021-09-09 MED ORDER — FOLIC ACID 1 MG PO TABS
1.0000 mg | ORAL_TABLET | Freq: Every day | ORAL | Status: DC
  Administered 2021-09-09 – 2021-09-17 (×7): 1 mg via ORAL
  Filled 2021-09-09 (×7): qty 1

## 2021-09-09 MED ORDER — ONDANSETRON HCL 4 MG PO TABS: 4.0000 mg | ORAL_TABLET | Freq: Four times a day (QID) | ORAL | Status: DC | PRN

## 2021-09-09 MED ORDER — SODIUM CHLORIDE 0.9 % IV SOLN
10.0000 mL/h | Freq: Once | INTRAVENOUS | Status: AC
  Administered 2021-09-09: 10 mL/h via INTRAVENOUS

## 2021-09-09 MED ORDER — ACETAMINOPHEN 650 MG RE SUPP: 650.0000 mg | Freq: Four times a day (QID) | RECTAL | Status: DC | PRN

## 2021-09-09 MED ORDER — SALINE SPRAY 0.65 % NA SOLN
1.0000 | Freq: Once | NASAL | Status: DC
  Filled 2021-09-09: qty 44

## 2021-09-09 MED ORDER — DEXTROMETHORPHAN-GUAIFENESIN 5-100 MG/5ML PO LIQD: 5.0000 mL | Freq: Four times a day (QID) | ORAL | Status: DC | PRN

## 2021-09-09 MED ORDER — SODIUM CHLORIDE 0.9 % IV SOLN
INTRAVENOUS | Status: DC
Start: 2021-09-09 — End: 2021-09-12

## 2021-09-09 NOTE — H&P (Signed)
History and Physical    Leon Lyons [id number]:[id number] DOB: 01-29-1933 DOA: 09/09/2021  PCP: [name], MD   Patient coming from: Home.  I have personally briefly reviewed patient's old medical records in [place]  Chief Complaint: Generalized weakness.  HPI: Leon Lyons is a 87 y.o. adult with medical history significant of lung cancer, recently diagnosed with atrial fibrillation and pulmonary embolism after being surgically intervened for a volvulus who is coming to the emergency department due to generalized weakness in the setting of symptomatic anemia.  He has been dyspneic on exertion at times.  His appetite has been decreased but denied fever, chills, sore throat, rhinorrhea, chest pain, diaphoresis, PND, orthopnea or pitting edema of the lower extremities.  Abdominal pain, nausea, emesis, diarrhea, melena or hematochezia.  No dysuria, flank pain, frequency or hematuria.  Polyuria, polydipsia, polyphagia or blurred vision.  He has been sleeping well on most nights.  ED Course: Initial vital signs were temperature 100 F, pulse 111, respirations 22, BP 119/72 mmHg O2 sat 96% on room air.  In addition to PRBC unit, the patient received 2000 mL of LR bolus and 650 mg of acetaminophen.  I added LR 1000 mL bolus.  Lab work: Urinalysis was normal.CBC showed a white count of 10.5, hemoglobin of 7.4 g/dL and platelets 244.  PT/INR/PTT within normal limits.  Fecal occult blood was positive.  CMP showed a sodium of 129 mmol/L the rest of the electrolytes were normal.  Renal function was normal.  LFTs showed an albumin level of 1.8 g/dL the rest of the LFTs were unremarkable.  Imaging: One-view portable chest radiograph shows a moderate sized left effusion.  Cannot exclude left lower lobe atelectasis or infiltrate.  Please see image and full radiology report for further details.  Review of Systems: As per HPI otherwise all other systems reviewed and are negative.  Past Medical  History:  Diagnosis Date   History of radiation therapy 03/24/2021   left lung   02/11/2021-03/24/2021  Dr Gery Pray   Lung cancer [facility])    Tobacco use    Past Surgical History:  Procedure Laterality Date   APPENDECTOMY     BRONCHIAL BIOPSY  01/07/2021   Procedure: BRONCHIAL BIOPSIES;  Surgeon: Garner Nash, DO;  Location: [place] [facility];  Service: Pulmonary;;   BRONCHIAL BRUSHINGS  01/07/2021   Procedure: BRONCHIAL BRUSHINGS;  Surgeon: Garner Nash, DO;  Location: [place] [facility];  Service: Pulmonary;;   BRONCHIAL NEEDLE ASPIRATION BIOPSY  01/07/2021   Procedure: BRONCHIAL NEEDLE ASPIRATION BIOPSIES;  Surgeon: Garner Nash, DO;  Location: [place];  Service: Pulmonary;;   BRONCHIAL WASHINGS  01/07/2021   Procedure: BRONCHIAL WASHINGS;  Surgeon: Garner Nash, DO;  Location: [place] [facility]OSCOPY;  Service: Pulmonary;;   IRRIGATION AND DEBRIDEMENT SEBACEOUS CYST     VIDEO BRONCHOSCOPY WITH ENDOBRONCHIAL ULTRASOUND Bilateral 01/07/2021   Procedure: VIDEO BRONCHOSCOPY WITH ENDOBRONCHIAL ULTRASOUND;  Surgeon: Garner Nash, DO;  Location: [place];  Service: Pulmonary;  Laterality: Bilateral;  possible need for radial ultrasound and fluoro    Social History  reports that he quit smoking about 60 years ago. His smoking use included cigarettes. He has never used smokeless tobacco. No history on file for alcohol use and drug use.  Allergies  Allergen Reactions   Diclofenac Sodium Other (See Comments)    Kidneys were affected   Family History  Problem Relation Age of Onset   Hypertension Other    Prior to Admission medications  Medication Sig Start Date End Date Taking? Authorizing Provider  acetaminophen (TYLENOL) 500 MG tablet Take 500-1,000 mg by mouth every 8 (eight) hours as needed (for pain).   Yes [provider]  Amino Acids-Protein Hydrolys (FEEDING SUPPLEMENT, PRO-STAT SUGAR FREE 64,) LIQD Take 15 mLs by mouth in the morning and at bedtime.   Yes [provider]  Cholecalciferol (VITAMIN D3 SUPER STRENGTH) 50 MCG (2000 UT) CAPS Take 4,000 Units by mouth daily.   Yes [provider]  Dextromethorphan-guaiFENesin 5-100 MG/5ML LIQD Take 5 mLs by mouth every 6 (six) hours as needed (for cough or congestion).   Yes [provider]  diltiazem (CARDIZEM) 120 MG tablet Take 120 mg by mouth daily.   Yes [provider]  enoxaparin (LOVENOX) 80 MG/0.8ML injection Inject 70 mg into the skin 2 (two) times daily. 09/02/21  Yes [provider]  VOLTAREN 1 % GEL Apply 2 g topically See admin instructions. Apply 2 grams of gel to the shoulders, legs, or other painful sites 2 times a day as needed for pain   Yes [provider]  Ascorbic Acid (VITAMIN C) 1000 MG tablet Take 1,000 mg by mouth once a week. Patient not taking: Reported on 03/08/2021    [provider]  gabapentin (NEURONTIN) 300 MG capsule Take 300 mg by mouth at bedtime. Patient not taking: Reported on 09/09/2021    [provider]  lidocaine (XYLOCAINE) 2 % solution Mix 1 part 2% viscous lidocaine,1 part H2O. Swallow 56mL of diluted mixture, 35min before meals, up to QID. Use PRN soreness Patient not taking: Reported on 09/09/2021 03/08/21   Eppie Gibson, MD  Magnesium 400 MG CAPS Take 400 mg by mouth once a week. Patient not taking: Reported on 09/09/2021    [provider]  metoprolol succinate (TOPROL-XL) 25 MG 24 hr tablet Take 25 mg by mouth See admin instructions. Take 25 mg by mouth at bedtime and hold if Systolic number is <620 or heart rate is <65 Patient not taking: Reported on 09/09/2021    [provider]  Multiple Vitamin (MULTIVITAMIN WITH MINERALS) TABS tablet Take 1 tablet by mouth once a week. Patient not taking: Reported on 09/09/2021    [provider]  Omega-3 Fatty Acids (FISH OIL ULTRA) 1400 MG CAPS Take 1,400 mg by mouth once a week. Patient not taking: Reported on 09/09/2021     [provider]  polyethylene glycol (MIRALAX / GLYCOLAX) 17 g packet Take 17 g by mouth daily. Patient taking differently: Take 17 g by mouth daily as needed for mild constipation (mix and drink). 02/04/21   Ripley Fraise, MD  prochlorperazine (COMPAZINE) 10 MG tablet Take 1 tablet (10 mg total) by mouth every 6 (six) hours as needed for nausea or vomiting. Patient not taking: Reported on 09/09/2021 01/12/21   [name], MD  sucralfate (CARAFATE) 1 g tablet Dissolve 1 tablet in 28mL H2O and swallow up to QID, PRN sore throat. Patient not taking: Reported on 09/09/2021 03/08/21   Eppie Gibson, MD  traMADol Veatrice Bourbon) 50 MG tablet Take by mouth every 6 (six) hours as needed. Patient not taking: Reported on 09/09/2021    [provider]  vitamin E 180 MG (400 UNITS) capsule Take 400 Units by mouth once a week. Patient not taking: Reported on 09/09/2021    [provider]   Physical Exam: Vitals:   09/09/21 1445 09/09/21 1500 09/09/21 1700 09/09/21 1830  BP: (!) 104/58 (!) 100/49 102/61 Marland Kitchen)  111/55  Pulse: 96 95 (!) 111 (!) 110  Resp: (!) 24 19 18 20   Temp:    97.6 F (36.4 C)  TempSrc:    Oral  SpO2: 98% 97% 97% 97%  Weight:      Height:       Constitutional: NAD, calm, comfortable Eyes: PERRL, lids and conjunctivae were pale. ENMT: Mucous membranes are moist. Posterior pharynx clear of any exudate or lesions. Neck: normal, supple, no masses, no thyromegaly Respiratory: clear to auscultation bilaterally, no wheezing, no crackles. Normal respiratory effort. No accessory muscle use.  Cardiovascular: Irregularly irregular rhythm in the 90s and low 100s, no murmurs / rubs / gallops. No extremity edema. 2+ pedal pulses. No carotid bruits.  Abdomen: No distention.  Positive surgical scars.  Soft, no tenderness, no masses palpated. No hepatosplenomegaly. Bowel sounds positive.  Musculoskeletal: no clubbing / cyanosis. Good ROM, no contractures. Normal muscle tone.   Skin: no acute rashes, lesions, ulcers on very limited examination. Neurologic: CN 2-12 grossly intact. Sensation intact, DTR normal. Strength 5/5 in all 4.  Psychiatric: Normal judgment and insight. Alert and oriented x 3. Normal mood.   Labs on Admission: I have personally reviewed following labs and imaging studies  CBC: Recent Labs  Lab 09/09/21 1248  WBC 10.5  NEUTROABS 9.0*  HGB 7.4*  HCT 24.0*  MCV 98.0  PLT 992    Basic Metabolic Panel: Recent Labs  Lab 09/09/21 1248  NA 129*  K 3.7  CL 98  CO2 25  GLUCOSE 130*  BUN 21  CREATININE 0.85  CALCIUM 8.3*    GFR: Estimated Creatinine Clearance (by C-G formula based on SCr of 0.85 mg/dL) Male: 47.9 mL/min Male: 60.4 mL/min  Liver Function Tests: Recent Labs  Lab 09/09/21 1248  AST 14*  ALT 13  ALKPHOS 83  BILITOT 0.3  PROT 6.6  ALBUMIN 1.8*    Urine analysis: No results found for: COLORURINE, APPEARANCEUR, LABSPEC, [place], GLUCOSEU, HGBUR, BILIRUBINUR, KETONESUR, PROTEINUR, UROBILINOGEN, NITRITE, LEUKOCYTESUR  Radiological Exams on Admission: DG Chest Port 1 View  Result Date: 09/09/2021 CLINICAL DATA:  Concern for sepsis EXAM: PORTABLE CHEST 1 VIEW COMPARISON:  08/19/2019 FINDINGS: normal cardiac silhouette. Moderate LEFT effusion is new from prior. LEFT lung base not well evaluated. RIGHT lung clear. IMPRESSION: New moderate size LEFT effusion. Cannot exclude LEFT lower lobe atelectasis or infiltrate. Electronically Signed   By: Suzy Bouchard M.D.   On: 09/09/2021 13:43    EKG: Independently reviewed.  Vent. rate 120 BPM PR interval * ms QRS duration 99 ms QT/QTcB 312/441 ms P-R-T axes * -6 72 Atrial fibrillation Ventricular premature complex RSR' in V1 or V2, probably normal variant Nonspecific T abnormalities, lateral leads  Assessment/Plan Principal Problem:   Symptomatic anemia Due to: Anemia chronic disease/blood loss Observation/PCU. Awaiting PRBC transfusion. Monitor  hematocrit and hemoglobin. Start Protonix. GI consult appreciated.  Active Problems:    Pleural effusion on left Hypoalbuminemic, but Will continue pneumonia ABX but coverage. Check procalcitonin level.    Hyponatremia Recent poor intake?  Malignancy? Continue normal IV fluids. Follow-up sodium level. Further work-up depending on trend.    Persistent atrial fibrillation (HCC) Continue Cardizem 120 mg p.o. daily.    Pulmonary embolism ([place]) Will check with GI when [facility] can be resumed.   Protein-calorie malnutrition, severe (HCC) Protein supplementation. Consider nutritional services evaluation.    Non-small cell carcinoma of left lung, stage 3 ([place]) Follow-up with oncology as scheduled.   DVT prophylaxis: SCDs. Code Status:  Full code. Family Communication:   Disposition Plan:   Patient is from:  Home.  Anticipated DC to:  Home.  Anticipated DC date:  09/11/2021.  Anticipated DC barriers: Clinical status.  Consults called:  [facility] Lizbeth Bark, MD) Admission status:  Observation/PCU.  Severity of Illness:  High severity after presenting to the emergency department with generalized weakness in the setting of symptomatic anemia and left pleural effusion.  Reubin Milan MD [facility]  How to contact the [facility] Attending or Consulting provider [place] or covering provider during after hours [place], for this patient?   Check the care team in [facility] and look for a) attending/consulting TR[facility] provider listed and b) the [facility] team listed Log into [email or web address] and use [place]'s universal password to access. If you do not have the password, please contact the hospital operator. Locate the [facility] provider you are looking for under [facility] and page to a number that you can be directly reached. If you still have difficulty reaching the provider, please page the [facility] (Director on Call) for the Hospitalists listed on amion for assistance.  09/09/2021, 6:49 PM    This document was prepared using Dragon voice recognition software and may contain some unintended transcription errors.

## 2021-09-09 NOTE — Telephone Encounter (Signed)
This nurse received a message from [place] at [facility] stating that she wants to inform the MD that she went to do a visit with the patient and he had complaints of increased weakness, decreased appetite, Low blood pressure of 92/44, 6lb weight loss since Monday, Increased pallor, No bowel movement since Monday, irregular heart rate of 115.  Patient was transported to [facility] ED for evaluation due to A-fib and possible Pulmonary Embolism.  No further concerns noted.

## 2021-09-09 NOTE — Consult Note (Addendum)
Referring Provider: [facility] Primary Care Physician:  [name], MD Primary Gastroenterologist:  Althia Forts  Reason for Consultation:  Positive FOBT, anemia  HPI: Leon Lyons is a 87 y.o. adult medical history significant for non-smallcell carcinoma of left lung stage 3 s/p radiation, PE and afib anticoagulated on Lovenox (last dose this morning) presents for anemia  Patient diagnosed with NSCLC Aprill 2022, currently undergoing chemotherapy. 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 November, new diagnosis of PE and a fib, put on Lovenox. No previous GI history prior to this year. Last dose of chemo was 2 months ago.  Patient has been progressively weak since November admission. States his home health nurse sent him to ED due to hypotension. He states he hasn't been drinking as much water as he should. He denies shortness of breath, chest pain, abdominal pain, nausea, vomiting. Denies melena/hematochezia. Denies family history of colon cancer or GI malignancies. States he was perfectly healthy up until early this year. Was previously on no medications. History of alcohol and tobacco use seldomly about 60 years ago. Denies NSAID/ASA use.   States he had a colonoscopy about 20 years ago, cannot remember what it showed or when he was supposed to come back for repeat. Married to wife for 30 years. Recently moved from [place].  Past Medical History:  Diagnosis Date   History of radiation therapy 03/24/2021   left lung   02/11/2021-03/24/2021  Dr Gery Pray   Lung cancer [facility])    Tobacco use     Past Surgical History:  Procedure Laterality Date   APPENDECTOMY     BRONCHIAL BIOPSY  01/07/2021   Procedure: BRONCHIAL BIOPSIES;  Surgeon: Garner Nash, DO;  Location: [place] [facility]COPY;  Service: Pulmonary;;   BRONCHIAL BRUSHINGS  01/07/2021   Procedure: BRONCHIAL BRUSHINGS;  Surgeon: Garner Nash, DO;  Location: [place] [facility]OSCOPY;  Service:  Pulmonary;;   BRONCHIAL NEEDLE ASPIRATION BIOPSY  01/07/2021   Procedure: BRONCHIAL NEEDLE ASPIRATION BIOPSIES;  Surgeon: Garner Nash, DO;  Location: [place];  Service: Pulmonary;;   BRONCHIAL WASHINGS  01/07/2021   Procedure: BRONCHIAL WASHINGS;  Surgeon: Garner Nash, DO;  Location: [place] ENDOSCOPY;  Service: Pulmonary;;   IRRIGATION AND DEBRIDEMENT SEBACEOUS CYST     VIDEO BRONCHOSCOPY WITH ENDOBRONCHIAL ULTRASOUND Bilateral 01/07/2021   Procedure: VIDEO BRONCHOSCOPY WITH ENDOBRONCHIAL ULTRASOUND;  Surgeon: Garner Nash, DO;  Location: [place];  Service: Pulmonary;  Laterality: Bilateral;  possible need for radial ultrasound and fluoro     Prior to Admission medications   Medication Sig Start Date End Date Taking? Authorizing Provider  acetaminophen (TYLENOL) 500 MG tablet Take 500-1,000 mg by mouth every 8 (eight) hours as needed (for pain).   Yes [provider]  Cholecalciferol (VITAMIN D3 SUPER STRENGTH) 50 MCG (2000 UT) CAPS Take 4,000 Units by mouth daily.   Yes [provider]  Dextromethorphan-guaiFENesin 5-100 MG/5ML LIQD Take by mouth daily as needed.   Yes [provider]  Ascorbic Acid (VITAMIN C) 1000 MG tablet Take 1,000 mg by mouth once a week. Patient not taking: Reported on 03/08/2021    [provider]  gabapentin (NEURONTIN) 300 MG capsule Take 300 mg by mouth at bedtime. Patient not taking: Reported on 09/09/2021    [provider]  lidocaine (XYLOCAINE) 2 % solution Mix 1 part 2% viscous lidocaine,1 part H2O. Swallow 23mL of diluted mixture, 57min before meals, up to QID. Use PRN soreness Patient not taking: Reported on  09/09/2021 03/08/21   Leon Gibson, MD  Magnesium 400 MG CAPS Take 400 mg by mouth once a week. Patient not taking: Reported on 09/09/2021    [provider]  Multiple Vitamin (MULTIVITAMIN WITH MINERALS) TABS tablet Take 1 tablet by mouth once a week. Patient not taking: Reported on  09/09/2021    [provider]  Omega-3 Fatty Acids (FISH OIL ULTRA) 1400 MG CAPS Take 1,400 mg by mouth once a week. Patient not taking: Reported on 09/09/2021    [provider]  polyethylene glycol (MIRALAX / GLYCOLAX) 17 g packet Take 17 g by mouth daily. 02/04/21   Leon Fraise, MD  prochlorperazine (COMPAZINE) 10 MG tablet Take 1 tablet (10 mg total) by mouth every 6 (six) hours as needed for nausea or vomiting. Patient not taking: Reported on 09/09/2021 01/12/21   Leon Bears, MD  sucralfate (CARAFATE) 1 g tablet Dissolve 1 tablet in 65mL H2O and swallow up to QID, PRN sore throat. Patient not taking: Reported on 09/09/2021 03/08/21   Leon Gibson, MD  traMADol Leon Lyons) 50 MG tablet Take by mouth every 6 (six) hours as needed. Patient not taking: Reported on 09/09/2021    [provider]  vitamin E 180 MG (400 UNITS) capsule Take 400 Units by mouth once a week. Patient not taking: Reported on 09/09/2021    [provider]    Scheduled Meds: Continuous Infusions:  sodium chloride     azithromycin     cefTRIAXone (ROCEPHIN)  IV 1 g (09/09/21 1502)   PRN Meds:.  Allergies as of 09/09/2021   (No Known Allergies)    No family history on file.  Social History   Socioeconomic History   Marital status: Married    Spouse name: Not on file   Number of children: Not on file   Years of education: Not on file   Highest education level: Not on file  Occupational History   Not on file  Tobacco Use   Smoking status: Former    Years: 5.00    Types: Cigarettes    Quit date: 61    Years since quitting: 60.9   Smokeless tobacco: Never   Tobacco comments:    light/occasional smoker back in early 1960's  Vaping Use   Vaping Use: Never used  Substance and Sexual Activity   Alcohol use: Not on file   Drug use: Not on file   Sexual activity: Not on file  Other Topics Concern   Not on file  Social History Narrative   Not on file   Social  Determinants of Health   Financial Resource Strain: Low Risk    Difficulty of Paying Living Expenses: Not hard at all  Food Insecurity: No Food Insecurity   Worried About Charity fundraiser in the Last Year: Never true   [place] in the Last Year: Never true  Transportation Needs: No Transportation Needs   Lack of Transportation (Medical): No   Lack of Transportation (Non-Medical): No  Physical Activity: Inactive   Days of Exercise per Week: 0 days   Minutes of Exercise per Session: 0 min  Stress: No Stress Concern Present   Feeling of Stress : Only a little  Social Connections: Not on file  Intimate Partner Violence: Not At Risk   Fear of Current or Ex-Partner: No   Emotionally Abused: No   Physically Abused: No   Sexually Abused: No    Review of Systems: Review of Systems  Constitutional:  Negative for chills and fever.  HENT:  Negative for ear pain and tinnitus.   Eyes:  Negative for blurred vision and double vision.  Respiratory:  Negative for cough, hemoptysis and shortness of breath.   Cardiovascular:  Negative for chest pain and palpitations.  Gastrointestinal:  Negative for abdominal pain, blood in stool, constipation, diarrhea, heartburn, melena, nausea and vomiting.  Genitourinary:  Negative for dysuria and urgency.  Musculoskeletal:  Negative for myalgias and neck pain.  Skin:  Negative for itching and rash.  Neurological:  Negative for seizures and loss of consciousness.  Psychiatric/Behavioral:  Negative for substance abuse. The patient is nervous/anxious.     Physical Exam:Physical Exam Constitutional:      Appearance: Normal appearance.     Comments: Wife at bedside  HENT:     Head: Normocephalic and atraumatic.     Nose: Nose normal. No congestion.     Mouth/Throat:     Mouth: Mucous membranes are moist.     Pharynx: Oropharynx is clear.  Eyes:     Extraocular Movements: Extraocular movements intact.     Comments: Conjunctival pallor   Cardiovascular:     Rate and Rhythm: Normal rate. Rhythm irregular.  Pulmonary:     Effort: Pulmonary effort is normal. No respiratory distress.  Abdominal:     General: Abdomen is flat. Bowel sounds are normal. There is no distension.     Palpations: Abdomen is soft. There is no mass.     Tenderness: There is no abdominal tenderness. There is no guarding or rebound.     Hernia: No hernia is present.     Comments: Midline surgical scar, some scar tissue palpated  Musculoskeletal:        General: No swelling. Normal range of motion.     Cervical back: Normal range of motion and neck supple.  Skin:    General: Skin is warm.     Coloration: Skin is not jaundiced.  Neurological:     General: No focal deficit present.     Mental Status: He is alert and oriented to person, place, and time.  Psychiatric:        Mood and Affect: Mood normal.        Behavior: Behavior normal.        Thought Content: Thought content normal.        Judgment: Judgment normal.    Vital signs: Vitals:   09/09/21 1445 09/09/21 1500  BP: (!) 104/58 (!) 100/49  Pulse: 96 95  Resp: (!) 24 19  Temp:    SpO2: 98% 97%        GI:  Lab Results: Recent Labs    09/09/21 1248  WBC 10.5  HGB 7.4*  HCT 24.0*  PLT 244   BMET Recent Labs    09/09/21 1248  NA 129*  K 3.7  CL 98  CO2 25  GLUCOSE 130*  BUN 21  CREATININE 0.85  CALCIUM 8.3*   LFT Recent Labs    09/09/21 1248  PROT 6.6  ALBUMIN 1.8*  AST 14*  ALT 13  ALKPHOS 83  BILITOT 0.3   PT/INR Recent Labs    09/09/21 1248  LABPROT 15.0  INR 1.2     Studies/Results: DG Chest Port 1 View  Result Date: 09/09/2021 CLINICAL DATA:  Concern for sepsis EXAM: PORTABLE CHEST 1 VIEW COMPARISON:  08/19/2019 FINDINGS: normal cardiac silhouette. Moderate LEFT effusion is new from prior. LEFT lung base not well evaluated. RIGHT lung clear. IMPRESSION: New  moderate size LEFT effusion. Cannot exclude LEFT lower lobe atelectasis or infiltrate.  Electronically Signed   By: Suzy Bouchard M.D.   On: 09/09/2021 13:43    Impression: Anemia - hgb 7.4 (9.1 06/2021), appears baseline is 8-9 - positive fecal occult - BUN 21, Cr  0.85  NSCLC  Afib - last dose of Lovenox this morning  Plan: Recent volvulus surgery in October. Appears baseline hgb for patient is 8-9. No GI symptoms. Positive fobt, likely from possible leak from anastomosis from recent surgery. No indication for procedures at this time with no obvious bleeding, no elevation in BUN. Anemia possibly chronic disease vs acute blood loss with normal MCV. Will obtain anemia labs.  Continue daily CBC and transfuse as needed to maintain HGB > 7  Continue supportive care.  [facility] will sign off. Please contact us if we can be of any further assistance during this hospital stay.    LOS: 0 days   Jasmeet Manton Radford Pax  PA-C 09/09/2021, 3:27 PM  Contact #  [phone]

## 2021-09-09 NOTE — ED Provider Notes (Signed)
[facility] DEPT Provider Note   CSN: [id number] Arrival date & time: 09/09/21  1219     History Chief Complaint  Patient presents with   Weakness    Leon Lyons is a 87 y.o. adult.  Pt presents to the ED today with sob and weakness.  Pt has a complicated past medical hx.  He was admitted to [facility] in October for a cecal volvulus which required surgery. In November, he had a new diagnosis of  PE and afib.  He was put on Lovenox.  His wife said he had a blood transfusion at [facility] in November.  Pt has been getting progressively weak since that admission.  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   This was diagnosed in April of 2022.  Pt currently undergoing immunotherapy.        Past Medical History:  Diagnosis Date   History of radiation therapy 03/24/2021   left lung   02/11/2021-03/24/2021  Dr Gery Pray   Lung cancer [facility])    Tobacco use     Patient Active Problem List   Diagnosis Date Noted   [place]ena 09/09/2021   Encounter for antineoplastic immunotherapy 04/19/2021   Neutropenia ([place]) 03/14/2021   Non-small cell carcinoma of left lung, stage 3 ([place]) 01/12/2021   Encounter for antineoplastic chemotherapy 01/12/2021   Lung mass 12/29/2020    Past Surgical History:  Procedure Laterality Date   APPENDECTOMY     BRONCHIAL BIOPSY  01/07/2021   Procedure: BRONCHIAL BIOPSIES;  Surgeon: Garner Nash, DO;  Location: [facility];  Service: Pulmonary;;   BRONCHIAL BRUSHINGS  01/07/2021   Procedure: BRONCHIAL BRUSHINGS;  Surgeon: Garner Nash, DO;  Location: [place];  Service: Pulmonary;;   BRONCHIAL NEEDLE ASPIRATION BIOPSY  01/07/2021   Procedure: BRONCHIAL NEEDLE ASPIRATION BIOPSIES;  Surgeon: Garner Nash, DO;  Location: [place];  Service: Pulmonary;;   BRONCHIAL WASHINGS  01/07/2021   Procedure: BRONCHIAL WASHINGS;  Surgeon: Garner Nash, DO;  Location: [place];  Service: Pulmonary;;   IRRIGATION AND DEBRIDEMENT SEBACEOUS CYST     VIDEO BRONCHOSCOPY WITH ENDOBRONCHIAL ULTRASOUND Bilateral 01/07/2021   Procedure: VIDEO BRONCHOSCOPY WITH ENDOBRONCHIAL ULTRASOUND;  Surgeon: Garner Nash, DO;  Location: [place];  Service: Pulmonary;  Laterality: Bilateral;  possible need for radial ultrasound and fluoro      OB History   No obstetric history on file.     No family history on file.  Social History   Tobacco Use   Smoking status: Former    Years: 5.00    Types: Cigarettes    Quit date: 1962    Years since quitting: 60.9   Smokeless tobacco: Never   Tobacco comments:    light/occasional smoker back in early 1960's  Vaping Use   Vaping Use: Never used    Home Medications Prior to Admission medications   Medication Sig Start Date End Date Taking? Authorizing Provider  acetaminophen (TYLENOL) 500 MG tablet Take 500-1,000 mg by mouth every 8 (eight) hours as needed (for pain).   Yes [provider]  Cholecalciferol (VITAMIN D3 SUPER STRENGTH) 50 MCG (2000 UT) CAPS Take 4,000 Units by mouth daily.   Yes [provider]  Dextromethorphan-guaiFENesin 5-100 MG/5ML LIQD Take by mouth daily as needed.   Yes [provider]  Ascorbic Acid (VITAMIN C) 1000 MG tablet Take 1,000 mg by mouth once a  week. Patient not taking: Reported on 03/08/2021    [provider]  gabapentin (NEURONTIN) 300 MG capsule Take 300 mg by mouth at bedtime. Patient not taking: Reported on 09/09/2021    [provider]  lidocaine (XYLOCAINE) 2 % solution Mix 1 part 2% viscous lidocaine,1 part H2O. Swallow 26mL of diluted mixture, 33min before meals, up to QID. Use PRN soreness Patient not taking: Reported on 09/09/2021 03/08/21   Eppie Gibson, MD  Magnesium 400 MG CAPS Take 400 mg by mouth once a week. Patient not taking: Reported on 09/09/2021    [provider]  Multiple Vitamin  (MULTIVITAMIN WITH MINERALS) TABS tablet Take 1 tablet by mouth once a week. Patient not taking: Reported on 09/09/2021    [provider]  Omega-3 Fatty Acids (FISH OIL ULTRA) 1400 MG CAPS Take 1,400 mg by mouth once a week. Patient not taking: Reported on 09/09/2021    [provider]  polyethylene glycol (MIRALAX / GLYCOLAX) 17 g packet Take 17 g by mouth daily. 02/04/21   Ripley Fraise, MD  prochlorperazine (COMPAZINE) 10 MG tablet Take 1 tablet (10 mg total) by mouth every 6 (six) hours as needed for nausea or vomiting. Patient not taking: Reported on 09/09/2021 01/12/21   [name], MD  sucralfate (CARAFATE) 1 g tablet Dissolve 1 tablet in 47mL H2O and swallow up to QID, PRN sore throat. Patient not taking: Reported on 09/09/2021 03/08/21   Eppie Gibson, MD  traMADol Veatrice Bourbon) 50 MG tablet Take by mouth every 6 (six) hours as needed. Patient not taking: Reported on 09/09/2021    [provider]  vitamin E 180 MG (400 UNITS) capsule Take 400 Units by mouth once a week. Patient not taking: Reported on 09/09/2021    [provider]    Allergies    Patient has no known allergies.  Review of Systems   Review of Systems  Respiratory:  Positive for shortness of breath.   Neurological:  Negative for weakness.  All other systems reviewed and are negative.  Physical Exam Updated Vital Signs BP (!) 100/49   Pulse 95   Temp 100 F (37.8 C) (Rectal)   Resp 19   Ht 5\' 8"  (1.727 m)   Wt 76 kg   SpO2 97%   BMI 25.47 kg/m   Physical Exam Vitals and nursing note reviewed.  Constitutional:      Appearance: He is ill-appearing.  HENT:     Head: Normocephalic and atraumatic.     Right Ear: External ear normal.     Left Ear: External ear normal.     Nose: Nose normal.     Mouth/Throat:     Mouth: Mucous membranes are moist.     Pharynx: Oropharynx is clear.  Eyes:     Extraocular Movements: Extraocular movements intact.     Conjunctiva/sclera:  Conjunctivae normal.     Pupils: Pupils are equal, round, and reactive to light.  Cardiovascular:     Rate and Rhythm: Tachycardia present. Rhythm irregular.     Pulses: Normal pulses.     Heart sounds: Normal heart sounds.  Pulmonary:     Effort: Pulmonary effort is normal.     Breath sounds: Normal breath sounds.  Abdominal:     General: Abdomen is flat. Bowel sounds are normal.     Palpations: Abdomen is soft.  Genitourinary:    Rectum: Guaiac result positive.  Musculoskeletal:        General: Normal range of motion.  Cervical back: Normal range of motion and neck supple.  Skin:    General: Skin is warm.     Capillary Refill: Capillary refill takes less than 2 seconds.  Neurological:     General: No focal deficit present.     Mental Status: He is alert and oriented to person, place, and time.  Psychiatric:        Mood and Affect: Mood normal.        Behavior: Behavior normal.    ED Results / Procedures / Treatments   Labs (all labs ordered are listed, but only abnormal results are displayed) Labs Reviewed  COMPREHENSIVE METABOLIC PANEL - Abnormal; Notable for the following components:      Result Value   Sodium 129 (*)    Glucose, Bld 130 (*)    Calcium 8.3 (*)    Albumin 1.8 (*)    AST 14 (*)    All other components within normal limits  CBC WITH DIFFERENTIAL/PLATELET - Abnormal; Notable for the following components:   RBC 2.45 (*)    Hemoglobin 7.4 (*)    HCT 24.0 (*)    RDW 17.0 (*)    Neutro Abs 9.0 (*)    All other components within normal limits  POC OCCULT BLOOD, ED - Abnormal; Notable for the following components:   Fecal Occult Bld POSITIVE (*)    All other components within normal limits  RESP PANEL BY RT-PCR (FLU A&B, COVID) ARPGX2  CULTURE, BLOOD (ROUTINE X 2)  CULTURE, BLOOD (ROUTINE X 2)  URINE CULTURE  LACTIC ACID, PLASMA  PROTIME-INR  APTT  URINALYSIS, ROUTINE W REFLEX MICROSCOPIC  PREPARE RBC (CROSSMATCH)  TYPE AND SCREEN     EKG None  Radiology DG Chest Port 1 View  Result Date: 09/09/2021 CLINICAL DATA:  Concern for sepsis EXAM: PORTABLE CHEST 1 VIEW COMPARISON:  08/19/2019 FINDINGS: normal cardiac silhouette. Moderate LEFT effusion is new from prior. LEFT lung base not well evaluated. RIGHT lung clear. IMPRESSION: New moderate size LEFT effusion. Cannot exclude LEFT lower lobe atelectasis or infiltrate. Electronically Signed   By: Suzy Bouchard M.D.   On: 09/09/2021 13:43    Procedures Procedures   Medications Ordered in ED Medications  0.9 %  sodium chloride infusion (has no administration in time range)  cefTRIAXone (ROCEPHIN) 1 g in sodium chloride 0.9 % 100 mL IVPB (1 g Intravenous New Bag/Given 09/09/21 1502)  azithromycin (ZITHROMAX) 500 mg in sodium chloride 0.9 % 250 mL IVPB (has no administration in time range)  sodium chloride 0.9 % bolus 1,000 mL (0 mLs Intravenous Stopped 09/09/21 1430)  acetaminophen (TYLENOL) tablet 650 mg (650 mg Oral Given 09/09/21 1341)  sodium chloride 0.9 % bolus 1,000 mL (1,000 mLs Intravenous New Bag/Given 09/09/21 1503)    ED Course  I have reviewed the triage vital signs and the nursing notes.  Pertinent labs & imaging results that were available during my care of the patient were reviewed by me and considered in my medical decision making (see chart for details).    MDM Rules/Calculators/A&P                           Pt given IVFs.  He does have a low grade temp.  Pt has a left pleural effusion with possible pneumonia.  Pt given rocephin/zithromax.  Covid/flu neg.  Hgb is down to 7.4.  He is symptomatic from his anemia, so he will be transfused 1 unit prbcs.  Pt is + from below.  Pt d/w Dr. Watt Climes [facility]) who will see pt in consult. Pt is on Lovenox which will need to be held.  Pt's HR is elevated upon arrival, but has come down with IVFs and tylenol.  Pt d/w Dr. Olevia Bowens ([facility]) for admission.  CRITICAL CARE Performed by: Isla Pence   Total  critical care time: 30 minutes  Critical care time was exclusive of separately billable procedures and treating other patients.  Critical care was necessary to treat or prevent imminent or life-threatening deterioration.  Critical care was time spent personally by me on the following activities: development of treatment plan with patient and/or surrogate as well as nursing, discussions with consultants, evaluation of patient's response to treatment, examination of patient, obtaining history from patient or surrogate, ordering and performing treatments and interventions, ordering and review of laboratory studies, ordering and review of radiographic studies, pulse oximetry and re-evaluation of patient's condition.   Leon Lyons was evaluated in Emergency Department on 09/09/2021 for the symptoms described in the history of present illness. He was evaluated in the context of the global COVID-19 pandemic, which necessitated consideration that the patient might be at risk for infection with the SARS-CoV-2 virus that causes COVID-19. Institutional protocols and algorithms that pertain to the evaluation of patients at risk for COVID-19 are in a state of rapid change based on information released by regulatory bodies including the CDC and federal and state organizations. These policies and algorithms were followed during the patient's care in the ED.  Final Clinical Impression(s) / ED Diagnoses Final diagnoses:  Dehydration  Pleural effusion on left  Atrial fibrillation with rapid ventricular response (HCC)  Gastrointestinal hemorrhage, unspecified gastrointestinal hemorrhage type  Symptomatic anemia  Non-small cell cancer of left lung (HCC)  On bridging treatment with lovenox    Rx / DC Orders ED Discharge Orders     None        Isla Pence, MD 09/09/21 1525

## 2021-09-09 NOTE — ED Notes (Signed)
Pt temp was done both in the mouth and the rectal, the first temp was done in the mouth the result was 98.2 and the rectal temp result was 100.0. Pt Doctor was notify and to the Nurse.  Also as I was doing his COVID test the pt was letting me know that his nose was bleeding on both sides.

## 2021-09-09 NOTE — ED Triage Notes (Signed)
Pt arrived via [facility] coming from home.  Recent new hx of a.fibb. Pt also had recent blood transfusion. Pt also had recent bowel obstruction.   Takes Lovenox for PEs  Hx: Lung cancer- mets??  Home health nurse states pt is really weak and may be dehydrated or anemic.   20g R [facility] EMS gave 200 mls of NS   Afibb RVR- 120's  BP-100/40 HR- 123 97% RA    No code on file?

## 2021-09-10 ENCOUNTER — Inpatient Hospital Stay (HOSPITAL_COMMUNITY): Payer: Medicare Other

## 2021-09-10 ENCOUNTER — Observation Stay (HOSPITAL_COMMUNITY): Payer: Medicare Other

## 2021-09-10 ENCOUNTER — Encounter (HOSPITAL_COMMUNITY): Payer: Self-pay

## 2021-09-10 DIAGNOSIS — Z86711 Personal history of pulmonary embolism: Secondary | ICD-10-CM | POA: Diagnosis not present

## 2021-09-10 DIAGNOSIS — Z9221 Personal history of antineoplastic chemotherapy: Secondary | ICD-10-CM | POA: Diagnosis not present

## 2021-09-10 DIAGNOSIS — Z86718 Personal history of other venous thrombosis and embolism: Secondary | ICD-10-CM | POA: Diagnosis not present

## 2021-09-10 DIAGNOSIS — I361 Nonrheumatic tricuspid (valve) insufficiency: Secondary | ICD-10-CM | POA: Diagnosis not present

## 2021-09-10 DIAGNOSIS — Z8679 Personal history of other diseases of the circulatory system: Secondary | ICD-10-CM | POA: Diagnosis not present

## 2021-09-10 DIAGNOSIS — D696 Thrombocytopenia, unspecified: Secondary | ICD-10-CM | POA: Diagnosis not present

## 2021-09-10 DIAGNOSIS — M25512 Pain in left shoulder: Secondary | ICD-10-CM | POA: Diagnosis present

## 2021-09-10 DIAGNOSIS — C3492 Malignant neoplasm of unspecified part of left bronchus or lung: Secondary | ICD-10-CM | POA: Diagnosis not present

## 2021-09-10 DIAGNOSIS — Y832 Surgical operation with anastomosis, bypass or graft as the cause of abnormal reaction of the patient, or of later complication, without mention of misadventure at the time of the procedure: Secondary | ICD-10-CM | POA: Diagnosis present

## 2021-09-10 DIAGNOSIS — R5381 Other malaise: Secondary | ICD-10-CM | POA: Diagnosis present

## 2021-09-10 DIAGNOSIS — E860 Dehydration: Secondary | ICD-10-CM | POA: Diagnosis present

## 2021-09-10 DIAGNOSIS — Z20822 Contact with and (suspected) exposure to covid-19: Secondary | ICD-10-CM | POA: Diagnosis not present

## 2021-09-10 DIAGNOSIS — D630 Anemia in neoplastic disease: Secondary | ICD-10-CM | POA: Diagnosis present

## 2021-09-10 DIAGNOSIS — J90 Pleural effusion, not elsewhere classified: Secondary | ICD-10-CM | POA: Diagnosis present

## 2021-09-10 DIAGNOSIS — J910 Malignant pleural effusion: Secondary | ICD-10-CM | POA: Diagnosis present

## 2021-09-10 DIAGNOSIS — K5720 Diverticulitis of large intestine with perforation and abscess without bleeding: Secondary | ICD-10-CM | POA: Diagnosis present

## 2021-09-10 DIAGNOSIS — C3490 Malignant neoplasm of unspecified part of unspecified bronchus or lung: Secondary | ICD-10-CM | POA: Diagnosis not present

## 2021-09-10 DIAGNOSIS — R7881 Bacteremia: Secondary | ICD-10-CM

## 2021-09-10 DIAGNOSIS — E43 Unspecified severe protein-calorie malnutrition: Secondary | ICD-10-CM | POA: Diagnosis not present

## 2021-09-10 DIAGNOSIS — Z7189 Other specified counseling: Secondary | ICD-10-CM | POA: Diagnosis not present

## 2021-09-10 DIAGNOSIS — T82330A Leakage of aortic (bifurcation) graft (replacement), initial encounter: Secondary | ICD-10-CM | POA: Diagnosis not present

## 2021-09-10 DIAGNOSIS — Z515 Encounter for palliative care: Secondary | ICD-10-CM | POA: Diagnosis not present

## 2021-09-10 DIAGNOSIS — E440 Moderate protein-calorie malnutrition: Secondary | ICD-10-CM | POA: Diagnosis present

## 2021-09-10 DIAGNOSIS — C7951 Secondary malignant neoplasm of bone: Secondary | ICD-10-CM | POA: Diagnosis not present

## 2021-09-10 DIAGNOSIS — M545 Low back pain, unspecified: Secondary | ICD-10-CM | POA: Diagnosis present

## 2021-09-10 DIAGNOSIS — R269 Unspecified abnormalities of gait and mobility: Secondary | ICD-10-CM | POA: Diagnosis present

## 2021-09-10 DIAGNOSIS — K651 Peritoneal abscess: Secondary | ICD-10-CM | POA: Diagnosis not present

## 2021-09-10 DIAGNOSIS — J9601 Acute respiratory failure with hypoxia: Secondary | ICD-10-CM | POA: Diagnosis not present

## 2021-09-10 DIAGNOSIS — N390 Urinary tract infection, site not specified: Secondary | ICD-10-CM | POA: Diagnosis present

## 2021-09-10 DIAGNOSIS — E876 Hypokalemia: Secondary | ICD-10-CM | POA: Diagnosis present

## 2021-09-10 DIAGNOSIS — I4891 Unspecified atrial fibrillation: Secondary | ICD-10-CM | POA: Diagnosis not present

## 2021-09-10 DIAGNOSIS — B9561 Methicillin susceptible Staphylococcus aureus infection as the cause of diseases classified elsewhere: Secondary | ICD-10-CM | POA: Diagnosis present

## 2021-09-10 DIAGNOSIS — D638 Anemia in other chronic diseases classified elsewhere: Secondary | ICD-10-CM | POA: Diagnosis not present

## 2021-09-10 DIAGNOSIS — E871 Hypo-osmolality and hyponatremia: Secondary | ICD-10-CM | POA: Diagnosis present

## 2021-09-10 DIAGNOSIS — D62 Acute posthemorrhagic anemia: Secondary | ICD-10-CM | POA: Diagnosis present

## 2021-09-10 DIAGNOSIS — I714 Abdominal aortic aneurysm, without rupture, unspecified: Secondary | ICD-10-CM | POA: Diagnosis not present

## 2021-09-10 DIAGNOSIS — E8809 Other disorders of plasma-protein metabolism, not elsewhere classified: Secondary | ICD-10-CM | POA: Diagnosis not present

## 2021-09-10 DIAGNOSIS — I4819 Other persistent atrial fibrillation: Secondary | ICD-10-CM | POA: Diagnosis present

## 2021-09-10 DIAGNOSIS — C787 Secondary malignant neoplasm of liver and intrahepatic bile duct: Secondary | ICD-10-CM | POA: Diagnosis not present

## 2021-09-10 DIAGNOSIS — I10 Essential (primary) hypertension: Secondary | ICD-10-CM | POA: Diagnosis present

## 2021-09-10 DIAGNOSIS — Z66 Do not resuscitate: Secondary | ICD-10-CM | POA: Diagnosis not present

## 2021-09-10 DIAGNOSIS — Z79899 Other long term (current) drug therapy: Secondary | ICD-10-CM | POA: Diagnosis not present

## 2021-09-10 DIAGNOSIS — K91840 Postprocedural hemorrhage and hematoma of a digestive system organ or structure following a digestive system procedure: Secondary | ICD-10-CM | POA: Diagnosis present

## 2021-09-10 DIAGNOSIS — D649 Anemia, unspecified: Secondary | ICD-10-CM

## 2021-09-10 DIAGNOSIS — I700 Atherosclerosis of aorta: Secondary | ICD-10-CM | POA: Diagnosis present

## 2021-09-10 DIAGNOSIS — Z87891 Personal history of nicotine dependence: Secondary | ICD-10-CM | POA: Diagnosis not present

## 2021-09-10 DIAGNOSIS — Y733 Surgical instruments, materials and gastroenterology and urology devices (including sutures) associated with adverse incidents: Secondary | ICD-10-CM | POA: Diagnosis present

## 2021-09-10 DIAGNOSIS — Z923 Personal history of irradiation: Secondary | ICD-10-CM | POA: Diagnosis not present

## 2021-09-10 DIAGNOSIS — C3412 Malignant neoplasm of upper lobe, left bronchus or lung: Secondary | ICD-10-CM | POA: Diagnosis present

## 2021-09-10 DIAGNOSIS — J9811 Atelectasis: Secondary | ICD-10-CM | POA: Diagnosis present

## 2021-09-10 LAB — RENAL FUNCTION PANEL
Albumin: 1.6 g/dL — ABNORMAL LOW (ref 3.5–5.0)
Anion gap: 3 — ABNORMAL LOW (ref 5–15)
BUN: 17 mg/dL (ref 8–23)
CO2: 23 mmol/L (ref 22–32)
Calcium: 7.8 mg/dL — ABNORMAL LOW (ref 8.9–10.3)
Chloride: 105 mmol/L (ref 98–111)
Creatinine, Ser: 0.76 mg/dL (ref 0.61–1.24)
GFR, Estimated: >60 mL/min (ref >60)
Glucose, Bld: 87 mg/dL (ref 70–99)
Phosphorus: 3 mg/dL (ref 2.5–4.6)
Potassium: 4.1 mmol/L (ref 3.5–5.1)
Sodium: 131 mmol/L — ABNORMAL LOW (ref 135–145)

## 2021-09-10 LAB — BLOOD CULTURE ID PANEL (REFLEXED) - BCID2

## 2021-09-10 LAB — ECHOCARDIOGRAM LIMITED
Height: 70 in
MV M vel: 3.7 m/s
MV Peak grad: 54.8 mmHg
P 1/2 time: 720 msec
Weight: 2638.47 oz

## 2021-09-10 LAB — CBC
HCT: 25 % — ABNORMAL LOW (ref 39.0–52.0)
Hemoglobin: 8 g/dL — ABNORMAL LOW (ref 13.0–17.0)
MCH: 30.8 pg (ref 26.0–34.0)
MCHC: 32 g/dL (ref 30.0–36.0)
MCV: 96.2 fL (ref 80.0–100.0)
Platelets: 222 10*3/uL (ref 150–400)
RBC: 2.6 MIL/uL — ABNORMAL LOW (ref 4.22–5.81)
RDW: 17.4 % — ABNORMAL HIGH (ref 11.5–15.5)
WBC: 9.1 10*3/uL (ref 4.0–10.5)
nRBC: 0 % (ref 0.0–0.2)

## 2021-09-10 LAB — PROCALCITONIN: Procalcitonin: 0.62 ng/mL

## 2021-09-10 MED ORDER — CYCLOBENZAPRINE HCL 10 MG PO TABS
5.0000 mg | ORAL_TABLET | Freq: Three times a day (TID) | ORAL | Status: DC | PRN
  Administered 2021-09-12 – 2021-09-14 (×3): 5 mg via ORAL
  Filled 2021-09-10 (×3): qty 1

## 2021-09-10 MED ORDER — BARIUM SULFATE 2.1 % PO SUSP
450.0000 mL | Freq: Once | ORAL | Status: AC
  Administered 2021-09-10: 450 mL via ORAL

## 2021-09-10 MED ORDER — ALBUMIN HUMAN 5 % IV SOLN
25.0000 g | Freq: Four times a day (QID) | INTRAVENOUS | Status: AC
  Administered 2021-09-10 – 2021-09-11 (×3): 25 g via INTRAVENOUS
  Filled 2021-09-10 (×4): qty 500

## 2021-09-10 MED ORDER — BISACODYL 10 MG RE SUPP
10.0000 mg | Freq: Once | RECTAL | Status: AC
  Administered 2021-09-10: 10 mg via RECTAL
  Filled 2021-09-10: qty 1

## 2021-09-10 MED ORDER — IOHEXOL 350 MG/ML SOLN
60.0000 mL | Freq: Once | INTRAVENOUS | Status: AC | PRN
  Administered 2021-09-10: 60 mL via INTRAVENOUS

## 2021-09-10 MED ORDER — IOHEXOL 350 MG/ML SOLN
80.0000 mL | Freq: Once | INTRAVENOUS | Status: AC | PRN
  Administered 2021-09-10: 80 mL via INTRAVENOUS

## 2021-09-10 MED ORDER — CEFAZOLIN SODIUM-DEXTROSE 2-4 GM/100ML-% IV SOLN
2.0000 g | Freq: Three times a day (TID) | INTRAVENOUS | Status: DC
  Administered 2021-09-10 – 2021-09-17 (×22): 2 g via INTRAVENOUS
  Filled 2021-09-10 (×23): qty 100

## 2021-09-10 NOTE — Progress Notes (Signed)
PHARMACY - PHYSICIAN COMMUNICATION CRITICAL VALUE ALERT - BLOOD CULTURE IDENTIFICATION (BCID)  Leon Lyons is an 87 y.o. adult who presented to [facility] on 09/09/2021 with a chief complaint of generalized weakness in the setting of symptomatic anemia  Assessment:  GPC 1 aerobic, 1 anaerobic, staph aureus, no resistance  Name of physician (or Provider) Contacted: x Blount  Current antibiotics: ctx and azithromax  Changes to prescribed antibiotics recommended:  D/c ctx Start cefazolin 2gm IV q8h  Results for orders placed or performed during the hospital encounter of 09/09/21  Blood Culture ID Panel (Reflexed) (Collected: 09/09/2021 12:48 PM)  Result Value Ref Range   Enterococcus faecalis NOT DETECTED NOT DETECTED   Enterococcus Faecium NOT DETECTED NOT DETECTED   Listeria monocytogenes NOT DETECTED NOT DETECTED   Staphylococcus species DETECTED (A) NOT DETECTED   Staphylococcus aureus (BCID) DETECTED (A) NOT DETECTED   Staphylococcus epidermidis NOT DETECTED NOT DETECTED   Staphylococcus lugdunensis NOT DETECTED NOT DETECTED   Streptococcus species NOT DETECTED NOT DETECTED   Streptococcus agalactiae NOT DETECTED NOT DETECTED   Streptococcus pneumoniae NOT DETECTED NOT DETECTED   Streptococcus pyogenes NOT DETECTED NOT DETECTED   A.calcoaceticus-baumannii NOT DETECTED NOT DETECTED   Bacteroides fragilis NOT DETECTED NOT DETECTED   Enterobacterales NOT DETECTED NOT DETECTED   Enterobacter cloacae complex NOT DETECTED NOT DETECTED   Escherichia coli NOT DETECTED NOT DETECTED   Klebsiella aerogenes NOT DETECTED NOT DETECTED   Klebsiella oxytoca NOT DETECTED NOT DETECTED   Klebsiella pneumoniae NOT DETECTED NOT DETECTED   Proteus species NOT DETECTED NOT DETECTED   Salmonella species NOT DETECTED NOT DETECTED   Serratia marcescens NOT DETECTED NOT DETECTED   Haemophilus influenzae NOT DETECTED NOT DETECTED   Neisseria meningitidis NOT DETECTED NOT DETECTED   Pseudomonas  aeruginosa NOT DETECTED NOT DETECTED   Stenotrophomonas maltophilia NOT DETECTED NOT DETECTED   Candida albicans NOT DETECTED NOT DETECTED   Candida auris NOT DETECTED NOT DETECTED   Candida glabrata NOT DETECTED NOT DETECTED   Candida krusei NOT DETECTED NOT DETECTED   Candida parapsilosis NOT DETECTED NOT DETECTED   Candida tropicalis NOT DETECTED NOT DETECTED   Cryptococcus neoformans/gattii NOT DETECTED NOT DETECTED   Meth resistant mecA/C and MREJ NOT DETECTED NOT DETECTED    Dolly Rias RPh 09/10/2021, 5:21 AM

## 2021-09-10 NOTE — Plan of Care (Signed)
  Problem: Education: Goal: Knowledge of General Education information will improve Description Including pain rating scale, medication(s)/side effects and non-pharmacologic comfort measures Outcome: Progressing   

## 2021-09-10 NOTE — Progress Notes (Signed)
Received page from radiology. 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 Paged Vasc sx, Dr Scot Dock to review CT. He will need to be transferred to [facility] for probable surgery. RN made aware. Transfer orders placed.  Lovey Newcomer, NP [phone] [facility] [phone]

## 2021-09-10 NOTE — Progress Notes (Signed)
Limited 2D echocardiogram completed.  09/10/2021 2:23 PM Kelby Aline., MHA, RVT, RDCS, RDMS

## 2021-09-10 NOTE — Progress Notes (Addendum)
PROGRESS NOTE    Leon Lyons  TDD:[id number] DOB: 1933/01/12 DOA: 09/09/2021 PCP: [name], MD   Brief Narrative: 87 year old male with recent diagnosis April 2022 of non-small cell lung cancer status postradiation and chemotherapy was about to start immunotherapy, history of atrial fibrillation and PE anticoagulated with Lovenox at home  Patient was admitted to [facility] nov 2022 for volvulus surgery followed by which she developed pulmonary embolism and atrial fibrillation. Wife reports that patient has been getting progressively weak with decreased appetite decreased p.o. intake hypotension at home.  She reports he does not eat or drink well. FOBT positive in the ER.  Assessment & Plan:   Principal Problem:   Symptomatic anemia Active Problems:   Non-small cell carcinoma of left lung, stage 3 (HCC)   Pleural effusion on left   Hyponatremia   Persistent atrial fibrillation (HCC)   Pulmonary embolism (HCC)   Protein-calorie malnutrition, severe (HCC)   #1 acute on chronic anemia patient admitted with generalized weakness decreased p.o. intake and weight loss with a recent diagnosis of lung cancer.  His hemoglobin on admission was 7.4 and he was FOBT positive.  He was given 1 unit of packed RBC and GI was consulted.  GI signed off 09/09/2021 .  It was thought that he probably is bleeding from the surgical site of the volvulus anastomosis. patient has been on Lovenox 80 mg twice a day at home for recent diagnosis of pulmonary embolism and A. fib.  Patient had volvulus  surgery in the [facility] in November 2022.  Will obtain records from [facility].  Patient was tachycardic hypotensive on admission and remains tachycardic and hypotensive. Continue Protonix Anemia panel severe iron deficiency  Start Feraheme Folate 5.2 replace B12 667    #2 staff aureus bacteremia appreciate ID consult.  Echo ordered.  Concern for new left pleural effusion/empyema  .  #2 stage III non-small cell lung cancer left lung followed by Dr. Julien Nordmann.  Chest x-ray shows moderate left pleural effusion, left lower lobe could not be visualized well.  Will inform oncology of patient's admission.  #3 hyponatremia sodium 131 from 129 with normal saline likely due to decreased p.o. intake and nutritional deficiency.  #4 chronic atrial fibrillation was on beta-blocker and Cardizem at home  #5 history of pulmonary embolism was on Lovenox at home.  Will  restart Lovenox in 24 hours if hemoglobin remains stable and no further bleeding.  #6 moderate left pleural effusion with possible left lower lobe infiltrates-patient has been started on antibiotics Ancef and azithromycin in the ED.  Will continue for now.  Patient on room air with normal saturation. Procalcitonin 0.62 lactic acid 1.8.  #7 protein calorie malnutrition with albumin of 1.6 dietary consult and albumin infusion.  #8 history of essential hypertension his blood pressure remains soft with tachycardia we will hold beta-blocker continue Cardizem for A. fib.   Estimated body mass index is 23.66 kg/m as calculated from the following:   Height as of this encounter: 5\' 10"  (1.778 m).   Weight as of this encounter: 74.8 kg.  DVT prophylaxis: None due to FOBT positive and iron deficiency  code Status: Full code Family Communication: Discussed with wife on the phone disposition Plan:  Status is: Observation   Consultants:  None  Procedures: None Antimicrobials: Cefepime and azithromycin  Subjective: Patient is resting in bed he reports his left leg has been hurting and having difficulty walking the left knee appeared red he denies hitting his knee  or having falls prior to admission.  Complains of constipation has not had a BM over 1 week  Objective: Vitals:   09/10/21 0104 09/10/21 0511 09/10/21 0842 09/10/21 0954  BP: 98/66 (!) 95/56 (!) 107/55   Pulse: 86 90 93   Resp: 14 16 16    Temp: 98.4 F (36.9 C)  98.2 F (36.8 C) 98.2 F (36.8 C)   TempSrc: Oral Oral Oral   SpO2: 97% 96% 94%   Weight:    74.8 kg  Height:        Intake/Output Summary (Last 24 hours) at 09/10/2021 1120 Last data filed at 09/10/2021 1026 Gross per 24 hour  Intake 4315 ml  Output 1000 ml  Net 3315 ml   Filed Weights   09/09/21 1300 09/09/21 2112 09/10/21 0954  Weight: 76 kg 72 kg 74.8 kg    Examination:  General exam: Appears in mild distress due to left leg pain Respiratory system diminished breath sounds to the left base to auscultation. Respiratory effort normal. Cardiovascular system: S1 & S2 heard, RRR. No JVD, murmurs, rubs, gallops or clicks. No pedal edema. Gastrointestinal system: Abdomen is distended, soft and nontender. No organomegaly or masses felt. Normal bowel sounds heard. Central nervous system: Alert and oriented. No focal neurological deficits. Extremities: Left knee is swollen with decreased range of motion skin: No rashes, lesions or ulcers Psychiatry: Judgement and insight appear normal. Mood & affect appropriate.     Data Reviewed: I have personally reviewed following labs and imaging studies  CBC: Recent Labs  Lab 09/09/21 1248 09/10/21 0833  WBC 10.5 9.1  NEUTROABS 9.0*  --   HGB 7.4* 8.0*  HCT 24.0* 25.0*  MCV 98.0 96.2  PLT 244 169   Basic Metabolic Panel: Recent Labs  Lab 09/09/21 1248 09/10/21 0510  NA 129* 131*  K 3.7 4.1  CL 98 105  CO2 25 23  GLUCOSE 130* 87  BUN 21 17  CREATININE 0.85 0.76  CALCIUM 8.3* 7.8*  PHOS  --  3.0   GFR: Estimated Creatinine Clearance (by C-G formula based on SCr of 0.76 mg/dL) Male: 54.6 mL/min Male: 68.4 mL/min Liver Function Tests: Recent Labs  Lab 09/09/21 1248 09/10/21 0510  AST 14*  --   ALT 13  --   ALKPHOS 83  --   BILITOT 0.3  --   PROT 6.6  --   ALBUMIN 1.8* 1.6*   No results for input(s): LIPASE, AMYLASE in the last 168 hours. No results for input(s): AMMONIA in the last 168 hours. Coagulation  Profile: Recent Labs  Lab 09/09/21 1248  INR 1.2   Cardiac Enzymes: No results for input(s): CKTOTAL, CKMB, CKMBINDEX, TROPONINI in the last 168 hours. BNP (last 3 results) No results for input(s): PROBNP in the last 8760 hours. HbA1C: No results for input(s): HGBA1C in the last 72 hours. CBG: No results for input(s): GLUCAP in the last 168 hours. Lipid Profile: No results for input(s): CHOL, HDL, LDLCALC, TRIG, CHOLHDL, LDLDIRECT in the last 72 hours. Thyroid Function Tests: No results for input(s): TSH, T4TOTAL, FREET4, T3FREE, THYROIDAB in the last 72 hours. Anemia Panel: Recent Labs    09/09/21 1248 09/09/21 1721  VITAMINB12  --  667  FOLATE  --  5.2*  FERRITIN  --  531*  TIBC  --  128*  IRON  --  14*  RETICCTPCT 2.1  --    Sepsis Labs: Recent Labs  Lab 09/09/21 1248 09/10/21 0510  PROCALCITON  --  0.62  LATICACIDVEN 1.8  --     Recent Results (from the past 240 hour(s))  Blood Culture (routine x 2)     Status: None (Preliminary result)   Collection Time: 09/09/21 12:48 PM   Specimen: BLOOD  Result Value Ref Range Status   Specimen Description   Final    BLOOD LEFT ANTECUBITAL Performed at [facility] [address] [place], [place] [address]    Special Requests   Final    BOTTLES DRAWN AEROBIC AND ANAEROBIC Blood Culture results may not be optimal due to an excessive volume of blood received in culture bottles Performed at [facility] [address], [place], [place] [address]    Culture  Setup Time   Final    GRAM POSITIVE COCCI IN BOTH AEROBIC AND ANAEROBIC BOTTLES CRITICAL RESULT CALLED TO, READ BACK BY AND VERIFIED WITH: PHARMD ELLEN JACKSON 09/10/21@5 :10 BY TW Performed at [facility], [place] [address] [place], [place] [address]    Culture GRAM POSITIVE COCCI  Final   Report Status PENDING  Incomplete  Resp Panel by RT-PCR (Flu A&B, Covid) Nasopharyngeal Swab     Status: None   Collection Time:  09/09/21 12:48 PM   Specimen: Nasopharyngeal Swab; Nasopharyngeal(NP) swabs in vial transport medium  Result Value Ref Range Status   SARS Coronavirus 2 by RT PCR NEGATIVE NEGATIVE Final    Comment: (NOTE) SARS-CoV-2 target nucleic acids are NOT DETECTED.  The SARS-CoV-2 RNA is generally detectable in upper respiratory specimens during the acute phase of infection. The lowest concentration of SARS-CoV-2 viral copies this assay can detect is 138 copies/mL. A negative result does not preclude SARS-Cov-2 infection and should not be used as the sole basis for treatment or other patient management decisions. A negative result may occur with  improper specimen collection/handling, submission of specimen other than nasopharyngeal swab, presence of viral mutation(s) within the areas targeted by this assay, and inadequate number of viral copies(<138 copies/mL). A negative result must be combined with clinical observations, patient history, and epidemiological information. The expected result is Negative.  Fact Sheet for Patients:  EntrepreneurPulse.com.au  Fact Sheet for Healthcare Providers:  IncredibleEmployment.be  This test is no t yet approved or cleared by the [place] FDA and  has been authorized for detection and/or diagnosis of SARS-CoV-2 by FDA under an Emergency Use Authorization (EUA). This EUA will remain  in effect (meaning this test can be used) for the duration of the COVID-19 declaration under Section 564(b)(1) of the Act, 21 U.S.C.section 360bbb-3(b)(1), unless the authorization is terminated  or revoked sooner.       Influenza A by PCR NEGATIVE NEGATIVE Final   Influenza B by PCR NEGATIVE NEGATIVE Final    Comment: (NOTE) The Xpert Xpress SARS-CoV-2/FLU/RSV plus assay is intended as an aid in the diagnosis of influenza from Nasopharyngeal swab specimens and should not be used as a sole basis for treatment. Nasal washings  and aspirates are unacceptable for Xpert Xpress SARS-CoV-2/FLU/RSV testing.  Fact Sheet for Patients: EntrepreneurPulse.com.au  Fact Sheet for Healthcare Providers: IncredibleEmployment.be  This test is not yet approved or cleared by the [place] FDA and has been authorized for detection and/or diagnosis of SARS-CoV-2 by FDA under an Emergency Use Authorization (EUA). This EUA will remain in effect (meaning this test can be used) for the duration of the COVID-19 declaration under Section 564(b)(1) of the Act, 21 U.S.C. section 360bbb-3(b)(1), unless the authorization is terminated or revoked.  Performed at  [facility], [place] [address] [place], [place] [address]   Blood Culture ID Panel (Reflexed)     Status: Abnormal   Collection Time: 09/09/21 12:48 PM  Result Value Ref Range Status   Enterococcus faecalis NOT DETECTED NOT DETECTED Final   Enterococcus Faecium NOT DETECTED NOT DETECTED Final   Listeria monocytogenes NOT DETECTED NOT DETECTED Final   Staphylococcus species DETECTED (A) NOT DETECTED Final    Comment: CRITICAL RESULT CALLED TO, READ BACK BY AND VERIFIED WITH: PHARMD ELLEN JACKSON 09/10/21@5 :10 BY TW    Staphylococcus aureus (BCID) DETECTED (A) NOT DETECTED Final    Comment: CRITICAL RESULT CALLED TO, READ BACK BY AND VERIFIED WITH: PHARMD ELLEN JACKSON 09/10/21@5 :10 BY TW    Staphylococcus epidermidis NOT DETECTED NOT DETECTED Final   Staphylococcus lugdunensis NOT DETECTED NOT DETECTED Final   Streptococcus species NOT DETECTED NOT DETECTED Final   Streptococcus agalactiae NOT DETECTED NOT DETECTED Final   Streptococcus pneumoniae NOT DETECTED NOT DETECTED Final   Streptococcus pyogenes NOT DETECTED NOT DETECTED Final   A.calcoaceticus-baumannii NOT DETECTED NOT DETECTED Final   Bacteroides fragilis NOT DETECTED NOT DETECTED Final   Enterobacterales NOT DETECTED NOT DETECTED Final   Enterobacter  cloacae complex NOT DETECTED NOT DETECTED Final   Escherichia coli NOT DETECTED NOT DETECTED Final   Klebsiella aerogenes NOT DETECTED NOT DETECTED Final   Klebsiella oxytoca NOT DETECTED NOT DETECTED Final   Klebsiella pneumoniae NOT DETECTED NOT DETECTED Final   Proteus species NOT DETECTED NOT DETECTED Final   Salmonella species NOT DETECTED NOT DETECTED Final   Serratia marcescens NOT DETECTED NOT DETECTED Final   Haemophilus influenzae NOT DETECTED NOT DETECTED Final   Neisseria meningitidis NOT DETECTED NOT DETECTED Final   Pseudomonas aeruginosa NOT DETECTED NOT DETECTED Final   Stenotrophomonas maltophilia NOT DETECTED NOT DETECTED Final   Candida albicans NOT DETECTED NOT DETECTED Final   Candida auris NOT DETECTED NOT DETECTED Final   Candida glabrata NOT DETECTED NOT DETECTED Final   Candida krusei NOT DETECTED NOT DETECTED Final   Candida parapsilosis NOT DETECTED NOT DETECTED Final   Candida tropicalis NOT DETECTED NOT DETECTED Final   Cryptococcus neoformans/gattii NOT DETECTED NOT DETECTED Final   Meth resistant mecA/C and MREJ NOT DETECTED NOT DETECTED Final    Comment: Performed at [facility], [address], [place], [place] [address]  Blood Culture (routine x 2)     Status: None (Preliminary result)   Collection Time: 09/09/21 12:53 PM   Specimen: BLOOD  Result Value Ref Range Status   Specimen Description   Final    BLOOD RIGHT ANTECUBITAL Performed at [facility] [address], [place], [place] [address]    Special Requests   Final    BOTTLES DRAWN AEROBIC AND ANAEROBIC Blood Culture adequate volume Performed at [facility] [address] [place], [place] [address]    Culture  Setup Time   Final    GRAM POSITIVE COCCI IN BOTH AEROBIC AND ANAEROBIC BOTTLES CRITICAL VALUE NOTED.  VALUE IS CONSISTENT WITH PREVIOUSLY REPORTED AND CALLED VALUE.    Culture   Final    NO GROWTH < 24 HOURS Performed at [facility], [place] [address], [place], [place] [address]    Report Status PENDING  Incomplete         Radiology Studies: DG Chest Port 1 View  Result Date: 09/09/2021 CLINICAL DATA:  Concern for sepsis EXAM: PORTABLE CHEST 1 VIEW COMPARISON:  08/19/2019 FINDINGS: normal cardiac silhouette. Moderate LEFT effusion is new from prior. LEFT lung base not well evaluated. RIGHT lung clear. IMPRESSION: New moderate size LEFT effusion. Cannot exclude LEFT lower lobe atelectasis or infiltrate. Electronically Signed   By: Suzy Bouchard M.D.   On: 09/09/2021 13:43        Scheduled Meds:  (feeding supplement) PROSource Plus  30 mL Oral BID   diltiazem  120 mg Oral Daily   folic acid  1 mg Oral Daily   pantoprazole  40 mg Oral Daily   sodium chloride  1 spray Each Nare Once   Continuous Infusions:  sodium chloride 75 mL/hr at 09/09/21 2159   azithromycin Stopped (09/09/21 1838)    ceFAZolin (ANCEF) IV 2 g (09/10/21 0549)     LOS: 0 days     Georgette Shell, MD 09/10/2021, 11:20 AM

## 2021-09-10 NOTE — Progress Notes (Signed)
Patient transferred to [facility] as per ordered by [facility] NP, for possible surgical intervention. [place] NP came up to see patient for explanation as patient getting upset being transferred to another hospital. [name] notified via phone. Vitals WNL. Reports given to [facility] RN at [facility] unit floor. Patient was picked up by Care link team.

## 2021-09-10 NOTE — Progress Notes (Signed)
ABD is distended, decreased BS non-tender. C/O left knee pain, small redden area, appear that pt may have hit on unidentified object or some sort, however, pt does not recall hitting his knee. left knee stiff and decrease flexion vs right knee/leg. Up in chair with 2 assist with walker for support. MD made aware. SRP, RN

## 2021-09-10 NOTE — Consult Note (Signed)
[facility]       Reason for Consult: bacteremia    Referring Physician: CHAMP autoconsult  Principal Problem:   Symptomatic anemia Active Problems:   Non-small cell carcinoma of left lung, stage 3 (HCC)   Pleural effusion on left   Hyponatremia   Persistent atrial fibrillation (HCC)   Pulmonary embolism (HCC)   Protein-calorie malnutrition, severe (HCC)    (feeding supplement) PROSource Plus  30 mL Oral BID   bisacodyl  10 mg Rectal Once   diltiazem  120 mg Oral Daily   folic acid  1 mg Oral Daily   pantoprazole  40 mg Oral Daily   sodium chloride  1 spray Each Nare Once    Recommendations: Cefazolin Repeat blood cultures  CT lungs with contrast for ? Parapneumonic infection    Assessment: He has bacteremia of unknown etiology.  New left pleural effusion and ? If infected/empyema/abscess and will check a CT scan.  Will also need an echo.   Antibiotics: Cefazolin Previous azithromycin, ceftriaxone  HPI: Leon Lyons is a 87 y.o. adult male with a history of non-small cell lung cancer of the left upper lobe diagnosed in April 2022 s/p 6 cycles of paclitaxel and carboplatin and came in with progressive weakness and fatigue with anemia.  He was found to have a left lobe pleural effusion and though to have pneumonia and blood cultures sent, now positive for 4/4 with Staphu aureus. He has recently undergone surgical treatment for volvulus and has not recovered since by his report.  His wife is at the bedside.  He reports no fever or chills.     Review of Systems:  Constitutional: negative for fevers and chills; positive for weakness, fatigue Respiratory: negative for cough or hemoptysis Gastrointestinal: negative for nausea and diarrhea Integument/breast: negative for rash All other systems reviewed and are negative    Past Medical History:  Diagnosis Date   History of radiation therapy 03/24/2021   left lung   02/11/2021-03/24/2021  Dr  Gery Pray   Lung cancer [facility])    Tobacco use     Social History   Tobacco Use   Smoking status: Former    Years: 5.00    Types: Cigarettes    Quit date: 1962    Years since quitting: 60.9   Smokeless tobacco: Never   Tobacco comments:    light/occasional smoker back in early 1960's  Vaping Use   Vaping Use: Never used    Family History  Problem Relation Age of Onset   Hypertension Other     Allergies  Allergen Reactions   Diclofenac Sodium Other (See Comments)    Kidneys were affected    Physical Exam: Constitutional: in no apparent distress  Vitals:   09/10/21 0511 09/10/21 0842  BP: (!) 95/56 (!) 107/55  Pulse: 90 93  Resp: 16 16  Temp: 98.2 F (36.8 C) 98.2 F (36.8 C)  SpO2: 96% 94%   EYES: anicteric ENMT: no thrush Cardiovascular: Cor RRR Respiratory: clear; Musculoskeletal: no pedal edema noted Skin: negatives: no rash Neuro: non-focal  Lab Results  Component Value Date   WBC 9.1 09/10/2021   HGB 8.0 (L) 09/10/2021   HCT 25.0 (L) 09/10/2021   MCV 96.2 09/10/2021   PLT 222 09/10/2021    Lab Results  Component Value Date   CREATININE 0.76 09/10/2021   BUN 17 09/10/2021   NA 131 (L) 09/10/2021   K 4.1 09/10/2021   CL 105 09/10/2021  CO2 23 09/10/2021    Lab Results  Component Value Date   ALT 13 09/09/2021   AST 14 (L) 09/09/2021   ALKPHOS 83 09/09/2021     Microbiology: Recent Results (from the past 240 hour(s))  Blood Culture (routine x 2)     Status: None (Preliminary result)   Collection Time: 09/09/21 12:48 PM   Specimen: BLOOD  Result Value Ref Range Status   Specimen Description   Final    BLOOD LEFT ANTECUBITAL Performed at [facility] [address] [place], [place] [address]    Special Requests   Final    BOTTLES DRAWN AEROBIC AND ANAEROBIC Blood Culture results may not be optimal due to an excessive volume of blood received in culture bottles Performed at [facility] [address], [place], [place] [address]    Culture  Setup Time   Final    GRAM POSITIVE COCCI IN BOTH AEROBIC AND ANAEROBIC BOTTLES CRITICAL RESULT CALLED TO, READ BACK BY AND VERIFIED WITH: PHARMD ELLEN JACKSON 09/10/21@5 :10 BY TW Performed at [facility], [place] [address] [place], [place] [address]    Culture GRAM POSITIVE COCCI  Final   Report Status PENDING  Incomplete  Resp Panel by RT-PCR (Flu A&B, Covid) Nasopharyngeal Swab     Status: None   Collection Time: 09/09/21 12:48 PM   Specimen: Nasopharyngeal Swab; Nasopharyngeal(NP) swabs in vial transport medium  Result Value Ref Range Status   SARS Coronavirus 2 by RT PCR NEGATIVE NEGATIVE Final    Comment: (NOTE) SARS-CoV-2 target nucleic acids are NOT DETECTED.  The SARS-CoV-2 RNA is generally detectable in upper respiratory specimens during the acute phase of infection. The lowest concentration of SARS-CoV-2 viral copies this assay can detect is 138 copies/mL. A negative result does not preclude SARS-Cov-2 infection and should not be used as the sole basis for treatment or other patient management decisions. A negative result may occur with  improper specimen collection/handling, submission of specimen other than nasopharyngeal swab, presence of viral mutation(s) within the areas targeted by this assay, and inadequate number of viral copies(<138 copies/mL). A negative result must be combined with clinical observations, patient history, and epidemiological information. The expected result is Negative.  Fact Sheet for Patients:  EntrepreneurPulse.com.au  Fact Sheet for Healthcare Providers:  IncredibleEmployment.be  This test is no t yet approved or cleared by the [place] FDA and  has been authorized for detection and/or diagnosis of SARS-CoV-2 by FDA under an Emergency Use Authorization (EUA). This EUA will remain  in effect (meaning this test can be used) for the duration of  the COVID-19 declaration under Section 564(b)(1) of the Act, 21 U.S.C.section 360bbb-3(b)(1), unless the authorization is terminated  or revoked sooner.       Influenza A by PCR NEGATIVE NEGATIVE Final   Influenza B by PCR NEGATIVE NEGATIVE Final    Comment: (NOTE) The Xpert Xpress SARS-CoV-2/FLU/RSV plus assay is intended as an aid in the diagnosis of influenza from Nasopharyngeal swab specimens and should not be used as a sole basis for treatment. Nasal washings and aspirates are unacceptable for Xpert Xpress SARS-CoV-2/FLU/RSV testing.  Fact Sheet for Patients: EntrepreneurPulse.com.au  Fact Sheet for Healthcare Providers: IncredibleEmployment.be  This test is not yet approved or cleared by the [place] FDA and has been authorized for detection and/or diagnosis of SARS-CoV-2 by FDA under an Emergency Use Authorization (EUA). This EUA will remain in effect (meaning this test can be used) for the  duration of the COVID-19 declaration under Section 564(b)(1) of the Act, 21 U.S.C. section 360bbb-3(b)(1), unless the authorization is terminated or revoked.  Performed at [facility], [place] [address] [place], [place] [address]   Blood Culture ID Panel (Reflexed)     Status: Abnormal   Collection Time: 09/09/21 12:48 PM  Result Value Ref Range Status   Enterococcus faecalis NOT DETECTED NOT DETECTED Final   Enterococcus Faecium NOT DETECTED NOT DETECTED Final   Listeria monocytogenes NOT DETECTED NOT DETECTED Final   Staphylococcus species DETECTED (A) NOT DETECTED Final    Comment: CRITICAL RESULT CALLED TO, READ BACK BY AND VERIFIED WITH: PHARMD ELLEN JACKSON 09/10/21@5 :10 BY TW    Staphylococcus aureus (BCID) DETECTED (A) NOT DETECTED Final    Comment: CRITICAL RESULT CALLED TO, READ BACK BY AND VERIFIED WITH: PHARMD ELLEN JACKSON 09/10/21@5 :10 BY TW    Staphylococcus epidermidis NOT DETECTED NOT DETECTED Final    Staphylococcus lugdunensis NOT DETECTED NOT DETECTED Final   Streptococcus species NOT DETECTED NOT DETECTED Final   Streptococcus agalactiae NOT DETECTED NOT DETECTED Final   Streptococcus pneumoniae NOT DETECTED NOT DETECTED Final   Streptococcus pyogenes NOT DETECTED NOT DETECTED Final   A.calcoaceticus-baumannii NOT DETECTED NOT DETECTED Final   Bacteroides fragilis NOT DETECTED NOT DETECTED Final   Enterobacterales NOT DETECTED NOT DETECTED Final   Enterobacter cloacae complex NOT DETECTED NOT DETECTED Final   Escherichia coli NOT DETECTED NOT DETECTED Final   Klebsiella aerogenes NOT DETECTED NOT DETECTED Final   Klebsiella oxytoca NOT DETECTED NOT DETECTED Final   Klebsiella pneumoniae NOT DETECTED NOT DETECTED Final   Proteus species NOT DETECTED NOT DETECTED Final   Salmonella species NOT DETECTED NOT DETECTED Final   Serratia marcescens NOT DETECTED NOT DETECTED Final   Haemophilus influenzae NOT DETECTED NOT DETECTED Final   Neisseria meningitidis NOT DETECTED NOT DETECTED Final   Pseudomonas aeruginosa NOT DETECTED NOT DETECTED Final   Stenotrophomonas maltophilia NOT DETECTED NOT DETECTED Final   Candida albicans NOT DETECTED NOT DETECTED Final   Candida auris NOT DETECTED NOT DETECTED Final   Candida glabrata NOT DETECTED NOT DETECTED Final   Candida krusei NOT DETECTED NOT DETECTED Final   Candida parapsilosis NOT DETECTED NOT DETECTED Final   Candida tropicalis NOT DETECTED NOT DETECTED Final   Cryptococcus neoformans/gattii NOT DETECTED NOT DETECTED Final   Meth resistant mecA/C and MREJ NOT DETECTED NOT DETECTED Final    Comment: Performed at [facility], [address] [place], [place] [address]  Blood Culture (routine x 2)     Status: None (Preliminary result)   Collection Time: 09/09/21 12:53 PM   Specimen: BLOOD  Result Value Ref Range Status   Specimen Description   Final    BLOOD RIGHT ANTECUBITAL Performed at [facility]  [address] [place], [place] [address]    Special Requests   Final    BOTTLES DRAWN AEROBIC AND ANAEROBIC Blood Culture adequate volume Performed at [facility] [address], [place], [place] [address]    Culture  Setup Time   Final    GRAM POSITIVE COCCI IN BOTH AEROBIC AND ANAEROBIC BOTTLES CRITICAL VALUE NOTED.  VALUE IS CONSISTENT WITH PREVIOUSLY REPORTED AND CALLED VALUE.    Culture   Final    NO GROWTH < 24 HOURS Performed at [facility], [place] [address] [name], [place] [address]    Report Status PENDING  Incomplete    Thayer Headings, MD  [facility] [email or web address] 09/10/2021, 12:15 PM

## 2021-09-11 ENCOUNTER — Inpatient Hospital Stay (HOSPITAL_COMMUNITY): Payer: Medicare Other

## 2021-09-11 ENCOUNTER — Inpatient Hospital Stay (HOSPITAL_COMMUNITY): Payer: Medicare Other | Admitting: Anesthesiology

## 2021-09-11 ENCOUNTER — Encounter (HOSPITAL_COMMUNITY)
Admission: EM | Disposition: A | Discharge status: Rehabilitation Facility | Payer: Self-pay | Source: Home / Self Care | Attending: Internal Medicine

## 2021-09-11 DIAGNOSIS — C3492 Malignant neoplasm of unspecified part of left bronchus or lung: Secondary | ICD-10-CM | POA: Diagnosis not present

## 2021-09-11 DIAGNOSIS — I714 Abdominal aortic aneurysm, without rupture, unspecified: Secondary | ICD-10-CM

## 2021-09-11 DIAGNOSIS — I2699 Other pulmonary embolism without acute cor pulmonale: Secondary | ICD-10-CM

## 2021-09-11 DIAGNOSIS — Z79899 Other long term (current) drug therapy: Secondary | ICD-10-CM

## 2021-09-11 DIAGNOSIS — D649 Anemia, unspecified: Secondary | ICD-10-CM | POA: Diagnosis not present

## 2021-09-11 DIAGNOSIS — Z87891 Personal history of nicotine dependence: Secondary | ICD-10-CM

## 2021-09-11 DIAGNOSIS — I4819 Other persistent atrial fibrillation: Secondary | ICD-10-CM | POA: Diagnosis not present

## 2021-09-11 DIAGNOSIS — E871 Hypo-osmolality and hyponatremia: Secondary | ICD-10-CM | POA: Diagnosis not present

## 2021-09-11 DIAGNOSIS — T82330A Leakage of aortic (bifurcation) graft (replacement), initial encounter: Secondary | ICD-10-CM

## 2021-09-11 DIAGNOSIS — E43 Unspecified severe protein-calorie malnutrition: Secondary | ICD-10-CM

## 2021-09-11 DIAGNOSIS — I4891 Unspecified atrial fibrillation: Secondary | ICD-10-CM

## 2021-09-11 DIAGNOSIS — Z7901 Long term (current) use of anticoagulants: Secondary | ICD-10-CM

## 2021-09-11 DIAGNOSIS — J90 Pleural effusion, not elsewhere classified: Secondary | ICD-10-CM

## 2021-09-11 HISTORY — PX: ABDOMINAL AORTIC ENDOVASCULAR STENT GRAFT: SHX5707

## 2021-09-11 LAB — SURGICAL PCR SCREEN
MRSA, PCR: NEGATIVE
Staphylococcus aureus: POSITIVE — AB

## 2021-09-11 LAB — CBC
HCT: 30.3 % — ABNORMAL LOW (ref 39.0–52.0)
Hemoglobin: 9.7 g/dL — ABNORMAL LOW (ref 13.0–17.0)
MCH: 29.8 pg (ref 26.0–34.0)
MCHC: 32 g/dL (ref 30.0–36.0)
MCV: 93.2 fL (ref 80.0–100.0)
Platelets: 160 10*3/uL (ref 150–400)
RBC: 3.25 MIL/uL — ABNORMAL LOW (ref 4.22–5.81)
RDW: 17.4 % — ABNORMAL HIGH (ref 11.5–15.5)
WBC: 8.1 10*3/uL (ref 4.0–10.5)
nRBC: 0 % (ref 0.0–0.2)

## 2021-09-11 LAB — POCT I-STAT 7, (LYTES, BLD GAS, ICA,H+H)
Acid-base deficit: 1 mmol/L (ref 0.0–2.0)
Bicarbonate: 23.9 mmol/L (ref 20.0–28.0)
Calcium, Ion: 1.22 mmol/L (ref 1.15–1.40)
HCT: 28 % — ABNORMAL LOW (ref 39.0–52.0)
Hemoglobin: 9.5 g/dL — ABNORMAL LOW (ref 13.0–17.0)
O2 Saturation: 100 %
Potassium: 3.3 mmol/L — ABNORMAL LOW (ref 3.5–5.1)
Sodium: 138 mmol/L (ref 135–145)
TCO2: 25 mmol/L (ref 22–32)
pCO2 arterial: 38.8 mmHg (ref 32.0–48.0)
pH, Arterial: 7.397 (ref 7.350–7.450)
pO2, Arterial: 235 mmHg — ABNORMAL HIGH (ref 83.0–108.0)

## 2021-09-11 LAB — BASIC METABOLIC PANEL
Anion gap: 4 — ABNORMAL LOW (ref 5–15)
BUN: 10 mg/dL (ref 8–23)
CO2: 24 mmol/L (ref 22–32)
Calcium: 8.1 mg/dL — ABNORMAL LOW (ref 8.9–10.3)
Chloride: 107 mmol/L (ref 98–111)
Creatinine, Ser: 0.66 mg/dL (ref 0.61–1.24)
GFR, Estimated: >60 mL/min (ref >60)
Glucose, Bld: 106 mg/dL — ABNORMAL HIGH (ref 70–99)
Potassium: 3.3 mmol/L — ABNORMAL LOW (ref 3.5–5.1)
Sodium: 135 mmol/L (ref 135–145)

## 2021-09-11 LAB — PROTIME-INR
INR: 1.4 — ABNORMAL HIGH (ref 0.8–1.2)
Prothrombin Time: 16.7 seconds — ABNORMAL HIGH (ref 11.4–15.2)

## 2021-09-11 LAB — APTT: aPTT: 33 seconds (ref 24–36)

## 2021-09-11 LAB — POCT ACTIVATED CLOTTING TIME
Activated Clotting Time: 215 seconds
Activated Clotting Time: 257 seconds

## 2021-09-11 LAB — URINE CULTURE: Culture: NO GROWTH

## 2021-09-11 LAB — MAGNESIUM: Magnesium: 1.9 mg/dL (ref 1.7–2.4)

## 2021-09-11 LAB — PROCALCITONIN: Procalcitonin: 0.47 ng/mL

## 2021-09-11 LAB — PREPARE RBC (CROSSMATCH)

## 2021-09-11 SURGERY — INSERTION, ENDOVASCULAR STENT GRAFT, AORTA, ABDOMINAL
Anesthesia: General | Site: Groin | Laterality: Bilateral

## 2021-09-11 MED ORDER — ETOMIDATE 2 MG/ML IV SOLN
INTRAVENOUS | Status: AC
  Filled 2021-09-11: qty 10

## 2021-09-11 MED ORDER — ALUM & MAG HYDROXIDE-SIMETH 200-200-20 MG/5ML PO SUSP: 15.0000 mL | ORAL | Status: DC | PRN

## 2021-09-11 MED ORDER — CEFAZOLIN SODIUM-DEXTROSE 1-4 GM/50ML-% IV SOLN
INTRAVENOUS | Status: DC | PRN
  Administered 2021-09-11: 1 g via INTRAVENOUS

## 2021-09-11 MED ORDER — POTASSIUM CHLORIDE CRYS ER 20 MEQ PO TBCR
40.0000 meq | EXTENDED_RELEASE_TABLET | Freq: Once | ORAL | Status: AC
  Administered 2021-09-11: 17:00:00 40 meq via ORAL
  Filled 2021-09-11: qty 2

## 2021-09-11 MED ORDER — CHLORHEXIDINE GLUCONATE CLOTH 2 % EX PADS
6.0000 | MEDICATED_PAD | Freq: Every day | CUTANEOUS | Status: DC
  Administered 2021-09-14: 6 via TOPICAL

## 2021-09-11 MED ORDER — ROSUVASTATIN CALCIUM 5 MG PO TABS
5.0000 mg | ORAL_TABLET | Freq: Every day | ORAL | Status: DC
  Administered 2021-09-11 – 2021-09-17 (×6): 5 mg via ORAL
  Filled 2021-09-11 (×6): qty 1

## 2021-09-11 MED ORDER — ROCURONIUM BROMIDE 10 MG/ML (PF) SYRINGE
PREFILLED_SYRINGE | INTRAVENOUS | Status: AC
  Filled 2021-09-11: qty 10

## 2021-09-11 MED ORDER — ENOXAPARIN SODIUM 80 MG/0.8ML IJ SOSY
70.0000 mg | PREFILLED_SYRINGE | Freq: Two times a day (BID) | INTRAMUSCULAR | Status: DC
  Administered 2021-09-12 – 2021-09-17 (×10): 70 mg via SUBCUTANEOUS
  Filled 2021-09-11 (×10): qty 0.8

## 2021-09-11 MED ORDER — PHENOL 1.4 % MT LIQD: 1.0000 | OROMUCOSAL | Status: DC | PRN

## 2021-09-11 MED ORDER — DEXAMETHASONE SODIUM PHOSPHATE 10 MG/ML IJ SOLN
INTRAMUSCULAR | Status: AC
  Filled 2021-09-11: qty 1

## 2021-09-11 MED ORDER — MAGNESIUM SULFATE 2 GM/50ML IV SOLN: 2.0000 g | Freq: Every day | INTRAVENOUS | Status: DC | PRN

## 2021-09-11 MED ORDER — ESMOLOL HCL 100 MG/10ML IV SOLN
INTRAVENOUS | Status: DC | PRN
  Administered 2021-09-11 (×5): 20 mg via INTRAVENOUS

## 2021-09-11 MED ORDER — CHLORHEXIDINE GLUCONATE 0.12 % MT SOLN
OROMUCOSAL | Status: AC
  Administered 2021-09-11: 07:00:00 15 mL via OROMUCOSAL
  Filled 2021-09-11: qty 15

## 2021-09-11 MED ORDER — IODIXANOL 320 MG/ML IV SOLN
INTRAVENOUS | Status: DC | PRN
  Administered 2021-09-11: 08:00:00 60 mL

## 2021-09-11 MED ORDER — LACTATED RINGERS IV SOLN: INTRAVENOUS | Status: DC

## 2021-09-11 MED ORDER — LIDOCAINE 2% (20 MG/ML) 5 ML SYRINGE
INTRAMUSCULAR | Status: DC | PRN
  Administered 2021-09-11: 60 mg via INTRAVENOUS

## 2021-09-11 MED ORDER — LABETALOL HCL 5 MG/ML IV SOLN: 10.0000 mg | INTRAVENOUS | Status: DC | PRN

## 2021-09-11 MED ORDER — PROPOFOL 10 MG/ML IV BOLUS
INTRAVENOUS | Status: DC | PRN
  Administered 2021-09-11: 100 mg via INTRAVENOUS

## 2021-09-11 MED ORDER — LIDOCAINE 2% (20 MG/ML) 5 ML SYRINGE
INTRAMUSCULAR | Status: AC
  Filled 2021-09-11: qty 5

## 2021-09-11 MED ORDER — HEPARIN 6000 UNIT IRRIGATION SOLUTION
Status: DC | PRN
  Administered 2021-09-11: 1

## 2021-09-11 MED ORDER — SODIUM CHLORIDE 0.9% IV SOLUTION: Freq: Once | INTRAVENOUS | Status: DC

## 2021-09-11 MED ORDER — ROCURONIUM BROMIDE 10 MG/ML (PF) SYRINGE
PREFILLED_SYRINGE | INTRAVENOUS | Status: DC | PRN
  Administered 2021-09-11: 20 mg via INTRAVENOUS
  Administered 2021-09-11: 30 mg via INTRAVENOUS
  Administered 2021-09-11: 50 mg via INTRAVENOUS

## 2021-09-11 MED ORDER — SODIUM CHLORIDE 0.9 % IV SOLN: 500.0000 mL | Freq: Once | INTRAVENOUS | Status: DC | PRN

## 2021-09-11 MED ORDER — FENTANYL CITRATE (PF) 250 MCG/5ML IJ SOLN
INTRAMUSCULAR | Status: DC | PRN
  Administered 2021-09-11 (×2): 50 ug via INTRAVENOUS

## 2021-09-11 MED ORDER — FENTANYL CITRATE (PF) 100 MCG/2ML IJ SOLN: 25.0000 ug | INTRAMUSCULAR | Status: DC | PRN

## 2021-09-11 MED ORDER — SUGAMMADEX SODIUM 200 MG/2ML IV SOLN
INTRAVENOUS | Status: DC | PRN
  Administered 2021-09-11: 200 mg via INTRAVENOUS

## 2021-09-11 MED ORDER — HYDRALAZINE HCL 20 MG/ML IJ SOLN: 5.0000 mg | INTRAMUSCULAR | Status: DC | PRN

## 2021-09-11 MED ORDER — MORPHINE SULFATE (PF) 2 MG/ML IV SOLN
2.0000 mg | INTRAVENOUS | Status: DC | PRN
  Administered 2021-09-12 – 2021-09-15 (×3): 2 mg via INTRAVENOUS
  Filled 2021-09-11 (×4): qty 1

## 2021-09-11 MED ORDER — CEFAZOLIN SODIUM 1 G IJ SOLR
INTRAMUSCULAR | Status: AC
  Filled 2021-09-11: qty 10

## 2021-09-11 MED ORDER — ORAL CARE MOUTH RINSE: 15.0000 mL | Freq: Once | OROMUCOSAL | Status: AC

## 2021-09-11 MED ORDER — CHLORHEXIDINE GLUCONATE 0.12 % MT SOLN: 15.0000 mL | Freq: Once | OROMUCOSAL | Status: AC

## 2021-09-11 MED ORDER — FENTANYL CITRATE (PF) 250 MCG/5ML IJ SOLN
INTRAMUSCULAR | Status: AC
  Filled 2021-09-11: qty 5

## 2021-09-11 MED ORDER — AMISULPRIDE (ANTIEMETIC) 5 MG/2ML IV SOLN: 10.0000 mg | Freq: Once | INTRAVENOUS | Status: DC | PRN

## 2021-09-11 MED ORDER — DEXAMETHASONE SODIUM PHOSPHATE 10 MG/ML IJ SOLN
INTRAMUSCULAR | Status: DC | PRN
  Administered 2021-09-11: 4 mg via INTRAVENOUS

## 2021-09-11 MED ORDER — HEPARIN SODIUM (PORCINE) 1000 UNIT/ML IJ SOLN
INTRAMUSCULAR | Status: DC | PRN
  Administered 2021-09-11: 2000 [IU] via INTRAVENOUS
  Administered 2021-09-11: 7500 [IU] via INTRAVENOUS

## 2021-09-11 MED ORDER — POTASSIUM CHLORIDE CRYS ER 20 MEQ PO TBCR: 20.0000 meq | EXTENDED_RELEASE_TABLET | Freq: Every day | ORAL | Status: DC | PRN

## 2021-09-11 MED ORDER — 0.9 % SODIUM CHLORIDE (POUR BTL) OPTIME
TOPICAL | Status: DC | PRN
  Administered 2021-09-11: 09:00:00 1000 mL

## 2021-09-11 MED ORDER — ONDANSETRON HCL 4 MG/2ML IJ SOLN
INTRAMUSCULAR | Status: AC
  Filled 2021-09-11: qty 2

## 2021-09-11 MED ORDER — OXYCODONE-ACETAMINOPHEN 5-325 MG PO TABS
1.0000 | ORAL_TABLET | ORAL | Status: DC | PRN
  Administered 2021-09-12 – 2021-09-16 (×6): 2 via ORAL
  Administered 2021-09-17 (×2): 1 via ORAL
  Filled 2021-09-11 (×2): qty 1
  Filled 2021-09-11 (×2): qty 2
  Filled 2021-09-11: qty 1
  Filled 2021-09-11 (×2): qty 2
  Filled 2021-09-11: qty 1
  Filled 2021-09-11: qty 2

## 2021-09-11 MED ORDER — METOPROLOL TARTRATE 5 MG/5ML IV SOLN
2.0000 mg | INTRAVENOUS | Status: DC | PRN
Start: 2021-09-11 — End: 2021-09-12
  Filled 2021-09-11: qty 5

## 2021-09-11 MED ORDER — PROTAMINE SULFATE 10 MG/ML IV SOLN
INTRAVENOUS | Status: DC | PRN
  Administered 2021-09-11: 25 mg via INTRAVENOUS
  Administered 2021-09-11: 10 mg via INTRAVENOUS
  Administered 2021-09-11: 15 mg via INTRAVENOUS

## 2021-09-11 MED ORDER — PROPOFOL 10 MG/ML IV BOLUS
INTRAVENOUS | Status: AC
  Filled 2021-09-11: qty 20

## 2021-09-11 MED ORDER — ONDANSETRON HCL 4 MG/2ML IJ SOLN
INTRAMUSCULAR | Status: DC | PRN
  Administered 2021-09-11: 4 mg via INTRAVENOUS

## 2021-09-11 MED ORDER — SODIUM CHLORIDE 0.9 % IV SOLN: INTRAVENOUS | Status: DC | PRN

## 2021-09-11 MED ORDER — PROTAMINE SULFATE 10 MG/ML IV SOLN
INTRAVENOUS | Status: AC
  Filled 2021-09-11: qty 5

## 2021-09-11 MED ORDER — ASPIRIN EC 81 MG PO TBEC
81.0000 mg | DELAYED_RELEASE_TABLET | Freq: Every day | ORAL | Status: DC
  Administered 2021-09-12 – 2021-09-13 (×2): 81 mg via ORAL
  Filled 2021-09-11 (×2): qty 1

## 2021-09-11 MED ORDER — BISACODYL 10 MG RE SUPP
10.0000 mg | Freq: Every day | RECTAL | Status: DC | PRN
  Filled 2021-09-11: qty 1

## 2021-09-11 MED ORDER — DOCUSATE SODIUM 100 MG PO CAPS
100.0000 mg | ORAL_CAPSULE | Freq: Every day | ORAL | Status: DC
  Administered 2021-09-12 – 2021-09-17 (×5): 100 mg via ORAL
  Filled 2021-09-11 (×6): qty 1

## 2021-09-11 MED ORDER — PHENYLEPHRINE HCL-NACL 20-0.9 MG/250ML-% IV SOLN
INTRAVENOUS | Status: DC | PRN
  Administered 2021-09-11: 25 ug/min via INTRAVENOUS

## 2021-09-11 MED ORDER — ESMOLOL HCL 100 MG/10ML IV SOLN
INTRAVENOUS | Status: AC
  Filled 2021-09-11: qty 10

## 2021-09-11 MED ORDER — HEPARIN SODIUM (PORCINE) 1000 UNIT/ML IJ SOLN
INTRAMUSCULAR | Status: AC
  Filled 2021-09-11: qty 10

## 2021-09-11 MED ORDER — PHENYLEPHRINE 40 MCG/ML (10ML) SYRINGE FOR IV PUSH (FOR BLOOD PRESSURE SUPPORT)
PREFILLED_SYRINGE | INTRAVENOUS | Status: AC
  Filled 2021-09-11: qty 10

## 2021-09-11 SURGICAL SUPPLY — 58 items
ADH SKN CLS APL DERMABOND .7 (GAUZE/BANDAGES/DRESSINGS) ×1
BAG COUNTER SPONGE SURGICOUNT (BAG) ×2 IMPLANT
BAG SPNG CNTER NS LX DISP (BAG) ×1
BLADE CLIPPER SURG (BLADE) ×2 IMPLANT
CANISTER SUCT 3000ML PPV (MISCELLANEOUS) ×2 IMPLANT
CATH BEACON 5.038 65CM KMP-01 (CATHETERS) ×2 IMPLANT
CATH OMNI FLUSH .035X70CM (CATHETERS) ×2 IMPLANT
DERMABOND ADVANCED (GAUZE/BANDAGES/DRESSINGS) ×1
DERMABOND ADVANCED .7 DNX12 (GAUZE/BANDAGES/DRESSINGS) ×1 IMPLANT
DEVICE CLOSURE PERCLS PRGLD 6F (VASCULAR PRODUCTS) ×4 IMPLANT
DEVICE TORQUE H2O (MISCELLANEOUS) ×2 IMPLANT
DEVICE TORQUE KENDALL .025-038 (MISCELLANEOUS) IMPLANT
DRSG TEGADERM 2-3/8X2-3/4 SM (GAUZE/BANDAGES/DRESSINGS) ×4 IMPLANT
DRYSEAL FLEXSHEATH 12FR 33CM (SHEATH) ×1
DRYSEAL FLEXSHEATH 16FR 33CM (SHEATH) ×1
ELECT REM PT RETURN 9FT ADLT (ELECTROSURGICAL)
ELECTRODE REM PT RTRN 9FT ADLT (ELECTROSURGICAL) IMPLANT
EXCLDR TRNK ENDO 26X14.5X12 16 (Endovascular Graft) ×2 IMPLANT
EXCLUDER TNK END 26X14.5X12 16 (Endovascular Graft) ×1 IMPLANT
GAUZE 4X4 16PLY ~~LOC~~+RFID DBL (SPONGE) ×2 IMPLANT
GAUZE SPONGE 2X2 8PLY STRL LF (GAUZE/BANDAGES/DRESSINGS) ×2 IMPLANT
GLIDEWIRE ADV .035X180CM (WIRE) ×2 IMPLANT
GLOVE SRG 8 PF TXTR STRL LF DI (GLOVE) ×1 IMPLANT
GLOVE SURG ENC MOIS LTX SZ7.5 (GLOVE) ×2 IMPLANT
GLOVE SURG POLY ORTHO LF SZ7.5 (GLOVE) IMPLANT
GLOVE SURG UNDER LTX SZ8 (GLOVE) ×2 IMPLANT
GLOVE SURG UNDER POLY LF SZ8 (GLOVE) ×2
GOWN STRL REUS W/ TWL LRG LVL3 (GOWN DISPOSABLE) ×3 IMPLANT
GOWN STRL REUS W/TWL LRG LVL3 (GOWN DISPOSABLE) ×6
GRAFT BALLN CATH 65CM (STENTS) ×1 IMPLANT
GUIDEWIRE ANGLED .035X150CM (WIRE) IMPLANT
KIT BASIN OR (CUSTOM PROCEDURE TRAY) ×2 IMPLANT
KIT TURNOVER KIT B (KITS) ×2 IMPLANT
LEG CONTRALATERAL 16X16X13.5 (Endovascular Graft) ×2 IMPLANT
LEG CONTRALETERAL16X16X11.5 (Endovascular Graft) ×2 IMPLANT
NEEDLE PERC 18GX7CM (NEEDLE) IMPLANT
NS IRRIG 1000ML POUR BTL (IV SOLUTION) ×2 IMPLANT
PACK ENDOVASCULAR (PACKS) ×2 IMPLANT
PAD ARMBOARD 7.5X6 YLW CONV (MISCELLANEOUS) ×4 IMPLANT
PERCLOSE PROGLIDE 6F (VASCULAR PRODUCTS) ×8
SET MICROPUNCTURE 5F STIFF (MISCELLANEOUS) ×2 IMPLANT
SHEATH BRITE TIP 8FR 23CM (SHEATH) ×2 IMPLANT
SHEATH DRYSEAL FLEX 12FR 33CM (SHEATH) ×1 IMPLANT
SHEATH DRYSEAL FLEX 16FR 33CM (SHEATH) ×1 IMPLANT
SHEATH PINNACLE 8F 10CM (SHEATH) ×2 IMPLANT
SPONGE GAUZE 2X2 STER 10/PKG (GAUZE/BANDAGES/DRESSINGS) ×2
STENT GRAFT BALLN CATH 65CM (STENTS) ×1
STENT GRAFT CONTRALAT 16X11.5 (Endovascular Graft) ×1 IMPLANT
STENT GRAFT CONTRALAT 16X13.5 (Endovascular Graft) ×1 IMPLANT
STOPCOCK MORSE 400PSI 3WAY (MISCELLANEOUS) ×2 IMPLANT
SUT MNCRL AB 4-0 PS2 18 (SUTURE) ×4 IMPLANT
SUT PROLENE 5 0 C 1 24 (SUTURE) IMPLANT
SYR 20ML LL LF (SYRINGE) ×2 IMPLANT
TOWEL GREEN STERILE (TOWEL DISPOSABLE) ×2 IMPLANT
TRAY FOLEY MTR SLVR 16FR STAT (SET/KITS/TRAYS/PACK) ×2 IMPLANT
TUBING HIGH PRESSURE 120CM (CONNECTOR) ×2 IMPLANT
WIRE AMPLATZ SS-J .035X180CM (WIRE) ×4 IMPLANT
WIRE BENTSON .035X145CM (WIRE) ×4 IMPLANT

## 2021-09-11 NOTE — Discharge Instructions (Signed)
  [facility]   Discharge Instructions  Endovascular Aortic Aneurysm Repair  Please refer to the following instructions for your post-procedure care. Your surgeon or Physician Assistant will discuss any changes with you.  Activity  You are encouraged to walk as much as you can. You can slowly return to normal activities but must avoid strenuous activity and heavy lifting until your doctor tells you it's OK. Avoid activities such as vacuuming or swinging a gold club. It is normal to feel tired for several weeks after your surgery. Do not drive until your doctor gives the OK and you are no longer taking prescription pain medications. It is also normal to have difficulty with sleep habits, eating, and bowel movements after surgery. These will go away with time.  Bathing/Showering  Shower daily after you go home.  Do not soak in a bathtub, hot tub, or swim until the incision heals completely.  If you have incisions in your groin, wash the groin wounds with soap and water daily and pat dry. (No tub bath-only shower)  Then put a dry gauze or washcloth there to keep this area dry to help prevent wound infection daily and as needed.  Do not use Vaseline or neosporin on your incisions.  Only use soap and water on your incisions and then protect and keep dry.  Incision Care  Shower every day. Clean your incision with mild soap and water. Pat the area dry with a clean towel. You do not need a bandage unless otherwise instructed. Do not apply any ointments or creams to your incision. If you clothing is irritating, you may cover your incision with a dry gauze pad.  Diet  Resume your normal diet. There are no special food restrictions following this procedure. A low fat/low cholesterol diet is recommended for all patients with vascular disease. In order to heal from your surgery, it is CRITICAL to get adequate nutrition. Your body requires vitamins, minerals, and protein.  Vegetables are the best source of vitamins and minerals. Vegetables also provide the perfect balance of protein. Processed food has little nutritional value, so try to avoid this.  Medications  Resume taking all of your medications unless your doctor or nurse practitioner tells you not to. If your incision is causing pain, you may take over-the-counter pain relievers such as acetaminophen (Tylenol). If you were prescribed a stronger pain medication, please be aware these medications can cause nausea and constipation. Prevent nausea by taking the medication with a snack or meal. Avoid constipation by drinking plenty of fluids and eating foods with a high amount of fiber, such as fruits, vegetables, and grains.  Do not take Tylenol if you are taking prescription pain medications.   Follow up  Our office will schedule a follow-up appointment with a CT scan 3-4 weeks after your surgery.  Please call us immediately for any of the following conditions  Severe or worsening pain in your legs or feet or in your abdomen back or chest. Increased pain, redness, drainage (pus) from your incision site. Increased abdominal pain, bloating, nausea, vomiting or persistent diarrhea. Fever of 101 degrees or higher. Swelling in your leg (s),  Reduce your risk of vascular disease  Stop smoking. If you would like help call [facility] at 1-800-QUIT-NOW (1-800-784-8669) or [facility] at 336-586-4000. Manage your cholesterol Maintain a desired weight Control your diabetes Keep your blood pressure down  If you have questions, please call the office at [phone].  

## 2021-09-11 NOTE — Anesthesia Preprocedure Evaluation (Signed)
Anesthesia Evaluation  Patient identified by MRN, date of birth, ID band Patient awake    Reviewed: Allergy & Precautions, NPO status , Patient's Chart, lab work & pertinent test results  Airway Mallampati: II  TM Distance: >3 FB Neck ROM: Full    Dental  (+) Dental Advisory Given   Pulmonary former smoker,  Lung CA   breath sounds clear to auscultation       Cardiovascular + Peripheral Vascular Disease  + dysrhythmias Atrial Fibrillation  Rhythm:Irregular Rate:Normal     Neuro/Psych negative neurological ROS     GI/Hepatic negative GI ROS, Neg liver ROS,   Endo/Other  negative endocrine ROS  Renal/GU negative Renal ROS     Musculoskeletal   Abdominal   Peds  Hematology  (+) anemia ,   Anesthesia Other Findings   Reproductive/Obstetrics                             Lab Results  Component Value Date   WBC 9.1 09/10/2021   HGB 8.0 (L) 09/10/2021   HCT 25.0 (L) 09/10/2021   MCV 96.2 09/10/2021   PLT 222 09/10/2021   Lab Results  Component Value Date   CREATININE 0.76 09/10/2021   BUN 17 09/10/2021   NA 131 (L) 09/10/2021   K 4.1 09/10/2021   CL 105 09/10/2021   CO2 23 09/10/2021    Anesthesia Physical Anesthesia Plan  ASA: 4  Anesthesia Plan: General   Post-op Pain Management: Minimal or no pain anticipated   Induction: Intravenous  PONV Risk Score and Plan: 2 and Dexamethasone, Ondansetron and Treatment may vary due to age or medical condition  Airway Management Planned: Oral ETT  Additional Equipment: Arterial line  Intra-op Plan:   Post-operative Plan: Extubation in OR  Informed Consent: I have reviewed the patients History and Physical, chart, labs and discussed the procedure including the risks, benefits and alternatives for the proposed anesthesia with the patient or authorized representative who has indicated his/her understanding and acceptance.      Dental advisory given  Plan Discussed with: CRNA  Anesthesia Plan Comments:         Anesthesia Quick Evaluation

## 2021-09-11 NOTE — Transfer of Care (Signed)
Immediate Anesthesia Transfer of Care Note  Patient: Leon Lyons  Procedure(s) Performed: EVAR (Bilateral: Groin)  Patient Location: PACU  Anesthesia Type:General  Level of Consciousness: drowsy and patient cooperative  Airway & Oxygen Therapy: Patient Spontanous Breathing and Patient connected to face mask oxygen  Post-op Assessment: Report given to RN and Post -op Vital signs reviewed and stable  Post vital signs: Reviewed and stable  Last Vitals:  Vitals Value Taken Time  BP 99/58 09/11/21 0943  Temp    Pulse 86 09/11/21 0951  Resp 21 09/11/21 0951  SpO2 97 % 09/11/21 0951  Vitals shown include unvalidated device data.  Last Pain:  Vitals:   09/11/21 0709  TempSrc: Oral  PainSc: 0-No pain      Patients Stated Pain Goal: 0 (27/61/84 8592)  Complications: No notable events documented.

## 2021-09-11 NOTE — Consult Note (Addendum)
ASSESSMENT & PLAN   CONTAINED RUPTURE OF AORTIC BIFURCATION: This patient CT scan at [facility] tonight shows a contained leak at the aortic bifurcation that was not present on the scan in October of this year.  Although he is asymptomatic and this bleed appears to be contained currently I think he is at risk for progression and rupture.  This would clearly be a life-threatening situation.  Despite his age and history of cancer he appears to be fairly active and wishes to proceed with repair.  Fortunately, I think we can do this with an endovascular graft as I do not think he would be a good candidate for open repair given his age and recent abdominal surgery.  In addition he has significant calcific disease in his aorta which would make this very high risk.  However based on his anatomy and again I think we can do this with an endovascular graft.  His hemoglobin is 8 and I think he should be transfused preoperatively which I have ordered.  He is on Lovenox for atrial fibrillation and according to his wife his last dose was Friday morning.  We will proceed later this morning once he has had blood.  I have discussed the procedure and potential complications with the patient and his wife over the phone.  I think the main risk is he does have some significant calcific disease in his left common iliac artery especially so there is some risk of access complications such as bleeding or arterial injury.  All of his questions were answered and he is agreeable to proceed.  REASON FOR CONSULT:    Contained leak of the aortic bifurcation.  The consult is requested by Dr. Zigmund Daniel.  HPI:   Leon Lyons is a 87 y.o. adult who was at [facility] where he was admitted because of generalized weakness.  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  He has some known abdominal cysts which have been drained by radiology.  His medical history is also  significant for a history of a right lower extremity DVT, history of a pulmonary embolus, and a history of non-small cell lung cancer.  He has evidence of metastatic disease to the bones and most recent CT scan suggests metastatic disease to the liver.  On my history the patient denies any sudden onset abdominal pain.  His only complaint has been some generalized weakness.  He states he has been afebrile.  His wife has been giving him Lovenox shots for A. fib and his last dose was Friday morning.  The patient denies any history of diabetes, hypertension, hypercholesterolemia, family history of premature cardiovascular disease, or tobacco use.  He denies any history of myocardial infarction or history of congestive heart failure.  He has had no recent chest pain.  Past Medical History:  Diagnosis Date   History of radiation therapy 03/24/2021   left lung   02/11/2021-03/24/2021  Dr Gery Pray   Lung cancer [facility])    Tobacco use     Family History  Problem Relation Age of Onset   Hypertension Other     SOCIAL HISTORY: Social History   Tobacco Use   Smoking status: Former    Years: 5.00    Types: Cigarettes    Quit date: 1962    Years since quitting: 60.9   Smokeless tobacco: Never   Tobacco comments:    light/occasional smoker back in early  1960's  Substance Use Topics   Alcohol use: Not on file    Allergies  Allergen Reactions   Diclofenac Sodium Other (See Comments)    Kidneys were affected    Current Facility-Administered Medications  Medication Dose Route Frequency Provider Last Rate Last Admin   (feeding supplement) PROSource Plus liquid 30 mL  30 mL Oral BID Reubin Milan, MD   30 mL at 09/10/21 0945   0.9 %  sodium chloride infusion   Intravenous Continuous Georgette Shell, MD 125 mL/hr at 09/11/21 0132 New Bag at 09/11/21 0132   acetaminophen (TYLENOL) tablet 650 mg  650 mg Oral Q6H PRN Reubin Milan, MD       Or   acetaminophen (TYLENOL)  suppository 650 mg  650 mg Rectal Q6H PRN Reubin Milan, MD       albumin human 5 % solution 25 g  25 g Intravenous Q6H Georgette Shell, MD 60 mL/hr at 09/11/21 0127 25 g at 09/11/21 0127   ceFAZolin (ANCEF) IVPB 2g/100 mL premix  2 g Intravenous Q8H Blount, Xenia T, NP 200 mL/hr at 09/10/21 2222 2 g at 09/10/21 2222   cyclobenzaprine (FLEXERIL) tablet 5 mg  5 mg Oral TID PRN Georgette Shell, MD       diltiazem (CARDIZEM) tablet 120 mg  120 mg Oral Daily Reubin Milan, MD   120 mg at 60/63/01 6010   folic acid (FOLVITE) tablet 1 mg  1 mg Oral Daily Reubin Milan, MD   1 mg at 09/10/21 0945   guaiFENesin-dextromethorphan (ROBITUSSIN DM) 100-10 MG/5ML syrup 5 mL  5 mL Oral Q6H PRN Reubin Milan, MD       ondansetron [facility]) tablet 4 mg  4 mg Oral Q6H PRN Reubin Milan, MD       Or   ondansetron [facility]) injection 4 mg  4 mg Intravenous Q6H PRN Reubin Milan, MD       pantoprazole (PROTONIX) EC tablet 40 mg  40 mg Oral Daily Reubin Milan, MD   40 mg at 09/10/21 0945   polyethylene glycol (MIRALAX / GLYCOLAX) packet 17 g  17 g Oral Daily PRN Reubin Milan, MD       sodium chloride (OCEAN) 0.65 % nasal spray 1 spray  1 spray Each Nare Once Reubin Milan, MD        REVIEW OF SYSTEMS:  [X]  denotes positive finding, [ ]  denotes negative finding Cardiac  Comments:  Chest pain or chest pressure:    Shortness of breath upon exertion:    Short of breath when lying flat:    Irregular heart rhythm:        Vascular    Pain in calf, thigh, or hip brought on by ambulation:    Pain in feet at night that wakes you up from your sleep:     Blood clot in your veins:    Leg swelling:         Pulmonary    Oxygen at home:    Productive cough:     Wheezing:         Neurologic    Sudden weakness in arms or legs:     Sudden numbness in arms or legs:     Sudden onset of difficulty speaking or slurred speech:    Temporary loss of vision in one  eye:     Problems with dizziness:         Gastrointestinal  Blood in stool:     Vomited blood:         Genitourinary    Burning when urinating:     Blood in urine:        Psychiatric    Major depression:         Hematologic    Bleeding problems:    Problems with blood clotting too easily:        Skin    Rashes or ulcers:        Constitutional    Fever or chills:    -  PHYSICAL EXAM:   Vitals:   09/10/21 2252 09/10/21 2301 09/11/21 0001 09/11/21 0002  BP:  (!) 114/56 120/71   Pulse:  (!) 101 (!) 105   Resp:  19    Temp: 98.8 F (37.1 C) 98.1 F (36.7 C) 98.2 F (36.8 C)   TempSrc: Oral Oral Oral   SpO2:  97%    Weight:    75.3 kg  Height:    5\' 10"  (1.778 m)   Body mass index is 23.82 kg/m. GENERAL: The patient is a well-nourished adult, in no acute distress. The vital signs are documented above. CARDIAC: There is a regular rate and rhythm.  VASCULAR: I do not detect carotid bruits. He has palpable femoral pulses and palpable dorsalis pedis pulses bilaterally. He has bilateral lower extremity swelling which is more significant on the right side. PULMONARY: There is good air exchange bilaterally without wheezing or rales. ABDOMEN: Soft and non-tender with normal pitched bowel sounds.  I do not palpate an aneurysm. MUSCULOSKELETAL: There are no major deformities. NEUROLOGIC: No focal weakness or paresthesias are detected. SKIN: There are no ulcers or rashes noted. PSYCHIATRIC: The patient has a normal affect.  DATA:    CT ANGIO ABDOMEN PELVIS: This shows a contained aortic leak at the aortic bifurcation with periaortic hematoma.  This is new since the previous CT scan on 07/30/2021.  He has significant extensive aortoiliac atherosclerotic calcification.  He is also noted to have a right common femoral DVT extending into the common iliac vein.  In addition, the cysts of the abdomen appear to be smaller compared to previous studies.  He appears to have multiple  small suspected hepatic metastases.  LABS: His creatinine is 0.76.  GFR is greater than 60. White blood cell count is 9.1.  Hemoglobin 8.0.  Hematocrit 25.0.  Platelets 222,000.  EKG: This shows A. fib.  Deitra Mayo [facility]

## 2021-09-11 NOTE — Progress Notes (Signed)
Pt tried to sit up on the side of his bed and his heart rate promptly rose to the 150s-160s.  Pt was returned to supine and heart rate returned to around 100.  Will continue to monitor.

## 2021-09-11 NOTE — Anesthesia Procedure Notes (Signed)
Procedure Name: Intubation Date/Time: 09/11/2021 7:47 AM Performed by: Renato Shin, CRNA Pre-anesthesia Checklist: Patient identified, Emergency Drugs available, Suction available and Patient being monitored Patient Re-evaluated:Patient Re-evaluated prior to induction Oxygen Delivery Method: Circle system utilized Preoxygenation: Pre-oxygenation with 100% oxygen Induction Type: IV induction Ventilation: Mask ventilation without difficulty Laryngoscope Size: Miller and 3 Grade View: Grade I Tube type: Oral Tube size: 7.5 mm Number of attempts: 1 Airway Equipment and Method: Stylet and Oral airway Placement Confirmation: ETT inserted through vocal cords under direct vision, positive ETCO2 and breath sounds checked- equal and bilateral Secured at: 22 cm Tube secured with: Tape Dental Injury: Teeth and Oropharynx as per pre-operative assessment

## 2021-09-11 NOTE — Progress Notes (Signed)
Patient arrived to the floor via care link, alert and oriented. Patient oriented to staff, room and equipment. Cardiac monitoring initiated, pt in AFIB. [facility] MD notified. We'll continue to monitor.

## 2021-09-11 NOTE — Anesthesia Procedure Notes (Signed)
Arterial Line Insertion Start/End12/01/2021 7:15 AM, 09/11/2021 7:20 AM Performed by: CRNA  Patient location: Pre-op. Preanesthetic checklist: patient identified, IV checked, site marked, risks and benefits discussed, surgical consent, monitors and equipment checked, pre-op evaluation, timeout performed and anesthesia consent Lidocaine 1% used for infiltration Right, radial was placed Catheter size: 20 G Hand hygiene performed , maximum sterile barriers used  and Seldinger technique used Allen's test indicative of satisfactory collateral circulation Attempts: 1 Procedure performed without using ultrasound guided technique. Following insertion, dressing applied and Biopatch. Post procedure assessment: normal  Patient tolerated the procedure well with no immediate complications.

## 2021-09-11 NOTE — Anesthesia Postprocedure Evaluation (Addendum)
Anesthesia Post Note  Patient: Leon Lyons Beg  Procedure(s) Performed: EVAR (Bilateral: Groin)     Patient location during evaluation: PACU Anesthesia Type: General Level of consciousness: awake and alert, responds to stimulation and patient cooperative Pain management: pain level controlled Vital Signs Assessment: post-procedure vital signs reviewed and stable Respiratory status: spontaneous breathing, nonlabored ventilation, respiratory function stable and patient connected to nasal cannula oxygen Cardiovascular status: blood pressure returned to baseline and stable Postop Assessment: no apparent nausea or vomiting Anesthetic complications: no   No notable events documented.  Last Vitals:  Vitals:   09/11/21 0958 09/11/21 1013  BP: (!) 116/59 107/62  Pulse: 90 95  Resp: (!) 28 19  Temp:    SpO2: 98% 97%    Last Pain:  Vitals:   09/11/21 1013  TempSrc:   PainSc: 0-No pain                 Tiajuana Amass

## 2021-09-11 NOTE — Progress Notes (Signed)
ANTICOAGULATION CONSULT NOTE - Initial Consult  Pharmacy Consult for Enoxaparin Indication: atrial fibrillation and pulmonary embolus  Allergies  Allergen Reactions   Diclofenac Sodium Other (See Comments)    Kidneys were affected    Patient Measurements: Height: 5\' 10"  (177.8 cm) Weight: 75.3 kg (166 lb 0.1 oz) IBW/kg (Calculated) : 73  Vital Signs: Temp: 97.9 F (36.6 C) (12/04 1159) Temp Source: Oral (12/04 1159) BP: 116/62 (12/04 1200) Pulse Rate: 93 (12/04 1159)  Labs: Recent Labs    09/09/21 1248 09/10/21 0510 09/10/21 0833 09/11/21 0830 09/11/21 0957  HGB 7.4*  --  8.0* 9.5* 9.7*  HCT 24.0*  --  25.0* 28.0* 30.3*  PLT 244  --  222  --  160  APTT 36  --   --   --  33  LABPROT 15.0  --   --   --  16.7*  INR 1.2  --   --   --  1.4*  CREATININE 0.85 0.76  --   --  0.66    Estimated Creatinine Clearance: 68.4 mL/min (by C-G formula based on SCr of 0.66 mg/dL).   Medical History: Past Medical History:  Diagnosis Date   History of radiation therapy 03/24/2021   left lung   02/11/2021-03/24/2021  Dr Gery Pray   Lung cancer [facility])    Tobacco use     Assessment: 87 yo male admitted on 09/09/2021 and transferred to [facility] for contained aortic leak s/p repair on 09/11/2021. Patient is on enoxaparin 70 mg SQ BID prior to admission for afib and history of PE. Pharmacy consulted to resume enoxaparin on 09/12/2021. CrCl ~68 ml/min.   Goal of Therapy:  Anti-Xa level 0.6-1 units/ml 4hrs after LMWH dose given Monitor platelets by anticoagulation protocol: Yes   Plan:  Start enoxaparin 70mg  twice daily on 09/12/2021 Obtain anti-Xa levels as appropriate  Monitor renal function, CBC and s/s of bleeding   Cristela Felt, PharmD, BCPS Clinical Pharmacist 09/11/2021 12:08 PM

## 2021-09-11 NOTE — Op Note (Addendum)
NAME: Leon Lyons    MRN: [id number] DOB: April 08, 1933    DATE OF OPERATION: 09/11/2021  PREOP DIAGNOSIS:    Contained aortic leak at aortic bifurcation, with small abdominal aortic aneurysm  POSTOP DIAGNOSIS:    Same  PROCEDURE:    Percutaneous endovascular aneurysm repair (3 components - Gore Conformable Excluder ) Preclosure of bilateral common femoral arteries  SURGEON: Judeth Cornfield. Scot Dock, MD  ASSIST: Liana Crocker, PA  ANESTHESIA: General  EBL: Minimal  INDICATIONS:    Leon Lyons is a 87 y.o. male who underwent a CT scan which showed a contained leak at the aortic bifurcation with surrounding hematoma.  This was not present on scan in October.  He had a small abdominal aortic aneurysm.  Given this finding I recommended repair given the risk of rupture.   FINDINGS:   The aneurysm was repaired with 3 components.  Postop arteriogram showed an excellent result with the graft in good position and no evidence of type I or type II endoleak  TECHNIQUE:   The patient was taken to the operating room and received a general anesthetic.  Arterial line had been placed by anesthesia.  The abdomen both groins were prepped and draped in usual sterile fashion.  On the right side under ultrasound guidance, I cannulated the right common femoral artery with a micropuncture needle and a micropuncture sheath was introduced over a wire.  This was done under fluoroscopic guidance.  I then advanced a Bentson wire into the infrarenal aorta.  The tract over the wire was dilated with an 8 [place] dilator and then 2 Perclose devices were placed.  The initial device was rotated 15 degrees medially and the second device rotated 15 degrees laterally.  I then placed a short 8 French sheath on the right.  Likewise, on the left side, under ultrasound guidance, I cannulated the left common femoral artery with a micropuncture needle and a micropuncture sheath was introduced over a wire.  This  was done under fluoroscopic guidance.  I then advanced a Bentson wire into the infrarenal aorta.  The tract over the wire was dilated with an 8 [place] dilator and then 2 Perclose devices were placed.  Initial device was rotated 50 degrees medially and the second device rotated 15 degrees laterally.  I then placed a long 8 French sheath on the left.  On the right side I exchanged the Bentson wire for an Amplatz wire and carefully checked the distal wire position and then marked the wire on the back table.  Likewise a Amplatz wire was placed on the left side begin checking the position of the wire distally in the aortic arch and marking wire on the back table.  The 8 French sheath on the right was exchanged for a 16 [place] sheath.  The 8 French sheath on the left was exchanged for a 12 [place] sheath.  I then put the pigtail up the left side.  The trunk ipsi component was a 26 mm x 14 mm x 12 cm device which was positioned up the right femoral sheath with the gait crossed.  This was positioned and then an arteriogram obtained to demonstrate the position of the renal arteries.  The graft was then deployed in position right below the renal arteries.  Next the contralateral gate was cannulated.  Up with the Amplatz wire up the pigtail catheter to use for support and then passed the Bentson wire through the sheath.  Using a Kumpe catheter and  Glidewire advantage I was able to cannulate the gate.  Position was confirmed by placing a pigtail catheter within the graft and turning the pigtail catheter.  And RAO projection was then obtained to demonstrate the position of the right hypogastric artery.  The left iliac limb was a 16 mm x 13.5 cm device which was deployed without difficulty preserving the hypogastric artery on the left.  The truncates he was then completely deployed and then on the right side an extension limb was placed which was 16 mm x 11.5 cm.  I then ballooned all of the areas of concern including the  proximal graft distal graft and overlapped regions.  A pigtail catheter was then placed up the left side and a completion arteriogram was obtained which showed the graft in excellent position with no type I or type II endoleak's.  I then exchanged the Amplatz wire for a Bentson wire on each side.  On the left side the sheath was retracted and pressure held while the Perclose devices were secured.  There was good hemostasis of the wire was removed.  The sutures were then further secured.  Likewise on the right side the sheath was removed and the Perclose devices secured over the Bentson wire.  There was good hemostasis of the Bentson wire was removed.  I then further secured the sutures.  At the completion there was good hemostasis.  The sutures were cut and a 4-0 Monocryl was placed in each groin.  Patient tolerated procedure well was transferred to recovery room in stable condition.  All needle and sponge counts were correct.  The patient had brisk anterior tibial and posterior tibial signals in both lower extremities at the completion of the procedure.   Given the complexity of the case a first assistant was necessary in order to expedient the procedure and safely perform the technical aspects of the operation.  Deitra Mayo, MD, FACS [facility]  DATE OF DICTATION:   09/11/2021

## 2021-09-11 NOTE — Progress Notes (Signed)
PROGRESS NOTE    SHER HELLINGER  ION:[id number] DOB: 05/26/1933 DOA: 09/09/2021 PCP: [name], MD    Brief Narrative:  Mr. [name] was admitted to the hospital with working diagnosis of symptomatic anemia.  87 yo male with the past medical history of lung cancer, with recent diagnosis of atrial fibrillation and pulmonary embolism, after a abdominal surgery for volvulus.  Patient reported generalized weakness and episodic dyspnea on exertion. On his initial physical examination his temp was 100 F, HR 11, RR 22 and BP 119/72 with 02 saturation 96%. He was pale, lungs clear to auscultation, heart with S1 and S2 present, irregularly irregular, abdomen soft and non tender, no lower extremity edema.   NA 129, K 3,7, Cl 98, bicarbonate 25, glucose 130, BUN 21 and serum cr at 0.85 Wbc 10,5, hgb 7,4, hct 24 and platelets 244.  Sars COVID 19 negative  UA with specific gravity 1.012  Chest film with left upper lobe interstitial infiltrate and left pleural effusion Chest CT with left perihilar post radiation changes, with fibrosis, loss of lung volume, and ground glass opacities.  Right lower lobe pulmonary embolism with pulmonary infarction.  Right 5th rib metastatic lesions.   CT abdomen wit right ileofemoral DVT. Contained aortic leak at the aortic bifurcation with periarotic hematoma.   EKG 135 bpm, left axis deviation, right bundle branch block, atrial fibrillation rhythm, no significant ST segment or T wave changes.   Patient had 1 unit PRBC transfusion and GI was consulted. Suspected bleeding from surgical site at the volvulus anastomosis and conservative care was recommended.   Blood cultures positive for staph aureus and patient was placed on cefazolin.   12/04 patient was transferred to [facility] from [facility] for vascular evaluation for aortic leak.  Received PRBC transfusion and underwent percutaneous endovascular aneurysm repair. 09/11/21.   Assessment & Plan:   Principal  Problem:   Symptomatic anemia Active Problems:   Non-small cell carcinoma of left lung, stage 3 (HCC)   Pleural effusion on left   Hyponatremia   Persistent atrial fibrillation (HCC)   Pulmonary embolism (HCC)   Protein-calorie malnutrition, severe (HCC)   Pleural effusion   Acute symptomatic anemia, due to surgical colonic anastomosis.  Follow up hgb this am is 9,7, sp total of 3 units PRBC transfusion since admission.   2. Aortic leak (contained) now sp endovascular repair.  Patient tolerated procedure well. Continue post op care per vascular surgery He has a arterial line in place for close blood pressure monitoring.  3. Atrial fibrillation. Continue rate control with oral diltiazem and as needed IV metoprolol. Continue anticoagulation with enoxaparin   4. Pulmonary embolism. Right ileofemoral DVT. Patient now on enoxaparin for anticoagulation. Continue close monitoring oxygenation.   5. Staphylococcus bacteremia. Continue antibiotic therapy with cefazolin and follow with echocardiogram report.   6. Stage 3 non small cell lung cancer.   Patient will follow up as outpatient with Dr Julien Nordmann.   7. HTN continue close blood pressure monitoring.   8. Hyponatremia and hypokalemia. Renal function with serum cr at 0,66, K is 3,3 and serum bicarbonate at 24. Add 40 meq Kcl and follow up renal function in am.  9. Calorie protein malnutrition consult nutrition for further recommendations, nutritional supplements.   Patient continue to be at high risk for worsening anemia   Status is: Inpatient  Remains inpatient appropriate because: hemodynamic monitoring     DVT prophylaxis:  Enoxaparin   Code Status:    full  Family Communication:  I spoke with patient's wife  at the bedside, we talked in detail about patient's condition, plan of care and prognosis and all questions were addressed.     Consultants:  ID Vascular surgery   Procedures:  Aortic leak endovascular  repair  Antimicrobials:  Cefazolin     Subjective: Patient with no nausea or vomiting, no chest pain or dyspnea,   Objective: Vitals:   09/11/21 1159 09/11/21 1200 09/11/21 1239 09/11/21 1300  BP: (!) 113/56 116/62 104/67 (!) 112/59  Pulse: 93  84   Resp:  20 20 18   Temp: 97.9 F (36.6 C)     TempSrc: Oral     SpO2:  96%  93%  Weight:      Height:        Intake/Output Summary (Last 24 hours) at 09/11/2021 1417 Last data filed at 09/11/2021 0920 Gross per 24 hour  Intake 3569.85 ml  Output 1125 ml  Net 2444.85 ml   Filed Weights   09/09/21 2112 09/10/21 0954 09/11/21 0002  Weight: 72 kg 74.8 kg 75.3 kg    Examination:   General: Not in pain or dyspnea, deconditioned  Neurology: Awake and alert, non focal  E ENT: no pallor, no icterus, oral mucosa moist Cardiovascular: No JVD. S1-S2 present, irregularly irregular with no gallops, rubs, or murmurs. No lower extremity edema. Pulmonary: positive breath sounds bilaterally, adequate air movement, no wheezing, rhonchi or rales. Gastrointestinal. Abdomen soft and non tender Skin. No rashes Musculoskeletal: no joint deformities     Data Reviewed: I have personally reviewed following labs and imaging studies  CBC: Recent Labs  Lab 09/09/21 1248 09/10/21 0833 09/11/21 0830 09/11/21 0957  WBC 10.5 9.1  --  8.1  NEUTROABS 9.0*  --   --   --   HGB 7.4* 8.0* 9.5* 9.7*  HCT 24.0* 25.0* 28.0* 30.3*  MCV 98.0 96.2  --  93.2  PLT 244 222  --  196   Basic Metabolic Panel: Recent Labs  Lab 09/09/21 1248 09/10/21 0510 09/11/21 0830 09/11/21 0957  NA 129* 131* 138 135  K 3.7 4.1 3.3* 3.3*  CL 98 105  --  107  CO2 25 23  --  24  GLUCOSE 130* 87  --  106*  BUN 21 17  --  10  CREATININE 0.85 0.76  --  0.66  CALCIUM 8.3* 7.8*  --  8.1*  MG  --   --   --  1.9  PHOS  --  3.0  --   --    GFR: Estimated Creatinine Clearance: 68.4 mL/min (by C-G formula based on SCr of 0.66 mg/dL). Liver Function Tests: Recent Labs   Lab 09/09/21 1248 09/10/21 0510  AST 14*  --   ALT 13  --   ALKPHOS 83  --   BILITOT 0.3  --   PROT 6.6  --   ALBUMIN 1.8* 1.6*   No results for input(s): LIPASE, AMYLASE in the last 168 hours. No results for input(s): AMMONIA in the last 168 hours. Coagulation Profile: Recent Labs  Lab 09/09/21 1248 09/11/21 0957  INR 1.2 1.4*   Cardiac Enzymes: No results for input(s): CKTOTAL, CKMB, CKMBINDEX, TROPONINI in the last 168 hours. BNP (last 3 results) No results for input(s): PROBNP in the last 8760 hours. HbA1C: No results for input(s): HGBA1C in the last 72 hours. CBG: No results for input(s): GLUCAP in the last 168 hours. Lipid Profile: No results for input(s): CHOL, HDL, LDLCALC, TRIG, CHOLHDL, LDLDIRECT  in the last 72 hours. Thyroid Function Tests: No results for input(s): TSH, T4TOTAL, FREET4, T3FREE, THYROIDAB in the last 72 hours. Anemia Panel: Recent Labs    09/09/21 1248 09/09/21 1721  VITAMINB12  --  667  FOLATE  --  5.2*  FERRITIN  --  531*  TIBC  --  128*  IRON  --  14*  RETICCTPCT 2.1  --       Radiology Studies: I have reviewed all of the imaging during this hospital visit personally     Scheduled Meds:  (feeding supplement) PROSource Plus  30 mL Oral BID   sodium chloride   Intravenous Once   [START ON 09/12/2021] aspirin EC  81 mg Oral Q0600   Chlorhexidine Gluconate Cloth  6 each Topical Daily   diltiazem  120 mg Oral Daily   [START ON 09/12/2021] docusate sodium  100 mg Oral Daily   [START ON 09/12/2021] enoxaparin (LOVENOX) injection  70 mg Subcutaneous R49T   folic acid  1 mg Oral Daily   pantoprazole  40 mg Oral Daily   rosuvastatin  5 mg Oral Daily   sodium chloride  1 spray Each Nare Once   Continuous Infusions:  sodium chloride 125 mL/hr at 09/11/21 1235   sodium chloride      ceFAZolin (ANCEF) IV 2 g (09/11/21 0506)   magnesium sulfate bolus IVPB       LOS: 1 day        Lyrick Lagrand Gerome Apley, MD

## 2021-09-12 DIAGNOSIS — I4819 Other persistent atrial fibrillation: Secondary | ICD-10-CM | POA: Diagnosis not present

## 2021-09-12 DIAGNOSIS — D649 Anemia, unspecified: Secondary | ICD-10-CM

## 2021-09-12 DIAGNOSIS — J90 Pleural effusion, not elsewhere classified: Secondary | ICD-10-CM | POA: Diagnosis not present

## 2021-09-12 DIAGNOSIS — C3492 Malignant neoplasm of unspecified part of left bronchus or lung: Secondary | ICD-10-CM | POA: Diagnosis not present

## 2021-09-12 DIAGNOSIS — Z95828 Presence of other vascular implants and grafts: Secondary | ICD-10-CM

## 2021-09-12 LAB — TYPE AND SCREEN
ABO/RH(D): B POS
ABO/RH(D): B POS
Antibody Screen: NEGATIVE
Antibody Screen: NEGATIVE
Unit division: 0
Unit division: 0
Unit division: 0

## 2021-09-12 LAB — BASIC METABOLIC PANEL
Anion gap: 7 (ref 5–15)
BUN: 11 mg/dL (ref 8–23)
CO2: 22 mmol/L (ref 22–32)
Calcium: 8.3 mg/dL — ABNORMAL LOW (ref 8.9–10.3)
Chloride: 105 mmol/L (ref 98–111)
Creatinine, Ser: 0.78 mg/dL (ref 0.61–1.24)
GFR, Estimated: >60 mL/min (ref >60)
Glucose, Bld: 117 mg/dL — ABNORMAL HIGH (ref 70–99)
Potassium: 4.4 mmol/L (ref 3.5–5.1)
Sodium: 134 mmol/L — ABNORMAL LOW (ref 135–145)

## 2021-09-12 LAB — CULTURE, BLOOD (ROUTINE X 2): Special Requests: ADEQUATE

## 2021-09-12 LAB — BPAM RBC
Blood Product Expiration Date: 202212202359
Blood Product Expiration Date: 202212312359
Blood Product Expiration Date: 202301012359
ISSUE DATE / TIME: 202212021823
ISSUE DATE / TIME: 202212040531
ISSUE DATE / TIME: 202212040723
Unit Type and Rh: 7300
Unit Type and Rh: 7300
Unit Type and Rh: 7300

## 2021-09-12 LAB — CBC
HCT: 33.5 % — ABNORMAL LOW (ref 39.0–52.0)
Hemoglobin: 10.6 g/dL — ABNORMAL LOW (ref 13.0–17.0)
MCH: 29.4 pg (ref 26.0–34.0)
MCHC: 31.6 g/dL (ref 30.0–36.0)
MCV: 92.8 fL (ref 80.0–100.0)
Platelets: 140 10*3/uL — ABNORMAL LOW (ref 150–400)
RBC: 3.61 MIL/uL — ABNORMAL LOW (ref 4.22–5.81)
RDW: 18.6 % — ABNORMAL HIGH (ref 11.5–15.5)
WBC: 10.9 10*3/uL — ABNORMAL HIGH (ref 4.0–10.5)
nRBC: 0 % (ref 0.0–0.2)

## 2021-09-12 MED ORDER — SODIUM CHLORIDE 0.9 % IV SOLN: INTRAVENOUS | Status: DC

## 2021-09-12 MED ORDER — ENSURE ENLIVE PO LIQD
237.0000 mL | Freq: Two times a day (BID) | ORAL | Status: DC
  Administered 2021-09-13 – 2021-09-17 (×6): 237 mL via ORAL

## 2021-09-12 MED ORDER — ADULT MULTIVITAMIN W/MINERALS CH
1.0000 | ORAL_TABLET | Freq: Every day | ORAL | Status: DC
  Administered 2021-09-13 – 2021-09-17 (×5): 1 via ORAL
  Filled 2021-09-12 (×5): qty 1

## 2021-09-12 MED ORDER — METOPROLOL TARTRATE 5 MG/5ML IV SOLN
5.0000 mg | INTRAVENOUS | Status: DC | PRN
  Administered 2021-09-13 (×2): 5 mg via INTRAVENOUS
  Filled 2021-09-12 (×3): qty 5

## 2021-09-12 MED ORDER — SODIUM CHLORIDE 0.9 % IV BOLUS
500.0000 mL | Freq: Once | INTRAVENOUS | Status: AC
  Administered 2021-09-12: 500 mL via INTRAVENOUS

## 2021-09-12 MED ORDER — METOPROLOL SUCCINATE ER 25 MG PO TB24
25.0000 mg | ORAL_TABLET | Freq: Every day | ORAL | Status: DC
  Administered 2021-09-13 – 2021-09-16 (×4): 25 mg via ORAL
  Filled 2021-09-12 (×5): qty 1

## 2021-09-12 MED ORDER — MUPIROCIN 2 % EX OINT
1.0000 "application " | TOPICAL_OINTMENT | Freq: Two times a day (BID) | CUTANEOUS | Status: DC
  Administered 2021-09-12 – 2021-09-16 (×9): 1 via NASAL
  Filled 2021-09-12 (×2): qty 22

## 2021-09-12 MED ORDER — CHLORHEXIDINE GLUCONATE CLOTH 2 % EX PADS
6.0000 | MEDICATED_PAD | Freq: Every day | CUTANEOUS | Status: AC
  Administered 2021-09-12 – 2021-09-16 (×5): 6 via TOPICAL

## 2021-09-12 NOTE — Progress Notes (Signed)
[place] for Infectious Disease  Date of Admission:  09/09/2021   Total days of inpatient antibiotics 4  Principal Problem:   Symptomatic anemia Active Problems:   Non-small cell carcinoma of left lung, stage 3 (HCC)   Pleural effusion on left   Hyponatremia   Persistent atrial fibrillation (HCC)   Pulmonary embolism (HCC)   Protein-calorie malnutrition, severe (HCC)   Pleural effusion          Assessment:  88 YM with non-small cell lung cancer of LUL Ds in 2022 SP chemotherapy admitted for weakness/anemia. Blood Cx+ MRSA. CT showed aortic perforation along with hepatic metastasis and moderate left and right pleural effusion. Taken to OR for EVAR with vascular surgery.  Recent hospitalization: Pt had recently been diagnosed with afib and PE been taken to OR for volvulus.  #MSSA bacteremia #Aortic perforation at the aortic bifurcation  and periaortic hematoma SP EVAR #Deep pelvic abscess 2.6x4.0 cm(decreasing in size) #New hepatic metastasis in a lung cancer patient  -Pt is continued on cefazolin. Denies any prior Hx of of infectious process, denies hardware. Etiology of bacteremia is unclear, could be hospital acquired as he had a recent intervention for volvulus. Per CT read pt has resolving abscess in the pelvis.  -TTE showed mild thickening of the aortic valve, consider TEE -PE with right ileofemoral DVT on anticoagulation Recommendations: -Continue cefazolin -Follow repeat blood Cx -Obtain TEE   Microbiology:   Antibiotics: Azithromycin 12/2 Ceftriaxone 12/2 Cefazolin 12/3-p  Cultures: Blood 12/2 2/2 MSSA 12/3 pending Urine 12/2 NGTD    SUBJECTIVE: Pt is resting in bed with wife at bedside. No new complaints, no significant overnight events.   Review of Systems: Review of Systems  All other systems reviewed and are negative.   Scheduled Meds:  (feeding supplement) PROSource Plus  30 mL Oral BID   sodium chloride   Intravenous Once    aspirin EC  81 mg Oral Q0600   Chlorhexidine Gluconate Cloth  6 each Topical Daily   Chlorhexidine Gluconate Cloth  6 each Topical Q0600   diltiazem  120 mg Oral Daily   docusate sodium  100 mg Oral Daily   enoxaparin (LOVENOX) injection  70 mg Subcutaneous O75I   folic acid  1 mg Oral Daily   mupirocin ointment  1 application Nasal BID   pantoprazole  40 mg Oral Daily   rosuvastatin  5 mg Oral Daily   sodium chloride  1 spray Each Nare Once   Continuous Infusions:  sodium chloride 125 mL/hr at 09/12/21 0625   sodium chloride      ceFAZolin (ANCEF) IV 2 g (09/12/21 0443)   magnesium sulfate bolus IVPB     PRN Meds:.sodium chloride, acetaminophen **OR** acetaminophen, alum & mag hydroxide-simeth, bisacodyl, cyclobenzaprine, guaiFENesin-dextromethorphan, hydrALAZINE, magnesium sulfate bolus IVPB, metoprolol tartrate, morphine injection, ondansetron **OR** ondansetron (ZOFRAN) IV, oxyCODONE-acetaminophen, phenol, polyethylene glycol Allergies  Allergen Reactions   Diclofenac Sodium Other (See Comments)    Kidneys were affected    OBJECTIVE: Vitals:   09/12/21 0902 09/12/21 0953 09/12/21 1000 09/12/21 1208  BP: (!) 105/49 101/60  94/61  Pulse: 100   80  Resp: 20 20  18   Temp: 98 F (36.7 C)   97.9 F (36.6 C)  TempSrc: Oral   Oral  SpO2: 93% 91% 92% 94%  Weight:      Height:       Body mass index is 23.82 kg/m.  Physical Exam Constitutional:  General: He is not in acute distress.    Appearance: He is normal weight. He is not toxic-appearing.  HENT:     Head: Normocephalic and atraumatic.     Right Ear: External ear normal.     Left Ear: External ear normal.     Nose: No congestion or rhinorrhea.     Mouth/Throat:     Mouth: Mucous membranes are moist.     Pharynx: Oropharynx is clear.  Eyes:     Extraocular Movements: Extraocular movements intact.     Conjunctiva/sclera: Conjunctivae normal.     Pupils: Pupils are equal, round, and reactive to light.   Cardiovascular:     Rate and Rhythm: Normal rate and regular rhythm.     Heart sounds: No murmur heard.   No friction rub. No gallop.  Pulmonary:     Effort: Pulmonary effort is normal.     Breath sounds: Normal breath sounds.  Abdominal:     General: Abdomen is flat. Bowel sounds are normal.     Palpations: Abdomen is soft.  Musculoskeletal:        General: No swelling. Normal range of motion.     Cervical back: Normal range of motion and neck supple.  Skin:    General: Skin is warm and dry.  Neurological:     General: No focal deficit present.     Mental Status: He is oriented to person, place, and time.  Psychiatric:        Mood and Affect: Mood normal.      Lab Results Lab Results  Component Value Date   WBC 10.9 (H) 09/12/2021   HGB 10.6 (L) 09/12/2021   HCT 33.5 (L) 09/12/2021   MCV 92.8 09/12/2021   PLT 140 (L) 09/12/2021    Lab Results  Component Value Date   CREATININE 0.78 09/12/2021   BUN 11 09/12/2021   NA 134 (L) 09/12/2021   K 4.4 09/12/2021   CL 105 09/12/2021   CO2 22 09/12/2021    Lab Results  Component Value Date   ALT 13 09/09/2021   AST 14 (L) 09/09/2021   ALKPHOS 83 09/09/2021   BILITOT 0.3 09/09/2021        Laurice Record, [place] [facility] 09/12/2021, 12:28 PM

## 2021-09-12 NOTE — Progress Notes (Signed)
Initial Nutrition Assessment  DOCUMENTATION CODES:   Not applicable  INTERVENTION:  - Encourage PO intake  - Ensure Enlive po BID, each supplement provides 350 kcal and 20 grams of protein  - MVI with minerals daily  - Discontinue Prosource BID  NUTRITION DIAGNOSIS:   Increased nutrient needs related to acute illness (symptomatic anemia, L pleural effusion secondary to bacteremia) as evidenced by estimated needs.  GOAL:   Patient will meet greater than or equal to 90% of their needs  MONITOR:   PO intake, Supplement acceptance, Labs, Weight trends  REASON FOR ASSESSMENT:   Consult Assessment of nutrition requirement/status  ASSESSMENT:   Pt admitted from home with generalized weakness secondary to symptomatic anemia and left pleural effusion secondary to bacteremia. PMH includes lung cancer diagnosed April 2022, recent diagnosis of afib and PE after surgical intervention for volvulus.  12/4: per vascular surgery- pt with contained leak at aortic bifurcation; pt at risk for progression and rupture; percutaneous endovascular aneurysm repair  Pt resting in bed. His wife was present at bedside and provided history on pt and reports that he was c/o back pain and just received pain medication. She states that he was on chemo and radiation for lung cancer earlier this year and lost weight but once he stopped in August and started immunotherapy, his appetite was beginning to come back until his surgery in October. Since then, he has had a decrease in appetite and eats 3 meals per day but usually only a bite to about 50% of the meals. During admission, she reports a PO intake of 50-100% of most meals. He has been drinking 2 Boost at home and would like to continue to receive Ensure during admission.   [name] states that he weighed 170 lbs in August and on Friday weighed 146 lbs however on Saturday when he was weighed in the hospital she states that he was 165 lbs which was  concerning to her that he gained 20 lbs in one day but suspects it was caused by IV fluids. Per review of chart, noted steady but consistent weight loss since April 2022. Pt has had a 7% weight loss from May 2022-December 2022.   Explained importance of nutrition focused physical exam secondary to weight loss and decreased appetite. Suspect some degree of malnutrition, however unable to confirm at this time.    Medications: colace, folvite, protonix, IV abx  Labs: sodium 134, corrected calcium 11.02, AST 14  NUTRITION - FOCUSED PHYSICAL EXAM: Deferred to f/u as pt in pain and resting  Diet Order:   Diet Order             Diet regular Room service appropriate? Yes with Assist; Fluid consistency: Thin  Diet effective now                  EDUCATION NEEDS:   Education needs have been addressed  Skin:  Skin Assessment: Skin Integrity Issues: Skin Integrity Issues:: Incisions Incisions: lower medial abdomen; L groin; R groin  Last BM:  09/10/21-type 2; type 1  Height:   Ht Readings from Last 1 Encounters:  09/11/21 5\' 10"  (1.778 m)    Weight:   Wt Readings from Last 1 Encounters:  09/11/21 75.3 kg   BMI:  Body mass index is 23.82 kg/m.  Estimated Nutritional Needs:   Kcal:  1900-2100  Protein:  95-105g  Fluid:  >/=1.9L  Clayborne Dana, RDN, LDN Clinical Nutrition

## 2021-09-12 NOTE — Progress Notes (Signed)
   VASCULAR SURGERY ASSESSMENT & PLAN:   POD 1 ENDOVASCULAR ANEURYSM REPAIR: The patient is doing well s/p EVAR for a contained leak at the aortic bifurcation.  His incisions look fine and he has palpable posterior tibial pulses.  I have arranged follow-up with a CT of the abdomen and pelvis in 1 month.  VASCULAR QUALITY INITIATIVE: He is on aspirin and is on a statin.  ANTICOAGULATION: He was on subcu Lovenox for a DVT and this can be restarted today.  I believe he was on Lovenox because of his history of cancer.  Vascular surgery will follow peripherally.  SUBJECTIVE:   No complaints this morning.  This morning he is alert and oriented.  PHYSICAL EXAM:   Vitals:   09/11/21 1700 09/11/21 2000 09/11/21 2320 09/12/21 0310  BP: (!) 121/56 114/65 (!) 107/47 108/65  Pulse:  (!) 108 (!) 105 (!) 103  Resp: 20 19 20 20   Temp:  98.9 F (37.2 C) 98.6 F (37 C) 98 F (36.7 C)  TempSrc:  Oral Oral Oral  SpO2: 93% 92% 93% 90%  Weight:      Height:       His groin incisions look fine. He has palpable posterior tibial pulses.  LABS:   Lab Results  Component Value Date   WBC 10.9 (H) 09/12/2021   HGB 10.6 (L) 09/12/2021   HCT 33.5 (L) 09/12/2021   MCV 92.8 09/12/2021   PLT 140 (L) 09/12/2021   Lab Results  Component Value Date   CREATININE 0.78 09/12/2021    PROBLEM LIST:    Principal Problem:   Symptomatic anemia Active Problems:   Non-small cell carcinoma of left lung, stage 3 (HCC)   Pleural effusion on left   Hyponatremia   Persistent atrial fibrillation (HCC)   Pulmonary embolism (HCC)   Protein-calorie malnutrition, severe (HCC)   Pleural effusion   CURRENT MEDS:    (feeding supplement) PROSource Plus  30 mL Oral BID   sodium chloride   Intravenous Once   aspirin EC  81 mg Oral Q0600   Chlorhexidine Gluconate Cloth  6 each Topical Daily   Chlorhexidine Gluconate Cloth  6 each Topical Q0600   diltiazem  120 mg Oral Daily   docusate sodium  100 mg Oral  Daily   enoxaparin (LOVENOX) injection  70 mg Subcutaneous M63O   folic acid  1 mg Oral Daily   mupirocin ointment  1 application Nasal BID   pantoprazole  40 mg Oral Daily   rosuvastatin  5 mg Oral Daily   sodium chloride  1 spray Each Nare Once    [employer] Office: [phone] 09/12/2021

## 2021-09-12 NOTE — Consult Note (Signed)
   [facility] Inpatient Consult   [birth year]  [place] 9[birth year]19 [id number]  [place] [facility] [ACO] Patient: Medicare CMS DCE  Primary Care Provider:  [name], MD   Patient is currently active with [facility] for chronic disease management services.  Patient has been engaged by a [facility].  Our community based plan of care has had difficulty maintaining contact with member.  Came by to speak with patient and currently in patient care.  Spoke with inpatient unit TO RNCM that [facility] had followed.  Patient will receive a post hospital call and will be evaluated for assessments and disease process education.    Plan:  Inpatient Transition Of Care [TOC] team member made aware that [facility] following. Of note, [facility] Care Management services does not replace or interfere with any services that are needed or arranged by inpatient [facility] care management team.  For additional questions or referrals please contact:  Natividad Brood, RN BSN [facility] Liaison  [phone] business mobile phone Toll free office [phone]  Fax number: [phone] [email or web address]

## 2021-09-12 NOTE — Progress Notes (Signed)
PROGRESS NOTE    Leon Lyons  JSE:[id number] DOB: 10/27/1932 DOA: 09/09/2021 PCP: [name], MD    Brief Narrative:  Mr. Leon Lyons was admitted to the hospital with working diagnosis of symptomatic anemia. Lower GI bleed from volvulus anastomosis. Complicated with staph aureus MSSA bacteremia and aortic leak at the bifurcation of the aorta.    87 yo male with the past medical history of lung cancer, with recent diagnosis of atrial fibrillation and pulmonary embolism, after a abdominal surgery for volvulus.  Patient reported generalized weakness and episodic dyspnea on exertion. On his initial physical examination his temp was 100 F, HR 11, RR 22 and BP 119/72 with 02 saturation 96%. He was pale, lungs clear to auscultation, heart with S1 and S2 present, irregularly irregular, abdomen soft and non tender, no lower extremity edema.    NA 129, K 3,7, Cl 98, bicarbonate 25, glucose 130, BUN 21 and serum cr at 0.85 Wbc 10,5, hgb 7,4, hct 24 and platelets 244.  Sars COVID 19 negative   UA with specific gravity 1.012   Chest film with left upper lobe interstitial infiltrate and left pleural effusion Chest CT with left perihilar post radiation changes, with fibrosis, loss of lung volume, and ground glass opacities.  Right lower lobe pulmonary embolism with pulmonary infarction.  Right 5th rib metastatic lesions.    CT abdomen wit right ileofemoral DVT. Contained aortic leak at the aortic bifurcation with periarotic hematoma.    EKG 135 bpm, left axis deviation, right bundle branch block, atrial fibrillation rhythm, no significant ST segment or T wave changes.    Patient had 1 unit PRBC transfusion and GI was consulted. Suspected bleeding from surgical site at the volvulus anastomosis and conservative care was recommended.    Blood cultures positive for staph aureus and patient was placed on cefazolin.    12/04 patient was transferred to [facility] from [facility] for vascular evaluation for aortic  leak.  Received PRBC transfusion and underwent percutaneous endovascular aneurysm repair. 09/11/21.    Assessment & Plan:   Principal Problem:   Symptomatic anemia Active Problems:   Non-small cell carcinoma of left lung, stage 3 (HCC)   Pleural effusion on left   Hyponatremia   Persistent atrial fibrillation (HCC)   Pulmonary embolism (HCC)   Protein-calorie malnutrition, severe (HCC)   Pleural effusion     Acute symptomatic anemia, due to surgical colonic anastomosis.  Sp total of 3 units PRBC transfusion since admission.  No signs of persistent bleeding. Follow hgb today is 10,6 with Hct at 33,5.  Thrombocytopenia 140.    2. Aortic leak (contained) now sp endovascular repair.  Continue post op care per vascular surgery. Patient will need follow up with CT abdomen and pelvis in 4 weeks as outpatient. Anticoagulation has been resumed with good toleration.    3. Atrial fibrillation.  His heart rate has been elevated in the setting of back pain. Plan to continue pain control with IV analgesics, continue rate control with diltiazem and as needed IV metoprolol.  Resume oral metoprolol 25 mg succinate.   Anticoagulation with enoxaparin  Continue telemetry monitoring.    4. Pulmonary embolism. Right ileofemoral DVT.  Continue anticoagulation with therapeutic enoxaparin    5. Staphylococcus bacteremia.  Echocardiogram with preserved LV systolic function 60 to 073, moderate tricuspid valve regurgitation. Calcification of the Jay of the aortic valve and mild thickening of the aortic valve.  Continue cefazolin IV and will need further work up with TEE Follow up blood  cultures with no growth. Left knee erythema and tender point, no frank edema, continue close monitoring.    6. Stage 3 non small cell lung cancer.   Patient will follow up as outpatient with Dr Julien Nordmann.    7. HTN/ hypotension   Blood pressure has been low 94/61.  Will add 500 cc bolus today and continue with  isotonic saline at 100 ml per Hr,. Continue close follow up blood pressure, keep MAP 65 or greater.    8. Hyponatremia and hypokalemia Stable renal function with serum cr at 0,78, K is 4,4 and serum bicarbonate at 22. Continue close follow up on renal function and electrolytes, avoid hypotension and nephrotoxic medications.    9. Calorie protein malnutrition. Acute on chronic back pain Continue with nutritional supplements.   Continue pain control with oxycodone, IV morphine and muscle relaxants.   Patient continue to be at high risk for worsening hypotension   Status is: Inpatient  Remains inpatient appropriate because: IV antibiotic therapy   DVT prophylaxis: Enoxaparin   Code Status:    full  Family Communication:   I spoke with patient's wife  at the bedside, we talked in detail about patient's condition, plan of care and prognosis and all questions were addressed.     Consultants:  ID GI  Vascular surgery   Procedures:  Endovascular aortic leak repair   Antimicrobials:  Cefazolin     Subjective: Patient with severe back pain, worse with movement and touch, no nausea or vomiting, no dyspnea or chest pain.   Objective: Vitals:   09/12/21 0902 09/12/21 0953 09/12/21 1000 09/12/21 1208  BP: (!) 105/49 101/60  94/61  Pulse: 100   80  Resp: 20 20  18   Temp: 98 F (36.7 C)   97.9 F (36.6 C)  TempSrc: Oral   Oral  SpO2: 93% 91% 92% 94%  Weight:      Height:        Intake/Output Summary (Last 24 hours) at 09/12/2021 1311 Last data filed at 09/11/2021 2300 Gross per 24 hour  Intake --  Output 600 ml  Net -600 ml   Filed Weights   09/09/21 2112 09/10/21 0954 09/11/21 0002  Weight: 72 kg 74.8 kg 75.3 kg    Examination:   General: deconditioned and in pain  Neurology: Awake and alert, non focal  E ENT: mild pallor, no icterus, oral mucosa moist Cardiovascular: No JVD. S1-S2 present, rhythmic, no gallops, rubs, or murmurs. No lower extremity  edema. Pulmonary: positive breath sounds bilaterally, with no wheezing, rhonchi or rales. Gastrointestinal. Abdomen soft and non tender Skin. No rashes Musculoskeletal: tender to palpation at the right paraspinal region, lumbar spine.      Data Reviewed: I have personally reviewed following labs and imaging studies  CBC: Recent Labs  Lab 09/09/21 1248 09/10/21 0833 09/11/21 0830 09/11/21 0957 09/12/21 0136  WBC 10.5 9.1  --  8.1 10.9*  NEUTROABS 9.0*  --   --   --   --   HGB 7.4* 8.0* 9.5* 9.7* 10.6*  HCT 24.0* 25.0* 28.0* 30.3* 33.5*  MCV 98.0 96.2  --  93.2 92.8  PLT 244 222  --  160 209*   Basic Metabolic Panel: Recent Labs  Lab 09/09/21 1248 09/10/21 0510 09/11/21 0830 09/11/21 0957 09/12/21 0136  NA 129* 131* 138 135 134*  K 3.7 4.1 3.3* 3.3* 4.4  CL 98 105  --  107 105  CO2 25 23  --  24 22  GLUCOSE  130* 87  --  106* 117*  BUN 21 17  --  10 11  CREATININE 0.85 0.76  --  0.66 0.78  CALCIUM 8.3* 7.8*  --  8.1* 8.3*  MG  --   --   --  1.9  --   PHOS  --  3.0  --   --   --    GFR: Estimated Creatinine Clearance: 68.4 mL/min (by C-G formula based on SCr of 0.78 mg/dL). Liver Function Tests: Recent Labs  Lab 09/09/21 1248 09/10/21 0510  AST 14*  --   ALT 13  --   ALKPHOS 83  --   BILITOT 0.3  --   PROT 6.6  --   ALBUMIN 1.8* 1.6*   No results for input(s): LIPASE, AMYLASE in the last 168 hours. No results for input(s): AMMONIA in the last 168 hours. Coagulation Profile: Recent Labs  Lab 09/09/21 1248 09/11/21 0957  INR 1.2 1.4*   Cardiac Enzymes: No results for input(s): CKTOTAL, CKMB, CKMBINDEX, TROPONINI in the last 168 hours. BNP (last 3 results) No results for input(s): PROBNP in the last 8760 hours. HbA1C: No results for input(s): HGBA1C in the last 72 hours. CBG: No results for input(s): GLUCAP in the last 168 hours. Lipid Profile: No results for input(s): CHOL, HDL, LDLCALC, TRIG, CHOLHDL, LDLDIRECT in the last 72 hours. Thyroid  Function Tests: No results for input(s): TSH, T4TOTAL, FREET4, T3FREE, THYROIDAB in the last 72 hours. Anemia Panel: Recent Labs    09/09/21 1721  VITAMINB12 667  FOLATE 5.2*  FERRITIN 531*  TIBC 128*  IRON 14*      Radiology Studies: I have reviewed all of the imaging during this hospital visit personally     Scheduled Meds:  (feeding supplement) PROSource Plus  30 mL Oral BID   sodium chloride   Intravenous Once   aspirin EC  81 mg Oral Q0600   Chlorhexidine Gluconate Cloth  6 each Topical Daily   Chlorhexidine Gluconate Cloth  6 each Topical Q0600   diltiazem  120 mg Oral Daily   docusate sodium  100 mg Oral Daily   enoxaparin (LOVENOX) injection  70 mg Subcutaneous Q12H   feeding supplement  237 mL Oral BID BM   folic acid  1 mg Oral Daily   multivitamin with minerals  1 tablet Oral Daily   mupirocin ointment  1 application Nasal BID   pantoprazole  40 mg Oral Daily   rosuvastatin  5 mg Oral Daily   sodium chloride  1 spray Each Nare Once   Continuous Infusions:  sodium chloride 125 mL/hr at 09/12/21 0625   sodium chloride      ceFAZolin (ANCEF) IV 2 g (09/12/21 0443)   magnesium sulfate bolus IVPB       LOS: 2 days        Shevelle Smither Gerome Apley, MD

## 2021-09-13 ENCOUNTER — Encounter (HOSPITAL_COMMUNITY): Payer: Self-pay | Admitting: Vascular Surgery

## 2021-09-13 DIAGNOSIS — E43 Unspecified severe protein-calorie malnutrition: Secondary | ICD-10-CM | POA: Diagnosis not present

## 2021-09-13 DIAGNOSIS — C3492 Malignant neoplasm of unspecified part of left bronchus or lung: Secondary | ICD-10-CM | POA: Diagnosis not present

## 2021-09-13 MED ORDER — SODIUM CHLORIDE 0.9 % IV SOLN: INTRAVENOUS | Status: DC

## 2021-09-13 MED ORDER — SALINE SPRAY 0.65 % NA SOLN
1.0000 | NASAL | Status: DC | PRN
  Filled 2021-09-13: qty 44

## 2021-09-13 NOTE — Progress Notes (Signed)
Patient BP 104/68, HR 140-160's afib. MD notified. Cardizem and metoprolol PO given per MD order.   Daymon Larsen, RN

## 2021-09-13 NOTE — Progress Notes (Signed)
[facility]  Date of Admission:  09/09/2021   Total days of inpatient antibiotics 5  Principal Problem:   Symptomatic anemia Active Problems:   Non-small cell carcinoma of left lung, stage 3 (HCC)   Pleural effusion on left   Hyponatremia   Persistent atrial fibrillation (HCC)   Pulmonary embolism (HCC)   Protein-calorie malnutrition, severe (HCC)   Pleural effusion          Assessment:  88 YM with non-small cell lung cancer of LUL Ds in 2022 SP chemotherapy admitted for weakness/anemia. Blood Cx+ MRSA. CT showed aortic perforation along with hepatic metastasis and moderate left and right pleural effusion. Taken to OR for EVAR with vascular surgery.  Recent hospitalization: Pt had recently been diagnosed with afib and PE been taken to OR for volvulus.  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 Denies any prior Hx of of infectious process, denies hardware. Etiology of bacteremia is unclear, could be hospital acquired as he had a recent intervention for volvulus(lap procedure in October 2022 at [facility]).  -TTE showed mild thickening of the aortic valve, consider TEE -PE with right ileofemoral DVT on anticoagulation Recommendations: -Continue cefazolin -Follow repeat blood Cx to ensure clearance -Obtain TEE  #Deep pelvic abscess 2.6x4.0 cm(decreasing in size) -Per CT read pt has resolving abscess in the pelvis.  -Per wife at bedside one abscess was drained  a few weeks ago at [facility] with negative Cx. Pt has not been on antibiotics.   Microbiology:   Antibiotics: Azithromycin 12/2 Ceftriaxone 12/2 Cefazolin 12/3-p  Cultures: Blood 12/2 2/2 MSSA 12/3 NGTD Urine 12/2 NGTD    SUBJECTIVE: Pt is resting in bed. Wife reports pt's belly is  more bloated than usual. He reports one  episode of vomiting this AM.   Interval: Afebrile overnight.   Review of Systems: Review of Systems  All other systems reviewed and are negative.   Scheduled Meds:  sodium chloride   Intravenous Once   aspirin EC  81 mg Oral Q0600   Chlorhexidine Gluconate Cloth  6 each Topical Daily   Chlorhexidine Gluconate Cloth  6 each Topical Q0600   diltiazem  120 mg Oral Daily   docusate sodium  100 mg Oral Daily   enoxaparin (LOVENOX) injection  70 mg Subcutaneous Q12H   feeding supplement  237 mL Oral BID BM   folic acid  1 mg Oral Daily   metoprolol succinate  25 mg Oral Daily   multivitamin with minerals  1 tablet Oral Daily   mupirocin ointment  1 application Nasal BID   pantoprazole  40 mg Oral Daily   rosuvastatin  5 mg Oral Daily   sodium chloride  1 spray Each Nare Once   Continuous Infusions:  sodium chloride     sodium chloride 100 mL/hr at 09/13/21 0357    ceFAZolin (ANCEF) IV 2 g (09/13/21 0632)   PRN Meds:.sodium chloride, acetaminophen **OR** acetaminophen, alum & mag hydroxide-simeth, bisacodyl, cyclobenzaprine, guaiFENesin-dextromethorphan, metoprolol tartrate, morphine injection, ondansetron **OR** ondansetron (ZOFRAN) IV, oxyCODONE-acetaminophen, phenol, polyethylene glycol Allergies  Allergen Reactions   Diclofenac Sodium Other (See Comments)    Kidneys were affected    OBJECTIVE: Vitals:   09/13/21 0400 09/13/21 0417 09/13/21 0746 09/13/21 0900  BP:  108/65 104/68 122/72  Pulse:  (!) 104 (!) 136 (!) 143  Resp: 17 19 20 20   Temp:  97.6 F (36.4 C) 97.6 F (36.4 C)   TempSrc:  Oral Oral   SpO2: 98% 97% 95% 93%  Weight:      Height:       Body mass index is 23.82 kg/m.  Physical Exam Constitutional:      General: He is not in acute distress.    Appearance: He is normal weight. He is not toxic-appearing.  HENT:     Head: Normocephalic and atraumatic.     Right Ear: External ear normal.     Left Ear: External ear normal.     Nose: No congestion or  rhinorrhea.     Mouth/Throat:     Mouth: Mucous membranes are moist.     Pharynx: Oropharynx is clear.  Eyes:     Extraocular Movements: Extraocular movements intact.     Conjunctiva/sclera: Conjunctivae normal.     Pupils: Pupils are equal, round, and reactive to light.  Cardiovascular:     Rate and Rhythm: Normal rate and regular rhythm.     Heart sounds: No murmur heard.   No friction rub. No gallop.  Pulmonary:     Effort: Pulmonary effort is normal.     Breath sounds: Normal breath sounds.  Abdominal:     General: Abdomen is flat. Bowel sounds are normal.     Palpations: Abdomen is soft.  Musculoskeletal:        General: No swelling. Normal range of motion.     Cervical back: Normal range of motion and neck supple.  Skin:    General: Skin is warm and dry.  Neurological:     General: No focal deficit present.     Mental Status: He is oriented to person, place, and time.  Psychiatric:        Mood and Affect: Mood normal.      Lab Results Lab Results  Component Value Date   WBC 10.9 (H) 09/12/2021   HGB 10.6 (L) 09/12/2021   HCT 33.5 (L) 09/12/2021   MCV 92.8 09/12/2021   PLT 140 (L) 09/12/2021    Lab Results  Component Value Date   CREATININE 0.78 09/12/2021   BUN 11 09/12/2021   NA 134 (L) 09/12/2021   K 4.4 09/12/2021   CL 105 09/12/2021   CO2 22 09/12/2021    Lab Results  Component Value Date   ALT 13 09/09/2021   AST 14 (L) 09/09/2021   ALKPHOS 83 09/09/2021   BILITOT 0.3 09/09/2021        Laurice Record, [facility] 09/13/2021, 10:36 AM

## 2021-09-13 NOTE — Progress Notes (Signed)
PROGRESS NOTE    Leon Lyons  SWF:[id number] DOB: 06/01/1933 DOA: 09/09/2021 PCP: [name], MD    Brief Narrative:  Mr. Reidinger was admitted to the hospital with working diagnosis of symptomatic anemia. Lower GI bleed from volvulus anastomosis. Complicated with staph aureus MSSA bacteremia and aortic leak at the bifurcation of the aorta.    87 yo male with the past medical history of lung cancer, with recent diagnosis of atrial fibrillation and pulmonary embolism, after a abdominal surgery for volvulus (at [facility]).  Patient reported generalized weakness and episodic dyspnea on exertion. On his initial physical examination his temp was 100 F, HR 11, RR 22 and BP 119/72 with 02 saturation 96%. He was pale, lungs clear to auscultation, heart with S1 and S2 present, irregularly irregular, abdomen soft and non tender, no lower extremity edema.    NA 129, K 3,7, Cl 98, bicarbonate 25, glucose 130, BUN 21 and serum cr at 0.85 Wbc 10,5, hgb 7,4, hct 24 and platelets 244.  Sars COVID 19 negative   UA with specific gravity 1.012   Chest film with left upper lobe interstitial infiltrate and left pleural effusion Chest CT with left perihilar post radiation changes, with fibrosis, loss of lung volume, and ground glass opacities.  Right lower lobe pulmonary embolism with pulmonary infarction.  Right 5th rib metastatic lesions.    CT abdomen wit right ileofemoral DVT. Contained aortic leak at the aortic bifurcation with periarotic hematoma.    EKG 135 bpm, left axis deviation, right bundle branch block, atrial fibrillation rhythm, no significant ST segment or T wave changes.    Patient had 1 unit PRBC transfusion and GI was consulted. Suspected bleeding from surgical site at the volvulus anastomosis and conservative care was recommended.    Blood cultures positive for staph aureus and patient was placed on cefazolin.    12/04 patient was transferred to [facility] from [facility] for  vascular evaluation for aortic leak.  Received PRBC transfusion and underwent percutaneous endovascular aneurysm repair. 09/11/21.   Currently pending TEE, to evaluate possible endocarditis.   Assessment & Plan:   Principal Problem:   Symptomatic anemia Active Problems:   Non-small cell carcinoma of left lung, stage 3 (HCC)   Pleural effusion on left   Hyponatremia   Persistent atrial fibrillation (HCC)   Pulmonary embolism (HCC)   Protein-calorie malnutrition, severe (HCC)   Pleural effusion   Acute symptomatic anemia, due to surgical colonic anastomosis/ thrombocytopenia  Sp total of 3 units PRBC transfusion since admission.  Patient with no signs of persistent bleeding.  Plan to follow up on cell count in am.   2. Aortic leak (contained) now sp endovascular repair.  Patient will need follow up with CT abdomen and pelvis in 4 weeks as outpatient.  Continue anticoagulation with enoxaparin with good toleration. Consult PT and Ot for evaluation, patient is very weak and deconditioned.     3. Atrial fibrillation with RVR/ hypotension Positive RVR this am, with HR up to 150. Improved rate control with AV blockers, continue with diltiazem and metoprolol As needed IV metoprolol. Continue anticoagulation with enoxaparin for now.   Telemetry and blood pressure monitoring  4. Pulmonary embolism. Right ileofemoral DVT.  Anticoagulation with therapeutic enoxaparin with good toleration.    5. Staphylococcus bacteremia.  Echocardiogram with preserved LV systolic function 60 to 220, moderate tricuspid valve regurgitation. Calcification of the Carey of the aortic valve and mild thickening of the aortic valve.   Follow up cultures with  no growth Cardiology has been contacted for TEE Continue with cefazolin for antibiotic therapy, no clinical evidence of embolization or metastatic infection.    6. Stage 3 non small cell lung cancer.   Patient will follow up as outpatient with Dr  Julien Nordmann.    7. HTN/ hypotension   Patient with peripheral edema and signs of volume overload.  Plan to hold on IV fluids and use bolus if recurrent hypotension Continue blood pressure monitoring, out of bed to char tid with meals as tolerated.    8. Hyponatremia and hypokalemia Follow up renal function in am. Avoid hypotension or nephrotoxic medications  9. Calorie protein malnutrition. Acute on chronic back pain Nutritional supplements.    Improved back pain, will continue with as needed oxycodone, IV morphine and muscle relaxants  10. Dyslipidemia. Continue with rosuvastatin.   Patient continue to be at high risk for worsening bacteremia.   Status is: Inpatient  Remains inpatient appropriate because: IV antibiotic therapy, pending TEE   DVT prophylaxis: Enoxaparin   Code Status:    full  Family Communication:  I spoke with patient's wife at the bedside, we talked in detail about patient's condition, plan of care and prognosis and all questions were addressed.      Nutrition Status: Nutrition Problem: Increased nutrient needs Etiology: acute illness (symptomatic anemia, L pleural effusion secondary to bacteremia) Signs/Symptoms: estimated needs Interventions: Ensure Enlive (each supplement provides 350kcal and 20 grams of protein), MVI     Consultants:   ID Vascular surgery  Cardiology for TEE   Procedures:  As above   Antimicrobials:   Cefazolin     Subjective: Patient with improved back pain, this am has nausea and vomiting, no chest pain or dyspnea, no palpitations.  No bowel movement but positive gas.   Objective: Vitals:   09/13/21 0400 09/13/21 0417 09/13/21 0746 09/13/21 0900  BP:  108/65 104/68 122/72  Pulse:  (!) 104 (!) 136 (!) 143  Resp: 17 19 20 20   Temp:  97.6 F (36.4 C) 97.6 F (36.4 C)   TempSrc:  Oral Oral   SpO2: 98% 97% 95% 93%  Weight:      Height:        Intake/Output Summary (Last 24 hours) at 09/13/2021 1250 Last data filed  at 09/13/2021 1151 Gross per 24 hour  Intake 1201 ml  Output 1412 ml  Net -211 ml   Filed Weights   09/09/21 2112 09/10/21 0954 09/11/21 0002  Weight: 72 kg 74.8 kg 75.3 kg    Examination:   General: deconditioned and ill looking appearing  Neurology: Awake and alert, non focal  E ENT: mild pallor, no icterus, oral mucosa moist Cardiovascular: No JVD. S1-S2 present, rhythmic, no gallops, rubs, or murmurs. +/++ pitting bilateral lower extremity edema. Pulmonary: positive breath sounds bilaterally, with no wheezing, rhonchi or rales. Gastrointestinal. Abdomen mild distended and tender to palpation, surgical wound in place. No rebound or guarding.  Skin. No rashes Musculoskeletal: no joint deformities     Data Reviewed: I have personally reviewed following labs and imaging studies  CBC: Recent Labs  Lab 09/09/21 1248 09/10/21 0833 09/11/21 0830 09/11/21 0957 09/12/21 0136  WBC 10.5 9.1  --  8.1 10.9*  NEUTROABS 9.0*  --   --   --   --   HGB 7.4* 8.0* 9.5* 9.7* 10.6*  HCT 24.0* 25.0* 28.0* 30.3* 33.5*  MCV 98.0 96.2  --  93.2 92.8  PLT 244 222  --  160 140*  Basic Metabolic Panel: Recent Labs  Lab 09/09/21 1248 09/10/21 0510 09/11/21 0830 09/11/21 0957 09/12/21 0136  NA 129* 131* 138 135 134*  K 3.7 4.1 3.3* 3.3* 4.4  CL 98 105  --  107 105  CO2 25 23  --  24 22  GLUCOSE 130* 87  --  106* 117*  BUN 21 17  --  10 11  CREATININE 0.85 0.76  --  0.66 0.78  CALCIUM 8.3* 7.8*  --  8.1* 8.3*  MG  --   --   --  1.9  --   PHOS  --  3.0  --   --   --    GFR: Estimated Creatinine Clearance: 68.4 mL/min (by C-G formula based on SCr of 0.78 mg/dL). Liver Function Tests: Recent Labs  Lab 09/09/21 1248 09/10/21 0510  AST 14*  --   ALT 13  --   ALKPHOS 83  --   BILITOT 0.3  --   PROT 6.6  --   ALBUMIN 1.8* 1.6*   No results for input(s): LIPASE, AMYLASE in the last 168 hours. No results for input(s): AMMONIA in the last 168 hours. Coagulation Profile: Recent  Labs  Lab 09/09/21 1248 09/11/21 0957  INR 1.2 1.4*   Cardiac Enzymes: No results for input(s): CKTOTAL, CKMB, CKMBINDEX, TROPONINI in the last 168 hours. BNP (last 3 results) No results for input(s): PROBNP in the last 8760 hours. HbA1C: No results for input(s): HGBA1C in the last 72 hours. CBG: No results for input(s): GLUCAP in the last 168 hours. Lipid Profile: No results for input(s): CHOL, HDL, LDLCALC, TRIG, CHOLHDL, LDLDIRECT in the last 72 hours. Thyroid Function Tests: No results for input(s): TSH, T4TOTAL, FREET4, T3FREE, THYROIDAB in the last 72 hours. Anemia Panel: No results for input(s): VITAMINB12, FOLATE, FERRITIN, TIBC, IRON, RETICCTPCT in the last 72 hours.    Radiology Studies: I have reviewed all of the imaging during this hospital visit personally     Scheduled Meds:  sodium chloride   Intravenous Once   aspirin EC  81 mg Oral Q0600   Chlorhexidine Gluconate Cloth  6 each Topical Daily   Chlorhexidine Gluconate Cloth  6 each Topical Q0600   diltiazem  120 mg Oral Daily   docusate sodium  100 mg Oral Daily   enoxaparin (LOVENOX) injection  70 mg Subcutaneous Q12H   feeding supplement  237 mL Oral BID BM   folic acid  1 mg Oral Daily   metoprolol succinate  25 mg Oral Daily   multivitamin with minerals  1 tablet Oral Daily   mupirocin ointment  1 application Nasal BID   pantoprazole  40 mg Oral Daily   rosuvastatin  5 mg Oral Daily   sodium chloride  1 spray Each Nare Once   Continuous Infusions:  sodium chloride     sodium chloride 100 mL/hr at 09/13/21 0357    ceFAZolin (ANCEF) IV 2 g (09/13/21 7169)     LOS: 3 days        Ardenia Stiner Gerome Apley, MD

## 2021-09-13 NOTE — Progress Notes (Signed)
    [facility] has been requested to perform a transesophageal echocardiogram on Mr. Glazebrook for Bacteremia.  After careful review of history and examination, the risks and benefits of transesophageal echocardiogram have been explained including risks of esophageal damage, perforation (1:10,000 risk), bleeding, pharyngeal hematoma as well as other potential complications associated with conscious sedation including aspiration, arrhythmia, respiratory failure and death. Alternatives to treatment were discussed, questions were answered. Patient is willing to proceed.  TEE - Dr. Sallyanne Kuster @ 1400. NPO after midnight. Meds with sips.   Leanor Kail, PA-C 09/13/2021 2:47 PM

## 2021-09-13 NOTE — Progress Notes (Signed)
HEMATOLOGY-ONCOLOGY PROGRESS NOTE  SUBJECTIVE: Mr. Leon Lyons is followed by our office for stage IIIb non-small cell lung cancer, morphology and immunophenotype consistent with poorly differentiated squamous cell carcinoma.  His last visit with [place] was on 07/07/2021.  He was receiving consolidation immunotherapy with Imfinzi 1500 mg every 4 weeks.  He has completed 3 cycles of treatment.  He was hospitalized in [place] and was unable to make his appointment in October and again in November.  He had some scans performed in [place] while he was hospitalized and a CT chest performed 08/15/2021 showed pulmonary emboli and also showed new right lung nodules worrisome for metastatic disease and osseous metastatic disease involving the right fifth rib and left scapula.  Our office was attempting to get him back in for follow-up sooner than his appointment scheduled for 12/21.  However, he is currently admitted to [facility] with symptomatic anemia secondary to lower GI bleed.  He also has staff aureus MSSA bacteremia and aortic leak at the bifurcation of the aorta.  I met with the patient and his wife in his hospital room.  The patient remains quite weak.  He has a poor appetite and continues to lose weight.  He reports back pain today.  He is not having any significant chest pain or shortness of breath.  He reports lower extremity swelling.  He tells me that he plans to go to rehab following hospital discharge to gain back some of his strength.  Oncology History  Non-small cell carcinoma of left lung, stage 3 ([place])  01/12/2021 Initial Diagnosis   Non-small cell carcinoma of left lung, stage 3 ([place])   01/12/2021 Cancer Staging   Staging form: Lung, AJCC 8th Edition - Clinical: Stage IIIB (cT3, cN2, cM0) - Signed by [name], MD on 01/12/2021    02/02/2021 - 03/22/2021 Chemotherapy          05/11/2021 -  Chemotherapy   Patient is on Treatment Plan : LUNG NSCLC Durvalumab q28d        REVIEW  OF SYSTEMS:   Constitutional: Denies fevers, chills  Eyes: Denies blurriness of vision Ears, nose, mouth, throat, and face: Denies mucositis or sore throat Respiratory: Denies cough, dyspnea or wheezes Cardiovascular: Denies palpitation, chest discomfort Gastrointestinal:  Denies nausea, heartburn or change in bowel habits Skin: Denies abnormal skin rashes Lymphatics: Denies new lymphadenopathy or easy bruising Neurological:Denies numbness, tingling or new weaknesses Behavioral/Psych: Mood is stable, no new changes  All other systems were reviewed with the patient and are negative.  I have reviewed the past medical history, past surgical history, social history and family history with the patient and they are unchanged from previous note.   PHYSICAL EXAMINATION: ECOG PERFORMANCE STATUS: 3 - Symptomatic, >50% confined to bed  Vitals:   09/13/21 0900 09/13/21 1300  BP: 122/72 114/60  Pulse: (!) 143 95  Resp: 20 17  Temp:  98 F (36.7 C)  SpO2: 93% 92%   Filed Weights   09/09/21 2112 09/10/21 0954 09/11/21 0002  Weight: 72 kg 74.8 kg 75.3 kg    Intake/Output from previous day: 12/05 0701 - 12/06 0700 In: 1201 [P.O.:50; I.V.:851; IV Piggyback:300] Out: 712 [Urine:712]  GENERAL:alert, no distress and comfortable SKIN: skin color, texture, turgor are normal, no rashes or significant lesions EYES: normal, Conjunctiva are pink and non-injected, sclera clear OROPHARYNX:no exudate, no erythema and lips, buccal mucosa, and tongue normal  LUNGS: clear to auscultation and percussion with normal breathing effort HEART: regular rate & rhythm and  no murmurs, pitting bilateral lower extremity edema ABDOMEN:abdomen soft, non-tender and normal bowel sounds  NEURO: alert & oriented x 3 with fluent speech, no focal motor/sensory deficits  LABORATORY DATA:  I have reviewed the data as listed CMP Latest Ref Rng & Units 09/12/2021 09/11/2021 09/11/2021  Glucose 70 - 99 mg/dL 117(H) 106(H) -   BUN 8 - 23 mg/dL 11 10 -  Creatinine 0.61 - 1.24 mg/dL 0.78 0.66 -  Sodium 135 - 145 mmol/L 134(L) 135 138  Potassium 3.5 - 5.1 mmol/L 4.4 3.3(L) 3.3(L)  Chloride 98 - 111 mmol/L 105 107 -  CO2 22 - 32 mmol/L 22 24 -  Calcium 8.9 - 10.3 mg/dL 8.3(L) 8.1(L) -  Total Protein 6.5 - 8.1 g/dL - - -  Total Bilirubin 0.3 - 1.2 mg/dL - - -  Alkaline Phos 38 - 126 U/L - - -  AST 15 - 41 U/L - - -  ALT 0 - 44 U/L - - -    Lab Results  Component Value Date   WBC 10.9 (H) 09/12/2021   HGB 10.6 (L) 09/12/2021   HCT 33.5 (L) 09/12/2021   MCV 92.8 09/12/2021   PLT 140 (L) 09/12/2021   NEUTROABS 9.0 (H) 09/09/2021    DG Knee 1-2 Views Left  Result Date: 09/10/2021 CLINICAL DATA:  LEFT knee pain EXAM: LEFT KNEE - 1-2 VIEW COMPARISON:  None. FINDINGS: Osseous alignment is normal. Bone mineralization is normal. No fracture line or displaced fracture fragment. No acute-appearing cortical irregularity or osseous lesion. No appreciable degenerative change. No appreciable joint effusion. Vascular calcifications are seen within the popliteal fossa. IMPRESSION: 1. No acute findings. No osseous fracture or dislocation. No appreciable degenerative change. 2. Vascular calcifications. Electronically Signed   By: Franki Cabot M.D.   On: 09/10/2021 13:01   DG Abd 1 View  Result Date: 09/10/2021 CLINICAL DATA:  A male at age 12 presents with concern for distended abdomen and abdominal pain. EXAM: ABDOMEN - 1 VIEW COMPARISON:  CT of the abdomen and pelvis from August 24, 2021. FINDINGS: Dense LEFT lower lobe consolidation is similar to previous imaging and associated likely with pleural effusion. RIGHT lung bases clear. EKG leads project over the abdomen. Bowel distension is improved compared to the study of August 20, 2021. There is abundant stool in the rectum on the current exam. Still with mild gaseous distension of small bowel but without substantial dilation of the colon. Signs of cholelithiasis in the  RIGHT upper quadrant. On limited assessment there is no acute skeletal process. IMPRESSION: Bowel distension is improved compared to the study of August 20, 2021. Abundant stool in the rectum on the current exam, query fecal impaction. LEFT lower lobe consolidation and likely with pleural effusion similar to previous imaging. Electronically Signed   By: Zetta Bills M.D.   On: 09/10/2021 13:05   CT CHEST W CONTRAST  Addendum Date: 09/10/2021   ADDENDUM REPORT: 09/10/2021 15:24 ADDENDUM: Addendum is made to note the presence of a small air loculation in the upper abdomen about the liver. Although the patient had a percutaneous drainage procedure on 11/17, it would be unusual for air to persist for this length of time and abdominal viscus organ perforation is a concern. Recommend CT abdomen pelvis to further evaluate. These results were called by telephone at the time of interpretation on 09/10/2021 at 3:18 pm to Dr. Landis Gandy, who verbally acknowledged these results. Electronically Signed   By: Delanna Ahmadi M.D.   On:  09/10/2021 15:24   Result Date: 09/10/2021 CLINICAL DATA:  Possible sepsis, lung cancer with radiation EXAM: CT CHEST WITH CONTRAST TECHNIQUE: Multidetector CT imaging of the chest was performed during intravenous contrast administration. CONTRAST:  23mL OMNIPAQUE IOHEXOL 350 MG/ML SOLN COMPARISON:  CT chest angiogram, 08/15/2021 FINDINGS: Cardiovascular: Aortic atherosclerosis. Redemonstrated right lower lobe segmental subsegmental pulmonary embolism (series 2, image 94). 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 Normal heart size. Extensive left coronary artery calcifications. No pericardial effusion. Mediastinum/Nodes: No enlarged mediastinal, hilar, or axillary lymph nodes. Thyroid gland, trachea, and esophagus demonstrate no significant findings. Lungs/Pleura: Moderate left, small right pleural effusions, slightly increased in volume  compared to prior examination. Total atelectasis or consolidation of the left lower lobe, increased compared to prior examination. Otherwise unchanged post treatment appearance of the perihilar left lung with extensive fibrosis and volume loss as well as some interlobular septal thickening and associated ground-glass (series 5, image 57), as well as fibrosis and ground-glass of the paramedian right upper lobe (series 5, image 53). Interval decrease in size of heterogeneous airspace disease and consolidation of the central right lower lobe, measuring 2.0 x 1.8 cm, previously more subsolid and appearance measuring 3.4 x 3.2 cm (series 5, image 105). Upper Abdomen: No acute abnormality. Musculoskeletal: No chest wall mass. Unchanged lytic osseous lesion of the lateral right fifth rib (series 5, image 79) and of the left scapular body (series 5, image 49). IMPRESSION: 1. Moderate left, small right pleural effusions, slightly increased in volume compared to prior examination. 2. Total atelectasis or consolidation of the left lower lobe, increased compared to prior examination. 3. Otherwise unchanged post treatment appearance of the perihilar left lung with extensive fibrosis and volume loss as well as some interlobular septal thickening and associated ground-glass. There is additional fibrosis and ground-glass of the paramedian right upper lobe. This constellation of findings is most consistent with developing radiation fibrosis with a component of persisting radiation pneumonitis. Superimposed infection would however be difficult to strictly exclude. 4. Expected interval evolution of a pulmonary infarction in the right lower lobe. There is residual right lower lobe embolus noted on this non tailored, non angiographic examination. 5. Unchanged osseous metastatic lesions of the lateral right fifth rib and of the left scapular body. 6. Coronary artery disease. Aortic Atherosclerosis (ICD10-I70.0). Electronically Signed:  By: Delanna Ahmadi M.D. On: 09/10/2021 14:26   CT ABDOMEN PELVIS W CONTRAST  Addendum Date: 09/10/2021   ADDENDUM REPORT: 09/10/2021 22:04 ADDENDUM: Interval development of multiple suspected small hepatic metastases as described above. These results were called by telephone at the time of interpretation on 09/10/2021 at 10:01 pm to provider Lovey Newcomer, who verbally acknowledged these results. Electronically Signed   By: Fidela Salisbury M.D.   On: 09/10/2021 22:04   Result Date: 09/10/2021 CLINICAL DATA:  Abdominal distension EXAM: CT ABDOMEN AND PELVIS WITH CONTRAST TECHNIQUE: Multidetector CT imaging of the abdomen and pelvis was performed using the standard protocol following bolus administration of intravenous contrast. CONTRAST:  68mL OMNIPAQUE IOHEXOL 350 MG/ML SOLN COMPARISON:  08/24/2021 FINDINGS: Lower chest: Focal consolidation within the central right lower lobe is again noted and demonstrates slight interval decrease in size better seen on accompanying CT examination of the chest performed earlier. Bilateral pleural effusions are present, left greater than right with complete collapse of the left lower lobe. Small pericardial effusion. Cardiac size within normal limits. Hepatobiliary: Scattered low-attenuation lesions within the liver are noted. While several of these lesions are compatible with  simple cysts, at least 1 is enlarged since prior examination (right hepatic dome, axial image # 14/2) an 2 are new d (16 mm axial image # 18/2 and 11 mm, axial image # 20/2 within the pericaval region) suspicious for progressive metastatic disease in this patient with known metastatic lung cancer. Cholelithiasis without pericholecystic inflammatory change noted. No intra or extrahepatic biliary ductal dilation. Pancreas: Unremarkable Spleen: Unremarkable Adrenals/Urinary Tract: Adrenal glands are unremarkable. Kidneys are normal in position. Right kidney is normal in size. Left kidney demonstrates interval  resolution of previously noted severe hydronephrosis. Superimposed moderate to severe left renal cortical atrophy persists, asymmetrically more severe within the lower pole. Bladder is unremarkable save for mass effect at the bladder base by the hypertrophied central prostate gland. Stomach/Bowel: Small focus of gas seen within the right subdiaphragmatic region is again noted and appears decreased in size since prior examination. Multiple additional foci of gas seen on prior examination appeared over resolved and this may simply represent the residua of the previous intra-abdominal process. Mild ascites persists. Stomach, small bowel, and large bowel are unremarkable save for mild sigmoid diverticulosis. Appendix normal. Rim calcified cystic collection within the mid abdomen appears slightly decreased in size measuring 5.1 x 6.8 cm. l rim enhancing loculated fluid collection compatible with a residual abscess is again seen with the in the deep pelvis demonstrating slight interval decrease in size measuring 2.6 x 4.0 cm at axial image # 72/2. No new intra-abdominal loculated fluid collections. Vascular/Lymphatic: There is a contained aortic leak at the aortic bifurcation with periaortic hematoma identified. The contained leak is best appreciated on axial image # 55/2. This is new since remote prior examination of 07/30/2021. Periaortic hematoma appears relatively stable since prior examination. Superimposed extensive aortoiliac atherosclerotic calcification. No pathologic adenopathy within the abdomen and pelvis. There is intraluminal filling defect identified within the right common femoral vein in keeping with acute DVT extending into the right common iliac vein, stable since prior examination Reproductive: Prostate gland is moderately enlarged. Seminal vesicles are unremarkable. Other: Lobulated soft tissue in the periumbilical region appears stable since prior examination this is of unclear significance.  Increasing diffuse body wall subcutaneous edema noted. Musculoskeletal: Lytic lesion within the right sacral ala again noted in keeping with osseous metastatic disease. No acute bone abnormality. IMPRESSION: Contained aortic perforation at the aortic bifurcation with stable periaortic hematoma. Interval resolution of right pericolic gutter abscess. Interval decrease in size of a deep pelvic abscess now measuring 2.6 x 4.0 cm. Decreasing right subdiaphragmatic loculated gas collection, possibly the residua of a previously noted intra-abdominal process. Mild superimposed ascites appears stable. Bilateral pleural effusions are stable, better assessed on prior CT examination. Cholelithiasis. Interval resolution of left hydronephrosis. Superimposed marked left renal atrophy again noted. Slight interval decrease in rim calcified cystic collection within the small bowel mesentery, of unclear significance. This may represent the sequela of prior treated disease, intervention, or a a chronic abscess or hematoma. Stable lobulated soft tissue within the anterior abdominal wall within the periumbilical region a, of unclear significance. This may be better assessed with dedicated sonography. Stable right iliofemoral DVT. Attempts are being made at this time to contact the managing clinician for direct verbal communication of these findings. Electronically Signed: By: Fidela Salisbury M.D. On: 09/10/2021 21:57   DG Chest Port 1 View  Result Date: 09/11/2021 CLINICAL DATA:  Status post abdominal aortic aneurysm repair, dyspnea EXAM: PORTABLE CHEST 1 VIEW COMPARISON:  09/09/2021 FINDINGS: Large left pleural effusion is present, enlarged  since prior examination, with core aggressive volume loss involving the residual aerated left upper lobe. Complete collapse of the left lower lobe again noted. Superimposed focal opacity within the left upper lobe has enlarged in the interval possibly representing a a superimposed pneumonic  infiltrate. Right lung is clear. No pneumothorax or pleural effusion on the right. Cardiac size is mildly enlarged, unchanged. No acute bone abnormality. IMPRESSION: Progressive consolidation within the residual aerated left upper lobe. Superimposed pneumonic infiltrate is not excluded. Enlarging left pleural effusion with collapse of the left lower lobe and progressive compressive atelectasis of the left upper lobe. Stable cardiomegaly Electronically Signed   By: Fidela Salisbury M.D.   On: 09/11/2021 20:30   DG Chest Port 1 View  Result Date: 09/09/2021 CLINICAL DATA:  Concern for sepsis EXAM: PORTABLE CHEST 1 VIEW COMPARISON:  08/19/2019 FINDINGS: normal cardiac silhouette. Moderate LEFT effusion is new from prior. LEFT lung base not well evaluated. RIGHT lung clear. IMPRESSION: New moderate size LEFT effusion. Cannot exclude LEFT lower lobe atelectasis or infiltrate. Electronically Signed   By: Suzy Bouchard M.D.   On: 09/09/2021 13:43   ECHOCARDIOGRAM LIMITED  Result Date: 09/10/2021    ECHOCARDIOGRAM LIMITED REPORT   Patient Name:   Leon Lyons Date of Exam: 09/10/2021 Medical Rec #:  [id number]         Height:       70.0 in Accession #:    [id number]        Weight:       164.9 lb Date of Birth:  1933/06/27         BSA:          1.923 m Patient Age:    38 years          BP:           107/55 mmHg Patient Gender: M                 HR:           71 bpm. Exam Location:  Inpatient Procedure: Limited Echo, Color Doppler and Cardiac Doppler Indications:    Bacteremia  History:        Patient has prior history of Echocardiogram examinations, most                 recent 08/17/2021. Arrythmias:Atrial Fibrillation; Risk                 Factors:Current Smoker.  Sonographer:    Maudry Mayhew MHA, RDMS, RVT, RDCS Referring Phys: [place]  1. Left ventricular ejection fraction, by estimation, is 60 to 65%. The left ventricle has normal function. The left ventricle has no regional wall  motion abnormalities.  2. The mitral valve is normal in structure. Trivial mitral valve regurgitation.  3. Tricuspid valve regurgitation is moderate.  4. There is a calcification on the [place] of the aortic valve. . There is mild thickening of the aortic valve. Aortic valve regurgitation is trivial. FINDINGS  Left Ventricle: Left ventricular ejection fraction, by estimation, is 60 to 65%. The left ventricle has normal function. The left ventricle has no regional wall motion abnormalities. The left ventricular internal cavity size was normal in size. Pericardium: There is no evidence of pericardial effusion. Mitral Valve: The mitral valve is normal in structure. Trivial mitral valve regurgitation. There is no evidence of mitral valve vegetation. Tricuspid Valve: The tricuspid valve is normal in structure. Tricuspid valve regurgitation is moderate. There is no  evidence of tricuspid valve vegetation. Aortic Valve: There is a calcification on the [place] of the aortic valve. There is mild thickening of the aortic valve. Aortic valve regurgitation is trivial. Aortic regurgitation PHT measures 720 msec. LEFT VENTRICLE PLAX 2D LVOT diam:     1.90 cm LVOT Area:     2.84 cm  AORTIC VALVE AI PHT:      720 msec MR Peak grad: 54.8 mmHg   TRICUSPID VALVE MR Vmax:      370.00 cm/s TR Peak grad:   36.7 mmHg                           TR Vmax:        303.00 cm/s                            SHUNTS                           Systemic Diam: 1.90 cm Mertie Moores MD Electronically signed by Mertie Moores MD Signature Date/Time: 09/10/2021/3:28:54 PM    Final    HYBRID OR IMAGING ([place])  Result Date: 09/11/2021 There is no interpretation for this exam.  This order is for images obtained during a surgical procedure.  Please See "Surgeries" Tab for more information regarding the procedure.    ASSESSMENT AND PLAN: This is a very pleasant 87 year old male diagnosed with stage IIIB non-small cell lung cancer, most consistent with squamous  cell carcinoma.  He presented with a centrally obstructing left upper lobe lung mass with contralateral mediastinal lymphadenopathy.  There is also an indeterminate midline soft tissue nodule posterior to the L2 vertebrae which is indeterminate.  He was diagnosed in April 2022.  His PD-L1 expression was 0%.   The patient completed course of concurrent chemoradiation with weekly carboplatin for an AUC of 2 and paclitaxel 45 mg per metered squared.  He is status post 6 cycles of treatment.  He tolerated this well without any concerning adverse side effects except for dysphagia.  He then received consolidation immunotherapy with Imfinzi 1500 mg IV every 4 weeks.  He completed 3 cycles of this treatment which was last given on 07/07/2021.  Thereafter, he missed his doses for October and November due to a prolonged hospitalization in [place], [place].  The patient had scans at the outside hospital and I was able to see the reports which indicate new pulmonary lesions concerning for metastatic disease as well as bone mets.  I discussed the scan results from the outside hospital with the patient and his wife.  We discussed that we will get him set up with Dr. Julien Nordmann for outpatient follow-up to discuss treatment options.  However, he needs to focus now on his acute medical issues including staph bacteremia.  We also further discussed that he needs improvement in his performance status and nutrition before we consider therapy.  Dietitian is currently following.  Recommend PT/OT evaluation when medically stable and he may benefit from skilled facility for short-term rehab.  I will continue to follow his chart and arrange for outpatient follow-up with Dr. Julien Nordmann once a clear discharge plan has been identified.  Future Appointments  Date Time Provider [place]  09/23/2021 11:30 AM Valente David, RN [facility] None  09/28/2021 10:00 AM [facility] LAB [facility] None  09/28/2021 10:30 AM [name], MD [facility] None  09/28/2021 11:30 AM [facility] INFUSION [facility] None  10/13/2021  1:40 PM Angelia Mould, MD [facility]      LOS: 3 days   Mikey Bussing, DNP, AGPCNP-BC, AOCNP 09/13/21

## 2021-09-13 NOTE — Evaluation (Signed)
Physical Therapy Evaluation Patient Details Name: Leon Lyons MRN: [id number] DOB: 1932/10/22 Today's Date: 09/13/2021  History of Present Illness  87 yo male with the past medical history of lung cancer, with recent diagnosis of atrial fibrillation and pulmonary embolism, after a abdominal surgery for volvulus admitted due to GI bleed from volvulus anastomosis and symptomatic anemia.  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  Clinical Impression  Patient presents with decreased mobility due to generalized weakness, decreased balance, decreased activity tolerance, and was limited to EOB mobility this session with SpO2 81% with scooting along EOB and HR max 132.  Patient will benefit from skilled PT in the acute setting and follow up acute inpatient rehab prior to d/c home with wife assist.        Recommendations for follow up therapy are one component of a multi-disciplinary discharge planning process, led by the attending physician.  Recommendations may be updated based on patient status, additional functional criteria and insurance authorization.  Follow Up Recommendations Acute inpatient rehab (3hours/day)    Assistance Recommended at Discharge Intermittent Supervision/Assistance  Functional Status Assessment Patient has had a recent decline in their functional status and demonstrates the ability to make significant improvements in function in a reasonable and predictable amount of time.  Equipment Recommendations  Wheelchair (measurements PT);Wheelchair cushion (measurements PT);BSC/3in1    Recommendations for Other Services Rehab consult     Precautions / Restrictions Precautions Precautions: Fall Precaution Comments: watch HR and SpO2      Mobility  Bed Mobility Overal bed mobility: Needs Assistance Bed Mobility: Rolling;Sidelying to Sit;Sit to Supine Rolling: Mod assist Sidelying to sit: Mod assist;HOB  elevated;Max assist   Sit to supine: Max assist   General bed mobility comments: cues for technique, assist with rail to roll and for legs off bed and trunk upright to sit pt using rail; to supine assist for legs and trunk    Transfers Overall transfer level: Needs assistance   Transfers: Bed to chair/wheelchair/BSC            Lateral/Scoot Transfers: Max assist General transfer comment: attempting sliding up toward HOB along EOB performed x 2 then HR up to 131, SpO2 down to 82 so allowed to recover    Ambulation/Gait                  Stairs            Wheelchair Mobility    Modified Rankin (Stroke Patients Only)       Balance Overall balance assessment: Needs assistance Sitting-balance support: Feet supported Sitting balance-Leahy Scale: Fair Sitting balance - Comments: sat EOB about 10-15 minutes focus on balance, activity tolerance pursed lip breathing and discussing plans for follow up PT                                     Pertinent Vitals/Pain Pain Assessment: Faces Faces Pain Scale: Hurts even more Pain Location: lower back and L LE Pain Descriptors / Indicators: Aching;Discomfort Pain Intervention(s): Monitored during session;RN gave pain meds during session;Limited activity within patient's tolerance    Home Living Family/patient expects to be discharged to:: Private residence Living Arrangements: Spouse/significant other Available Help at Discharge: Family Type of Home: House Home Access: Stairs to enter   Technical brewer of Steps: 1   Home Layout: One level Home Equipment: Rolling  Walker (2 wheels)      Prior Function Prior Level of Function : Needs assist             Mobility Comments: since recent hospitalization wife helping to sponge bathe, and he has been using a walker, previous to illness was completely independent       Hand Dominance        Extremity/Trunk Assessment   Upper Extremity  Assessment Upper Extremity Assessment: LUE deficits/detail LUE Deficits / Details: reports L shoulder pain previous to illness with work up scheduled but not completed due to illness; elevates with assist only; elbow strength grossly 4-/5 LUE: Shoulder pain with ROM    Lower Extremity Assessment Lower Extremity Assessment: LLE deficits/detail;RLE deficits/detail RLE Deficits / Details: AROM WFL, strength grossly 2+/5 hip flexion, 4-/5 knee extension LLE Deficits / Details: AAROM WFL, strength hip flexion 2-/5, knee extension 3-/5    Cervical / Trunk Assessment Cervical / Trunk Assessment: Other exceptions Cervical / Trunk Exceptions: recent abdominal surgery  Communication   Communication: No difficulties  Cognition Arousal/Alertness: Awake/alert Behavior During Therapy: WFL for tasks assessed/performed Overall Cognitive Status: Within Functional Limits for tasks assessed                                          General Comments General comments (skin integrity, edema, etc.): wife in the room and super supportive; SpO2 77% at lowest on RA with activity improved to 90's in <2 minutes with rest, HR max 132 with a-fib    Exercises     Assessment/Plan    PT Assessment Patient needs continued PT services  PT Problem List Decreased strength;Decreased activity tolerance;Decreased safety awareness;Decreased balance;Decreased mobility;Cardiopulmonary status limiting activity       PT Treatment Interventions DME instruction;Balance training;Gait training;Functional mobility training;Patient/family education;Therapeutic activities;Therapeutic exercise    PT Goals (Current goals can be found in the Care Plan section)  Acute Rehab PT Goals Patient Stated Goal: agrees to rehab PT Goal Formulation: With patient/family Time For Goal Achievement: 09/27/21 Potential to Achieve Goals: Good    Frequency Min 3X/week   Barriers to discharge        Co-evaluation                AM-PAC PT "6 Clicks" Mobility  Outcome Measure Help needed turning from your back to your side while in a flat bed without using bedrails?: A Lot Help needed moving from lying on your back to sitting on the side of a flat bed without using bedrails?: Total Help needed moving to and from a bed to a chair (including a wheelchair)?: Total Help needed standing up from a chair using your arms (e.g., wheelchair or bedside chair)?: Total Help needed to walk in hospital room?: Total Help needed climbing 3-5 steps with a railing? : Total 6 Click Score: 7    End of Session   Activity Tolerance: Patient limited by fatigue Patient left: in bed;with bed alarm set;with call bell/phone within reach;with family/visitor present   PT Visit Diagnosis: Other abnormalities of gait and mobility (R26.89);Muscle weakness (generalized) (M62.81)    Time: 5573-2202 PT Time Calculation (min) (ACUTE ONLY): 36 min   Charges:   PT Evaluation $PT Eval Moderate Complexity: 1 Mod PT Treatments $Therapeutic Activity: 8-22 mins        Magda Kiel, PT Acute Rehabilitation Services [facility]:[phone] Office:[phone] 09/13/2021   Reginia Naas 09/13/2021,  6:16 PM

## 2021-09-13 NOTE — Progress Notes (Signed)
Patient experiencing nausea and vomiting. Zofran given. HR 140-150s. Metoprolol IV given.  Daymon Larsen, RN

## 2021-09-13 NOTE — Progress Notes (Signed)
? ?  Inpatient Rehab Admissions Coordinator : ? ?Per therapy recommendations, patient was screened for CIR candidacy by Cailey Trigueros RN MSN.  At this time patient appears to be a potential candidate for CIR. I will place a rehab consult per protocol for full assessment. Please call me with any questions. ? ?Teaghan Melrose RN MSN ?Admissions Coordinator ?[phone] ?  ?

## 2021-09-14 ENCOUNTER — Inpatient Hospital Stay (HOSPITAL_COMMUNITY): Payer: Medicare Other | Admitting: Certified Registered"

## 2021-09-14 ENCOUNTER — Encounter (HOSPITAL_COMMUNITY): Payer: Self-pay | Admitting: Internal Medicine

## 2021-09-14 ENCOUNTER — Inpatient Hospital Stay (HOSPITAL_COMMUNITY): Payer: Medicare Other

## 2021-09-14 ENCOUNTER — Encounter (HOSPITAL_COMMUNITY)
Admission: EM | Disposition: A | Discharge status: Rehabilitation Facility | Payer: Self-pay | Source: Home / Self Care | Attending: Internal Medicine

## 2021-09-14 DIAGNOSIS — R7881 Bacteremia: Secondary | ICD-10-CM

## 2021-09-14 DIAGNOSIS — I361 Nonrheumatic tricuspid (valve) insufficiency: Secondary | ICD-10-CM

## 2021-09-14 HISTORY — PX: TEE WITHOUT CARDIOVERSION: SHX5443

## 2021-09-14 LAB — BASIC METABOLIC PANEL
Anion gap: 8 (ref 5–15)
BUN: 12 mg/dL (ref 8–23)
CO2: 20 mmol/L — ABNORMAL LOW (ref 22–32)
Calcium: 7.9 mg/dL — ABNORMAL LOW (ref 8.9–10.3)
Chloride: 106 mmol/L (ref 98–111)
Creatinine, Ser: 0.77 mg/dL (ref 0.61–1.24)
GFR, Estimated: >60 mL/min (ref >60)
Glucose, Bld: 93 mg/dL (ref 70–99)
Potassium: 3.5 mmol/L (ref 3.5–5.1)
Sodium: 134 mmol/L — ABNORMAL LOW (ref 135–145)

## 2021-09-14 LAB — CBC
HCT: 31.4 % — ABNORMAL LOW (ref 39.0–52.0)
Hemoglobin: 10 g/dL — ABNORMAL LOW (ref 13.0–17.0)
MCH: 30.2 pg (ref 26.0–34.0)
MCHC: 31.8 g/dL (ref 30.0–36.0)
MCV: 94.9 fL (ref 80.0–100.0)
Platelets: 176 10*3/uL (ref 150–400)
RBC: 3.31 MIL/uL — ABNORMAL LOW (ref 4.22–5.81)
RDW: 17.6 % — ABNORMAL HIGH (ref 11.5–15.5)
WBC: 5.5 10*3/uL (ref 4.0–10.5)
nRBC: 0 % (ref 0.0–0.2)

## 2021-09-14 SURGERY — ECHOCARDIOGRAM, TRANSESOPHAGEAL
Anesthesia: General

## 2021-09-14 MED ORDER — PROPOFOL 10 MG/ML IV BOLUS
INTRAVENOUS | Status: DC | PRN
  Administered 2021-09-14 (×2): 20 mg via INTRAVENOUS

## 2021-09-14 MED ORDER — PROPOFOL 500 MG/50ML IV EMUL
INTRAVENOUS | Status: DC | PRN
  Administered 2021-09-14: 100 ug/kg/min via INTRAVENOUS

## 2021-09-14 MED ORDER — LIDOCAINE 2% (20 MG/ML) 5 ML SYRINGE
INTRAMUSCULAR | Status: DC | PRN
  Administered 2021-09-14: 40 mg via INTRAVENOUS

## 2021-09-14 MED ORDER — SODIUM CHLORIDE 0.9 % IV SOLN: INTRAVENOUS | Status: DC

## 2021-09-14 MED ORDER — POTASSIUM CHLORIDE CRYS ER 20 MEQ PO TBCR
40.0000 meq | EXTENDED_RELEASE_TABLET | Freq: Once | ORAL | Status: DC
  Filled 2021-09-14: qty 2

## 2021-09-14 NOTE — Progress Notes (Signed)
  Echocardiogram Echocardiogram Transesophageal has been performed.  Leon Lyons F 09/14/2021, 2:22 PM

## 2021-09-14 NOTE — Evaluation (Signed)
Occupational Therapy Evaluation Patient Details Name: Leon Lyons MRN: [id number] DOB: 08/15/1933 Today's Date: 09/14/2021   History of Present Illness 87 yo male with the past medical history of lung cancer, with recent diagnosis of atrial fibrillation and pulmonary embolism, after a abdominal surgery for volvulus admitted due to GI bleed from volvulus anastomosis and symptomatic anemia.  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   Clinical Impression   PTA, pt lives with spouse and reports typically able to complete ADLs and mobility with RW though wife assisting prn with recent illness. Pt reports previously enjoying yard work. Pt presents now with deficits in cardiopulmonary tolerance, strength, balance and overall endurance. Pt able to demo bed mobility with Mod A and reports improved ability to move BLE this AM. Further OOB mobility limited d/t a fib with HR up to 175bpm, so assisted back to bed. Pt denies symptoms w/ af ib. Educated re: energy conservation, bed level exercises and monitoring of vitals. Anticipate pt to progress quickly with HR improvements and comprehensive therapies. Rec CIR to maximize safety and independence with ADLs/mobility.       Recommendations for follow up therapy are one component of a multi-disciplinary discharge planning process, led by the attending physician.  Recommendations may be updated based on patient status, additional functional criteria and insurance authorization.   Follow Up Recommendations  Acute inpatient rehab (3hours/day)    Assistance Recommended at Discharge Frequent or constant Supervision/Assistance  Functional Status Assessment  Patient has had a recent decline in their functional status and demonstrates the ability to make significant improvements in function in a reasonable and predictable amount of time.  Equipment Recommendations  Other (comment) (TBD; pending  progression)    Recommendations for Other Services       Precautions / Restrictions Precautions Precautions: Fall Precaution Comments: watch HR and SpO2 Restrictions Weight Bearing Restrictions: No      Mobility Bed Mobility Overal bed mobility: Needs Assistance Bed Mobility: Rolling;Supine to Sit;Sit to Sidelying Rolling: Supervision   Supine to sit: Mod assist;HOB elevated   Sit to sidelying: Mod assist General bed mobility comments: Mod A to get to EOB, assist to get BLE fully off of bed and light assist to lift trunk, pt able to use bedrail to assist. Guided pt in sidelying to return to bed with assist to get BLE fully up in bed. Able to roll with supervision to fix pad underneath    Transfers Overall transfer level: Needs assistance                 General transfer comment: scooting along bedside with Mod A and momentum. deferred standing d/t elevated HR      Balance Overall balance assessment: Needs assistance Sitting-balance support: Feet supported Sitting balance-Leahy Scale: Fair                                     ADL either performed or assessed with clinical judgement   ADL Overall ADL's : Needs assistance/impaired Eating/Feeding: Set up;Sitting   Grooming: Set up;Sitting   Upper Body Bathing: Minimal assistance;Sitting Upper Body Bathing Details (indicate cue type and reason): to bathe back sitting EOB Lower Body Bathing: Maximal assistance;Sitting/lateral leans   Upper Body Dressing : Minimal assistance;Sitting   Lower Body Dressing: Maximal assistance;Sitting/lateral leans;Bed level       Toileting- Clothing Manipulation and  Hygiene: Maximal assistance;Sitting/lateral lean;Bed level         General ADL Comments: Limited in progression OOB d/t a fib with increasingly tachycardic readings, able to report and demo improved ability to lift BLE, problem solve bed mobility well though fatigued with tasks.     Vision  Ability to See in Adequate Light: 0 Adequate Patient Visual Report: No change from baseline Vision Assessment?: No apparent visual deficits     Perception     Praxis      Pertinent Vitals/Pain Pain Assessment: No/denies pain     Hand Dominance Right   Extremity/Trunk Assessment Upper Extremity Assessment Upper Extremity Assessment: LUE deficits/detail LUE Deficits / Details: mild shoulder pain with flexion, able to reach functionally across body to bedrails   Lower Extremity Assessment Lower Extremity Assessment: Defer to PT evaluation   Cervical / Trunk Assessment Cervical / Trunk Assessment: Other exceptions Cervical / Trunk Exceptions: recent abdominal surgery   Communication Communication Communication: Other (comment) (slurred speech at times)   Cognition Arousal/Alertness: Awake/alert Behavior During Therapy: WFL for tasks assessed/performed Overall Cognitive Status: Within Functional Limits for tasks assessed                                       General Comments  HR variable throughout session, up to 175bpm sitting EOB, denies symptoms. SpO2 85% on RA with prolonged sititng. Cued for pursed lip breathing, education on vitals and reasons to avoid strenuous activity during session with pt actively engaged    Exercises     Shoulder Instructions      Home Living Family/patient expects to be discharged to:: Private residence Living Arrangements: Spouse/significant other Available Help at Discharge: Family Type of Home: House Home Access: Stairs to enter Technical brewer of Steps: 1   Home Layout: One level               [place]: Conservation officer, nature (2 wheels);Rollator (4 wheels)   Additional Comments: reports his wife ordered a shower chair online recently      Prior Functioning/Environment Prior Level of Function : Needs assist       Physical Assist : ADLs (physical)   ADLs (physical): Bathing;IADLs Mobility Comments:  pt reports recently using RW for mobility ADLs Comments: reports recent assist with sponge bathing from wife. typically Independent with ADLs, standing for showers and enjoyed working out in yard        OT Problem List: Decreased strength;Decreased activity tolerance;Impaired balance (sitting and/or standing);Cardiopulmonary status limiting activity      OT Treatment/Interventions: Self-care/ADL training;Therapeutic exercise;Energy conservation;DME and/or AE instruction;Therapeutic activities;Balance training;Patient/family education    OT Goals(Current goals can be found in the care plan section) Acute Rehab OT Goals Patient Stated Goal: regain strength OT Goal Formulation: With patient Time For Goal Achievement: 09/28/21 Potential to Achieve Goals: Good  OT Frequency: Min 2X/week   Barriers to D/C:            Co-evaluation              AM-PAC OT "6 Clicks" Daily Activity     Outcome Measure Help from another person eating meals?: A Little Help from another person taking care of personal grooming?: A Little Help from another person toileting, which includes using toliet, bedpan, or urinal?: A Lot Help from another person bathing (including washing, rinsing, drying)?: A Lot Help from another person to put on and taking  off regular upper body clothing?: A Little Help from another person to put on and taking off regular lower body clothing?: A Lot 6 Click Score: 15   End of Session Equipment Utilized During Treatment: Oxygen Nurse Communication: Mobility status;Other (comment) (HR)  Activity Tolerance: Treatment limited secondary to medical complications (Comment) Patient left: in bed;with call bell/phone within reach;with bed alarm set  OT Visit Diagnosis: Unsteadiness on feet (R26.81);Other abnormalities of gait and mobility (R26.89);Muscle weakness (generalized) (M62.81)                Time: 0601-5615 OT Time Calculation (min): 23 min Charges:  OT General  Charges $OT Visit: 1 Visit OT Evaluation $OT Eval Moderate Complexity: 1 Mod  Malachy Chamber, OTR/L Acute Rehab Services Office: [phone]   Layla Maw 09/14/2021, 8:24 AM

## 2021-09-14 NOTE — Anesthesia Preprocedure Evaluation (Signed)
Anesthesia Evaluation  Patient identified by MRN, date of birth, ID band Patient awake    Reviewed: Allergy & Precautions, NPO status , Patient's Chart, lab work & pertinent test results  Airway Mallampati: II  TM Distance: >3 FB     Dental   Pulmonary former smoker,  History noted Dr. Nyoka Cowden   breath sounds clear to auscultation       Cardiovascular  Rhythm:Regular Rate:Normal  History noted Dr. Nyoka Cowden   Neuro/Psych    GI/Hepatic negative GI ROS, Neg liver ROS,   Endo/Other  negative endocrine ROS  Renal/GU negative Renal ROS     Musculoskeletal   Abdominal   Peds  Hematology   Anesthesia Other Findings   Reproductive/Obstetrics                             Anesthesia Physical Anesthesia Plan  ASA: 3  Anesthesia Plan: General   Post-op Pain Management:    Induction: Intravenous  PONV Risk Score and Plan: Treatment may vary due to age or medical condition and Propofol infusion  Airway Management Planned: Simple Face Mask and Nasal Cannula  Additional Equipment:   Intra-op Plan:   Post-operative Plan:   Informed Consent: I have reviewed the patients History and Physical, chart, labs and discussed the procedure including the risks, benefits and alternatives for the proposed anesthesia with the patient or authorized representative who has indicated his/her understanding and acceptance.     Dental advisory given  Plan Discussed with: CRNA and Anesthesiologist  Anesthesia Plan Comments:         Anesthesia Quick Evaluation

## 2021-09-14 NOTE — Care Management Important Message (Signed)
Important Message  Patient Details  Name: Leon Lyons MRN: [id number] Date of Birth: 1933-03-14   Medicare Important Message Given:  Yes     Shelda Altes 09/14/2021, 10:16 AM

## 2021-09-14 NOTE — Anesthesia Postprocedure Evaluation (Signed)
Anesthesia Post Note  Patient: Leon Lyons  Procedure(s) Performed: TRANSESOPHAGEAL ECHOCARDIOGRAM (TEE)     Patient location during evaluation: PACU Anesthesia Type: General Level of consciousness: awake Pain management: pain level controlled Vital Signs Assessment: post-procedure vital signs reviewed and stable Respiratory status: spontaneous breathing Cardiovascular status: stable Postop Assessment: no apparent nausea or vomiting Anesthetic complications: no   No notable events documented.  Last Vitals:  Vitals:   09/14/21 1415 09/14/21 1425  BP: (!) 95/47 (!) 95/42  Pulse: 86 80  Resp: 13 13  Temp: (!) 36.2 C   SpO2: 96% 97%    Last Pain:  Vitals:   09/14/21 1425  TempSrc:   PainSc: 0-No pain                 Orva Gwaltney

## 2021-09-14 NOTE — Progress Notes (Signed)
PROGRESS NOTE    Leon Lyons  OXB:[id number] DOB: [birth year] DOA: 09/09/2021 PCP: [name], MD    Brief Narrative:  87 year old gentleman with history of metastatic lung cancer stage IIIb, recent diagnosis of A. fib and pulmonary embolism after an abdominal surgery for volvulus presented with generalized weakness and shortness of breath on exertion.  In the emergency room, chest x-ray with left upper lobe infiltrate and left pleural effusion.  CT scan of the chest with left perihilar postradiation changes and fibrosis groundglass opacities, right fifth rib metastatic lesion, right lower lobe pulmonary embolus with pulmonary infarction.  CT scan of the abdomen with right iliofemoral DVT.  Contained aortic leak at the aortic bifurcation with periaortic hematoma.  Hemoglobin 7.4. Remains in the hospital with MSSA bacteremia, vascular intervention.   Assessment & Plan:   Principal Problem:   Symptomatic anemia Active Problems:   Non-small cell carcinoma of left lung, stage 3 (HCC)   Pleural effusion on left   Hyponatremia   Persistent atrial fibrillation (HCC)   Pulmonary embolism (HCC)   Protein-calorie malnutrition, severe (HCC)   Pleural effusion   Bacteremia  Acute symptomatic anemia due to surgical colonic anastomosis bleeding: Acute on chronic anemia.  Anemia of chronic disease including metastatic lung cancer. -Received total 3 units of PRBC transfusion.  Hemoglobin appropriately responded and 10 today.  GI recommended conservative management.  No evidence of active bleeding now.  Aortic leak now status post endovascular repair: Vascular following.  Currently tolerating anticoagulation with Lovenox.  Vascular surgery plans to recheck CT scan in 4 weeks.  A. fib with RVR: Currently rate controlled on diltiazem and metoprolol.  Therapeutic on Lovenox.  MSSA bacteremia: Initial blood cultures 12/2 positive for MSSA.  Repeat blood cultures negative.  TTE without evidence  of vegetation. Scheduled for TEE today.  Followed by ID.  Remains on cefazolin antibiotic therapy. Left knee MRI today as per ID with tenderness along the suprapatellar area.  Stage III non-small cell lung cancer/pulmonary embolism/right iliofemoral DVT: Proximal DVT and pulmonary embolism in a patient with metastatic lung cancer usually associated with poor prognosis. Seen by oncology in the hospital, they plan to follow-up outpatient once his infection is improved. Will keep patient on Lovenox, currently tolerating. Patient is overwhelmed with diagnosis and multiple issues, he will benefit with palliative discussion.  Will consult palliative care.  Patient and wife agreed.  Hypokalemia: Replace and monitor levels.   DVT prophylaxis: SCD's Start: 09/11/21 1206 SCDs Start: 09/09/21 1609   Code Status: Full code Family Communication: Wife at the bedside Disposition Plan: Status is: Inpatient  Remains inpatient appropriate because: Inpatient procedure planned.  Patient on IV antibiotics.         Consultants:  Hematology oncology Vascular Palliative  Procedures:  Multiple procedures as above.  Antimicrobials:  Cefazolin 12/2---   Subjective: Patient seen and examined in the morning rounds.  Wife was at the bedside.  Patient is very overwhelmed with everything going on. I tried to counsel patient and wife at the bedside.  We discussed about extensive thromboembolism, currently he is not able to get chemotherapy for his lung cancer because of acute infection and need for rehab.  Patient was appreciated. We discussed about palliative team involvement and they agreed.  Objective: Vitals:   09/14/21 1330 09/14/21 1415 09/14/21 1425 09/14/21 1435  BP: (!) 96/50 (!) 95/47 (!) 95/42 (!) 92/49  Pulse: 89 86 80 82  Resp:  13 13 14   Temp: 97.7 F (36.5 C) (!)  97.2 F (36.2 C)    TempSrc: Other (Comment) Temporal    SpO2: 95% 96% 97% 92%  Weight: 75.3 kg     Height: 5\' 10"   (1.778 m)       Intake/Output Summary (Last 24 hours) at 09/14/2021 1447 Last data filed at 09/14/2021 1330 Gross per 24 hour  Intake 440 ml  Output 1225 ml  Net -785 ml   Filed Weights   09/10/21 0954 09/11/21 0002 09/14/21 1330  Weight: 74.8 kg 75.3 kg 75.3 kg    Examination:  General exam: Appears comfortable at rest.  He is anxious and frail and debilitated.  Chronically sick looking. Respiratory system: Clear to auscultation. Respiratory effort normal.  Poor air entry bilateral base.  No other added sounds. He was on 2 L oxygen on my evaluation. Cardiovascular system: S1 & S2 heard, irregularly irregular.   Gastrointestinal system: Abdomen is nondistended, soft and nontender. No organomegaly or masses felt. Normal bowel sounds heard. Central nervous system: Alert and oriented. No focal neurological deficits.  Generalized weakness. Extremities: Symmetric 5 x 5 power. Patient has a small tender spot on left prepatellar area.  No evidence of joint effusion.    Data Reviewed: I have personally reviewed following labs and imaging studies  CBC: Recent Labs  Lab 09/09/21 1248 09/10/21 0833 09/11/21 0830 09/11/21 0957 09/12/21 0136 09/14/21 0200  WBC 10.5 9.1  --  8.1 10.9* 5.5  NEUTROABS 9.0*  --   --   --   --   --   HGB 7.4* 8.0* 9.5* 9.7* 10.6* 10.0*  HCT 24.0* 25.0* 28.0* 30.3* 33.5* 31.4*  MCV 98.0 96.2  --  93.2 92.8 94.9  PLT 244 222  --  160 140* 258   Basic Metabolic Panel: Recent Labs  Lab 09/09/21 1248 09/10/21 0510 09/11/21 0830 09/11/21 0957 09/12/21 0136 09/14/21 0200  NA 129* 131* 138 135 134* 134*  K 3.7 4.1 3.3* 3.3* 4.4 3.5  CL 98 105  --  107 105 106  CO2 25 23  --  24 22 20*  GLUCOSE 130* 87  --  106* 117* 93  BUN 21 17  --  10 11 12   CREATININE 0.85 0.76  --  0.66 0.78 0.77  CALCIUM 8.3* 7.8*  --  8.1* 8.3* 7.9*  MG  --   --   --  1.9  --   --   PHOS  --  3.0  --   --   --   --    GFR: Estimated Creatinine Clearance: 68.4 mL/min (by  C-G formula based on SCr of 0.77 mg/dL). Liver Function Tests: Recent Labs  Lab 09/09/21 1248 09/10/21 0510  AST 14*  --   ALT 13  --   ALKPHOS 83  --   BILITOT 0.3  --   PROT 6.6  --   ALBUMIN 1.8* 1.6*   No results for input(s): LIPASE, AMYLASE in the last 168 hours. No results for input(s): AMMONIA in the last 168 hours. Coagulation Profile: Recent Labs  Lab 09/09/21 1248 09/11/21 0957  INR 1.2 1.4*   Cardiac Enzymes: No results for input(s): CKTOTAL, CKMB, CKMBINDEX, TROPONINI in the last 168 hours. BNP (last 3 results) No results for input(s): PROBNP in the last 8760 hours. HbA1C: No results for input(s): HGBA1C in the last 72 hours. CBG: No results for input(s): GLUCAP in the last 168 hours. Lipid Profile: No results for input(s): CHOL, HDL, LDLCALC, TRIG, CHOLHDL, LDLDIRECT in the last 72  hours. Thyroid Function Tests: No results for input(s): TSH, T4TOTAL, FREET4, T3FREE, THYROIDAB in the last 72 hours. Anemia Panel: No results for input(s): VITAMINB12, FOLATE, FERRITIN, TIBC, IRON, RETICCTPCT in the last 72 hours. Sepsis Labs: Recent Labs  Lab 09/09/21 1248 09/10/21 0510 09/11/21 0335  PROCALCITON  --  0.62 0.47  LATICACIDVEN 1.8  --   --     Recent Results (from the past 240 hour(s))  Blood Culture (routine x 2)     Status: Abnormal   Collection Time: 09/09/21 12:48 PM   Specimen: BLOOD  Result Value Ref Range Status   Specimen Description   Final    BLOOD LEFT ANTECUBITAL Performed at [facility] [address] [place], [place] [address]    Special Requests   Final    BOTTLES DRAWN AEROBIC AND ANAEROBIC Blood Culture results may not be optimal due to an excessive volume of blood received in culture bottles Performed at [facility] [address] [place], [place] [address]    Culture  Setup Time   Final    GRAM POSITIVE COCCI IN BOTH AEROBIC AND ANAEROBIC BOTTLES CRITICAL RESULT CALLED TO, READ BACK BY  AND VERIFIED WITH: PHARMD ELLEN JACKSON 09/10/21@5 :10 BY TW Performed at [facility], [place] [address], [place], [place] [address]    Culture STAPHYLOCOCCUS AUREUS (A)  Final   Report Status 09/12/2021 FINAL  Final   Organism ID, Bacteria STAPHYLOCOCCUS AUREUS  Final      Susceptibility   Staphylococcus aureus - MIC*    CIPROFLOXACIN <=0.5 SENSITIVE Sensitive     ERYTHROMYCIN <=0.25 SENSITIVE Sensitive     GENTAMICIN <=0.5 SENSITIVE Sensitive     OXACILLIN 0.5 SENSITIVE Sensitive     TETRACYCLINE <=1 SENSITIVE Sensitive     VANCOMYCIN <=0.5 SENSITIVE Sensitive     TRIMETH/SULFA <=10 SENSITIVE Sensitive     CLINDAMYCIN <=0.25 SENSITIVE Sensitive     RIFAMPIN <=0.5 SENSITIVE Sensitive     Inducible Clindamycin NEGATIVE Sensitive     * STAPHYLOCOCCUS AUREUS  Resp Panel by RT-PCR (Flu A&B, Covid) Nasopharyngeal Swab     Status: None   Collection Time: 09/09/21 12:48 PM   Specimen: Nasopharyngeal Swab; Nasopharyngeal(NP) swabs in vial transport medium  Result Value Ref Range Status   SARS Coronavirus 2 by RT PCR NEGATIVE NEGATIVE Final    Comment: (NOTE) SARS-CoV-2 target nucleic acids are NOT DETECTED.  The SARS-CoV-2 RNA is generally detectable in upper respiratory specimens during the acute phase of infection. The lowest concentration of SARS-CoV-2 viral copies this assay can detect is 138 copies/mL. A negative result does not preclude SARS-Cov-2 infection and should not be used as the sole basis for treatment or other patient management decisions. A negative result may occur with  improper specimen collection/handling, submission of specimen other than nasopharyngeal swab, presence of viral mutation(s) within the areas targeted by this assay, and inadequate number of viral copies(<138 copies/mL). A negative result must be combined with clinical observations, patient history, and epidemiological information. The expected result is Negative.  Fact Sheet for Patients:   EntrepreneurPulse.com.au  Fact Sheet for Healthcare Providers:  IncredibleEmployment.be  This test is no t yet approved or cleared by the [place] FDA and  has been authorized for detection and/or diagnosis of SARS-CoV-2 by FDA under an Emergency Use Authorization (EUA). This EUA will remain  in effect (meaning this test can be used) for the duration of the COVID-19 declaration under Section 564(b)(1) of the Act, 21  U.S.C.section 360bbb-3(b)(1), unless the authorization is terminated  or revoked sooner.       Influenza A by PCR NEGATIVE NEGATIVE Final   Influenza B by PCR NEGATIVE NEGATIVE Final    Comment: (NOTE) The Xpert Xpress SARS-CoV-2/FLU/RSV plus assay is intended as an aid in the diagnosis of influenza from Nasopharyngeal swab specimens and should not be used as a sole basis for treatment. Nasal washings and aspirates are unacceptable for Xpert Xpress SARS-CoV-2/FLU/RSV testing.  Fact Sheet for Patients: EntrepreneurPulse.com.au  Fact Sheet for Healthcare Providers: IncredibleEmployment.be  This test is not yet approved or cleared by the [place] FDA and has been authorized for detection and/or diagnosis of SARS-CoV-2 by FDA under an Emergency Use Authorization (EUA). This EUA will remain in effect (meaning this test can be used) for the duration of the COVID-19 declaration under Section 564(b)(1) of the Act, 21 U.S.C. section 360bbb-3(b)(1), unless the authorization is terminated or revoked.  Performed at [facility], [place] [address], [place], [place] [address]   Blood Culture ID Panel (Reflexed)     Status: Abnormal   Collection Time: 09/09/21 12:48 PM  Result Value Ref Range Status   Enterococcus faecalis NOT DETECTED NOT DETECTED Final   Enterococcus Faecium NOT DETECTED NOT DETECTED Final   Listeria monocytogenes NOT DETECTED NOT DETECTED Final    Staphylococcus species DETECTED (A) NOT DETECTED Final    Comment: CRITICAL RESULT CALLED TO, READ BACK BY AND VERIFIED WITH: PHARMD ELLEN JACKSON 09/10/21@5 :10 BY TW    Staphylococcus aureus (BCID) DETECTED (A) NOT DETECTED Final    Comment: CRITICAL RESULT CALLED TO, READ BACK BY AND VERIFIED WITH: PHARMD ELLEN JACKSON 09/10/21@5 :10 BY TW    Staphylococcus epidermidis NOT DETECTED NOT DETECTED Final   Staphylococcus lugdunensis NOT DETECTED NOT DETECTED Final   Streptococcus species NOT DETECTED NOT DETECTED Final   Streptococcus agalactiae NOT DETECTED NOT DETECTED Final   Streptococcus pneumoniae NOT DETECTED NOT DETECTED Final   Streptococcus pyogenes NOT DETECTED NOT DETECTED Final   A.calcoaceticus-baumannii NOT DETECTED NOT DETECTED Final   Bacteroides fragilis NOT DETECTED NOT DETECTED Final   Enterobacterales NOT DETECTED NOT DETECTED Final   Enterobacter cloacae complex NOT DETECTED NOT DETECTED Final   Escherichia coli NOT DETECTED NOT DETECTED Final   Klebsiella aerogenes NOT DETECTED NOT DETECTED Final   Klebsiella oxytoca NOT DETECTED NOT DETECTED Final   Klebsiella pneumoniae NOT DETECTED NOT DETECTED Final   Proteus species NOT DETECTED NOT DETECTED Final   Salmonella species NOT DETECTED NOT DETECTED Final   Serratia marcescens NOT DETECTED NOT DETECTED Final   Haemophilus influenzae NOT DETECTED NOT DETECTED Final   Neisseria meningitidis NOT DETECTED NOT DETECTED Final   Pseudomonas aeruginosa NOT DETECTED NOT DETECTED Final   Stenotrophomonas maltophilia NOT DETECTED NOT DETECTED Final   Candida albicans NOT DETECTED NOT DETECTED Final   Candida auris NOT DETECTED NOT DETECTED Final   Candida glabrata NOT DETECTED NOT DETECTED Final   Candida krusei NOT DETECTED NOT DETECTED Final   Candida parapsilosis NOT DETECTED NOT DETECTED Final   Candida tropicalis NOT DETECTED NOT DETECTED Final   Cryptococcus neoformans/gattii NOT DETECTED NOT DETECTED Final   Meth  resistant mecA/C and MREJ NOT DETECTED NOT DETECTED Final    Comment: Performed at [facility], [address], [place], [place] [address]  Blood Culture (routine x 2)     Status: Abnormal   Collection Time: 09/09/21 12:53 PM   Specimen: BLOOD  Result Value Ref Range Status  Specimen Description   Final    BLOOD RIGHT ANTECUBITAL Performed at [facility] [address] [place], [place] [address]    Special Requests   Final    BOTTLES DRAWN AEROBIC AND ANAEROBIC Blood Culture adequate volume Performed at [facility] [address], [place], [place] [address]    Culture  Setup Time   Final    GRAM POSITIVE COCCI IN BOTH AEROBIC AND ANAEROBIC BOTTLES CRITICAL VALUE NOTED.  VALUE IS CONSISTENT WITH PREVIOUSLY REPORTED AND CALLED VALUE.    Culture (A)  Final    STAPHYLOCOCCUS AUREUS SUSCEPTIBILITIES PERFORMED ON PREVIOUS CULTURE WITHIN THE LAST 5 DAYS. Performed at [facility], [place] [address], [place], [place] [address]    Report Status 09/12/2021 FINAL  Final  Urine Culture     Status: None   Collection Time: 09/09/21  6:46 PM   Specimen: In/Out Cath Urine  Result Value Ref Range Status   Specimen Description   Final    IN/OUT CATH URINE Performed at [facility] [address] [place], [place] [address]    Special Requests   Final    NONE Performed at [facility], [place] [address], [place], [place] [address]    Culture   Final    NO GROWTH Performed at [facility], [place] [address] [place], [place] [address]    Report Status 09/11/2021 FINAL  Final  Culture, blood (routine x 2)     Status: None (Preliminary result)   Collection Time: 09/11/21  3:11 AM   Specimen: BLOOD RIGHT WRIST  Result Value Ref Range Status   Specimen Description BLOOD RIGHT WRIST  Final   Special Requests   Final    BOTTLES DRAWN AEROBIC AND ANAEROBIC Blood Culture adequate volume   Culture   Final     NO GROWTH 3 DAYS Performed at [facility], [place] [address], [place], [place] [address]    Report Status PENDING  Incomplete  Culture, blood (routine x 2)     Status: None (Preliminary result)   Collection Time: 09/11/21  3:11 AM   Specimen: BLOOD LEFT HAND  Result Value Ref Range Status   Specimen Description BLOOD LEFT HAND  Final   Special Requests   Final    BOTTLES DRAWN AEROBIC ONLY Blood Culture adequate volume   Culture   Final    NO GROWTH 3 DAYS Performed at [facility], [place] [address] [place], [place] [address]    Report Status PENDING  Incomplete  Surgical pcr screen     Status: Abnormal   Collection Time: 09/11/21  6:27 AM   Specimen: Nasal Mucosa; Nasal Swab  Result Value Ref Range Status   MRSA, PCR NEGATIVE NEGATIVE Final   Staphylococcus aureus POSITIVE (A) NEGATIVE Final    Comment: (NOTE) The Xpert SA Assay (FDA approved for NASAL specimens in patients 43 years of age and older), is one component of a comprehensive surveillance program. It is not intended to diagnose infection nor to guide or monitor treatment. Performed at [facility], [place] [address], [place], [place] [address]          Radiology Studies: No results found.      Scheduled Meds:  [MAR Hold] sodium chloride   Intravenous Once   [MAR Hold] Chlorhexidine Gluconate Cloth  6 each Topical Daily   [MAR Hold] Chlorhexidine Gluconate Cloth  6 each Topical Q0600   [  MAR Hold] diltiazem  120 mg Oral Daily   [MAR Hold] docusate sodium  100 mg Oral Daily   [MAR Hold] enoxaparin (LOVENOX) injection  70 mg Subcutaneous Q12H   [MAR Hold] feeding supplement  237 mL Oral BID BM   [MAR Hold] folic acid  1 mg Oral Daily   [MAR Hold] metoprolol succinate  25 mg Oral Daily   [MAR Hold] multivitamin with minerals  1 tablet Oral Daily   [MAR Hold] mupirocin ointment  1 application Nasal BID   [MAR Hold] pantoprazole  40 mg Oral Daily   [MAR Hold] potassium chloride  40 mEq Oral  Once   [MAR Hold] rosuvastatin  5 mg Oral Daily   [MAR Hold] sodium chloride  1 spray Each Nare Once   Continuous Infusions:  sodium chloride     sodium chloride     [MAR Hold]  ceFAZolin (ANCEF) IV 2 g (09/14/21 6168)     LOS: 4 days    Time spent: 30 minutes    Barb Merino, MD [facility] Pager [phone]

## 2021-09-14 NOTE — Progress Notes (Signed)
[facility] for Infectious Disease  Date of Admission:  09/09/2021   Total days of inpatient antibiotics 5  Principal Problem:   Symptomatic anemia Active Problems:   Non-small cell carcinoma of left lung, stage 3 (HCC)   Pleural effusion on left   Hyponatremia   Persistent atrial fibrillation (HCC)   Pulmonary embolism (HCC)   Protein-calorie malnutrition, severe (HCC)   Pleural effusion          Assessment:  88 YM with non-small cell lung cancer of LUL Ds in 2022 SP chemotherapy admitted for weakness/anemia. Blood Cx+ MRSA. CT showed aortic perforation along with hepatic metastasis and moderate left and right pleural effusion. Taken to OR for EVAR with vascular surgery.  Recent hospitalization: Pt had recently been diagnosed with afib and PE been taken to OR for volvulus.  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 Denies any prior Hx of of infectious process, denies hardware. Etiology of bacteremia is unclear, could be hospital acquired as he had a recent intervention for volvulus(lap procedure in October 2022 at [facility]).  -TTE showed mild thickening of the aortic valve, consider TEE -PE with right ileofemoral DVT on anticoagulation Recommendations: -Continue cefazolin -Follow repeat blood Cx to ensure clearance -Obtain TEE, planned for today -Ordered MRI right knee as it has been tender since admission, Xray was unrevealing.   #Deep pelvic abscess 2.6x4.0 cm(decreasing in size) -Per CT read pt has resolving abscess in the pelvis.  -Per wife at bedside one abscess was drained  a few weeks ago at [facility] with negative Cx. Pt has not been on antibiotics.   Microbiology:   Antibiotics: Azithromycin 12/2 Ceftriaxone 12/2 Cefazolin 12/3-p  Cultures: Blood 12/2 2/2 MSSA 12/3 NGTD Urine 12/2  NGTD    SUBJECTIVE: Pt is resting in bed. We discussed getting an MRI of left knee. He reports that he has had " a lot" of imaging. We discussed that it was ultimately his decision to the MRI of the knee.   Interval: Afebrile overnight. Wbc 5.5K.   Review of Systems: Review of Systems  All other systems reviewed and are negative.   Scheduled Meds:  sodium chloride   Intravenous Once   Chlorhexidine Gluconate Cloth  6 each Topical Daily   Chlorhexidine Gluconate Cloth  6 each Topical Q0600   diltiazem  120 mg Oral Daily   docusate sodium  100 mg Oral Daily   enoxaparin (LOVENOX) injection  70 mg Subcutaneous Q12H   feeding supplement  237 mL Oral BID BM   folic acid  1 mg Oral Daily   metoprolol succinate  25 mg Oral Daily   multivitamin with minerals  1 tablet Oral Daily   mupirocin ointment  1 application Nasal BID   pantoprazole  40 mg Oral Daily   rosuvastatin  5 mg Oral Daily   sodium chloride  1 spray Each Nare Once   Continuous Infusions:  sodium chloride      ceFAZolin (ANCEF) IV 2 g (09/14/21 0608)   PRN Meds:.acetaminophen **OR** acetaminophen, alum & mag hydroxide-simeth, bisacodyl, cyclobenzaprine, guaiFENesin-dextromethorphan, metoprolol tartrate, morphine injection, ondansetron **OR** ondansetron (ZOFRAN) IV, oxyCODONE-acetaminophen, phenol, polyethylene glycol, sodium chloride Allergies  Allergen Reactions   Diclofenac Sodium Other (See Comments)    Kidneys were affected    OBJECTIVE: Vitals:   09/13/21 1929 09/13/21 2337 09/14/21 0351  09/14/21 0829  BP: 100/64 (!) 98/57 124/61 110/61  Pulse: 97 88 91 96  Resp: (!) 21 20 18 17   Temp: 97.9 F (36.6 C) 97.8 F (36.6 C) 98.5 F (36.9 C) 97.6 F (36.4 C)  TempSrc: Oral Oral Oral Oral  SpO2: 93% 94% 96% 97%  Weight:      Height:       Body mass index is 23.82 kg/m.  Physical Exam Constitutional:      General: He is not in acute distress.    Appearance: He is normal weight. He is not  toxic-appearing.  HENT:     Head: Normocephalic and atraumatic.     Right Ear: External ear normal.     Left Ear: External ear normal.     Nose: No congestion or rhinorrhea.     Mouth/Throat:     Mouth: Mucous membranes are moist.     Pharynx: Oropharynx is clear.  Eyes:     Extraocular Movements: Extraocular movements intact.     Conjunctiva/sclera: Conjunctivae normal.     Pupils: Pupils are equal, round, and reactive to light.  Cardiovascular:     Rate and Rhythm: Normal rate and regular rhythm.     Heart sounds: No murmur heard.   No friction rub. No gallop.  Pulmonary:     Effort: Pulmonary effort is normal.     Breath sounds: Normal breath sounds.  Abdominal:     General: Abdomen is flat. Bowel sounds are normal.     Palpations: Abdomen is soft.  Musculoskeletal:        General: No swelling. Normal range of motion.     Cervical back: Normal range of motion and neck supple.  Skin:    General: Skin is warm and dry.  Neurological:     General: No focal deficit present.     Mental Status: He is oriented to person, place, and time.  Psychiatric:        Mood and Affect: Mood normal.      Lab Results Lab Results  Component Value Date   WBC 5.5 09/14/2021   HGB 10.0 (L) 09/14/2021   HCT 31.4 (L) 09/14/2021   MCV 94.9 09/14/2021   PLT 176 09/14/2021    Lab Results  Component Value Date   CREATININE 0.77 09/14/2021   BUN 12 09/14/2021   NA 134 (L) 09/14/2021   K 3.5 09/14/2021   CL 106 09/14/2021   CO2 20 (L) 09/14/2021    Lab Results  Component Value Date   ALT 13 09/09/2021   AST 14 (L) 09/09/2021   ALKPHOS 83 09/09/2021   BILITOT 0.3 09/09/2021        Laurice Record, [facility]ious [facility] 09/14/2021, 10:41 AM

## 2021-09-14 NOTE — Op Note (Signed)
INDICATIONS: Bacteremia  PROCEDURE:   Informed consent was obtained prior to the procedure. The risks, benefits and alternatives for the procedure were discussed and the patient comprehended these risks.  Risks include, but are not limited to, cough, sore throat, vomiting, nausea, somnolence, esophageal and stomach trauma or perforation, bleeding, low blood pressure, aspiration, pneumonia, infection, trauma to the teeth and death.    After a procedural time-out, the oropharynx was anesthetized with 20% benzocaine spray.   During this procedure the patient was administered IV propofol by Anesthesiology, Dr. Nyoka Cowden.  The transesophageal probe was inserted in the esophagus and stomach without difficulty and multiple views were obtained.  The patient was kept under observation until the patient left the procedure room.  The patient left the procedure room in stable condition.   Agitated microbubble saline contrast was not administered.  COMPLICATIONS:    There were no immediate complications.  FINDINGS:  No vegetations are seen. Normal chamber sizes and function. AFib, nor thrombus seen in the left atrial appendage. 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 There is trace MR and mild TR. Moderate to severe aortic atherosclerosis in the arch and descending aorta.  There is a large left pleural effusion with atelectatic lung.  RECOMMENDATIONS:    Evaluate for other source of bacteremia. Consider left thoracentesis.  Time Spent Directly with the Patient:  30 minutes   Leon Lyons 09/14/2021, 2:11 PM

## 2021-09-14 NOTE — H&P (View-Only) (Signed)
Leon Lyons for Infectious Disease  Date of Admission:  09/09/2021   Total days of inpatient antibiotics 5  Principal Problem:   Symptomatic anemia Active Problems:   Non-small cell carcinoma of left lung, stage 3 (HCC)   Pleural effusion on left   Hyponatremia   Persistent atrial fibrillation (HCC)   Pulmonary embolism (HCC)   Protein-calorie malnutrition, severe (HCC)   Pleural effusion          Assessment:  88 YM with non-small cell lung cancer of LUL Ds in 2022 SP chemotherapy admitted for weakness/anemia. Blood Cx+ MRSA. CT showed aortic perforation along with hepatic metastasis and moderate left and right pleural effusion. Taken to OR for EVAR with vascular surgery.  Recent hospitalization: Pt had recently been diagnosed with afib and PE been taken to OR for volvulus.  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 Denies any prior Hx of of infectious process, denies hardware. Etiology of bacteremia is unclear, could be hospital acquired as he had a recent intervention for volvulus(lap procedure in October 2022 at [facility]).  -TTE showed mild thickening of the aortic valve, consider TEE -PE with right ileofemoral DVT on anticoagulation Recommendations: -Continue cefazolin -Follow repeat blood Cx to ensure clearance -Obtain TEE, planned for today -Ordered MRI right knee as it has been tender since admission, Xray was unrevealing.   #Deep pelvic abscess 2.6x4.0 cm(decreasing in size) -Per CT read pt has resolving abscess in the pelvis.  -Per wife at bedside one abscess was drained  a few weeks ago at [facility] with negative Cx. Pt has not been on antibiotics.   Microbiology:   Antibiotics: Azithromycin 12/2 Ceftriaxone 12/2 Cefazolin 12/3-p  Cultures: Blood 12/2 2/2 MSSA 12/3 NGTD Urine 12/2  NGTD    SUBJECTIVE: Pt is resting in bed. We discussed getting an MRI of left knee. He reports that he has had " a lot" of imaging. We discussed that it was ultimately his decision to the MRI of the knee.   Interval: Afebrile overnight. Wbc 5.5K.   Review of Systems: Review of Systems  All other systems reviewed and are negative.   Scheduled Meds:  sodium chloride   Intravenous Once   Chlorhexidine Gluconate Cloth  6 each Topical Daily   Chlorhexidine Gluconate Cloth  6 each Topical Q0600   diltiazem  120 mg Oral Daily   docusate sodium  100 mg Oral Daily   enoxaparin (LOVENOX) injection  70 mg Subcutaneous Q12H   feeding supplement  237 mL Oral BID BM   folic acid  1 mg Oral Daily   metoprolol succinate  25 mg Oral Daily   multivitamin with minerals  1 tablet Oral Daily   mupirocin ointment  1 application Nasal BID   pantoprazole  40 mg Oral Daily   rosuvastatin  5 mg Oral Daily   sodium chloride  1 spray Each Nare Once   Continuous Infusions:  sodium chloride      ceFAZolin (ANCEF) IV 2 g (09/14/21 0608)   PRN Meds:.acetaminophen **OR** acetaminophen, alum & mag hydroxide-simeth, bisacodyl, cyclobenzaprine, guaiFENesin-dextromethorphan, metoprolol tartrate, morphine injection, ondansetron **OR** ondansetron (ZOFRAN) IV, oxyCODONE-acetaminophen, phenol, polyethylene glycol, sodium chloride Allergies  Allergen Reactions   Diclofenac Sodium Other (See Comments)    Kidneys were affected    OBJECTIVE: Vitals:   09/13/21 1929 09/13/21 2337 09/14/21 0351  09/14/21 0829  BP: 100/64 (!) 98/57 124/61 110/61  Pulse: 97 88 91 96  Resp: (!) 21 20 18 17   Temp: 97.9 F (36.6 C) 97.8 F (36.6 C) 98.5 F (36.9 C) 97.6 F (36.4 C)  TempSrc: Oral Oral Oral Oral  SpO2: 93% 94% 96% 97%  Weight:      Height:       Body mass index is 23.82 kg/m.  Physical Exam Constitutional:      General: He is not in acute distress.    Appearance: He is normal weight. He is not  toxic-appearing.  HENT:     Head: Normocephalic and atraumatic.     Right Ear: External ear normal.     Left Ear: External ear normal.     Nose: No congestion or rhinorrhea.     Mouth/Throat:     Mouth: Mucous membranes are moist.     Pharynx: Oropharynx is clear.  Eyes:     Extraocular Movements: Extraocular movements intact.     Conjunctiva/sclera: Conjunctivae normal.     Pupils: Pupils are equal, round, and reactive to light.  Cardiovascular:     Rate and Rhythm: Normal rate and regular rhythm.     Heart sounds: No murmur heard.   No friction rub. No gallop.  Pulmonary:     Effort: Pulmonary effort is normal.     Breath sounds: Normal breath sounds.  Abdominal:     General: Abdomen is flat. Bowel sounds are normal.     Palpations: Abdomen is soft.  Musculoskeletal:        General: No swelling. Normal range of motion.     Cervical back: Normal range of motion and neck supple.  Skin:    General: Skin is warm and dry.  Neurological:     General: No focal deficit present.     Mental Status: He is oriented to person, place, and time.  Psychiatric:        Mood and Affect: Mood normal.      Lab Results Lab Results  Component Value Date   WBC 5.5 09/14/2021   HGB 10.0 (L) 09/14/2021   HCT 31.4 (L) 09/14/2021   MCV 94.9 09/14/2021   PLT 176 09/14/2021    Lab Results  Component Value Date   CREATININE 0.77 09/14/2021   BUN 12 09/14/2021   NA 134 (L) 09/14/2021   K 3.5 09/14/2021   CL 106 09/14/2021   CO2 20 (L) 09/14/2021    Lab Results  Component Value Date   ALT 13 09/09/2021   AST 14 (L) 09/09/2021   ALKPHOS 83 09/09/2021   BILITOT 0.3 09/09/2021        Laurice Record, [place] for Infectious Disease [facility] 09/14/2021, 10:41 AM

## 2021-09-14 NOTE — Interval H&P Note (Signed)
History and Physical Interval Note:  09/14/2021 6:88 PM  Leon Lyons  has presented today for surgery, with the diagnosis of BACTEREMIA.  The various methods of treatment have been discussed with the patient and family. After consideration of risks, benefits and other options for treatment, the patient has consented to  Procedure(s): TRANSESOPHAGEAL ECHOCARDIOGRAM (TEE) (N/A) as a surgical intervention.  The patient's history has been reviewed, patient examined, no change in status, stable for surgery.  I have reviewed the patient's chart and labs.  Questions were answered to the patient's satisfaction.     Leon Lyons

## 2021-09-14 NOTE — Transfer of Care (Signed)
Immediate Anesthesia Transfer of Care Note  Patient: Leon Lyons  Procedure(s) Performed: TRANSESOPHAGEAL ECHOCARDIOGRAM (TEE)  Patient Location: Endoscopy Unit  Anesthesia Type:MAC  Level of Consciousness: drowsy and patient cooperative  Airway & Oxygen Therapy: Patient Spontanous Breathing and Patient connected to nasal cannula oxygen  Post-op Assessment: Report given to RN and Post -op Vital signs reviewed and stable  Post vital signs: Reviewed and stable  Last Vitals:  Vitals Value Taken Time  BP    Temp    Pulse    Resp    SpO2      Last Pain:  Vitals:   09/14/21 1330  TempSrc: Other (Comment)  PainSc:       Patients Stated Pain Goal: 0 (93/81/01 7510)  Complications: No notable events documented.

## 2021-09-14 NOTE — Progress Notes (Signed)
ANTICOAGULATION CONSULT NOTE   Pharmacy Consult for Enoxaparin Indication: atrial fibrillation and pulmonary embolus  Allergies  Allergen Reactions   Diclofenac Sodium Other (See Comments)    Kidneys were affected    Patient Measurements: Height: 5\' 10"  (177.8 cm) Weight: 75.3 kg (166 lb 0.1 oz) IBW/kg (Calculated) : 73  Vital Signs: Temp: 97.6 F (36.4 C) (12/07 0829) Temp Source: Oral (12/07 0829) BP: 110/61 (12/07 0829) Pulse Rate: 96 (12/07 0829)  Labs: Recent Labs    09/12/21 0136 09/14/21 0200  HGB 10.6* 10.0*  HCT 33.5* 31.4*  PLT 140* 176  CREATININE 0.78 0.77     Estimated Creatinine Clearance: 68.4 mL/min (by C-G formula based on SCr of 0.77 mg/dL).   Medical History: Past Medical History:  Diagnosis Date   History of radiation therapy 03/24/2021   left lung   02/11/2021-03/24/2021  Dr Gery Pray   Lung cancer [facility])    Tobacco use     Assessment: 87 yo male admitted on 09/09/2021 and transferred to [facility] for contained aortic leak s/p repair on 09/11/2021. Patient is on enoxaparin 70 mg SQ BID prior to admission for afib and history of PE. Pharmacy consulted to resume enoxaparin on 09/12/2021. CrCl stable.   Goal of Therapy:  Anti-Xa level 0.6-1 units/ml 4hrs after LMWH dose given Monitor platelets by anticoagulation protocol: Yes   Plan:  Continue enoxaparin 70 mg twice daily on 09/12/2021 Obtain anti-Xa levels as appropriate  Monitor renal function, CBC and s/s of bleeding   [place] Crane, Roylene Reason, [employer] Clinical Pharmacist  09/14/2021 10:50 AM   [facility] pharmacy phone numbers are listed on [email or web address]

## 2021-09-14 NOTE — Progress Notes (Signed)
Inpatient Rehab Admissions Coordinator:   Met with patient and his spouse at bedside to discuss CIR goals/expectations.  Pt sleeping throughout my visit and did not wake at this time.  Discussed with wife (April) about recommendations for CIR and let her know that it would be 3 hrs/day in the hospital with close physician follow up.  We discussed average length of stay to be about 2 weeks, dependent upon progress, and that our goal is for discharge home with family support.  April is home with him during the day and can provide support.  Reviewed Medicare coverage for CIR.  Note medical work up ongoing so will follow for potential admission pending bed availability and medical readiness.    Shann Medal, PT, DPT Admissions Coordinator [phone] 09/14/21  12:32 PM

## 2021-09-15 ENCOUNTER — Other Ambulatory Visit: Payer: Self-pay | Admitting: *Deleted

## 2021-09-15 ENCOUNTER — Inpatient Hospital Stay (HOSPITAL_COMMUNITY): Payer: Medicare Other

## 2021-09-15 DIAGNOSIS — Z515 Encounter for palliative care: Secondary | ICD-10-CM

## 2021-09-15 DIAGNOSIS — Z9889 Other specified postprocedural states: Secondary | ICD-10-CM

## 2021-09-15 DIAGNOSIS — I4891 Unspecified atrial fibrillation: Secondary | ICD-10-CM

## 2021-09-15 DIAGNOSIS — R140 Abdominal distension (gaseous): Secondary | ICD-10-CM

## 2021-09-15 DIAGNOSIS — Z7189 Other specified counseling: Secondary | ICD-10-CM

## 2021-09-15 DIAGNOSIS — I714 Abdominal aortic aneurysm, without rupture, unspecified: Secondary | ICD-10-CM

## 2021-09-15 DIAGNOSIS — R7881 Bacteremia: Secondary | ICD-10-CM

## 2021-09-15 DIAGNOSIS — K922 Gastrointestinal hemorrhage, unspecified: Secondary | ICD-10-CM

## 2021-09-15 DIAGNOSIS — R52 Pain, unspecified: Secondary | ICD-10-CM

## 2021-09-15 HISTORY — PX: IR THORACENTESIS ASP PLEURAL SPACE W/IMG GUIDE: IMG5380

## 2021-09-15 LAB — BODY FLUID CELL COUNT WITH DIFFERENTIAL
Eos, Fluid: 0 %
Lymphs, Fluid: 57 %
Monocyte-Macrophage-Serous Fluid: 20 % — ABNORMAL LOW (ref 50–90)
Neutrophil Count, Fluid: 23 % (ref 0–25)
Total Nucleated Cell Count, Fluid: 75 cu mm (ref 0–1000)

## 2021-09-15 LAB — PROTEIN, PLEURAL OR PERITONEAL FLUID: Total protein, fluid: <3 g/dL

## 2021-09-15 MED ORDER — ORAL CARE MOUTH RINSE
15.0000 mL | Freq: Two times a day (BID) | OROMUCOSAL | Status: DC
  Administered 2021-09-15 – 2021-09-17 (×4): 15 mL via OROMUCOSAL

## 2021-09-15 MED ORDER — LIDOCAINE HCL 1 % IJ SOLN
INTRAMUSCULAR | Status: DC | PRN
  Administered 2021-09-15: 20 mL

## 2021-09-15 MED ORDER — LIDOCAINE HCL 1 % IJ SOLN
INTRAMUSCULAR | Status: AC
  Filled 2021-09-15: qty 20

## 2021-09-15 MED ORDER — GADOBUTROL 1 MMOL/ML IV SOLN
7.0000 mL | Freq: Once | INTRAVENOUS | Status: AC | PRN
  Administered 2021-09-15: 7 mL via INTRAVENOUS

## 2021-09-15 NOTE — Progress Notes (Signed)
Mobility Specialist: Progress Note   09/15/21 1315  Mobility  Activity Stood at bedside  Level of Assistance Maximum assist, patient does 25-49%  Assistive Device Front wheel walker  Mobility Sit up in bed/chair position for meals  Mobility Response Tolerated poorly  Mobility performed by Mobility specialist  $Mobility charge 1 Mobility   Pre-Mobility on 2 L/min [place] : 95 HR, 96% SpO2 Post-Mobility on RA: 93 HR, 92% SpO2  Pt required heavy modA to sit EOB and max A to stand at RW. Pt unable to tolerate pivot transfer to chair d/t c/o back and LE pain, no rating given. Pt assisted with putting his legs back in the bed. Call bell and phone is at his side. Family present in the room.   [facility] Manraj Yeo Mobility Specialist Mobility Specialist Phone #1: [phone] Mobility Specialist Phone #2: [phone]

## 2021-09-15 NOTE — Consult Note (Signed)
Palliative Care Consult Note                                  Date: 09/15/2021   Patient Name: Leon Lyons  DOB: [birth year]  MRN: [id number]  Age / Sex: 87 y.o., male  PCP: [name], MD Referring Physician: Barb Merino, MD  Reason for Consultation: Establishing goals of care  HPI/Patient Profile: 87 y.o. male  with past medical history of 87 year old gentleman with history of metastatic lung cancer stage IIIb, recent diagnosis of A. fib and pulmonary embolism after an abdominal surgery for volvulus presented with generalized weakness and shortness of breath on exertion.  In the emergency room, chest x-ray with left upper lobe infiltrate and left pleural effusion.  CT scan of the chest with left perihilar postradiation changes and fibrosis groundglass opacities, right fifth rib metastatic lesion, right lower lobe pulmonary embolus with pulmonary infarction.  CT scan of the abdomen with right iliofemoral DVT.  Contained aortic leak at the aortic bifurcation with periaortic hematoma and hemoglobin of 7.4. Remains in the hospital with MSSA bacteremia, vascular intervention. Oncology plans to follow-up as outpatient; not a current candidate for chemo given infection and need for rehab.  Patient was noted to be very overwhelmed with everything going on. Agreed to palliative consult for GOC and support.  Past Medical History:  Diagnosis Date   History of radiation therapy 03/24/2021   left lung   02/11/2021-03/24/2021  Dr Gery Pray   Lung cancer [facility])    Tobacco use     Subjective:   This NP Walden Field reviewed medical records, received report from team, assessed the patient and then meet at the patient's bedside to discuss diagnosis, prognosis, GOC, EOL wishes disposition and options.  I met with the patient's wife at the bedside. The patient is just back from TEE and sleeping from deep sedation (propofol).   Concept of Palliative  Care was introduced as specialized medical care for people and their families living with serious illness.  If focuses on providing relief from the symptoms and stress of a serious illness.  The goal is to improve quality of life for both the patient and the family. Values and goals of care important to patient and family were attempted to be elicited.  Created space and opportunity for patient  and family to explore thoughts and feelings regarding current medical situation   Natural trajectory and current clinical status were discussed. Questions and concerns addressed. Patient  encouraged to call with questions or concerns.    Patient/Family Understanding of Illness: His wife understands he has had a pretty bad year.  She states she has never been sick before but a lot is happened over the past 12 months.  For his newly diagnosed stage III lung cancer he has undergone chemotherapy, radiation, and immunotherapy.  On 07/30/2021 he went to the emergency room and ended up requiring emergent colon resection for a volvulus.  She recognizes that he is struggling.  Life Review: Deferred  Patient Values: Deferred  Goals: Deferred  Today's Discussion: I spoke with his wife today.  The patient is soundly and peacefully sleeping.  She describes that they have recently moved here from [place] since his retirement in order to be closer to his family.  They have only been here for about a year and a half or 2 years.  They have been married for 30 years.  She  states that he is always taking care of her, she jokes that he is "her driver" and does all the driving.  Recently, since his hospitalizations, she has had to learn how to get around the area because he is not available to help drive her.  She seems to be recognizing the loss of the established relationship as the patient is a caregiver and her as to cared for.  She is tearful in describing his recent illnesses.  She states that he has not never really  been sick until this past year but has been struggled.  She seems to understand that he is having a hard time and is declining.  She describes that he is "long-term tired" as well as overwhelmed.  She appreciates our assistance in helping to decide where to go from here, what to do, and what not to do.  Overall we feel the conversation would be more beneficial tomorrow after he has had his MRI, is awake from TEE deep sedation and the patient is able to participate.  Review of Systems  Unable to perform ROS: Other (patient is sleeping from sedation)  Objective:   Primary Diagnoses: Present on Admission:  Non-small cell carcinoma of left lung, stage 3 (HCC)  Pleural effusion on left  Symptomatic anemia  Hyponatremia  Persistent atrial fibrillation (HCC)  Pulmonary embolism (HCC)  Protein-calorie malnutrition, severe (HCC)  Pleural effusion   Physical Exam Vitals and nursing note reviewed.  Constitutional:      General: He is sleeping. He is not in acute distress.    Appearance: He is ill-appearing.  HENT:     Head: Normocephalic and atraumatic.  Cardiovascular:     Rate and Rhythm: Normal rate.  Pulmonary:     Effort: Pulmonary effort is normal. No respiratory distress.  Abdominal:     General: Abdomen is flat.     Palpations: Abdomen is soft.    Vital Signs:  BP 122/66 Comment: _0   Pulse (!) 114   Temp 98.3 F (36.8 C) (Oral)   Resp 18   Ht 5' 10" (1.778 m)   Wt 75.3 kg   SpO2 92%   BMI 23.82 kg/m   Palliative Assessment/Data: TBD    Advanced Care Planning:   Primary Decision Maker: OTHER: TBD tomorrow  Code Status/Advance Care Planning: Full code  A discussion was had today regarding advanced directives. Concepts specific to code status, artifical feeding and hydration, continued IV antibiotics and rehospitalization was had.  The difference between a aggressive medical intervention path and a palliative comfort care path for this patient at this time was  had.   Decisions/Changes to ACP: None today  Assessment & Plan:   Impression: 87 year old male with significant decline over the past year.  Recently diagnosed with stage III lung cancer now with apparent mets to fifth rib, status post thoracentesis.  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  He has A. fib.  Now with pulmonary embolism with right lower lobe pulmonary infarct, aortic bifurcation leak and hematoma formation, DVT, MSSA undergoing work-up by infectious disease.  Overall recognition of downward trajectory, hospitalist feels overall poor prognosis with lung cancer and DVT/PE.  We will follow-up for goals of care conversations when the patient is awake  SUMMARY OF RECOMMENDATIONS   Continue current care for now Follow-up goals of care conversations tomorrow PMT will continue to follow  Symptom Management:  Per primary team PMT is available to assist as needed  Prognosis:  Unable to  determine  Discharge Planning:  To Be Determined   Discussed with: [name], medical team, nursing team    Thank you for allowing [place] to participate in the care of Leon Lyons PMT will continue to support holistically.  Time Total: 70 min  Greater than 50%  of this time was spent counseling and coordinating care related to the above assessment and plan.  Signed by: Walden Field, NP Palliative Medicine Team  Team Phone # [phone] (Nights/Weekends)  09/15/2021, 10:59 AM

## 2021-09-15 NOTE — Progress Notes (Signed)
PROGRESS NOTE    Leon Lyons  QPR:[id number] DOB: 1933-03-27 DOA: 09/09/2021 PCP: [name], MD    Brief Narrative:  87 year old gentleman with history of metastatic lung cancer stage IIIb, recent diagnosis of A. fib and pulmonary embolism after an abdominal surgery for volvulus presented with generalized weakness and shortness of breath on exertion.  In the emergency room, chest x-ray with left upper lobe infiltrate and left pleural effusion.  CT scan of the chest with left perihilar postradiation changes and fibrosis groundglass opacities, right fifth rib metastatic lesion, right lower lobe pulmonary embolus with pulmonary infarction.  CT scan of the abdomen with right iliofemoral DVT.  Contained aortic leak at the aortic bifurcation with periaortic hematoma.  Hemoglobin 7.4. Remains in the hospital with MSSA bacteremia, vascular intervention.   Assessment & Plan:   Principal Problem:   Symptomatic anemia Active Problems:   Non-small cell carcinoma of left lung, stage 3 (HCC)   Pleural effusion on left   Hyponatremia   Persistent atrial fibrillation (HCC)   Pulmonary embolism (HCC)   Protein-calorie malnutrition, severe (HCC)   Pleural effusion   Bacteremia  Acute symptomatic anemia due to surgical colonic anastomosis bleeding: Acute on chronic anemia.  Anemia of chronic disease including metastatic lung cancer. -Received total 3 units of PRBC transfusion.  Hemoglobin appropriately responded and remains a stable. GI recommended conservative management.  No evidence of active bleeding now.  Aortic leak now status post endovascular repair: Vascular following.  Currently tolerating anticoagulation with Lovenox.  Vascular surgery plans to recheck CT scan in 4 weeks.  A. fib with RVR: Currently rate controlled on diltiazem and metoprolol.  Therapeutic on Lovenox.  Will discharge on Lovenox due to thromboembolism associated with metastatic cancer.  MSSA bacteremia: Initial  blood cultures 12/2 positive for MSSA.  Repeat blood cultures negative.  TTE without evidence of vegetation. TEE with no evidence of vegetation.  Followed by ID.  Remains on cefazolin antibiotic therapy. Left knee MRI today as per ID with tenderness along the suprapatellar area. Left thoracentesis today.  We will send fluid for lab and cytology.  Stage III non-small cell lung cancer/pulmonary embolism/right iliofemoral DVT: Proximal DVT and pulmonary embolism in a patient with metastatic lung cancer usually associated with poor prognosis. Seen by oncology in the hospital, they plan to follow-up outpatient once his infection is improved. Will keep patient on Lovenox, currently tolerating. Patient is overwhelmed with diagnosis and multiple issues, he will benefit with palliative discussion.  Will consult palliative care.  Patient and wife agreed. Adequate pain medications along with laxatives. Very frail and debilitated.  Hypokalemia: Replaced and adequate.   DVT prophylaxis: SCD's Start: 09/11/21 1206 SCDs Start: 09/09/21 1609   Code Status: Full code Family Communication: Wife at the bedside Disposition Plan: Status is: Inpatient  Remains inpatient appropriate because: Inpatient procedure planned.  Patient on IV antibiotics.         Consultants:  Hematology oncology Vascular Palliative  Procedures:  Multiple procedures as above.  Antimicrobials:  Cefazolin 12/2---   Subjective:  Patient seen and examined.  Today he complained of some pain in his pelvic area.  Wife at the bedside.  Afebrile overnight. Patient tells me that he has not been out of bed for last 1 week. He is very worried but he tells me that he is looking forward to get over all these and get better. Denies any nausea vomiting or chest pain.  No shortness of breath at rest but he has not mobilized.  He was agreeable for left-sided thoracentesis and MRI today.  Objective: Vitals:   09/15/21 0405  09/15/21 0819 09/15/21 0900 09/15/21 0958  BP: (!) 129/96 120/61 (!) 155/75 122/66  Pulse: 100 (!) 114    Resp: 20 18    Temp: 97.6 F (36.4 C) 98.3 F (36.8 C)    TempSrc: Oral Oral    SpO2: 93% 92%    Weight:      Height:        Intake/Output Summary (Last 24 hours) at 09/15/2021 1102 Last data filed at 09/15/2021 0830 Gross per 24 hour  Intake 640 ml  Output 1375 ml  Net -735 ml   Filed Weights   09/10/21 0954 09/11/21 0002 09/14/21 1330  Weight: 74.8 kg 75.3 kg 75.3 kg    Examination:  General exam: Appears comfortable at rest.  He is anxious and frail and debilitated.  Chronically sick looking. Respiratory system: On room air.  Almost no air entry on the left side with some conducted bronchial sounds. Cardiovascular system: S1 & S2 heard, irregularly irregular.   Gastrointestinal system: Abdomen is nondistended, soft and nontender. No organomegaly or masses felt. Normal bowel sounds heard. Central nervous system: Alert and oriented. No focal neurological deficits.  Generalized weakness. Extremities: Symmetric 5 x 5 power. Patient has a small tender spot on left prepatellar area.  No evidence of joint effusion.    Data Reviewed: I have personally reviewed following labs and imaging studies  CBC: Recent Labs  Lab 09/09/21 1248 09/10/21 0833 09/11/21 0830 09/11/21 0957 09/12/21 0136 09/14/21 0200  WBC 10.5 9.1  --  8.1 10.9* 5.5  NEUTROABS 9.0*  --   --   --   --   --   HGB 7.4* 8.0* 9.5* 9.7* 10.6* 10.0*  HCT 24.0* 25.0* 28.0* 30.3* 33.5* 31.4*  MCV 98.0 96.2  --  93.2 92.8 94.9  PLT 244 222  --  160 140* 009   Basic Metabolic Panel: Recent Labs  Lab 09/09/21 1248 09/10/21 0510 09/11/21 0830 09/11/21 0957 09/12/21 0136 09/14/21 0200  NA 129* 131* 138 135 134* 134*  K 3.7 4.1 3.3* 3.3* 4.4 3.5  CL 98 105  --  107 105 106  CO2 25 23  --  24 22 20*  GLUCOSE 130* 87  --  106* 117* 93  BUN 21 17  --  10 11 12   CREATININE 0.85 0.76  --  0.66 0.78 0.77   CALCIUM 8.3* 7.8*  --  8.1* 8.3* 7.9*  MG  --   --   --  1.9  --   --   PHOS  --  3.0  --   --   --   --    GFR: Estimated Creatinine Clearance: 68.4 mL/min (by C-G formula based on SCr of 0.77 mg/dL). Liver Function Tests: Recent Labs  Lab 09/09/21 1248 09/10/21 0510  AST 14*  --   ALT 13  --   ALKPHOS 83  --   BILITOT 0.3  --   PROT 6.6  --   ALBUMIN 1.8* 1.6*   No results for input(s): LIPASE, AMYLASE in the last 168 hours. No results for input(s): AMMONIA in the last 168 hours. Coagulation Profile: Recent Labs  Lab 09/09/21 1248 09/11/21 0957  INR 1.2 1.4*   Cardiac Enzymes: No results for input(s): CKTOTAL, CKMB, CKMBINDEX, TROPONINI in the last 168 hours. BNP (last 3 results) No results for input(s): PROBNP in the last 8760 hours. HbA1C: No results  for input(s): HGBA1C in the last 72 hours. CBG: No results for input(s): GLUCAP in the last 168 hours. Lipid Profile: No results for input(s): CHOL, HDL, LDLCALC, TRIG, CHOLHDL, LDLDIRECT in the last 72 hours. Thyroid Function Tests: No results for input(s): TSH, T4TOTAL, FREET4, T3FREE, THYROIDAB in the last 72 hours. Anemia Panel: No results for input(s): VITAMINB12, FOLATE, FERRITIN, TIBC, IRON, RETICCTPCT in the last 72 hours. Sepsis Labs: Recent Labs  Lab 09/09/21 1248 09/10/21 0510 09/11/21 0335  PROCALCITON  --  0.62 0.47  LATICACIDVEN 1.8  --   --     Recent Results (from the past 240 hour(s))  Blood Culture (routine x 2)     Status: Abnormal   Collection Time: 09/09/21 12:48 PM   Specimen: BLOOD  Result Value Ref Range Status   Specimen Description   Final    BLOOD LEFT ANTECUBITAL Performed at [facility] [address], [place], [place] [address]    Special Requests   Final    BOTTLES DRAWN AEROBIC AND ANAEROBIC Blood Culture results may not be optimal due to an excessive volume of blood received in culture bottles Performed at [facility]  [address], [place], [place] [address]    Culture  Setup Time   Final    GRAM POSITIVE COCCI IN BOTH AEROBIC AND ANAEROBIC BOTTLES CRITICAL RESULT CALLED TO, READ BACK BY AND VERIFIED WITH: PHARMD ELLEN JACKSON 09/10/21@5 :10 BY TW Performed at [facility], [place] [address] [place], [place] [address]    Culture STAPHYLOCOCCUS AUREUS (A)  Final   Report Status 09/12/2021 FINAL  Final   Organism ID, Bacteria STAPHYLOCOCCUS AUREUS  Final      Susceptibility   Staphylococcus aureus - MIC*    CIPROFLOXACIN <=0.5 SENSITIVE Sensitive     ERYTHROMYCIN <=0.25 SENSITIVE Sensitive     GENTAMICIN <=0.5 SENSITIVE Sensitive     OXACILLIN 0.5 SENSITIVE Sensitive     TETRACYCLINE <=1 SENSITIVE Sensitive     VANCOMYCIN <=0.5 SENSITIVE Sensitive     TRIMETH/SULFA <=10 SENSITIVE Sensitive     CLINDAMYCIN <=0.25 SENSITIVE Sensitive     RIFAMPIN <=0.5 SENSITIVE Sensitive     Inducible Clindamycin NEGATIVE Sensitive     * STAPHYLOCOCCUS AUREUS  Resp Panel by RT-PCR (Flu A&B, Covid) Nasopharyngeal Swab     Status: None   Collection Time: 09/09/21 12:48 PM   Specimen: Nasopharyngeal Swab; Nasopharyngeal(NP) swabs in vial transport medium  Result Value Ref Range Status   SARS Coronavirus 2 by RT PCR NEGATIVE NEGATIVE Final    Comment: (NOTE) SARS-CoV-2 target nucleic acids are NOT DETECTED.  The SARS-CoV-2 RNA is generally detectable in upper respiratory specimens during the acute phase of infection. The lowest concentration of SARS-CoV-2 viral copies this assay can detect is 138 copies/mL. A negative result does not preclude SARS-Cov-2 infection and should not be used as the sole basis for treatment or other patient management decisions. A negative result may occur with  improper specimen collection/handling, submission of specimen other than nasopharyngeal swab, presence of viral mutation(s) within the areas targeted by this assay, and inadequate number of viral copies(<138 copies/mL). A negative  result must be combined with clinical observations, patient history, and epidemiological information. The expected result is Negative.  Fact Sheet for Patients:  EntrepreneurPulse.com.au  Fact Sheet for Healthcare Providers:  IncredibleEmployment.be  This test is no t yet approved or cleared by the [place] FDA and  has been authorized for detection and/or diagnosis of SARS-CoV-2  by FDA under an Emergency Use Authorization (EUA). This EUA will remain  in effect (meaning this test can be used) for the duration of the COVID-19 declaration under Section 564(b)(1) of the Act, 21 U.S.C.section 360bbb-3(b)(1), unless the authorization is terminated  or revoked sooner.       Influenza A by PCR NEGATIVE NEGATIVE Final   Influenza B by PCR NEGATIVE NEGATIVE Final    Comment: (NOTE) The Xpert Xpress SARS-CoV-2/FLU/RSV plus assay is intended as an aid in the diagnosis of influenza from Nasopharyngeal swab specimens and should not be used as a sole basis for treatment. Nasal washings and aspirates are unacceptable for Xpert Xpress SARS-CoV-2/FLU/RSV testing.  Fact Sheet for Patients: EntrepreneurPulse.com.au  Fact Sheet for Healthcare Providers: IncredibleEmployment.be  This test is not yet approved or cleared by the [place] FDA and has been authorized for detection and/or diagnosis of SARS-CoV-2 by FDA under an Emergency Use Authorization (EUA). This EUA will remain in effect (meaning this test can be used) for the duration of the COVID-19 declaration under Section 564(b)(1) of the Act, 21 U.S.C. section 360bbb-3(b)(1), unless the authorization is terminated or revoked.  Performed at [facility], [place] [address], [place], [place] [address]   Blood Culture ID Panel (Reflexed)     Status: Abnormal   Collection Time: 09/09/21 12:48 PM  Result Value Ref Range Status   Enterococcus  faecalis NOT DETECTED NOT DETECTED Final   Enterococcus Faecium NOT DETECTED NOT DETECTED Final   Listeria monocytogenes NOT DETECTED NOT DETECTED Final   Staphylococcus species DETECTED (A) NOT DETECTED Final    Comment: CRITICAL RESULT CALLED TO, READ BACK BY AND VERIFIED WITH: PHARMD ELLEN JACKSON 09/10/21@5 :10 BY TW    Staphylococcus aureus (BCID) DETECTED (A) NOT DETECTED Final    Comment: CRITICAL RESULT CALLED TO, READ BACK BY AND VERIFIED WITH: PHARMD ELLEN JACKSON 09/10/21@5 :10 BY TW    Staphylococcus epidermidis NOT DETECTED NOT DETECTED Final   Staphylococcus lugdunensis NOT DETECTED NOT DETECTED Final   Streptococcus species NOT DETECTED NOT DETECTED Final   Streptococcus agalactiae NOT DETECTED NOT DETECTED Final   Streptococcus pneumoniae NOT DETECTED NOT DETECTED Final   Streptococcus pyogenes NOT DETECTED NOT DETECTED Final   A.calcoaceticus-baumannii NOT DETECTED NOT DETECTED Final   Bacteroides fragilis NOT DETECTED NOT DETECTED Final   Enterobacterales NOT DETECTED NOT DETECTED Final   Enterobacter cloacae complex NOT DETECTED NOT DETECTED Final   Escherichia coli NOT DETECTED NOT DETECTED Final   Klebsiella aerogenes NOT DETECTED NOT DETECTED Final   Klebsiella oxytoca NOT DETECTED NOT DETECTED Final   Klebsiella pneumoniae NOT DETECTED NOT DETECTED Final   Proteus species NOT DETECTED NOT DETECTED Final   Salmonella species NOT DETECTED NOT DETECTED Final   Serratia marcescens NOT DETECTED NOT DETECTED Final   Haemophilus influenzae NOT DETECTED NOT DETECTED Final   Neisseria meningitidis NOT DETECTED NOT DETECTED Final   Pseudomonas aeruginosa NOT DETECTED NOT DETECTED Final   Stenotrophomonas maltophilia NOT DETECTED NOT DETECTED Final   Candida albicans NOT DETECTED NOT DETECTED Final   Candida auris NOT DETECTED NOT DETECTED Final   Candida glabrata NOT DETECTED NOT DETECTED Final   Candida krusei NOT DETECTED NOT DETECTED Final   Candida parapsilosis NOT  DETECTED NOT DETECTED Final   Candida tropicalis NOT DETECTED NOT DETECTED Final   Cryptococcus neoformans/gattii NOT DETECTED NOT DETECTED Final   Meth resistant mecA/C and MREJ NOT DETECTED NOT DETECTED Final    Comment: Performed at [facility], [address] [place], [place] [address]  Blood Culture (routine x 2)     Status: Abnormal   Collection Time: 09/09/21 12:53 PM   Specimen: BLOOD  Result Value Ref Range Status   Specimen Description   Final    BLOOD RIGHT ANTECUBITAL Performed at [facility] [address], [place], [place] [address]    Special Requests   Final    BOTTLES DRAWN AEROBIC AND ANAEROBIC Blood Culture adequate volume Performed at [facility] [address] [place], [place] [address]    Culture  Setup Time   Final    GRAM POSITIVE COCCI IN BOTH AEROBIC AND ANAEROBIC BOTTLES CRITICAL VALUE NOTED.  VALUE IS CONSISTENT WITH PREVIOUSLY REPORTED AND CALLED VALUE.    Culture (A)  Final    STAPHYLOCOCCUS AUREUS SUSCEPTIBILITIES PERFORMED ON PREVIOUS CULTURE WITHIN THE LAST 5 DAYS. Performed at [facility], [place] [address] [place], [place] [address]    Report Status 09/12/2021 FINAL  Final  Urine Culture     Status: None   Collection Time: 09/09/21  6:46 PM   Specimen: In/Out Cath Urine  Result Value Ref Range Status   Specimen Description   Final    IN/OUT CATH URINE Performed at [facility] [address], [place], [place] [address]    Special Requests   Final    NONE Performed at [facility], [place] [address] [place], [place] [address]    Culture   Final    NO GROWTH Performed at [facility], [place] [address], [place], [place] [address]    Report Status 09/11/2021 FINAL  Final  Culture, blood (routine x 2)     Status: None (Preliminary result)   Collection Time: 09/11/21  3:11 AM   Specimen: BLOOD RIGHT WRIST  Result Value Ref Range Status    Specimen Description BLOOD RIGHT WRIST  Final   Special Requests   Final    BOTTLES DRAWN AEROBIC AND ANAEROBIC Blood Culture adequate volume   Culture   Final    NO GROWTH 4 DAYS Performed at [facility], [place] [address], [place], [place] [address]    Report Status PENDING  Incomplete  Culture, blood (routine x 2)     Status: None (Preliminary result)   Collection Time: 09/11/21  3:11 AM   Specimen: BLOOD LEFT HAND  Result Value Ref Range Status   Specimen Description BLOOD LEFT HAND  Final   Special Requests   Final    BOTTLES DRAWN AEROBIC ONLY Blood Culture adequate volume   Culture   Final    NO GROWTH 4 DAYS Performed at [facility], [place] [address], [place], [place] [address]    Report Status PENDING  Incomplete  Surgical pcr screen     Status: Abnormal   Collection Time: 09/11/21  6:27 AM   Specimen: Nasal Mucosa; Nasal Swab  Result Value Ref Range Status   MRSA, PCR NEGATIVE NEGATIVE Final   Staphylococcus aureus POSITIVE (A) NEGATIVE Final    Comment: (NOTE) The Xpert SA Assay (FDA approved for NASAL specimens in patients 59 years of age and older), is one component of a comprehensive surveillance program. It is not intended to diagnose infection nor to guide or monitor treatment. Performed at [facility], [place] [address], [place], [place] [address]          Radiology Studies: DG Chest 1 View  Result Date: 09/15/2021  CLINICAL DATA:  Post left thoracentesis.  History of lung cancer EXAM: CHEST  1 VIEW COMPARISON:  09/11/2021 FINDINGS: Decreasing left pleural effusion following thoracentesis. Continued airspace disease throughout much of the left lung. No pneumothorax. Volume loss on the left. Heart is upper limits normal in size. Mild vascular congestion. IMPRESSION: Decreasing left effusion following thoracentesis.  No pneumothorax. Continued volume loss on the left and diffuse left lung airspace disease. Electronically Signed   By: Rolm Baptise  M.D.   On: 09/15/2021 09:58   ECHO TEE  Result Date: 09/14/2021    TRANSESOPHOGEAL ECHO REPORT   Patient Name:   LENUS TRAUGER Date of Exam: 09/14/2021 Medical Rec #:  [id number]         Height:       70.0 in Accession #:    [id number]        Weight:       166.0 lb Date of Birth:  01/10/1933         BSA:          1.928 m Patient Age:    36 years          BP:           92/49 mmHg Patient Gender: M                 HR:           77 bpm. Exam Location:  Inpatient Procedure: Transesophageal Echo, Cardiac Doppler and Color Doppler Indications:     Bacteremia  History:         Patient has prior history of Echocardiogram examinations, most                  recent 09/11/2021.  Sonographer:     Merrie Roof RDCS Referring Phys:  [id number] Leanor Kail Diagnosing Phys: Sanda Klein MD PROCEDURE: After discussion of the risks and benefits of a TEE, an informed consent was obtained from the patient. The transesophogeal probe was passed without difficulty through the esophogus of the patient. Sedation performed by different physician. The patient was monitored while under deep sedation. The patient's vital signs; including heart rate, blood pressure, and oxygen saturation; remained stable throughout the procedure. The patient developed no complications during the procedure. IMPRESSIONS  1. Left ventricular ejection fraction, by estimation, is 60 to 65%. The left ventricle has normal function. The left ventricle has no regional wall motion abnormalities.  2. Right ventricular systolic function is normal. The right ventricular size is normal.  3. No left atrial/left atrial appendage thrombus was detected. The LAA emptying velocity was 70 cm/s.  4. Large pleural effusion in the left lateral region.  5. The mitral valve is myxomatous. Trivial mitral valve regurgitation. No evidence of mitral stenosis. There is mild late systolic prolapse of both leaflets of the mitral valve.  6. Tricuspid valve regurgitation is mild to  moderate.  7. The aortic valve is tricuspid. There is mild calcification of the aortic valve. There is mild thickening of the aortic valve. Aortic valve regurgitation is mild. Aortic valve sclerosis/calcification is present, without any evidence of aortic stenosis.  8. There is Severe (Grade IV) plaque involving the aortic arch and descending aorta.  9. The inferior vena cava is normal in size with greater than 50% respiratory variability, suggesting right atrial pressure of 3 mmHg. Conclusion(s)/Recommendation(s): No evidence of vegetation/infective endocarditis on this transesophageael echocardiogram. Consider left thoracentesis as part of further evaluation for cause of bacteremia. FINDINGS  Left  Ventricle: Left ventricular ejection fraction, by estimation, is 60 to 65%. The left ventricle has normal function. The left ventricle has no regional wall motion abnormalities. The left ventricular internal cavity size was normal in size. There is  no left ventricular hypertrophy. Right Ventricle: The right ventricular size is normal. No increase in right ventricular wall thickness. Right ventricular systolic function is normal. Left Atrium: Left atrial size was normal in size. No left atrial/left atrial appendage thrombus was detected. The LAA emptying velocity was 70 cm/s. Right Atrium: Right atrial size was normal in size. Pericardium: There is no evidence of pericardial effusion. Mitral Valve: The mitral valve is myxomatous. There is mild late systolic prolapse of both leaflets of the mitral valve. Trivial mitral valve regurgitation. No evidence of mitral valve stenosis. Tricuspid Valve: The tricuspid valve is normal in structure. Tricuspid valve regurgitation is mild to moderate. No evidence of tricuspid stenosis. Aortic Valve: The noncoronary cusp is thickened, calcified and restricted in motion. The aortic valve is tricuspid. There is mild calcification of the aortic valve. There is mild thickening of the aortic  valve. Aortic valve regurgitation is mild. Aortic valve sclerosis/calcification is present, without any evidence of aortic stenosis. Pulmonic Valve: The pulmonic valve was normal in structure. Pulmonic valve regurgitation is not visualized. No evidence of pulmonic stenosis. Aorta: The aortic root is normal in size and structure. There is severe (Grade IV) plaque involving the aortic arch and descending aorta. Venous: The inferior vena cava is normal in size with greater than 50% respiratory variability, suggesting right atrial pressure of 3 mmHg. IAS/Shunts: No atrial level shunt detected by color flow Doppler. Additional Comments: There is a large pleural effusion in the left lateral region. Sanda Klein MD Electronically signed by Sanda Klein MD Signature Date/Time: 09/14/2021/3:37:22 PM    Final    IR THORACENTESIS ASP PLEURAL SPACE W/IMG GUIDE  Result Date: 09/15/2021 INDICATION: Patient with a history of lung cancer and atrial fibrillation presents today with a left pleural effusion. Interventional radiology asked to perform a diagnostic and therapeutic thoracentesis. EXAM: ULTRASOUND GUIDED THORACENTESIS MEDICATIONS: 1% lidocaine 10 mL COMPLICATIONS: None immediate. PROCEDURE: An ultrasound guided thoracentesis was thoroughly discussed with the patient and questions answered. The benefits, risks, alternatives and complications were also discussed. The patient understands and wishes to proceed with the procedure. Written consent was obtained. Ultrasound was performed to localize and mark an adequate pocket of fluid in the left chest. The area was then prepped and draped in the normal sterile fashion. 1% Lidocaine was used for local anesthesia. Under ultrasound guidance a 6 Fr Safe-T-Centesis catheter was introduced. Thoracentesis was performed. The procedure was stopped early due to patient discomfort. The catheter was removed and a dressing applied. FINDINGS: A total of approximately 800 mL of clear  yellow fluid was removed. Samples were sent to the laboratory as requested by the clinical team. IMPRESSION: Successful ultrasound guided left thoracentesis yielding 800 mL of pleural fluid. Read by: Soyla Dryer, NP Electronically Signed   By: Jacqulynn Cadet M.D.   On: 09/15/2021 10:41        Scheduled Meds:  sodium chloride   Intravenous Once   Chlorhexidine Gluconate Cloth  6 each Topical Daily   Chlorhexidine Gluconate Cloth  6 each Topical Q0600   diltiazem  120 mg Oral Daily   docusate sodium  100 mg Oral Daily   enoxaparin (LOVENOX) injection  70 mg Subcutaneous Q12H   feeding supplement  237 mL Oral BID BM   folic  acid  1 mg Oral Daily   lidocaine       mouth rinse  15 mL Mouth Rinse BID   metoprolol succinate  25 mg Oral Daily   multivitamin with minerals  1 tablet Oral Daily   mupirocin ointment  1 application Nasal BID   pantoprazole  40 mg Oral Daily   potassium chloride  40 mEq Oral Once   rosuvastatin  5 mg Oral Daily   sodium chloride  1 spray Each Nare Once   Continuous Infusions:   ceFAZolin (ANCEF) IV Stopped (09/15/21 9675)     LOS: 5 days    Time spent: 30 minutes    Barb Merino, MD [facility] Pager [phone]

## 2021-09-15 NOTE — Procedures (Signed)
PROCEDURE SUMMARY:  Successful US guided left thoracentesis. Yielded 800 ml of clear yellow fluid. Pt tolerated procedure well. No immediate complications.  Specimen  sent for labs. CXR ordered; no post-procedure pneumothorax identified  EBL < 2 mL  Theresa Duty, NP 09/15/2021 10:32 AM

## 2021-09-15 NOTE — Progress Notes (Signed)
PT Cancellation Note  Patient Details Name: Leon Lyons MRN: [id number] DOB: [birth year]   Cancelled Treatment:    Reason Eval/Treat Not Completed: Pain limiting ability to participate; "I'm not doing anymore today, I've tried plenty" despite max encouragement and education. Pt appreciative, but still declines. Will follow-up for PT treatment tomorrow per plan of care.  Mabeline Caras, PT, DPT Acute Rehabilitation Services  Pager [phone] [facility] [place] 09/15/2021, 4:06 PM

## 2021-09-15 NOTE — Progress Notes (Signed)
[facility]  Date of Admission:  09/09/2021   Total days of inpatient antibiotics 6  Principal Problem:   Symptomatic anemia Active Problems:   Non-small cell carcinoma of left lung, stage 3 (HCC)   Pleural effusion on left   Hyponatremia   Persistent atrial fibrillation (HCC)   Pulmonary embolism (HCC)   Protein-calorie malnutrition, severe (HCC)   Pleural effusion   Bacteremia          Assessment:  88 YM with non-small cell lung cancer of LUL Ds in 2022 SP chemotherapy admitted for weakness/anemia. Blood Cx+ MRSA. CT showed aortic perforation along with hepatic metastasis and moderate left and right pleural effusion. Taken to OR for EVAR with vascular surgery.  Recent hospitalization: Pt had recently been diagnosed with afib and PE been taken to OR for volvulus.  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 Denies any prior Hx of of infectious process, denies hardware. Etiology of bacteremia is unclear, could be hospital acquired as he had a recent intervention for volvulus(lap procedure in October 2022 at [facility]).  -TTE showed mild thickening of the aortic valve. TEE showed no vegetation -PE with right ileofemoral DVT on anticoagulation -Pt had been complaining of left knee pain starting during admission, MRI left knee did not showed signs of infection Recommendations: -Continue cefazolin, anticipate 4 weeks of antibiotics from negative Cx 12/3-12/30 -Follow repeat blood Cx to ensure clearance   #Deep pelvic abscess 2.6x4.0 cm(decreasing in size) -Per CT read pt has resolving abscess in the pelvis.  -Per wife at bedside one abscess was drained  a few weeks ago at [facility] with negative Cx. Pt has not been on antibiotics.   #Enlarging left pleural effusion with collapse of left lower lobe  SP thoracentesis on 12/8 #New hepatic metastasis in a lung cancer patient  -Cx and cytology pending. Suspect enlarging effusion in the setting of malignancy.  Microbiology:   Antibiotics: Azithromycin 12/2 Ceftriaxone 12/2 Cefazolin 12/3-p  Cultures: Blood 12/2 2/2 MSSA 12/3 NGTD Urine 12/2 NGTD    SUBJECTIVE: Pt reports knee is feeling better, agreeable to MRI. He reports a slight cough which he has on and off.   Interval: Afebrile overnight.    Review of Systems: Review of Systems  All other systems reviewed and are negative.   Scheduled Meds:  sodium chloride   Intravenous Once   Chlorhexidine Gluconate Cloth  6 each Topical Daily   Chlorhexidine Gluconate Cloth  6 each Topical Q0600   diltiazem  120 mg Oral Daily   docusate sodium  100 mg Oral Daily   enoxaparin (LOVENOX) injection  70 mg Subcutaneous Q12H   feeding supplement  237 mL Oral BID BM   folic acid  1 mg Oral Daily   lidocaine       mouth rinse  15 mL Mouth Rinse BID   metoprolol succinate  25 mg Oral Daily   multivitamin with minerals  1 tablet Oral Daily   mupirocin ointment  1 application Nasal BID   pantoprazole  40 mg Oral Daily   potassium chloride  40 mEq Oral Once   rosuvastatin  5 mg Oral Daily   sodium chloride  1 spray Each Nare Once   Continuous Infusions:   ceFAZolin (ANCEF) IV 2 g (09/15/21 2104)   PRN Meds:.acetaminophen **OR** acetaminophen,  alum & mag hydroxide-simeth, bisacodyl, cyclobenzaprine, guaiFENesin-dextromethorphan, lidocaine, metoprolol tartrate, morphine injection, ondansetron **OR** ondansetron (ZOFRAN) IV, oxyCODONE-acetaminophen, phenol, polyethylene glycol, sodium chloride Allergies  Allergen Reactions   Diclofenac Sodium Other (See Comments)    Kidneys were affected    OBJECTIVE: Vitals:   09/15/21 0900 09/15/21 0958 09/15/21 1112 09/15/21 1948  BP: (!) 155/75 122/66 (!) 103/55 106/67  Pulse:   88 92  Resp:   18 16  Temp:   97.7 F (36.5 C) (!) 97.4 F  (36.3 C)  TempSrc:   Oral Oral  SpO2:   91% 93%  Weight:      Height:       Body mass index is 23.82 kg/m.  Physical Exam Constitutional:      General: He is not in acute distress.    Appearance: He is normal weight. He is not toxic-appearing.  HENT:     Head: Normocephalic and atraumatic.     Right Ear: External ear normal.     Left Ear: External ear normal.     Nose: No congestion or rhinorrhea.     Mouth/Throat:     Mouth: Mucous membranes are moist.     Pharynx: Oropharynx is clear.  Eyes:     Extraocular Movements: Extraocular movements intact.     Conjunctiva/sclera: Conjunctivae normal.     Pupils: Pupils are equal, round, and reactive to light.  Cardiovascular:     Rate and Rhythm: Normal rate and regular rhythm.     Heart sounds: No murmur heard.   No friction rub. No gallop.  Pulmonary:     Effort: Pulmonary effort is normal.     Breath sounds: Normal breath sounds.  Abdominal:     General: Abdomen is flat. Bowel sounds are normal.     Palpations: Abdomen is soft.  Musculoskeletal:        General: No swelling. Normal range of motion.     Cervical back: Normal range of motion and neck supple.  Skin:    General: Skin is warm and dry.  Neurological:     General: No focal deficit present.     Mental Status: He is oriented to person, place, and time.  Psychiatric:        Mood and Affect: Mood normal.      Lab Results Lab Results  Component Value Date   WBC 5.5 09/14/2021   HGB 10.0 (L) 09/14/2021   HCT 31.4 (L) 09/14/2021   MCV 94.9 09/14/2021   PLT 176 09/14/2021    Lab Results  Component Value Date   CREATININE 0.77 09/14/2021   BUN 12 09/14/2021   NA 134 (L) 09/14/2021   K 3.5 09/14/2021   CL 106 09/14/2021   CO2 20 (L) 09/14/2021    Lab Results  Component Value Date   ALT 13 09/09/2021   AST 14 (L) 09/09/2021   ALKPHOS 83 09/09/2021   BILITOT 0.3 09/09/2021        Laurice Record, [facility] 09/15/2021, 9:18 PM

## 2021-09-16 ENCOUNTER — Inpatient Hospital Stay: Payer: Self-pay

## 2021-09-16 ENCOUNTER — Encounter (HOSPITAL_COMMUNITY): Payer: Self-pay | Admitting: Cardiovascular Disease

## 2021-09-16 DIAGNOSIS — Z8679 Personal history of other diseases of the circulatory system: Secondary | ICD-10-CM

## 2021-09-16 DIAGNOSIS — E860 Dehydration: Secondary | ICD-10-CM

## 2021-09-16 DIAGNOSIS — E440 Moderate protein-calorie malnutrition: Secondary | ICD-10-CM

## 2021-09-16 DIAGNOSIS — Z95828 Presence of other vascular implants and grafts: Secondary | ICD-10-CM

## 2021-09-16 LAB — CULTURE, BLOOD (ROUTINE X 2)
Culture: NO GROWTH
Culture: NO GROWTH
Special Requests: ADEQUATE
Special Requests: ADEQUATE

## 2021-09-16 LAB — GRAM STAIN

## 2021-09-16 MED ORDER — METOPROLOL TARTRATE 25 MG PO TABS
25.0000 mg | ORAL_TABLET | Freq: Two times a day (BID) | ORAL | Status: DC
  Administered 2021-09-16: 25 mg via ORAL
  Filled 2021-09-16 (×2): qty 1

## 2021-09-16 MED ORDER — BISACODYL 10 MG RE SUPP
10.0000 mg | Freq: Once | RECTAL | Status: DC
  Filled 2021-09-16: qty 1

## 2021-09-16 NOTE — Progress Notes (Signed)
Occupational Therapy Treatment Patient Details Name: Leon Lyons MRN: [id number] DOB: Apr 23, 1933 Today's Date: 09/16/2021   History of present illness Pt is an 87 y.o. male admitted 09/09/21 with generalized weakness; of note, recent admission for GIB from volvulus anastomosis and symptomatic anemia. CXR with LUL infiltrate, L pleural effusion. Chest CT with RLL PE, R 5th rib metastatic lesion. Abdominal CT with R iliofemoral DVT. Pt also found to have MSSA bacteremia and aortic leak at bifurcation of aorta s/p endovascular repair on 12/4. S/p TEE 12/7 without vegetations. S/p L thoracentesis 12/8. Pt with c/o L knee pain and swelling, MRI showed small joint effusion, no signs of injury or infection. Other PMH includes lung CA, afib, PE.   OT comments  Pt. Seen for skilled OT session with mobility tech. Present for session also.  Attempted lb adls while seated, pt. States unable to, and deferred attempts with suggested modifications.  Utilized stedy equipment for transfer from chair back to bed per pt. Request.  Encouragement to use b ues to pull and hold standing position during stedy positioning.  Pt. Able to use b ues and stand for placement of sitting portions.  Max a x2 for bed mobility back to bed with cues required for trunk and b les management.    Recommendations for follow up therapy are one component of a multi-disciplinary discharge planning process, led by the attending physician.  Recommendations may be updated based on patient status, additional functional criteria and insurance authorization.    Follow Up Recommendations  Acute inpatient rehab (3hours/day)    Assistance Recommended at Discharge Frequent or constant Supervision/Assistance  Equipment Recommendations  Other (comment)    Recommendations for Other Services      Precautions / Restrictions Precautions Precautions: Fall Precaution Comments: watch HR and SpO2 Restrictions Weight Bearing Restrictions: No        Mobility Bed Mobility Overal bed mobility: Needs Assistance Bed Mobility: Sit to Supine     Supine to sit: Mod assist;HOB elevated Sit to supine: Max assist;+2 for physical assistance   General bed mobility comments: 2 person assistance to manage stedy equipment and guide pts. trunk and b les into bed    Transfers Overall transfer level: Needs assistance Equipment used: Ambulation equipment used Transfers: Bed to chair/wheelchair/BSC;Sit to/from Stand Sit to Stand: +2 physical assistance;Mod assist           General transfer comment: Heavy mod A +2 to stand from stedy onto eob; cues for upright posture and knee extension; able to stand 10-15 seconds. Mod A +2 for eccentric control lowering to eob from chair. Transfer via Lift Equipment: Stedy   Balance Overall balance assessment: Needs assistance Sitting-balance support: Feet supported;Single extremity supported Sitting balance-Leahy Scale: Fair       Standing balance-Leahy Scale: Zero Standing balance comment: Pt requrring +2 assist to stand in [place] with BUE support                           ADL either performed or assessed with clinical judgement   ADL Overall ADL's : Needs assistance/impaired                     Lower Body Dressing: Maximal assistance;Sitting/lateral leans Lower Body Dressing Details (indicate cue type and reason): attempted cross legs over each knee, pt. lifted up and said "thats it cant do it".  encouraged to try to cross leg over and he said "thats it".  pointed  to wife and states "she does that for me".  but also reported he was able to perform LB dressing Toilet Transfer: +2 for safety/equipment;Minimal assistance (used stedy) [employer] forces technical officer Details (indicate cue type and reason): simulated with use of stedy from chair to bed 2 person assist for guiding pt. and equipement safely           General ADL Comments: limited progression as pt. for adls secondary to pt. stating  he was unable to attempt lb adls.  utilized stedy equipment for transfer back to bed pt. reporting he is unable to stand without assistance noting LUE limited function along with LLE    Extremity/Trunk Assessment              Vision       Perception     Praxis      Cognition Arousal/Alertness: Awake/alert Behavior During Therapy: WFL for tasks assessed/performed Overall Cognitive Status: Within Functional Limits for tasks assessed                                 General Comments: Pt agreeable to therapy today without encouragement. Stated he knows he needs to do it.          Exercises     Shoulder Instructions       General Comments HR in 120s towards beginning of session, up to 188 with standing, recovered to 134 with prolonged sitting rest break and cues for pursed lip breathing. Pt states he often forgets to breathe. SpO2 95% on RA at end of session. Wife present during session and supportive.    Pertinent Vitals/ Pain       Pain Assessment: No/denies pain Faces Pain Scale: Hurts little more Pain Location: gas pain and whole left side Pain Descriptors / Indicators: Aching;Discomfort Pain Intervention(s): Limited activity within patient's tolerance;Monitored during session;Repositioned  Home Living                                          Prior Functioning/Environment              Frequency  Min 2X/week        Progress Toward Goals  OT Goals(current goals can now be found in the care plan section)  Progress towards OT goals: Progressing toward goals     Plan Discharge plan remains appropriate    Co-evaluation    PT/OT/SLP Co-Evaluation/Treatment: Yes Reason for Co-Treatment: To address functional/ADL transfers;For patient/therapist safety          AM-PAC OT "6 Clicks" Daily Activity     Outcome Measure   Help from another person eating meals?: A Little Help from another person taking care of personal  grooming?: A Little Help from another person toileting, which includes using toliet, bedpan, or urinal?: A Lot Help from another person bathing (including washing, rinsing, drying)?: A Lot Help from another person to put on and taking off regular upper body clothing?: A Little Help from another person to put on and taking off regular lower body clothing?: A Lot 6 Click Score: 15    End of Session Equipment Utilized During Treatment: Oxygen  OT Visit Diagnosis: Unsteadiness on feet (R26.81);Other abnormalities of gait and mobility (R26.89);Muscle weakness (generalized) (M62.81)   Activity Tolerance Patient tolerated treatment well   Patient Left in bed;with call bell/phone within  reach;with family/visitor present   Nurse Communication Other (comment) (rn present during transfer back to bed, and also reviewed with me and mobilty tech. that palliative has met with pt. and family but pt. wanting to pursue cont. therapies.)        Time: 1050-1104 OT Time Calculation (min): 14 min  Charges: OT General Charges $OT Visit: 1 Visit OT Treatments $Self Care/Home Management : 8-22 mins  Sonia Baller, COTA/L Acute Rehabilitation [phone]   Tanya Nones 09/16/2021, 1:13 PM

## 2021-09-16 NOTE — Progress Notes (Signed)
Physical Therapy Treatment Patient Details Name: Leon Lyons MRN: [id number] DOB: [birth year] Today's Date: 09/16/2021   History of Present Illness Pt is an 87 y.o. male admitted 09/09/21 with generalized weakness; of note, recent admission for GIB from volvulus anastomosis and symptomatic anemia. CXR with LUL infiltrate, L pleural effusion. Chest CT with RLL PE, R 5th rib metastatic lesion. Abdominal CT with R iliofemoral DVT. Pt also found to have MSSA bacteremia and aortic leak at bifurcation of aorta s/p endovascular repair on 12/4. S/p TEE 12/7 without vegetations. S/p L thoracentesis 12/8. Pt with c/o L knee pain and swelling, MRI showed small joint effusion, no signs of injury or infection. Other PMH includes lung CA, afib, PE.    PT Comments    Pt progressing with mobility. Able to progress OOB to chair today, requiring +2 physical assist to stand into [place]. Pt continues to be limited by decreased endurance, strength, and activity tolerance. Deferred further therapy after transfer due to elevated HR (MD/RN aware). Pt motivated to participate in therapy. Remains an excellent candidate for intensive therapy to maximize functional mobility and independence prior to return home. Will continue to follow acutely to address established goals.   HR standing: up to 188 After prolonged seated rest break and pursed lip breathing: 138   Recommendations for follow up therapy are one component of a multi-disciplinary discharge planning process, led by the attending physician.  Recommendations may be updated based on patient status, additional functional criteria and insurance authorization.  Follow Up Recommendations  Acute inpatient rehab (3hours/day)     Assistance Recommended at Discharge Frequent or constant Supervision/Assistance  Equipment Recommendations  Wheelchair (measurements PT);Wheelchair cushion (measurements PT);BSC/3in1    Recommendations for Other Services Rehab consult      Precautions / Restrictions Precautions Precautions: Fall Precaution Comments: watch HR and SpO2 Restrictions Weight Bearing Restrictions: No     Mobility  Bed Mobility Overal bed mobility: Needs Assistance Bed Mobility: Supine to Sit     Supine to sit: Mod assist;HOB elevated     General bed mobility comments: Mod A for BLE advancement and trunk elevation, pt able to use arms to assist    Transfers Overall transfer level: Needs assistance Equipment used: Ambulation equipment used Transfers: Bed to chair/wheelchair/BSC;Sit to/from Stand Sit to Stand: +2 physical assistance;Mod assist           General transfer comment: Heavy mod A +2 to stand from EOB into [place]; cues for upright posture and knee extension; able to stand 10-15 seconds. Mod A +2 for eccentric control lowering to chair from [place]. Cues for pursed lip breathing. Deferred further therapy after transfer due to elevated HR. Transfer via Lift Equipment: Stedy  Ambulation/Gait                   Stairs             Wheelchair Mobility    Modified Rankin (Stroke Patients Only)       Balance Overall balance assessment: Needs assistance Sitting-balance support: Feet supported;Single extremity supported Sitting balance-Leahy Scale: Fair       Standing balance-Leahy Scale: Zero Standing balance comment: Pt requrring +2 assist to stand in [place] with BUE support                            Cognition Arousal/Alertness: Awake/alert Behavior During Therapy: WFL for tasks assessed/performed Overall Cognitive Status: Within Functional Limits for tasks assessed  General Comments: Pt agreeable to therapy today without encouragement. Stated he knows he needs to do it.        Exercises      General Comments General comments (skin integrity, edema, etc.): HR in 120s towards beginning of session, up to 188 with standing, recovered to 134  with prolonged sitting rest break and cues for pursed lip breathing. Pt states he often forgets to breathe. SpO2 95% on RA at end of session. Wife present during session and supportive.      Pertinent Vitals/Pain Faces Pain Scale: Hurts little more Pain Location: gas pain and whole left side Pain Descriptors / Indicators: Aching;Discomfort Pain Intervention(s): Limited activity within patient's tolerance;Monitored during session;Repositioned    Home Living                          Prior Function            PT Goals (current goals can now be found in the care plan section) Acute Rehab PT Goals Patient Stated Goal: agrees to rehab PT Goal Formulation: With patient/family Time For Goal Achievement: 09/27/21 Potential to Achieve Goals: Good Progress towards PT goals: Progressing toward goals    Frequency    Min 3X/week      PT Plan Current plan remains appropriate    Co-evaluation              AM-PAC PT "6 Clicks" Mobility   Outcome Measure  Help needed turning from your back to your side while in a flat bed without using bedrails?: A Little Help needed moving from lying on your back to sitting on the side of a flat bed without using bedrails?: A Lot Help needed moving to and from a bed to a chair (including a wheelchair)?: Total Help needed standing up from a chair using your arms (e.g., wheelchair or bedside chair)?: Total Help needed to walk in hospital room?: Total Help needed climbing 3-5 steps with a railing? : Total 6 Click Score: 9    End of Session Equipment Utilized During Treatment: Gait belt Activity Tolerance: Patient limited by fatigue;Other (comment) (limited by increased HR) Patient left: in chair;with call bell/phone within reach;with family/visitor present Nurse Communication: Mobility status (elevated HR) PT Visit Diagnosis: Other abnormalities of gait and mobility (R26.89);Muscle weakness (generalized) (M62.81)     Time:  8099-8338 PT Time Calculation (min) (ACUTE ONLY): 22 min  Charges:  $Therapeutic Activity: 8-22 mins                    Brandon Melnick, SPT    Brandon Melnick 09/16/2021, 11:09 AM

## 2021-09-16 NOTE — Progress Notes (Signed)
Daily Progress Note   Patient Name: Leon Lyons       Date: 06/10/3299 DOB: Apr 13, 1933  Age: 87 y.o. MRN#: [id number] Attending Physician: Barb Merino, MD Primary Care Physician: [name], MD Admit Date: 09/09/2021 Length of Stay: 6 days  Reason for Consultation/Follow-up: Establishing goals of care  HPI/Patient Profile:  87 y.o. male  with past medical history of 87 year old gentleman with history of metastatic lung cancer stage IIIb, recent diagnosis of A. fib and pulmonary embolism after an abdominal surgery for volvulus presented with generalized weakness and shortness of breath on exertion.  In the emergency room, chest x-ray with left upper lobe infiltrate and left pleural effusion.  CT scan of the chest with left perihilar postradiation changes and fibrosis groundglass opacities, right fifth rib metastatic lesion, right lower lobe pulmonary embolus with pulmonary infarction.  CT scan of the abdomen with right iliofemoral DVT.  Contained aortic leak at the aortic bifurcation with periaortic hematoma and hemoglobin of 7.4. Remains in the hospital with MSSA bacteremia, vascular intervention. Oncology plans to follow-up as outpatient; not a current candidate for chemo given infection and need for rehab.   Patient was noted to be very overwhelmed with everything going on. Agreed to palliative consult for GOC and support.  Subjective:   Subjective: Chart Reviewed. Updates received. Patient Assessed. Created space and opportunity for patient  and family to explore thoughts and feelings regarding current medical situation.  Patient/Family Understanding of Illness: We briefly reviewed the patient's understanding of his illness now that he is awake and able to participate in conversation.  He expresses that he has had a lot go wrong lately.  He has generally been healthy most of his life until the past year.  Life Review: They moved from [place] to [place] in June 2020.   The patient was raised in [place].  He lost his mom 04/16/2019.  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  When he was in the [employer] he was stationed at [place] [employer] near [place].  He enjoys outdoors, yard work, and similar hobbies.   Patient Values: He values family, religion, independence   Goals: His goals are to get back to a new normal.  He would like to be able to walk.  He also describes that he does not want to be a burden on his wife, as they are planning for outpatient antibiotics.  Today's Discussion: We had a general discussion about his current situation, plans for near discharge.  We discussed specifically the advancement of his cancer which is now metastasized to his fifth rib and his liver.  He states that he is unlikely to undergo further chemotherapy but he would like to speak with Dr. Earlie Server as an outpatient to get his take on things.  They are talking about sending him home with a PICC line for continued IV antibiotics for his sepsis.  There is also discussion about attempts at inpatient rehab to see if they can get him stronger and able to walk and be more functional, which is in line with his goals.  We did discuss some specific decisions, specifically he would not want a feeding tube.  He would be okay with hospital admission, fluids, antibiotics.  We discussed that as he sees outpatient oncology and further work-up and information is available that he will need continued goals of care conversations to be able  to make appropriate decisions in each decision point.  Recommended outpatient palliative care and they have accepted.  I provided emotional and general support through therapeutic listening, empathy, reminiscing, and other techniques.  I answered all questions and addressed all concerns to the best my ability.  Review of Systems  Constitutional:  Positive for fatigue.   Respiratory:  Negative for shortness of breath.   Cardiovascular:  Negative for chest pain.  Gastrointestinal:  Negative for abdominal pain, nausea and vomiting.  Neurological:  Positive for weakness.   Objective:   Vital Signs:  BP 128/78 (BP Location: Right Arm)   Pulse (!) 102   Temp 98.2 F (36.8 C) (Axillary)   Resp 17   Ht 5\' 10"  (1.778 m)   Wt 75.3 kg   SpO2 94%   BMI 23.82 kg/m   Physical Exam: Physical Exam Vitals and nursing note reviewed.  Constitutional:      General: He is not in acute distress.    Appearance: He is ill-appearing. He is not toxic-appearing.  HENT:     Head: Normocephalic and atraumatic.  Cardiovascular:     Rate and Rhythm: Normal rate and regular rhythm.  Pulmonary:     Effort: Pulmonary effort is normal. No respiratory distress.     Breath sounds: No wheezing or rhonchi.  Abdominal:     General: Abdomen is flat.     Palpations: Abdomen is soft.  Skin:    General: Skin is warm and dry.  Neurological:     General: No focal deficit present.     Mental Status: He is alert.  Psychiatric:        Mood and Affect: Mood normal.        Behavior: Behavior normal.    Palliative Assessment/Data: 40-50%   Assessment & Plan:   Impression: Present on Admission:  Non-small cell carcinoma of left lung, stage 3 (HCC)  Pleural effusion on left  Symptomatic anemia  Hyponatremia  Persistent atrial fibrillation (HCC)  Pulmonary embolism (HCC)  Protein-calorie malnutrition, severe (HCC)  Pleural effusion  87 year old male with significant decline over the past year.  Recently diagnosed with stage III lung cancer now with apparent mets to fifth rib and liver, status post thoracentesis.  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  He has A. fib.  Now with pulmonary embolism with right lower lobe pulmonary infarct, aortic bifurcation leak and hematoma formation, DVT, MSSA undergoing work-up by infectious disease.  Overall  recognition of downward trajectory, hospitalist feels overall poor prognosis with lung cancer and DVT/PE.  The patient has noted that he would not want a PEG tube.  He states he is unlikely to undergo further chemotherapy, but would like to speak with oncologist as an outpatient.  He is also agreeable to rehab, if he is a candidate, order to support his goals of getting back to more normal, improved function, not being able to walk.  He is okay with home health antibiotics via PICC line if that is required.  SUMMARY OF RECOMMENDATIONS   Continue current treatments Continue DNR Continue evaluation for rehab Agreeable to pick line for antibiotics Agreeable to rehab if it would help him get more functional Recommend outpatient palliative care at discharge PT will follow peripherally, please call us if we can be of further assistance  Code Status: DNR  Prognosis: Unable to determine  Discharge Planning: To Be Determined  Discussed with: Medical team, nursing team, patient, patient's family, [facility] [place] [facility] colleague  Thank you  for allowing [place] to participate in the care of Tymar Polyak Goranson PMT will continue to support holistically.  Time Total: 75 min  Visit consisted of counseling and education dealing with the complex and emotionally intense issues of symptom management and palliative care in the setting of serious and potentially life-threatening illness. Greater than 50%  of this time was spent counseling and coordinating care related to the above assessment and plan.  Walden Field, NP Palliative Medicine Team  Team Phone # [phone] (Nights/Weekends)  06/07/2021, 8:17 AM

## 2021-09-16 NOTE — Progress Notes (Signed)
PHARMACY CONSULT NOTE FOR:  OUTPATIENT  PARENTERAL ANTIBIOTIC THERAPY (OPAT)  Indication: MSSA bacteremia Regimen: Cefazolin 2g IV every 8 hours End date: 10/08/21  IV antibiotic discharge orders are pended. To discharging provider:  please sign these orders via discharge navigator,  Select New Orders & click on the button choice - Manage This Unsigned Work.     Thank you for allowing pharmacy to be a part of this patient's care.  Alycia Rossetti, PharmD, BCPS Clinical Pharmacist 09/16/2021 12:16 PM   **Pharmacist phone directory can now be found on [email or web address] ([email or web address]).  Listed under [place].

## 2021-09-16 NOTE — TOC Initial Note (Signed)
Transition of Care (TOC) - Initial/Assessment Note  Marvetta Gibbons RN, BSN Transitions of Care Unit 4E- RN Case Manager See Treatment Team for direct phone #    Patient Details  Name: Leon Lyons MRN: [id number] Date of Birth: [birth year]  Transition of Care [facility]) CM/SW Contact:    Dawayne Patricia, RN Phone Number: 09/16/2021, 4:03 PM  Clinical Narrative:                 Pt tx from [facility] for [facility], from home w/ wife. Noted plan for 4wks of IV abx- pt pending PICC line placement.  Recommendations for CIR- CM in to speak with pt and wife at beside regarding thoughts on transition plan. Pt is thinking about INPT rehab vs going home with [facility]. Discussed with pt the differences between Home w/ [facility] and INPT rehab. Listened to pt's concerns and answered questions. Patient and wife feel pt would benefit from [facility] rehab however pt has questions/concerns- have offered pt to have someone from CIR come and speak with them to see if he would be a good candidate. Pt states he would like to speak with someone from CIR- will call [facility] to come speak with them to answer questions.  List provided to pt and wife for [facility] choice should he choice to go home w/ [facility]- Per CMS guidelines from medicare.gov website with star ratings (copy placed in shadow chart) - they state that they would want to use [facility] for any HH needs, explained that referral would have to be accepted pending confirmation with the [facility] agency that they could provide needed services at the time pt was returning home. They voiced understanding. Also discussed need to coordinate with a home infusion company for home abx needs- they are agreeable to using [facility] per ID referral. (Any HH referrals will be made by [facility] when pt ready to transition home)  [facility] has informed CM that pt and wife would like to have [facility] rehab stay prior to return home. CIR will offer bed- they will plan for admit over the weekend pending PICC  line placement and medical readiness.  Updated Pam with [facility].   Expected Discharge Plan: IP Rehab Facility Barriers to Discharge: Other (must enter comment) (PICC line placement pending)   Patient Goals and CMS Choice Patient states their goals for this hospitalization and ongoing recovery are:: rehab then hopeful to return home CMS Medicare.gov Compare Post Acute Care list provided to:: Patient Choice offered to / list presented to : Patient, Spouse  Expected Discharge Plan and Services Expected Discharge Plan: [place]   Discharge Planning Services: CM Consult Post Acute Care Choice: IP Rehab Living arrangements for the past 2 months: Single Family Home                           HH Arranged: IV Antibiotics          Prior Living Arrangements/Services Living arrangements for the past 2 months: Single Family Home Lives with:: Spouse Patient language and need for interpreter reviewed:: Yes Do you feel safe going back to the place where you live?: Yes      Need for Family Participation in Patient Care: Yes (Comment) Care giver support system in place?: Yes (comment) Current home services: DME Criminal Activity/Legal Involvement Pertinent to Current Situation/Hospitalization: No - Comment as needed  Activities of Daily Living Home Assistive Devices/Equipment: None ADL Screening (condition at  time of admission) Patient's cognitive ability adequate to safely complete daily activities?: Yes Is the patient deaf or have difficulty hearing?: No Does the patient have difficulty seeing, even when wearing glasses/contacts?: No Does the patient have difficulty concentrating, remembering, or making decisions?: No Patient able to express need for assistance with ADLs?: Yes Does the patient have difficulty dressing or bathing?: No Independently performs ADLs?: Yes (appropriate for developmental age) Does the patient have difficulty walking or climbing  stairs?: Yes Weakness of Legs: Both Weakness of Arms/Hands: Both  Permission Sought/Granted Permission sought to share information with : Facility Art therapist granted to share information with : Yes, Verbal Permission Granted     Permission granted to share info w AGENCY: [facility]        Emotional Assessment Appearance:: Appears stated age Attitude/Demeanor/Rapport: Engaged Affect (typically observed): Accepting, Appropriate, Pleasant Orientation: : Oriented to Self, Oriented to Place, Oriented to  Time, Oriented to Situation Alcohol / Substance Use: Not Applicable Psych Involvement: No (comment)  Admission diagnosis:  Dehydration [E86.0] [place] [K92.1] Pleural effusion on left [J90] Atrial fibrillation with rapid ventricular response (HCC) [I48.91] On bridging treatment with lovenox [Z79.01] Non-small cell cancer of left lung (HCC) [C34.92] Symptomatic anemia [D64.9] Gastrointestinal hemorrhage, unspecified gastrointestinal hemorrhage type [K92.2] Pleural effusion [J90] Patient Active Problem List   Diagnosis Date Noted   Malnutrition of moderate degree 09/16/2021   Bacteremia    Pleural effusion 09/10/2021   Pleural effusion on left 09/09/2021   Symptomatic anemia 09/09/2021   Hyponatremia 09/09/2021   Persistent atrial fibrillation ([place]) 09/09/2021   Pulmonary embolism ([place]) 09/09/2021   Protein-calorie malnutrition, severe ([place]) 09/09/2021   Encounter for antineoplastic immunotherapy 04/19/2021   Neutropenia ([place]) 03/14/2021   Non-small cell carcinoma of left lung, stage 3 ([place]) 01/12/2021   Encounter for antineoplastic chemotherapy 01/12/2021   Lung mass 12/29/2020   PCP:  [name], MD Pharmacy:   [id number] - [place], [place] - [address] [place] [address] Phone: [phone] Fax: [phone]  [facility] [address] [place] [address] Phone:  [phone] Fax: [phone]     Social Determinants of Health (SDOH) Interventions    Readmission Risk Interventions No flowsheet data found.

## 2021-09-16 NOTE — Progress Notes (Signed)
Inpatient Rehab Admissions Coordinator:   Met with patient and his wife at the bedside.  He is much more alert today, oriented, and able to discuss CIR with me.  I shared expectations and goals, and he shared his concerns about whether he can tolerate CIR with his debility and pain in his shoulder and knee on the left side.  I do feel like he could tolerate the intensity of CIR, especially broken up throughout the day.  We will follow for possible admission pending bed availability and medical readiness.   Shann Medal, PT, DPT Admissions Coordinator [phone] 09/16/21  4:11 PM

## 2021-09-16 NOTE — Progress Notes (Signed)
PROGRESS NOTE    Leon Lyons  RFF:[id number] DOB: 02/09/1933 DOA: 09/09/2021 PCP: [name], MD    Brief Narrative:  86 year old gentleman with history of metastatic lung cancer stage IIIb, recent diagnosis of A. fib and pulmonary embolism after an abdominal surgery for volvulus presented with generalized weakness and shortness of breath on exertion.  In the emergency room, chest x-ray with left upper lobe infiltrate and left pleural effusion.  CT scan of the chest with left perihilar postradiation changes and fibrosis groundglass opacities, right fifth rib metastatic lesion, right lower lobe pulmonary embolus with pulmonary infarction.  CT scan of the abdomen with right iliofemoral DVT.  Contained aortic leak at the aortic bifurcation with periaortic hematoma.  Hemoglobin 7.4. Remains in the hospital with MSSA bacteremia, vascular intervention.   Assessment & Plan:   Principal Problem:   Symptomatic anemia Active Problems:   Non-small cell carcinoma of left lung, stage 3 (HCC)   Pleural effusion on left   Hyponatremia   Persistent atrial fibrillation (HCC)   Pulmonary embolism (HCC)   Protein-calorie malnutrition, severe (HCC)   Pleural effusion   Bacteremia  Acute symptomatic anemia due to surgical colonic anastomosis bleeding: Acute on chronic anemia.  Anemia of chronic disease including metastatic lung cancer. -Received total 3 units of PRBC transfusion.  Hemoglobin appropriately responded and remains stable. GI recommended conservative management.  No evidence of active bleeding now.  Aortic leak now status post endovascular repair: Vascular following.  Currently tolerating anticoagulation with Lovenox.  Vascular surgery plans to recheck CT scan in 4 weeks.  A. fib with RVR: Previously controlled A. fib.  Now rate control is challenging due to ongoing  medical issues.  Currently on Toprol-XL 25 mg daily, will increase to metoprolol 25 mg twice a day.  First dose now.   Continue Cardizem 120 mg.  Therapeutic on Lovenox.    MSSA bacteremia: Initial blood cultures 12/2 positive for MSSA.  Repeat blood cultures negative.  TTE without evidence of vegetation. TEE with no evidence of vegetation.  Followed by ID.  Remains on cefazolin antibiotic therapy. Left knee MRI with no evidence of septic arthritis. Left thoracentesis with no evidence of infection.  Cultures pending. Cefazolin for 4 weeks, PICC line today and pharmacy to make outpatient plan.  Stage III non-small cell lung cancer/pulmonary embolism/right iliofemoral DVT: Proximal DVT and pulmonary embolism in a patient with metastatic lung cancer usually associated with poor prognosis. Seen by oncology in the hospital, they plan to follow-up outpatient once his infection is improved. Will keep patient on Lovenox, currently tolerating. Frail debilitated and deconditioned.  Prognosis is poor. Recommended conversation with palliative care and having family meeting today. Adequate pain medications with bowel regimen.  Additional Dulcolax suppository today. Encouraged to discuss goal of care.  He may even qualify for home with home hospice if agreeable.  Hypokalemia: Replaced and adequate.   DVT prophylaxis: SCD's Start: 09/11/21 1206 SCDs Start: 09/09/21 1609   Code Status: Full code Family Communication: Wife at the bedside Disposition Plan: Status is: Inpatient  Remains inpatient appropriate because: Inpatient procedure planned.  Patient on IV antibiotics.   Consultants:  Hematology oncology Vascular Palliative  Procedures:  Multiple procedures as above.  Antimicrobials:  Cefazolin 12/2---   Subjective: Patient seen and examined.  Sitting in chair and looks anxious.  He had some pain in his back and needed Percocet. Heart rate control remains challenging.  He is with rapid heart rate in the monitor. Looks more frail and debilitated  and anxious.  Wife at the bedside. Palliative care provider  also at the bedside.  Objective: Vitals:   09/15/21 1948 09/15/21 2358 09/16/21 0452 09/16/21 0739  BP: 106/67 123/62 125/65 128/78  Pulse: 92 96 99 (!) 102  Resp: 16 15 18 17   Temp: (!) 97.4 F (36.3 C) 97.6 F (36.4 C) 98.1 F (36.7 C) 98.2 F (36.8 C)  TempSrc: Oral Oral Axillary Axillary  SpO2: 93% 95% 94% 94%  Weight:      Height:        Intake/Output Summary (Last 24 hours) at 09/16/2021 1153 Last data filed at 09/16/2021 0452 Gross per 24 hour  Intake 720 ml  Output 1100 ml  Net -380 ml   Filed Weights   09/10/21 0954 09/11/21 0002 09/14/21 1330  Weight: 74.8 kg 75.3 kg 75.3 kg    Examination:  General exam: Frail and debilitated.  Chronically sick looking.  Anxious.   Respiratory system: On room air.  Poor air entry on the left side.   Cardiovascular system: S1 & S2 heard, irregularly irregular.  Tachycardic. Gastrointestinal system: Abdomen is nondistended, soft and nontender. No organomegaly or masses felt. Normal bowel sounds heard. Central nervous system: Alert and oriented. No focal neurological deficits.  Generalized weakness. Extremities: Symmetric 5 x 5 power. Patient has a small tender spot on left prepatellar area.  No evidence of joint effusion.    Data Reviewed: I have personally reviewed following labs and imaging studies  CBC: Recent Labs  Lab 09/09/21 1248 09/10/21 0833 09/11/21 0830 09/11/21 0957 09/12/21 0136 09/14/21 0200  WBC 10.5 9.1  --  8.1 10.9* 5.5  NEUTROABS 9.0*  --   --   --   --   --   HGB 7.4* 8.0* 9.5* 9.7* 10.6* 10.0*  HCT 24.0* 25.0* 28.0* 30.3* 33.5* 31.4*  MCV 98.0 96.2  --  93.2 92.8 94.9  PLT 244 222  --  160 140* 597   Basic Metabolic Panel: Recent Labs  Lab 09/09/21 1248 09/10/21 0510 09/11/21 0830 09/11/21 0957 09/12/21 0136 09/14/21 0200  NA 129* 131* 138 135 134* 134*  K 3.7 4.1 3.3* 3.3* 4.4 3.5  CL 98 105  --  107 105 106  CO2 25 23  --  24 22 20*  GLUCOSE 130* 87  --  106* 117* 93  BUN 21 17   --  10 11 12   CREATININE 0.85 0.76  --  0.66 0.78 0.77  CALCIUM 8.3* 7.8*  --  8.1* 8.3* 7.9*  MG  --   --   --  1.9  --   --   PHOS  --  3.0  --   --   --   --    GFR: Estimated Creatinine Clearance: 68.4 mL/min (by C-G formula based on SCr of 0.77 mg/dL). Liver Function Tests: Recent Labs  Lab 09/09/21 1248 09/10/21 0510  AST 14*  --   ALT 13  --   ALKPHOS 83  --   BILITOT 0.3  --   PROT 6.6  --   ALBUMIN 1.8* 1.6*   No results for input(s): LIPASE, AMYLASE in the last 168 hours. No results for input(s): AMMONIA in the last 168 hours. Coagulation Profile: Recent Labs  Lab 09/09/21 1248 09/11/21 0957  INR 1.2 1.4*   Cardiac Enzymes: No results for input(s): CKTOTAL, CKMB, CKMBINDEX, TROPONINI in the last 168 hours. BNP (last 3 results) No results for input(s): PROBNP in the last 8760 hours. HbA1C: No  results for input(s): HGBA1C in the last 72 hours. CBG: No results for input(s): GLUCAP in the last 168 hours. Lipid Profile: No results for input(s): CHOL, HDL, LDLCALC, TRIG, CHOLHDL, LDLDIRECT in the last 72 hours. Thyroid Function Tests: No results for input(s): TSH, T4TOTAL, FREET4, T3FREE, THYROIDAB in the last 72 hours. Anemia Panel: No results for input(s): VITAMINB12, FOLATE, FERRITIN, TIBC, IRON, RETICCTPCT in the last 72 hours. Sepsis Labs: Recent Labs  Lab 09/09/21 1248 09/10/21 0510 09/11/21 0335  PROCALCITON  --  0.62 0.47  LATICACIDVEN 1.8  --   --     Recent Results (from the past 240 hour(s))  Blood Culture (routine x 2)     Status: Abnormal   Collection Time: 09/09/21 12:48 PM   Specimen: BLOOD  Result Value Ref Range Status   Specimen Description   Final    BLOOD LEFT ANTECUBITAL Performed at [facility] [address] [place], [place] [address]    Special Requests   Final    BOTTLES DRAWN AEROBIC AND ANAEROBIC Blood Culture results may not be optimal due to an excessive volume of blood received in culture  bottles Performed at [facility] [address], [place], [place] [address]    Culture  Setup Time   Final    GRAM POSITIVE COCCI IN BOTH AEROBIC AND ANAEROBIC BOTTLES CRITICAL RESULT CALLED TO, READ BACK BY AND VERIFIED WITH: PHARMD ELLEN JACKSON 09/10/21@5 :10 BY TW Performed at [facility], [place] [address] [place], [place] [address]    Culture STAPHYLOCOCCUS AUREUS (A)  Final   Report Status 09/12/2021 FINAL  Final   Organism ID, Bacteria STAPHYLOCOCCUS AUREUS  Final      Susceptibility   Staphylococcus aureus - MIC*    CIPROFLOXACIN <=0.5 SENSITIVE Sensitive     ERYTHROMYCIN <=0.25 SENSITIVE Sensitive     GENTAMICIN <=0.5 SENSITIVE Sensitive     OXACILLIN 0.5 SENSITIVE Sensitive     TETRACYCLINE <=1 SENSITIVE Sensitive     VANCOMYCIN <=0.5 SENSITIVE Sensitive     TRIMETH/SULFA <=10 SENSITIVE Sensitive     CLINDAMYCIN <=0.25 SENSITIVE Sensitive     RIFAMPIN <=0.5 SENSITIVE Sensitive     Inducible Clindamycin NEGATIVE Sensitive     * STAPHYLOCOCCUS AUREUS  Resp Panel by RT-PCR (Flu A&B, Covid) Nasopharyngeal Swab     Status: None   Collection Time: 09/09/21 12:48 PM   Specimen: Nasopharyngeal Swab; Nasopharyngeal(NP) swabs in vial transport medium  Result Value Ref Range Status   SARS Coronavirus 2 by RT PCR NEGATIVE NEGATIVE Final    Comment: (NOTE) SARS-CoV-2 target nucleic acids are NOT DETECTED.  The SARS-CoV-2 RNA is generally detectable in upper respiratory specimens during the acute phase of infection. The lowest concentration of SARS-CoV-2 viral copies this assay can detect is 138 copies/mL. A negative result does not preclude SARS-Cov-2 infection and should not be used as the sole basis for treatment or other patient management decisions. A negative result may occur with  improper specimen collection/handling, submission of specimen other than nasopharyngeal swab, presence of viral mutation(s) within the areas targeted by this assay, and  inadequate number of viral copies(<138 copies/mL). A negative result must be combined with clinical observations, patient history, and epidemiological information. The expected result is Negative.  Fact Sheet for Patients:  EntrepreneurPulse.com.au  Fact Sheet for Healthcare Providers:  IncredibleEmployment.be  This test is no t yet approved or cleared by the [place] FDA and  has been authorized for detection and/or diagnosis of  SARS-CoV-2 by FDA under an Emergency Use Authorization (EUA). This EUA will remain  in effect (meaning this test can be used) for the duration of the COVID-19 declaration under Section 564(b)(1) of the Act, 21 U.S.C.section 360bbb-3(b)(1), unless the authorization is terminated  or revoked sooner.       Influenza A by PCR NEGATIVE NEGATIVE Final   Influenza B by PCR NEGATIVE NEGATIVE Final    Comment: (NOTE) The Xpert Xpress SARS-CoV-2/FLU/RSV plus assay is intended as an aid in the diagnosis of influenza from Nasopharyngeal swab specimens and should not be used as a sole basis for treatment. Nasal washings and aspirates are unacceptable for Xpert Xpress SARS-CoV-2/FLU/RSV testing.  Fact Sheet for Patients: EntrepreneurPulse.com.au  Fact Sheet for Healthcare Providers: IncredibleEmployment.be  This test is not yet approved or cleared by the [place] FDA and has been authorized for detection and/or diagnosis of SARS-CoV-2 by FDA under an Emergency Use Authorization (EUA). This EUA will remain in effect (meaning this test can be used) for the duration of the COVID-19 declaration under Section 564(b)(1) of the Act, 21 U.S.C. section 360bbb-3(b)(1), unless the authorization is terminated or revoked.  Performed at [facility], [place] [address], [place], [place] [address]   Blood Culture ID Panel (Reflexed)     Status: Abnormal   Collection Time:  09/09/21 12:48 PM  Result Value Ref Range Status   Enterococcus faecalis NOT DETECTED NOT DETECTED Final   Enterococcus Faecium NOT DETECTED NOT DETECTED Final   Listeria monocytogenes NOT DETECTED NOT DETECTED Final   Staphylococcus species DETECTED (A) NOT DETECTED Final    Comment: CRITICAL RESULT CALLED TO, READ BACK BY AND VERIFIED WITH: PHARMD ELLEN JACKSON 09/10/21@5 :10 BY TW    Staphylococcus aureus (BCID) DETECTED (A) NOT DETECTED Final    Comment: CRITICAL RESULT CALLED TO, READ BACK BY AND VERIFIED WITH: PHARMD ELLEN JACKSON 09/10/21@5 :10 BY TW    Staphylococcus epidermidis NOT DETECTED NOT DETECTED Final   Staphylococcus lugdunensis NOT DETECTED NOT DETECTED Final   Streptococcus species NOT DETECTED NOT DETECTED Final   Streptococcus agalactiae NOT DETECTED NOT DETECTED Final   Streptococcus pneumoniae NOT DETECTED NOT DETECTED Final   Streptococcus pyogenes NOT DETECTED NOT DETECTED Final   A.calcoaceticus-baumannii NOT DETECTED NOT DETECTED Final   Bacteroides fragilis NOT DETECTED NOT DETECTED Final   Enterobacterales NOT DETECTED NOT DETECTED Final   Enterobacter cloacae complex NOT DETECTED NOT DETECTED Final   Escherichia coli NOT DETECTED NOT DETECTED Final   Klebsiella aerogenes NOT DETECTED NOT DETECTED Final   Klebsiella oxytoca NOT DETECTED NOT DETECTED Final   Klebsiella pneumoniae NOT DETECTED NOT DETECTED Final   Proteus species NOT DETECTED NOT DETECTED Final   Salmonella species NOT DETECTED NOT DETECTED Final   Serratia marcescens NOT DETECTED NOT DETECTED Final   Haemophilus influenzae NOT DETECTED NOT DETECTED Final   Neisseria meningitidis NOT DETECTED NOT DETECTED Final   Pseudomonas aeruginosa NOT DETECTED NOT DETECTED Final   Stenotrophomonas maltophilia NOT DETECTED NOT DETECTED Final   Candida albicans NOT DETECTED NOT DETECTED Final   Candida auris NOT DETECTED NOT DETECTED Final   Candida glabrata NOT DETECTED NOT DETECTED Final   Candida  krusei NOT DETECTED NOT DETECTED Final   Candida parapsilosis NOT DETECTED NOT DETECTED Final   Candida tropicalis NOT DETECTED NOT DETECTED Final   Cryptococcus neoformans/gattii NOT DETECTED NOT DETECTED Final   Meth resistant mecA/C and MREJ NOT DETECTED NOT DETECTED Final    Comment: Performed at [facility],  [address] [place], [place] [address]  Blood Culture (routine x 2)     Status: Abnormal   Collection Time: 09/09/21 12:53 PM   Specimen: BLOOD  Result Value Ref Range Status   Specimen Description   Final    BLOOD RIGHT ANTECUBITAL Performed at [facility] [address] [place], [place] [address]    Special Requests   Final    BOTTLES DRAWN AEROBIC AND ANAEROBIC Blood Culture adequate volume Performed at [facility] [address] [place], [place] [address]    Culture  Setup Time   Final    GRAM POSITIVE COCCI IN BOTH AEROBIC AND ANAEROBIC BOTTLES CRITICAL VALUE NOTED.  VALUE IS CONSISTENT WITH PREVIOUSLY REPORTED AND CALLED VALUE.    Culture (A)  Final    STAPHYLOCOCCUS AUREUS SUSCEPTIBILITIES PERFORMED ON PREVIOUS CULTURE WITHIN THE LAST 5 DAYS. Performed at [facility], [place] [address], [place], [place] [address]    Report Status 09/12/2021 FINAL  Final  Urine Culture     Status: None   Collection Time: 09/09/21  6:46 PM   Specimen: In/Out Cath Urine  Result Value Ref Range Status   Specimen Description   Final    IN/OUT CATH URINE Performed at [facility] [address] [place], [place] [address]    Special Requests   Final    NONE Performed at [facility], [place] [address] [place], [place] [address]    Culture   Final    NO GROWTH Performed at [facility], [place] [address], [place], [place] [address]    Report Status 09/11/2021 FINAL  Final  Culture, blood (routine x 2)     Status: None   Collection Time: 09/11/21  3:11 AM   Specimen: BLOOD RIGHT  WRIST  Result Value Ref Range Status   Specimen Description BLOOD RIGHT WRIST  Final   Special Requests   Final    BOTTLES DRAWN AEROBIC AND ANAEROBIC Blood Culture adequate volume   Culture   Final    NO GROWTH 5 DAYS Performed at [facility], [place] [address] [place], [place] [address]    Report Status 09/16/2021 FINAL  Final  Culture, blood (routine x 2)     Status: None   Collection Time: 09/11/21  3:11 AM   Specimen: BLOOD LEFT HAND  Result Value Ref Range Status   Specimen Description BLOOD LEFT HAND  Final   Special Requests   Final    BOTTLES DRAWN AEROBIC ONLY Blood Culture adequate volume   Culture   Final    NO GROWTH 5 DAYS Performed at [facility], [place] [address], [place], [place] [address]    Report Status 09/16/2021 FINAL  Final  Surgical pcr screen     Status: Abnormal   Collection Time: 09/11/21  6:27 AM   Specimen: Nasal Mucosa; Nasal Swab  Result Value Ref Range Status   MRSA, PCR NEGATIVE NEGATIVE Final   Staphylococcus aureus POSITIVE (A) NEGATIVE Final    Comment: (NOTE) The Xpert SA Assay (FDA approved for NASAL specimens in patients 61 years of age and older), is one component of a comprehensive surveillance program. It is not intended to diagnose infection nor to guide or monitor treatment. Performed at [facility], [place] [address], [place],  [address]   Gram stain     Status: None   Collection Time: 09/15/21 10:04 AM   Specimen:  Lung, Left; Pleural Fluid  Result Value Ref Range Status   Specimen Description PLEURAL FLUID  Final   Special Requests LEFT LUNG  Final   Gram Stain   Final    WBC PRESENT,BOTH PMN AND MONONUCLEAR GRAM NEGATIVE RODS CYTOSPIN SMEAR Performed at [facility], [address] [place], [place] [address]    Report Status 09/16/2021 FINAL  Final  Culture, body fluid w Gram Stain-bottle     Status: None (Preliminary result)   Collection Time: 09/15/21 10:04 AM   Specimen: Pleura  Result  Value Ref Range Status   Specimen Description PLEURAL FLUID  Final   Special Requests NONE  Final   Culture   Final    NO GROWTH < 24 HOURS Performed at [facility], [place] [address], [place], [place] [address]    Report Status PENDING  Incomplete         Radiology Studies: DG Chest 1 View  Result Date: 09/15/2021 CLINICAL DATA:  Post left thoracentesis.  History of lung cancer EXAM: CHEST  1 VIEW COMPARISON:  09/11/2021 FINDINGS: Decreasing left pleural effusion following thoracentesis. Continued airspace disease throughout much of the left lung. No pneumothorax. Volume loss on the left. Heart is upper limits normal in size. Mild vascular congestion. IMPRESSION: Decreasing left effusion following thoracentesis.  No pneumothorax. Continued volume loss on the left and diffuse left lung airspace disease. Electronically Signed   By: Rolm Baptise M.D.   On: 09/15/2021 09:58   MR KNEE LEFT W WO CONTRAST  Result Date: 09/15/2021 CLINICAL DATA:  Staphylococcus bacteremia. Tenderness along the suprapatellar area. Left knee pain. EXAM: MRI OF THE LEFT KNEE WITHOUT AND WITH CONTRAST TECHNIQUE: Multiplanar, multisequence MR imaging of the left knee was performed both before and after administration of intravenous contrast. CONTRAST:  72mL GADAVIST GADOBUTROL 1 MMOL/ML IV SOLN COMPARISON:  Radiographs dated September 10, 2021 FINDINGS: The exam is limited due to motion. MENISCI Medial: Degenerative changes without evidence of acute tear Lateral: Degenerative changes without evidence of acute tear LIGAMENTS Cruciates: ACL and PCL are intact. Collaterals: Medial collateral ligament is intact. Lateral collateral ligament complex is intact. CARTILAGE Patellofemoral: Advanced articular cartilage thinning. No evidence of focal full-thickness defect. Medial: Advanced articular cartilage thinning without evidence of full-thickness defect. Lateral: Advanced articular cartilage thinning without evidence of  full-thickness defect. JOINT: Small suprapatellar joint effusion. Normal Hoffa's fat-pad. No plical thickening. POPLITEAL FOSSA: Popliteus tendon is intact. No Baker's cyst. EXTENSOR MECHANISM: Intact quadriceps tendon. Intact patellar tendon. Intact lateral patellar retinaculum. Intact medial patellar retinaculum. Intact MPFL. BONES: No aggressive osseous lesion. No fracture or dislocation. Other: No fluid collection or hematoma. Generalized muscle atrophy. Mild subcutaneous soft tissue edema. IMPRESSION: 1. Small joint effusion, likely reactive. No abnormal enhancement or soft tissue abnormality concerning for septic arthritis. 2. Generalized articular cartilage thinning without evidence of full-thickness defect. No evidence of ligamentous injury. 3. The marrow signal is within normal limits. No evidence of fracture or osteonecrosis. Electronically Signed   By: Keane Police D.O.   On: 09/15/2021 16:49   ECHO TEE  Result Date: 09/14/2021    TRANSESOPHOGEAL ECHO REPORT   Patient Name:   TEON HUDNALL Date of Exam: 09/14/2021 Medical Rec #:  [id number]         Height:       70.0 in Accession #:    [id number]        Weight:       166.0 lb Date of Birth:  11/29/1932         BSA:          1.928 m Patient Age:    81 years          BP:           92/49 mmHg Patient Gender: M                 HR:           77 bpm. Exam Location:  Inpatient Procedure: Transesophageal Echo, Cardiac Doppler and Color Doppler Indications:     Bacteremia  History:         Patient has prior history of Echocardiogram examinations, most                  recent 09/11/2021.  Sonographer:     Merrie Roof RDCS Referring Phys:  [id number] Leanor Kail Diagnosing Phys: Sanda Klein MD PROCEDURE: After discussion of the risks and benefits of a TEE, an informed consent was obtained from the patient. The transesophogeal probe was passed without difficulty through the esophogus of the patient. Sedation performed by different physician. The patient  was monitored while under deep sedation. The patient's vital signs; including heart rate, blood pressure, and oxygen saturation; remained stable throughout the procedure. The patient developed no complications during the procedure. IMPRESSIONS  1. Left ventricular ejection fraction, by estimation, is 60 to 65%. The left ventricle has normal function. The left ventricle has no regional wall motion abnormalities.  2. Right ventricular systolic function is normal. The right ventricular size is normal.  3. No left atrial/left atrial appendage thrombus was detected. The LAA emptying velocity was 70 cm/s.  4. Large pleural effusion in the left lateral region.  5. The mitral valve is myxomatous. Trivial mitral valve regurgitation. No evidence of mitral stenosis. There is mild late systolic prolapse of both leaflets of the mitral valve.  6. Tricuspid valve regurgitation is mild to moderate.  7. The aortic valve is tricuspid. There is mild calcification of the aortic valve. There is mild thickening of the aortic valve. Aortic valve regurgitation is mild. Aortic valve sclerosis/calcification is present, without any evidence of aortic stenosis.  8. There is Severe (Grade IV) plaque involving the aortic arch and descending aorta.  9. The inferior vena cava is normal in size with greater than 50% respiratory variability, suggesting right atrial pressure of 3 mmHg. Conclusion(s)/Recommendation(s): No evidence of vegetation/infective endocarditis on this transesophageael echocardiogram. Consider left thoracentesis as part of further evaluation for cause of bacteremia. FINDINGS  Left Ventricle: Left ventricular ejection fraction, by estimation, is 60 to 65%. The left ventricle has normal function. The left ventricle has no regional wall motion abnormalities. The left ventricular internal cavity size was normal in size. There is  no left ventricular hypertrophy. Right Ventricle: The right ventricular size is normal. No increase in  right ventricular wall thickness. Right ventricular systolic function is normal. Left Atrium: Left atrial size was normal in size. No left atrial/left atrial appendage thrombus was detected. The LAA emptying velocity was 70 cm/s. Right Atrium: Right atrial size was normal in size. Pericardium: There is no evidence of pericardial effusion. Mitral Valve: The mitral valve is myxomatous. There is mild late systolic prolapse of both leaflets of the mitral valve. Trivial mitral valve regurgitation. No evidence of mitral valve stenosis. Tricuspid Valve: The tricuspid valve is normal in structure. Tricuspid valve regurgitation is mild to moderate. No evidence of tricuspid stenosis. Aortic Valve: The noncoronary cusp is  thickened, calcified and restricted in motion. The aortic valve is tricuspid. There is mild calcification of the aortic valve. There is mild thickening of the aortic valve. Aortic valve regurgitation is mild. Aortic valve sclerosis/calcification is present, without any evidence of aortic stenosis. Pulmonic Valve: The pulmonic valve was normal in structure. Pulmonic valve regurgitation is not visualized. No evidence of pulmonic stenosis. Aorta: The aortic root is normal in size and structure. There is severe (Grade IV) plaque involving the aortic arch and descending aorta. Venous: The inferior vena cava is normal in size with greater than 50% respiratory variability, suggesting right atrial pressure of 3 mmHg. IAS/Shunts: No atrial level shunt detected by color flow Doppler. Additional Comments: There is a large pleural effusion in the left lateral region. Sanda Klein MD Electronically signed by Sanda Klein MD Signature Date/Time: 09/14/2021/3:37:22 PM    Final    [place] EKG SITE RITE  Result Date: 09/16/2021 If Site Rite image not attached, placement could not be confirmed due to current cardiac rhythm.  IR THORACENTESIS ASP PLEURAL SPACE W/IMG GUIDE  Result Date: 09/15/2021 INDICATION: Patient with  a history of lung cancer and atrial fibrillation presents today with a left pleural effusion. Interventional radiology asked to perform a diagnostic and therapeutic thoracentesis. EXAM: ULTRASOUND GUIDED THORACENTESIS MEDICATIONS: 1% lidocaine 10 mL COMPLICATIONS: None immediate. PROCEDURE: An ultrasound guided thoracentesis was thoroughly discussed with the patient and questions answered. The benefits, risks, alternatives and complications were also discussed. The patient understands and wishes to proceed with the procedure. Written consent was obtained. Ultrasound was performed to localize and mark an adequate pocket of fluid in the left chest. The area was then prepped and draped in the normal sterile fashion. 1% Lidocaine was used for local anesthesia. Under ultrasound guidance a 6 Fr Safe-T-Centesis catheter was introduced. Thoracentesis was performed. The procedure was stopped early due to patient discomfort. The catheter was removed and a dressing applied. FINDINGS: A total of approximately 800 mL of clear yellow fluid was removed. Samples were sent to the laboratory as requested by the clinical team. IMPRESSION: Successful ultrasound guided left thoracentesis yielding 800 mL of pleural fluid. Read by: Soyla Dryer, NP Electronically Signed   By: Jacqulynn Cadet M.D.   On: 09/15/2021 10:41        Scheduled Meds:  bisacodyl  10 mg Rectal Once   Chlorhexidine Gluconate Cloth  6 each Topical Daily   diltiazem  120 mg Oral Daily   docusate sodium  100 mg Oral Daily   enoxaparin (LOVENOX) injection  70 mg Subcutaneous Q12H   feeding supplement  237 mL Oral BID BM   folic acid  1 mg Oral Daily   mouth rinse  15 mL Mouth Rinse BID   metoprolol tartrate  25 mg Oral BID   multivitamin with minerals  1 tablet Oral Daily   mupirocin ointment  1 application Nasal BID   pantoprazole  40 mg Oral Daily   rosuvastatin  5 mg Oral Daily   sodium chloride  1 spray Each Nare Once   Continuous  Infusions:   ceFAZolin (ANCEF) IV 2 g (09/16/21 0552)     LOS: 6 days    Time spent: 30 minutes    Barb Merino, MD [facility] Pager [phone]

## 2021-09-16 NOTE — Progress Notes (Signed)
ANTICOAGULATION CONSULT NOTE - Follow Up Consult  Pharmacy Consult for Lovenox Indication:  DVT, PE, and afib  Allergies  Allergen Reactions   Diclofenac Sodium Other (See Comments)    Kidneys were affected    Patient Measurements: Height: 5\' 10"  (177.8 cm) Weight: 75.3 kg (166 lb 0.1 oz) IBW/kg (Calculated) : 73  Vital Signs: Temp: 98.2 F (36.8 C) (12/09 0739) Temp Source: Axillary (12/09 0739) BP: 128/78 (12/09 0739) Pulse Rate: 102 (12/09 0739)  Labs: Recent Labs    09/14/21 0200  HGB 10.0*  HCT 31.4*  PLT 176  CREATININE 0.77    Estimated Creatinine Clearance: 68.4 mL/min (by C-G formula based on SCr of 0.77 mg/dL).   Assessment:  Anticoag: enox 70 BID as PTA for hx PE/DVT and afib . Last Hgb 10 stable. Plts 176 WNL.  Goal of Therapy:  Anti-Xa level 0.6-1 units/ml 4hrs after LMWH dose given Monitor platelets by anticoagulation protocol: Yes   Plan:  Lovenox 70mg  /12 hrs CBC q72h on LMWH   Leon Lyons S. Alford Highland, PharmD, BCPS Clinical Staff Pharmacist [email or web address]  Alford Highland, Leven Hoel Stillinger 09/16/2021,8:01 AM

## 2021-09-16 NOTE — Progress Notes (Signed)
[facility]  Date of Admission:  09/09/2021   Total days of inpatient antibiotics 7  Principal Problem:   Symptomatic anemia Active Problems:   Non-small cell carcinoma of left lung, stage 3 (HCC)   Pleural effusion on left   Hyponatremia   Persistent atrial fibrillation (HCC)   Pulmonary embolism (HCC)   Protein-calorie malnutrition, severe (HCC)   Pleural effusion   Bacteremia   Malnutrition of moderate degree          Assessment:  88 YM with non-small cell lung cancer of LUL Ds in 2022 SP chemotherapy admitted for weakness/anemia. Blood Cx+ MRSA. CT showed aortic perforation along with hepatic metastasis and moderate left and right pleural effusion. Taken to OR for [facility]AR with vascular surgery.  Recent hospitalization: Pt had recently been diagnosed with afib and PE been taken to OR for volvulus.  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 Denies any prior Hx of of infectious process, denies hardware. Etiology of bacteremia is unclear, could be hospital acquired as he had a recent intervention for volvulus(lap procedure in October 2022 at [facility]).  -TTE showed mild thickening of the aortic valve. TEE showed no vegetation -PE with right ileofemoral DVT on anticoagulation -Pt had been complaining of left knee pain starting during admission, MRI left knee did not showed signs of infection Recommendations: -Continue cefazolin, anticipate 4 weeks of antibiotics from negative Cx (12/3-12/30) unless organism grows on pleural Cx which is not covered by cefazolin. 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 Gram  stain+ GNR.  - Suspect enlarging effusion in the setting of malignancy.  Recommendations -Antibiotics as above. He is clinically stable, afebrile on RA without overt respiratory symptoms as such would hold off on changing antibiotics.  -Follow Cx and cytology   #Deep pelvic abscess 2.6x4.0 cm(decreasing in size) -Per CT read pt has resolving abscess in the pelvis.  -Per wife at bedside one abscess was drained  a few weeks ago at [facility] with negative Cx. Pt has not been on antibiotics.    Microbiology:   Antibiotics: Azithromycin 12/2 Ceftriaxone 12/2 Cefazolin 12/3-p  Cultures: Blood 12/2 2/2 MSSA 12/3 NGTD Urine 12/2 NGTD    SUBJECTIVE: Pt is sitting in chair following PT. Both wife and pt are teary eyed discussing course of hospitalization this year.   Interval: Afebrile overnight.    Review of Systems: Review of Systems  All other systems reviewed and are negative.   Scheduled Meds:  bisacodyl  10 mg Rectal Once   Chlorhexidine Gluconate Cloth  6 each Topical Daily   diltiazem  120 mg Oral Daily   docusate sodium  100 mg Oral Daily   enoxaparin (LOVENOX) injection  70 mg Subcutaneous Q12H   feeding supplement  237 mL Oral BID BM   folic acid  1 mg Oral Daily   mouth rinse  15 mL Mouth Rinse BID   metoprolol tartrate  25 mg Oral BID   multivitamin with minerals  1 tablet Oral Daily   mupirocin ointment  1 application Nasal BID   pantoprazole  40 mg Oral Daily   rosuvastatin  5 mg  Oral Daily   sodium chloride  1 spray Each Nare Once   Continuous Infusions:   ceFAZolin (ANCEF) IV 2 g (09/16/21 0552)   PRN Meds:.acetaminophen **OR** acetaminophen, alum & mag hydroxide-simeth, bisacodyl, cyclobenzaprine, guaiFENesin-dextromethorphan, lidocaine, metoprolol tartrate, morphine injection, ondansetron **OR** ondansetron (ZOFRAN) IV, oxyCODONE-acetaminophen, phenol, polyethylene glycol, sodium chloride Allergies  Allergen Reactions   Diclofenac Sodium  Other (See Comments)    Kidneys were affected    OBJECTIVE: Vitals:   09/15/21 1948 09/15/21 2358 09/16/21 0452 09/16/21 0739  BP: 106/67 123/62 125/65 128/78  Pulse: 92 96 99 (!) 102  Resp: 16 15 18 17   Temp: (!) 97.4 F (36.3 C) 97.6 F (36.4 C) 98.1 F (36.7 C) 98.2 F (36.8 C)  TempSrc: Oral Oral Axillary Axillary  SpO2: 93% 95% 94% 94%  Weight:      Height:       Body mass index is 23.82 kg/m.  Physical Exam Constitutional:      General: He is not in acute distress.    Appearance: He is normal weight. He is not toxic-appearing.  HENT:     Head: Normocephalic and atraumatic.     Right Ear: External ear normal.     Left Ear: External ear normal.     Nose: No congestion or rhinorrhea.     Mouth/Throat:     Mouth: Mucous membranes are moist.     Pharynx: Oropharynx is clear.  Eyes:     Extraocular Movements: Extraocular movements intact.     Conjunctiva/sclera: Conjunctivae normal.     Pupils: Pupils are equal, round, and reactive to light.  Cardiovascular:     Rate and Rhythm: Normal rate and regular rhythm.     Heart sounds: No murmur heard.   No friction rub. No gallop.  Pulmonary:     Effort: Pulmonary effort is normal.     Breath sounds: Normal breath sounds.  Abdominal:     General: Abdomen is flat. Bowel sounds are normal.     Palpations: Abdomen is soft.  Musculoskeletal:        General: No swelling. Normal range of motion.     Cervical back: Normal range of motion and neck supple.  Skin:    General: Skin is warm and dry.  Neurological:     General: No focal deficit present.     Mental Status: He is oriented to person, place, and time.  Psychiatric:        Mood and Affect: Mood normal.      Lab Results Lab Results  Component Value Date   WBC 5.5 09/14/2021   HGB 10.0 (L) 09/14/2021   HCT 31.4 (L) 09/14/2021   MCV 94.9 09/14/2021   PLT 176 09/14/2021    Lab Results  Component Value Date   CREATININE 0.77 09/14/2021   BUN 12 09/14/2021    NA 134 (L) 09/14/2021   K 3.5 09/14/2021   CL 106 09/14/2021   CO2 20 (L) 09/14/2021    Lab Results  Component Value Date   ALT 13 09/09/2021   AST 14 (L) 09/09/2021   ALKPHOS 83 09/09/2021   BILITOT 0.3 09/09/2021        Laurice Record, [facility] 09/16/2021, 1:26 PM

## 2021-09-16 NOTE — PMR Pre-admission (Signed)
PMR Admission Coordinator Pre-Admission Assessment  Patient: Leon Lyons is an 87 y.o., male MRN: [id number] DOB: 1933/04/12 Height: 5\' 10"  (177.8 cm) Weight: 75.3 kg  Insurance Information HMO:     PPO:      PCP:      IPA:      80/20:      OTHER:  PRIMARY: Medicare A and B      Policy#: [id number]      Subscriber: pt CM Name:       Phone#:      Fax#:  Pre-Cert#:       Employer:  Benefits:  Phone #:      Name:  Eff. Date: A 02/07/00, B 04/09/03     Deduct: $1556      Out of Pocket Max: n/a      Life Max: n/a CIR: 100%      SNF: 20 full days Outpatient: 80%     Co-Ins: 20% Home Health: 100%      Co-Pay:  DME: 80%     Co-Ins: 20% Providers:  SECONDARY: BCBS/Federal      Policy#: [id number]     Phone#:   Financial Counselor:       Phone#:   The "Data Collection Information Summary" for patients in Inpatient Rehabilitation Facilities with attached "Privacy Act [place] Records" was provided and verbally reviewed with: Patient and Family  Emergency Contact Information Contact Information     Name Relation Home Work Mobile   Mcglothlin,April Spouse [phone]  [phone]       Current Medical History  Patient Admitting Diagnosis: debility   History of Present Illness: Pt is an 87 y/o male with only recently complex medical history of metastatic lung cancer (stage IIIb), afib, pulmonary embolism after abdominal surgery for vovulus, who presented to [facility] on 12/2 with DOE.  ED workup chest xray for L upper lobe infiltrate and L pleural effusion.  CT chest showed left perihilar postradiation changes with fibrosis and ground glass opacities, as well as R 5th rib metastatic lesion, right lower lobe PE with pulmonary infarction.  CT abdomen showed R iliofemoral DVT and contained aortic leak at hte aortic bifurcation with periaortic hematoma.  Hbg 7.4.  Pt transferred to [facility] for vascular intervention.  Pt received total of 3 units PRBCs throughout hospitalization and  hemoglobin remains stable.  GI recommended conservative management.  Aortic leak now s/p repair and vascular following.  Pt tolerating anticoagulation with lovenox, repeat CT scan in 4 weeks.  New afib with challenging rate control due to ongoing medical issues.  Currently on metoprolol 25 mg BID and cardizem 120 mg.  Therapy evaluations were completed and pt was recommended for CIR.     Patient's [facility] record from [facility] has been reviewed by the rehabilitation admission coordinator and physician.  Past Medical History  Past Medical History:  Diagnosis Date   History of radiation therapy 03/24/2021   left lung   02/11/2021-03/24/2021  Dr Gery Pray   Lung cancer [facility])    Tobacco use     Has the patient had major surgery during 100 days prior to admission? Yes  Family History   family history includes Hypertension in an other family member.  Current Medications  Current Facility-Administered Medications:    acetaminophen (TYLENOL) tablet 650 mg, 650 mg, Oral, Q6H PRN, 650 mg at 09/16/21 2142 **OR** acetaminophen (TYLENOL) suppository 650 mg, 650 mg, Rectal, Q6H PRN, Croitoru, Mihai, MD   alum & mag hydroxide-simeth (MAALOX/MYLANTA) 200-200-20  MG/5ML suspension 15-30 mL, 15-30 mL, Oral, Q2H PRN, Croitoru, Mihai, MD   bisacodyl (DULCOLAX) suppository 10 mg, 10 mg, Rectal, Daily PRN, Croitoru, Mihai, MD   bisacodyl (DULCOLAX) suppository 10 mg, 10 mg, Rectal, Once, Dorcas Carrow, MD   ceFAZolin (ANCEF) IVPB 2g/100 mL premix, 2 g, Intravenous, Q8H, Croitoru, Mihai, MD, Last Rate: 200 mL/hr at 09/17/21 0650, 2 g at 09/17/21 0650   cyclobenzaprine (FLEXERIL) tablet 5 mg, 5 mg, Oral, TID PRN, Croitoru, Mihai, MD, 5 mg at 09/14/21 0553   diltiazem (CARDIZEM) tablet 120 mg, 120 mg, Oral, Daily, Croitoru, Mihai, MD, 120 mg at 09/16/21 1046   docusate sodium (COLACE) capsule 100 mg, 100 mg, Oral, Daily, Croitoru, Mihai, MD, 100 mg at 09/16/21 1045   enoxaparin (LOVENOX) injection 70 mg, 70  mg, Subcutaneous, Q12H, Croitoru, Mihai, MD, 70 mg at 09/16/21 2142   feeding supplement (ENSURE ENLIVE / ENSURE PLUS) liquid 237 mL, 237 mL, Oral, BID BM, Croitoru, Mihai, MD, 237 mL at 09/16/21 1524   folic acid (FOLVITE) tablet 1 mg, 1 mg, Oral, Daily, Croitoru, Mihai, MD, 1 mg at 09/16/21 1046   guaiFENesin-dextromethorphan (ROBITUSSIN DM) 100-10 MG/5ML syrup 5 mL, 5 mL, Oral, Q6H PRN, Croitoru, Mihai, MD   lidocaine (XYLOCAINE) 1 % (with pres) injection, , , PRN, Covington, Jamie R, NP, 20 mL at 09/15/21 0930   MEDLINE mouth rinse, 15 mL, Mouth Rinse, BID, Ghimire, Kuber, MD, 15 mL at 09/16/21 2147   metoprolol tartrate (LOPRESSOR) injection 5 mg, 5 mg, Intravenous, Q4H PRN, Croitoru, Mihai, MD, 5 mg at 09/13/21 0920   metoprolol tartrate (LOPRESSOR) tablet 50 mg, 50 mg, Oral, BID, Ghimire, Kuber, MD   morphine 2 MG/ML injection 2 mg, 2 mg, Intravenous, Q2H PRN, Croitoru, Mihai, MD, 2 mg at 09/15/21 1313   multivitamin with minerals tablet 1 tablet, 1 tablet, Oral, Daily, Croitoru, Mihai, MD, 1 tablet at 09/16/21 1046   ondansetron (ZOFRAN) tablet 4 mg, 4 mg, Oral, Q6H PRN **OR** ondansetron (ZOFRAN) injection 4 mg, 4 mg, Intravenous, Q6H PRN, Croitoru, Mihai, MD, 4 mg at 09/13/21 8115   oxyCODONE-acetaminophen (PERCOCET/ROXICET) 5-325 MG per tablet 1-2 tablet, 1-2 tablet, Oral, Q4H PRN, Croitoru, Mihai, MD, 1 tablet at 09/17/21 0436   pantoprazole (PROTONIX) EC tablet 40 mg, 40 mg, Oral, Daily, Croitoru, Mihai, MD, 40 mg at 09/16/21 1046   phenol (CHLORASEPTIC) mouth spray 1 spray, 1 spray, Mouth/Throat, PRN, Croitoru, Mihai, MD   polyethylene glycol (MIRALAX / GLYCOLAX) packet 17 g, 17 g, Oral, Daily PRN, Croitoru, Mihai, MD, 17 g at 09/16/21 1046   rosuvastatin (CRESTOR) tablet 5 mg, 5 mg, Oral, Daily, Croitoru, Mihai, MD, 5 mg at 09/16/21 1046   sodium chloride (OCEAN) 0.65 % nasal spray 1 spray, 1 spray, Each Nare, Once, Croitoru, Mihai, MD   sodium chloride (OCEAN) 0.65 % nasal spray 1  spray, 1 spray, Each Nare, PRN, Croitoru, Mihai, MD  Patients Current Diet:  Diet Order             Diet - low sodium heart healthy           Diet regular Room service appropriate? Yes; Fluid consistency: Thin  Diet effective now                   Precautions / Restrictions Precautions Precautions: Fall Precaution Comments: watch HR and SpO2 Restrictions Weight Bearing Restrictions: No   Has the patient had 2 or more falls or a fall with injury in the past year? No  Prior Activity Level Community (5-7x/wk): up until October had been completely independent, now using RW for mobility and ADLs  Prior Functional Level Self Care: Did the patient need help bathing, dressing, using the toilet or eating? Independent  Indoor Mobility: Did the patient need assistance with walking from room to room (with or without device)? Independent  Stairs: Did the patient need assistance with internal or external stairs (with or without device)? Independent  Functional Cognition: Did the patient need help planning regular tasks such as shopping or remembering to take medications? Independent  Patient Information Are you of Hispanic, Latino/a,or Spanish origin?: A. No, not of Hispanic, Latino/a, or Spanish origin What is your race?: A. White Do you need or want an interpreter to communicate with a doctor or health care staff?: 0. No  Patient's Response To:  Health Literacy and Transportation Is the patient able to respond to health literacy and transportation needs?: Yes Health Literacy - How often do you need to have someone help you when you read instructions, pamphlets, or other written material from your doctor or pharmacy?: Never In the past 12 months, has lack of transportation kept you from medical appointments or from getting medications?: Yes In the past 12 months, has lack of transportation kept you from meetings, work, or from getting things needed for daily living?: Yes  Home  Assistive Devices / [place] Devices/Equipment: None Home Equipment: Conservation officer, nature (2 wheels), Rollator (4 wheels)  Prior Device Use: Indicate devices/aids used by the patient prior to current illness, exacerbation or injury? Walker  Current Functional Level Cognition  Overall Cognitive Status: Within Functional Limits for tasks assessed Orientation Level: Oriented X4 General Comments: Pt agreeable to therapy today without encouragement. Stated he knows he needs to do it.    Extremity Assessment (includes Sensation/Coordination)  Upper Extremity Assessment: LUE deficits/detail LUE Deficits / Details: mild shoulder pain with flexion, able to reach functionally across body to bedrails LUE: Shoulder pain with ROM  Lower Extremity Assessment: Defer to PT evaluation RLE Deficits / Details: AROM WFL, strength grossly 2+/5 hip flexion, 4-/5 knee extension LLE Deficits / Details: AAROM WFL, strength hip flexion 2-/5, knee extension 3-/5    ADLs  Overall ADL's : Needs assistance/impaired Eating/Feeding: Set up, Sitting Grooming: Set up, Sitting Upper Body Bathing: Minimal assistance, Sitting Upper Body Bathing Details (indicate cue type and reason): to bathe back sitting EOB Lower Body Bathing: Maximal assistance, Sitting/lateral leans Upper Body Dressing : Minimal assistance, Sitting Lower Body Dressing: Maximal assistance, Sitting/lateral leans Lower Body Dressing Details (indicate cue type and reason): attempted cross legs over each knee, pt. lifted up and said "thats it cant do it".  encouraged to try to cross leg over and he said "thats it".  pointed to wife and states "she does that for me".  but also reported he was able to perform LB dressing Toilet Transfer: +2 for safety/equipment, Minimal assistance (used stedy) Toilet Transfer Details (indicate cue type and reason): simulated with use of stedy from chair to bed 2 person assist for guiding pt. and equipement  safely Toileting- Clothing Manipulation and Hygiene: Maximal assistance, Sitting/lateral lean, Bed level General ADL Comments: limited progression as pt. for adls secondary to pt. stating he was unable to attempt lb adls.  utilized stedy equipment for transfer back to bed pt. reporting he is unable to stand without assistance noting LUE limited function along with LLE    Mobility  Overal bed mobility: Needs Assistance Bed Mobility: Sit to Supine Rolling:  Supervision Sidelying to sit: Mod assist, HOB elevated, Max assist Supine to sit: Mod assist, HOB elevated Sit to supine: Max assist, +2 for physical assistance Sit to sidelying: Mod assist General bed mobility comments: 2 person assistance to manage stedy equipment and guide pts. trunk and b les into bed    Transfers  Overall transfer level: Needs assistance Equipment used: Ambulation equipment used Transfers: Bed to chair/wheelchair/BSC, Sit to/from Stand Sit to Stand: +2 physical assistance, Mod assist Bed to/from chair/wheelchair/BSC transfer type:: Via Lift equipment  Lateral/Scoot Transfers: Max Barrister's clerk via Lift Equipment: Stedy General transfer comment: Heavy mod A +2 to stand from stedy onto eob; cues for upright posture and knee extension; able to stand 10-15 seconds. Mod A +2 for eccentric control lowering to eob from chair.    Ambulation / Gait / Stairs / Office manager / Balance Dynamic Sitting Balance Sitting balance - Comments: sat EOB about 10-15 minutes focus on balance, activity tolerance pursed lip breathing and discussing plans for follow up PT Balance Overall balance assessment: Needs assistance Sitting-balance support: Feet supported, Single extremity supported Sitting balance-Leahy Scale: Fair Sitting balance - Comments: sat EOB about 10-15 minutes focus on balance, activity tolerance pursed lip breathing and discussing plans for follow up PT Standing balance-Leahy Scale:  Zero Standing balance comment: Pt requrring +2 assist to stand in [place] with BUE support    Special needs/care consideration Continuous Drip IV  ancef 2g at 200 mL/hr   Previous Home Environment (from acute therapy documentation) Living Arrangements: Spouse/significant other Available Help at Discharge: Family Type of Home: House Home Layout: One level Home Access: Stairs to enter Technical brewer of Steps: 1 Home Care Services: No Additional Comments: reports his wife ordered a shower chair online recently  Discharge Living Setting Plans for Discharge Living Setting: Patient's home, Lives with (comment) (spouse) Type of Home at Discharge: House Discharge Home Layout: One level Discharge Home Access: Stairs to enter Entrance Stairs-Rails: None Entrance Stairs-Number of Steps: 1 Discharge Bathroom Shower/Tub: Tub/shower unit Discharge Bathroom Toilet: Standard Discharge Bathroom Accessibility: Yes How Accessible: Accessible via walker Does the patient have any problems obtaining your medications?: No  Social/Family/Support Systems Patient Roles: Spouse Anticipated Caregiver: spouse, April Anticipated Caregiver's Contact Information: [phone] Ability/Limitations of Caregiver: min assist Caregiver Availability: 24/7 Discharge Plan Discussed with Primary Caregiver: Yes Is Caregiver In Agreement with Plan?: Yes Does Caregiver/Family have Issues with Lodging/Transportation while Pt is in Rehab?: No  Goals Patient/Family Goal for Rehab: PT/OT supervision, SLP n/a Expected length of stay: 12-14 days Pt/Family Agrees to Admission and willing to participate: Yes Program Orientation Provided & Reviewed with Pt/Caregiver Including Roles  & Responsibilities: Yes  Decrease burden of Care through IP rehab admission: na  Possible need for SNF placement upon discharge: no  Patient Condition: I have reviewed medical records from [facility], spoken with CM, and patient and  spouse. I met with patient at the bedside for inpatient rehabilitation assessment.  Patient will benefit from ongoing PT and OT, can actively participate in 3 hours of therapy a day 5 days of the week, and can make measurable gains during the admission.  Patient will also benefit from the coordinated team approach during an Inpatient Acute Rehabilitation admission.  The patient will receive intensive therapy as well as Rehabilitation physician, nursing, social worker, and care management interventions.  Due to bladder management, bowel management, safety, skin/wound care, disease management, medication administration, pain management, and patient education the patient  requires 24 hour a day rehabilitation nursing.  The patient is currently mod +2 with mobility and basic ADLs.  Discharge setting and therapy post discharge at home with home health is anticipated.  Patient has agreed to participate in the Acute Inpatient Rehabilitation Program and will admit today.  Preadmission Screen Completed By: Shann Medal, PT, DPT with updates by Genella Mech, 09/17/2021 9:08 AM ______________________________________________________________________   Discussed status with Dr. Dagoberto Ligas  on 09/17/21  at 845 and received approval for admission today.  Admission Coordinator:  Genella Mech, CCC-SLP, time 900/Date 09/17/21 with day of updates by Clemens Catholic, MS, CCC-SLP   Assessment/Plan: Diagnosis: Does the need for close, 24 hr/day Medical supervision in concert with the patient's rehab needs make it unreasonable for this patient to be served in a less intensive setting? Yes Co-Morbidities requiring supervision/potential complications: Stage IV lung CA- with mets ot liver; bone/rib; Afib; PE; MSSA bacteremia- PICC line; periaortic hematoma s/p contained aortic leak/s/p repair; Weakness;debility Due to bladder management, bowel management, safety, skin/wound care, disease management, medication administration, pain  management, and patient education, does the patient require 24 hr/day rehab nursing? Yes Does the patient require coordinated care of a physician, rehab nurse, PT, OT, and SLP to address physical and functional deficits in the context of the above medical diagnosis(es)? Yes Addressing deficits in the following areas: balance, endurance, locomotion, strength, transferring, bowel/bladder control, bathing, dressing, feeding, grooming, and toileting Can the patient actively participate in an intensive therapy program of at least 3 hrs of therapy 5 days a week? Yes The potential for patient to make measurable gains while on inpatient rehab is fair Anticipated functional outcomes upon discharge from inpatient rehab: supervision PT, supervision OT, supervision SLP Estimated rehab length of stay to reach the above functional goals is: 14-16 days Anticipated discharge destination: Home 10. Overall Rehab/Functional Prognosis: fair   MD Signature:

## 2021-09-16 NOTE — Progress Notes (Signed)
Initial Nutrition Assessment  DOCUMENTATION CODES:   Non-severe (moderate) malnutrition in context of chronic illness  INTERVENTION:  - Encourage PO intake  - Magic cup TID with meals, each supplement provides 290 kcal and 9 grams of protein  - Continue Ensure Enlive po BID, each supplement provides 350 kcal and 20 grams of protein  - Continue MVI daily  NUTRITION DIAGNOSIS:   Moderate Malnutrition related to chronic illness (lung cancer, volvulus) as evidenced by moderate muscle depletion, moderate fat depletion.  GOAL:   Patient will meet greater than or equal to 90% of their needs  Addressing with PO intake of meals and nutrition supplements  MONITOR:   PO intake, Supplement acceptance, Labs, Weight trends  REASON FOR ASSESSMENT:   Consult Assessment of nutrition requirement/status  ASSESSMENT:   Pt admitted from home with generalized weakness secondary to symptomatic anemia and left pleural effusion secondary to bacteremia. PMH includes lung cancer diagnosed April 2022, recent diagnosis of afib and PE after surgical intervention for volvulus.  12/4: per vascular surgery- pt with contained leak at aortic bifurcation; pt at risk for progression and rupture; percutaneous endovascular aneurysm repair 12/8: L thoracentesis-yield 815mL fluid ; pending lab and cytology analysis  Per review of chart, noted new hepatic metastasis. Noted pt to follow-up with outpatient oncology after d/c. Pending GOC discussion.  Spoke with pt and wife at bedside. Pt has had poor intake of food d/t poor appetite and altered taste but understands the importance of adequate nutrition for strength and healing. [name] expressed concern over decrease in albumin. Spoke with them about how this is not necessarily nutrition related and may be related to inflammation and acute illness.  Pt reports using Prostat at home for increased protein needs however, spoke with pt about the benefits of drinking  Ensure over Prostat. He is agreeable to drinking 2 Ensure a day in addition to receiving Magic cups to help improve nutritional intake. Also offered to order snacks through the day but pt would like to continue with meals and addition of supplements for now.  Medications: colace, folvite, MVI, protonix, IV abx  Labs: reviewed-last updated 12/7  NUTRITION - FOCUSED PHYSICAL EXAM:  Flowsheet Row Most Recent Value  Orbital Region Moderate depletion  Upper Arm Region Moderate depletion  Thoracic and Lumbar Region Moderate depletion  Buccal Region Moderate depletion  Temple Region Severe depletion  Clavicle Bone Region Moderate depletion  Clavicle and Acromion Bone Region Moderate depletion  Scapular Bone Region Moderate depletion  Dorsal Hand Severe depletion  Patellar Region Moderate depletion  Anterior Thigh Region Moderate depletion  Posterior Calf Region Moderate depletion  Edema (RD Assessment) Mild  Hair Reviewed  Eyes Reviewed  Mouth Other (Comment)  [wears upper dentures]  Skin Reviewed  Nails Reviewed       Diet Order:   Diet Order             Diet regular Room service appropriate? Yes; Fluid consistency: Thin  Diet effective now                   EDUCATION NEEDS:   Education needs have been addressed  Skin:  Skin Assessment: Skin Integrity Issues: Skin Integrity Issues:: Incisions Incisions: lower medial abdomen; L groin; R groin  Last BM:  09/10/21  Height:   Ht Readings from Last 1 Encounters:  09/14/21 5\' 10"  (1.778 m)    Weight:   Wt Readings from Last 1 Encounters:  09/14/21 75.3 kg    BMI:  Body mass index is 23.82 kg/m.  Estimated Nutritional Needs:   Kcal:  1900-2100  Protein:  95-105g  Fluid:  >/=1.9L  Clayborne Dana, RDN, LDN Clinical Nutrition

## 2021-09-17 ENCOUNTER — Inpatient Hospital Stay (HOSPITAL_COMMUNITY): Payer: Medicare Other

## 2021-09-17 ENCOUNTER — Inpatient Hospital Stay (HOSPITAL_COMMUNITY)
Admission: EM | Admit: 2021-09-17 | Discharge: 2021-10-11 | DRG: 945 | Disposition: A | Discharge status: Home Health | Payer: Medicare Other | Source: Intra-hospital | Attending: Physical Medicine & Rehabilitation | Admitting: Physical Medicine & Rehabilitation

## 2021-09-17 DIAGNOSIS — M545 Low back pain, unspecified: Secondary | ICD-10-CM | POA: Diagnosis present

## 2021-09-17 DIAGNOSIS — Z79899 Other long term (current) drug therapy: Secondary | ICD-10-CM | POA: Diagnosis not present

## 2021-09-17 DIAGNOSIS — C3492 Malignant neoplasm of unspecified part of left bronchus or lung: Secondary | ICD-10-CM

## 2021-09-17 DIAGNOSIS — I2510 Atherosclerotic heart disease of native coronary artery without angina pectoris: Secondary | ICD-10-CM | POA: Diagnosis present

## 2021-09-17 DIAGNOSIS — G8929 Other chronic pain: Secondary | ICD-10-CM | POA: Diagnosis not present

## 2021-09-17 DIAGNOSIS — C61 Malignant neoplasm of prostate: Secondary | ICD-10-CM | POA: Diagnosis not present

## 2021-09-17 DIAGNOSIS — J90 Pleural effusion, not elsewhere classified: Secondary | ICD-10-CM

## 2021-09-17 DIAGNOSIS — Z86711 Personal history of pulmonary embolism: Secondary | ICD-10-CM | POA: Diagnosis not present

## 2021-09-17 DIAGNOSIS — R0902 Hypoxemia: Secondary | ICD-10-CM

## 2021-09-17 DIAGNOSIS — Z9221 Personal history of antineoplastic chemotherapy: Secondary | ICD-10-CM

## 2021-09-17 DIAGNOSIS — R0602 Shortness of breath: Secondary | ICD-10-CM

## 2021-09-17 DIAGNOSIS — B9561 Methicillin susceptible Staphylococcus aureus infection as the cause of diseases classified elsewhere: Secondary | ICD-10-CM | POA: Diagnosis present

## 2021-09-17 DIAGNOSIS — Z515 Encounter for palliative care: Secondary | ICD-10-CM | POA: Diagnosis not present

## 2021-09-17 DIAGNOSIS — R5381 Other malaise: Principal | ICD-10-CM | POA: Diagnosis present

## 2021-09-17 DIAGNOSIS — J9601 Acute respiratory failure with hypoxia: Secondary | ICD-10-CM | POA: Diagnosis not present

## 2021-09-17 DIAGNOSIS — M25512 Pain in left shoulder: Secondary | ICD-10-CM | POA: Diagnosis present

## 2021-09-17 DIAGNOSIS — Z87891 Personal history of nicotine dependence: Secondary | ICD-10-CM

## 2021-09-17 DIAGNOSIS — E440 Moderate protein-calorie malnutrition: Secondary | ICD-10-CM | POA: Diagnosis present

## 2021-09-17 DIAGNOSIS — C787 Secondary malignant neoplasm of liver and intrahepatic bile duct: Secondary | ICD-10-CM | POA: Diagnosis present

## 2021-09-17 DIAGNOSIS — R269 Unspecified abnormalities of gait and mobility: Secondary | ICD-10-CM | POA: Diagnosis present

## 2021-09-17 DIAGNOSIS — C3412 Malignant neoplasm of upper lobe, left bronchus or lung: Secondary | ICD-10-CM | POA: Diagnosis present

## 2021-09-17 DIAGNOSIS — D649 Anemia, unspecified: Secondary | ICD-10-CM | POA: Diagnosis present

## 2021-09-17 DIAGNOSIS — I4819 Other persistent atrial fibrillation: Secondary | ICD-10-CM | POA: Diagnosis present

## 2021-09-17 DIAGNOSIS — C3490 Malignant neoplasm of unspecified part of unspecified bronchus or lung: Secondary | ICD-10-CM | POA: Diagnosis not present

## 2021-09-17 DIAGNOSIS — J910 Malignant pleural effusion: Secondary | ICD-10-CM | POA: Diagnosis present

## 2021-09-17 DIAGNOSIS — Z6825 Body mass index (BMI) 25.0-25.9, adult: Secondary | ICD-10-CM

## 2021-09-17 DIAGNOSIS — R339 Retention of urine, unspecified: Secondary | ICD-10-CM | POA: Diagnosis present

## 2021-09-17 DIAGNOSIS — M533 Sacrococcygeal disorders, not elsewhere classified: Secondary | ICD-10-CM

## 2021-09-17 DIAGNOSIS — N390 Urinary tract infection, site not specified: Secondary | ICD-10-CM | POA: Diagnosis present

## 2021-09-17 DIAGNOSIS — E876 Hypokalemia: Secondary | ICD-10-CM | POA: Diagnosis present

## 2021-09-17 DIAGNOSIS — Z8679 Personal history of other diseases of the circulatory system: Secondary | ICD-10-CM

## 2021-09-17 DIAGNOSIS — Z7189 Other specified counseling: Secondary | ICD-10-CM | POA: Diagnosis not present

## 2021-09-17 DIAGNOSIS — Z66 Do not resuscitate: Secondary | ICD-10-CM | POA: Diagnosis present

## 2021-09-17 DIAGNOSIS — C7951 Secondary malignant neoplasm of bone: Secondary | ICD-10-CM | POA: Diagnosis present

## 2021-09-17 DIAGNOSIS — K5900 Constipation, unspecified: Secondary | ICD-10-CM | POA: Diagnosis present

## 2021-09-17 DIAGNOSIS — Z9889 Other specified postprocedural states: Secondary | ICD-10-CM

## 2021-09-17 DIAGNOSIS — Z86718 Personal history of other venous thrombosis and embolism: Secondary | ICD-10-CM | POA: Diagnosis not present

## 2021-09-17 DIAGNOSIS — R7881 Bacteremia: Secondary | ICD-10-CM | POA: Diagnosis present

## 2021-09-17 DIAGNOSIS — Z923 Personal history of irradiation: Secondary | ICD-10-CM

## 2021-09-17 DIAGNOSIS — R600 Localized edema: Secondary | ICD-10-CM | POA: Diagnosis not present

## 2021-09-17 HISTORY — DX: Other pulmonary embolism without acute cor pulmonale: I26.99

## 2021-09-17 HISTORY — DX: Other persistent atrial fibrillation: I48.19

## 2021-09-17 HISTORY — DX: Aortic aneurysm of unspecified site, ruptured: I71.8

## 2021-09-17 HISTORY — DX: Pleural effusion, not elsewhere classified: J90

## 2021-09-17 HISTORY — DX: Methicillin susceptible Staphylococcus aureus infection as the cause of diseases classified elsewhere: B95.61

## 2021-09-17 HISTORY — DX: Acute embolism and thrombosis of unspecified deep veins of right lower extremity: I82.401

## 2021-09-17 HISTORY — DX: Personal history of other medical treatment: Z92.89

## 2021-09-17 HISTORY — DX: Bacteremia: R78.81

## 2021-09-17 LAB — CBC
HCT: 33 % — ABNORMAL LOW (ref 39.0–52.0)
Hemoglobin: 10.5 g/dL — ABNORMAL LOW (ref 13.0–17.0)
MCH: 30.2 pg (ref 26.0–34.0)
MCHC: 31.8 g/dL (ref 30.0–36.0)
MCV: 94.8 fL (ref 80.0–100.0)
Platelets: 208 10*3/uL (ref 150–400)
RBC: 3.48 MIL/uL — ABNORMAL LOW (ref 4.22–5.81)
RDW: 17.2 % — ABNORMAL HIGH (ref 11.5–15.5)
WBC: 7.7 10*3/uL (ref 4.0–10.5)
nRBC: 0 % (ref 0.0–0.2)

## 2021-09-17 MED ORDER — SODIUM CHLORIDE 0.9% FLUSH
10.0000 mL | Freq: Two times a day (BID) | INTRAVENOUS | Status: DC
  Administered 2021-09-17 – 2021-09-30 (×25): 10 mL
  Administered 2021-10-01: 21:00:00 20 mL
  Administered 2021-10-01 – 2021-10-07 (×9): 10 mL
  Administered 2021-10-07 – 2021-10-08 (×2): 20 mL
  Administered 2021-10-08 – 2021-10-10 (×4): 10 mL

## 2021-09-17 MED ORDER — DOCUSATE SODIUM 100 MG PO CAPS
100.0000 mg | ORAL_CAPSULE | Freq: Every day | ORAL | Status: DC
  Administered 2021-09-18 – 2021-09-22 (×5): 100 mg via ORAL
  Filled 2021-09-17 (×5): qty 1

## 2021-09-17 MED ORDER — FOLIC ACID 1 MG PO TABS: 1.0000 mg | ORAL_TABLET | Freq: Every day | ORAL | Status: DC

## 2021-09-17 MED ORDER — METOPROLOL TARTRATE 50 MG PO TABS: 50.0000 mg | ORAL_TABLET | Freq: Two times a day (BID) | ORAL | Status: DC

## 2021-09-17 MED ORDER — BISACODYL 10 MG RE SUPP
10.0000 mg | Freq: Every day | RECTAL | Status: DC | PRN
  Administered 2021-09-22 – 2021-10-09 (×2): 10 mg via RECTAL
  Filled 2021-09-17 (×2): qty 1

## 2021-09-17 MED ORDER — GUAIFENESIN-DM 100-10 MG/5ML PO SYRP
5.0000 mL | ORAL_SOLUTION | Freq: Four times a day (QID) | ORAL | 0 refills | Status: DC | PRN
Start: 2021-09-17 — End: 2021-10-11

## 2021-09-17 MED ORDER — DILTIAZEM HCL 60 MG PO TABS
120.0000 mg | ORAL_TABLET | Freq: Every day | ORAL | Status: DC
  Administered 2021-09-18 – 2021-10-11 (×24): 120 mg via ORAL
  Filled 2021-09-17 (×24): qty 2

## 2021-09-17 MED ORDER — SODIUM CHLORIDE 0.9% FLUSH
10.0000 mL | Freq: Two times a day (BID) | INTRAVENOUS | Status: DC
  Administered 2021-09-17: 10 mL

## 2021-09-17 MED ORDER — CYCLOBENZAPRINE HCL 5 MG PO TABS: 5.0000 mg | ORAL_TABLET | Freq: Three times a day (TID) | ORAL | Status: DC | PRN

## 2021-09-17 MED ORDER — CEFAZOLIN IV (FOR PTA / DISCHARGE USE ONLY): 2.0000 g | Freq: Three times a day (TID) | INTRAVENOUS | 0 refills | Status: DC

## 2021-09-17 MED ORDER — ALUM & MAG HYDROXIDE-SIMETH 200-200-20 MG/5ML PO SUSP: 15.0000 mL | ORAL | 0 refills | Status: AC | PRN

## 2021-09-17 MED ORDER — ROSUVASTATIN CALCIUM 5 MG PO TABS: 5.0000 mg | ORAL_TABLET | Freq: Every day | ORAL | Status: DC

## 2021-09-17 MED ORDER — ADULT MULTIVITAMIN W/MINERALS CH
1.0000 | ORAL_TABLET | Freq: Every day | ORAL | Status: DC
  Administered 2021-09-18 – 2021-10-10 (×23): 1 via ORAL
  Filled 2021-09-17 (×23): qty 1

## 2021-09-17 MED ORDER — GUAIFENESIN-DM 100-10 MG/5ML PO SYRP
5.0000 mL | ORAL_SOLUTION | Freq: Four times a day (QID) | ORAL | Status: DC | PRN
  Administered 2021-09-30: 15:00:00 5 mL via ORAL
  Filled 2021-09-17: qty 5

## 2021-09-17 MED ORDER — ALUM & MAG HYDROXIDE-SIMETH 200-200-20 MG/5ML PO SUSP: 15.0000 mL | ORAL | Status: DC | PRN

## 2021-09-17 MED ORDER — ONDANSETRON HCL 4 MG/2ML IJ SOLN: 4.0000 mg | Freq: Four times a day (QID) | INTRAMUSCULAR | Status: DC | PRN

## 2021-09-17 MED ORDER — ENOXAPARIN SODIUM 80 MG/0.8ML IJ SOSY
70.0000 mg | PREFILLED_SYRINGE | Freq: Two times a day (BID) | INTRAMUSCULAR | Status: DC
  Administered 2021-09-17 – 2021-09-20 (×7): 70 mg via SUBCUTANEOUS
  Filled 2021-09-17 (×7): qty 0.8

## 2021-09-17 MED ORDER — SALINE SPRAY 0.65 % NA SOLN
1.0000 | NASAL | Status: DC | PRN
  Filled 2021-09-17: qty 44

## 2021-09-17 MED ORDER — CHLORHEXIDINE GLUCONATE CLOTH 2 % EX PADS: 6.0000 | MEDICATED_PAD | Freq: Every day | CUTANEOUS | Status: DC

## 2021-09-17 MED ORDER — ONDANSETRON HCL 4 MG PO TABS
4.0000 mg | ORAL_TABLET | Freq: Four times a day (QID) | ORAL | Status: DC | PRN
  Administered 2021-09-18 – 2021-10-04 (×3): 4 mg via ORAL
  Filled 2021-09-17 (×3): qty 1

## 2021-09-17 MED ORDER — CHLORHEXIDINE GLUCONATE CLOTH 2 % EX PADS
6.0000 | MEDICATED_PAD | Freq: Every day | CUTANEOUS | Status: DC
Start: 2021-09-17 — End: 2021-10-11

## 2021-09-17 MED ORDER — ACETAMINOPHEN 325 MG PO TABS: 650.0000 mg | ORAL_TABLET | Freq: Four times a day (QID) | ORAL | Status: DC | PRN

## 2021-09-17 MED ORDER — PHENOL 1.4 % MT LIQD
1.0000 | OROMUCOSAL | Status: DC | PRN
  Filled 2021-09-17: qty 177

## 2021-09-17 MED ORDER — OXYCODONE-ACETAMINOPHEN 5-325 MG PO TABS: 1.0000 | ORAL_TABLET | ORAL | Status: DC | PRN

## 2021-09-17 MED ORDER — PANTOPRAZOLE SODIUM 40 MG PO TBEC: 40.0000 mg | DELAYED_RELEASE_TABLET | Freq: Every day | ORAL | Status: AC

## 2021-09-17 MED ORDER — ROSUVASTATIN CALCIUM 5 MG PO TABS
5.0000 mg | ORAL_TABLET | Freq: Every day | ORAL | Status: DC
  Administered 2021-09-18 – 2021-10-11 (×24): 5 mg via ORAL
  Filled 2021-09-17 (×24): qty 1

## 2021-09-17 MED ORDER — POLYETHYLENE GLYCOL 3350 17 G PO PACK
17.0000 g | PACK | Freq: Every day | ORAL | Status: DC | PRN
  Administered 2021-09-21 – 2021-10-05 (×6): 17 g via ORAL
  Filled 2021-09-17 (×7): qty 1

## 2021-09-17 MED ORDER — ACETAMINOPHEN 650 MG RE SUPP: 650.0000 mg | Freq: Four times a day (QID) | RECTAL | Status: DC | PRN

## 2021-09-17 MED ORDER — ENSURE ENLIVE PO LIQD
237.0000 mL | Freq: Two times a day (BID) | ORAL | Status: DC
  Administered 2021-09-18 – 2021-10-11 (×45): 237 mL via ORAL

## 2021-09-17 MED ORDER — BISACODYL 10 MG RE SUPP: 10.0000 mg | Freq: Every day | RECTAL | 0 refills | Status: AC | PRN

## 2021-09-17 MED ORDER — ACETAMINOPHEN 650 MG RE SUPP
650.0000 mg | Freq: Four times a day (QID) | RECTAL | Status: DC | PRN
  Filled 2021-09-17: qty 1

## 2021-09-17 MED ORDER — POLYETHYLENE GLYCOL 3350 17 G PO PACK: 17.0000 g | PACK | Freq: Every day | ORAL | 0 refills | Status: AC

## 2021-09-17 MED ORDER — OXYCODONE-ACETAMINOPHEN 5-325 MG PO TABS
1.0000 | ORAL_TABLET | ORAL | Status: DC | PRN
Start: 2021-09-17 — End: 2021-10-10
  Administered 2021-09-17 – 2021-10-03 (×18): 1 via ORAL
  Administered 2021-10-04 – 2021-10-10 (×2): 2 via ORAL
  Filled 2021-09-17 (×2): qty 1
  Filled 2021-09-17: qty 2
  Filled 2021-09-17 (×2): qty 1
  Filled 2021-09-17: qty 2
  Filled 2021-09-17: qty 1
  Filled 2021-09-17: qty 2
  Filled 2021-09-17 (×4): qty 1
  Filled 2021-09-17: qty 2
  Filled 2021-09-17 (×2): qty 1
  Filled 2021-09-17: qty 2
  Filled 2021-09-17 (×6): qty 1
  Filled 2021-09-17: qty 2

## 2021-09-17 MED ORDER — ACETAMINOPHEN 325 MG PO TABS
650.0000 mg | ORAL_TABLET | Freq: Four times a day (QID) | ORAL | Status: DC | PRN
  Administered 2021-09-18 – 2021-10-10 (×14): 650 mg via ORAL
  Filled 2021-09-17 (×13): qty 2

## 2021-09-17 MED ORDER — ORAL CARE MOUTH RINSE
15.0000 mL | Freq: Two times a day (BID) | OROMUCOSAL | Status: DC
  Administered 2021-09-18 – 2021-10-11 (×40): 15 mL via OROMUCOSAL

## 2021-09-17 MED ORDER — OXYCODONE-ACETAMINOPHEN 5-325 MG PO TABS: 1.0000 | ORAL_TABLET | ORAL | 0 refills | Status: DC | PRN

## 2021-09-17 MED ORDER — FOLIC ACID 1 MG PO TABS
1.0000 mg | ORAL_TABLET | Freq: Every day | ORAL | Status: DC
  Administered 2021-09-18 – 2021-10-10 (×23): 1 mg via ORAL
  Filled 2021-09-17 (×23): qty 1

## 2021-09-17 MED ORDER — METOPROLOL TARTRATE 50 MG PO TABS
50.0000 mg | ORAL_TABLET | Freq: Two times a day (BID) | ORAL | Status: DC
  Administered 2021-09-17: 50 mg via ORAL
  Filled 2021-09-17: qty 1

## 2021-09-17 MED ORDER — CEFAZOLIN SODIUM-DEXTROSE 2-4 GM/100ML-% IV SOLN
2.0000 g | Freq: Three times a day (TID) | INTRAVENOUS | Status: AC
  Administered 2021-09-17 – 2021-10-08 (×63): 2 g via INTRAVENOUS
  Filled 2021-09-17 (×65): qty 100

## 2021-09-17 MED ORDER — CYCLOBENZAPRINE HCL 5 MG PO TABS
5.0000 mg | ORAL_TABLET | Freq: Three times a day (TID) | ORAL | 0 refills | Status: DC | PRN
Start: 2021-09-17 — End: 2021-10-11

## 2021-09-17 MED ORDER — CHLORHEXIDINE GLUCONATE CLOTH 2 % EX PADS
6.0000 | MEDICATED_PAD | Freq: Every day | CUTANEOUS | Status: DC
  Administered 2021-09-18 – 2021-10-01 (×14): 6 via TOPICAL

## 2021-09-17 MED ORDER — METOPROLOL TARTRATE 50 MG PO TABS
50.0000 mg | ORAL_TABLET | Freq: Two times a day (BID) | ORAL | Status: DC
  Administered 2021-09-17 – 2021-10-11 (×48): 50 mg via ORAL
  Filled 2021-09-17 (×48): qty 1

## 2021-09-17 MED ORDER — SODIUM CHLORIDE 0.9% FLUSH: 10.0000 mL | INTRAVENOUS | Status: DC | PRN

## 2021-09-17 MED ORDER — PANTOPRAZOLE SODIUM 40 MG PO TBEC
40.0000 mg | DELAYED_RELEASE_TABLET | Freq: Every day | ORAL | Status: DC
  Administered 2021-09-18 – 2021-10-11 (×24): 40 mg via ORAL
  Filled 2021-09-17 (×24): qty 1

## 2021-09-17 MED ORDER — DOCUSATE SODIUM 100 MG PO CAPS: 100.0000 mg | ORAL_CAPSULE | Freq: Every day | ORAL | 0 refills | Status: DC

## 2021-09-17 MED ORDER — SODIUM CHLORIDE 0.9% FLUSH
10.0000 mL | INTRAVENOUS | Status: DC | PRN
  Administered 2021-09-27 – 2021-10-10 (×3): 10 mL

## 2021-09-17 MED ORDER — ONDANSETRON HCL 4 MG PO TABS: 4.0000 mg | ORAL_TABLET | Freq: Four times a day (QID) | ORAL | Status: DC | PRN

## 2021-09-17 MED ORDER — ENSURE ENLIVE PO LIQD: 237.0000 mL | Freq: Two times a day (BID) | ORAL | 12 refills | Status: AC

## 2021-09-17 NOTE — Care Management (Signed)
09-17-21 1040 Case Manager received a consult to arrange outpatient palliative services in [facility]. The patient will be discharged to [place] is agreeable to have the rehab staff to arrange outpatient palliative once ready to transition home from CIR. No further needs from Case Manager at this time.

## 2021-09-17 NOTE — Progress Notes (Signed)
Report given to 5 C.  Ready for transfer to CIR

## 2021-09-17 NOTE — Progress Notes (Signed)
Inpatient Rehab Admissions Coordinator:   I have a bed for this Pt. On CIR today (following PICC line placement). RN may call report to [phone].  Clemens Catholic, [place], [place] Admissions Coordinator  [phone] ([place]) [phone] (office)

## 2021-09-17 NOTE — Discharge Summary (Signed)
Physician Discharge Summary  Leon Lyons HNG:[id number] DOB: [birth year] DOA: 09/09/2021  PCP: [name], MD  Admit date: 09/09/2021 Discharge date: 09/17/2021  Admitted From: Home Disposition: CIR  Recommendations for Outpatient Follow-up:  Follow up with PCP in 1-2 weeks Please obtain BMP/CBC/magnesium/phosphorus in one week Schedule follow-up with oncology on discharge. Consult palliative care to continue to follow-up at rehab and on discharge to home.  Home Health: N/A Equipment/Devices: N/A  Discharge Condition: Fair CODE STATUS: DNR Diet recommendation: Regular diet  Discharge summary: 87 year old gentleman with history of metastatic lung cancer stage IIIb, recent diagnosis of A. fib and pulmonary embolism after an abdominal surgery for volvulus presented with generalized weakness and shortness of breath on exertion.  In the emergency room, chest x-ray with left upper lobe infiltrate and left pleural effusion.  CT scan of the chest with left perihilar postradiation changes and fibrosis groundglass opacities, right fifth rib metastatic lesion, right lower lobe pulmonary embolus with pulmonary infarction.  CT scan of the abdomen with right iliofemoral DVT.  Contained aortic leak at the aortic bifurcation with periaortic hematoma.  Hemoglobin 7.4. Remains in the hospital with MSSA bacteremia, vascular intervention.     Assessment & plan of care:   Acute symptomatic anemia due to surgical colonic anastomosis bleeding: Acute on chronic anemia.  Anemia of chronic disease including metastatic lung cancer. -Received total 3 units of PRBC transfusion.  Hemoglobin appropriately responded and remains stable. GI recommended conservative management.  No evidence of active bleeding now. Recheck in 1 week.   Aortic leak now status post endovascular repair: Vascular following.  Currently tolerating anticoagulation with Lovenox.  Vascular surgery plans to recheck CT scan in 4 weeks.    A. fib with RVR: Previously controlled A. fib.  Now rate control is challenging due to ongoing  medical issues.  Will increase dose of metoprolol to 75 mg twice a day with holding parameters.  Hold if blood pressures less than 90.  Already on Cardizem 120 mg daily.  Therapeutic on Lovenox.   MSSA bacteremia: Initial blood cultures 12/2 positive for MSSA.  Repeat blood cultures negative.  TTE without evidence of vegetation. TEE with no evidence of vegetation.  Followed by ID.  Remains on cefazolin antibiotic therapy. Left knee MRI with no evidence of septic arthritis. Left thoracentesis with no evidence of infection.  Cultures pending. Cefazolin for 4 weeks, PICC line today and transfer to rehab.   Stage IIIb non-small cell lung cancer/pulmonary embolism/right iliofemoral DVT: Proximal DVT and pulmonary embolism in a patient with metastatic lung cancer usually associated with poor prognosis. Seen by oncology in the hospital, they plan to follow-up outpatient once his infection is improved. Will keep patient on Lovenox, currently tolerating. Frail debilitated and deconditioned.   Continue ongoing palliative discussion. Adequate pain medications with aggressive bowel regimen.  Chronically ill.  Medically stable.  May need ongoing follow-up at rehab.  Stable for discharge to CIR level of care with ongoing close medical supervision and treatment as well as rehab needs.    Discharge Diagnoses:  Principal Problem:   Symptomatic anemia Active Problems:   Non-small cell carcinoma of left lung, stage 3 (HCC)   Pleural effusion on left   Hyponatremia   Persistent atrial fibrillation (HCC)   Pulmonary embolism (HCC)   Protein-calorie malnutrition, severe (HCC)   Pleural effusion   Bacteremia   Malnutrition of moderate degree    Discharge Instructions  Discharge Instructions     [facility] pharmacist to adjust dose  for Vancomycin, Aminoglycosides and other anti-infective  therapies as requested by physician.   Complete by: As directed    [facility] to provide Cath Flo 2mg    Complete by: As directed    Administer for PICC line occlusion and as ordered by physician for other access device issues.   Anaphylaxis Kit: Provided to treat any anaphylactic reaction to the medication being provided to the patient if First Dose or when requested by physician   Complete by: As directed    Epinephrine 1mg /ml vial / amp: Administer 0.3mg  (0.71ml) subcutaneously once for moderate to severe anaphylaxis, nurse to call physician and pharmacy when reaction occurs and call 911 if needed for immediate care   Diphenhydramine 50mg /ml IV vial: Administer 25-50mg  IV/IM PRN for first dose reaction, rash, itching, mild reaction, nurse to call physician and pharmacy when reaction occurs   Sodium Chloride 0.9% NS 568ml IV: Administer if needed for hypovolemic blood pressure drop or as ordered by physician after call to physician with anaphylactic reaction   Call MD for:  difficulty breathing, headache or visual disturbances   Complete by: As directed    Call MD for:  severe uncontrolled pain   Complete by: As directed    Change dressing on IV access line weekly and PRN   Complete by: As directed    Diet - low sodium heart healthy   Complete by: As directed    Discharge instructions   Complete by: As directed    PICC line and IV antibiotics as above  Re engage palliative care team to continue follow up at rehab and at home   Flush IV access with Sodium Chloride 0.9% and Heparin 10 units/ml or 100 units/ml   Complete by: As directed    Home infusion instructions - [facility]   Complete by: As directed    Instructions: Flush IV access with Sodium Chloride 0.9% and Heparin 10units/ml or 100units/ml   Change dressing on IV access line: Weekly and PRN   Instructions Cath Flo 2mg : Administer for PICC Line occlusion and as ordered by physician for other access device    [facility] pharmacist to adjust dose for: Vancomycin, Aminoglycosides and other anti-infective therapies as requested by physician   Increase activity slowly   Complete by: As directed    Method of administration may be changed at the discretion of home infusion pharmacist based upon assessment of the patient and/or caregiver's ability to self-administer the medication ordered   Complete by: As directed    No dressing needed   Complete by: As directed       Allergies as of 09/17/2021       Reactions   Diclofenac Sodium Other (See Comments)   Kidneys were affected        Medication List     STOP taking these medications    Dextromethorphan-guaiFENesin 5-100 MG/5ML Liqd Replaced by: guaiFENesin-dextromethorphan 100-10 MG/5ML syrup   feeding supplement (PRO-STAT SUGAR FREE 64) Liqd   Fish Oil Ultra 1400 MG Caps   lidocaine 2 % solution Commonly known as: XYLOCAINE   Magnesium 400 MG Caps   metoprolol succinate 25 MG 24 hr tablet Commonly known as: TOPROL-XL   multivitamin with minerals Tabs tablet   prochlorperazine 10 MG tablet Commonly known as: COMPAZINE   sucralfate 1 g tablet Commonly known as: CARAFATE   traMADol 50 MG tablet Commonly known as: ULTRAM   vitamin C 1000 MG tablet   vitamin E 180 MG (400 UNITS)  capsule   Voltaren 1 % Gel Generic drug: diclofenac Sodium       TAKE these medications    acetaminophen 500 MG tablet Commonly known as: TYLENOL Take 500-1,000 mg by mouth every 8 (eight) hours as needed (for pain).   alum & mag hydroxide-simeth 200-200-20 MG/5ML suspension Commonly known as: MAALOX/MYLANTA Take 15-30 mLs by mouth every 2 (two) hours as needed for indigestion.   bisacodyl 10 MG suppository Commonly known as: DULCOLAX Place 1 suppository (10 mg total) rectally daily as needed for moderate constipation.   ceFAZolin  IVPB Commonly known as: ANCEF Inject 2 g into the vein every 8 (eight) hours for 22 days.  Indication:  MSSA bacteremia First Dose: Yes Last Day of Therapy:  10/08/21 Labs - Once weekly:  CBC/D and BMP, Labs - Every other week:  ESR and CRP Method of administration: IV Push Method of administration may be changed at the discretion of home infusion pharmacist based upon assessment of the patient and/or caregiver's ability to self-administer the medication ordered.   cyclobenzaprine 5 MG tablet Commonly known as: FLEXERIL Take 1 tablet (5 mg total) by mouth 3 (three) times daily as needed for muscle spasms.   diltiazem 120 MG tablet Commonly known as: CARDIZEM Take 120 mg by mouth daily.   docusate sodium 100 MG capsule Commonly known as: COLACE Take 1 capsule (100 mg total) by mouth daily.   enoxaparin 80 MG/0.8ML injection Commonly known as: LOVENOX Inject 70 mg into the skin 2 (two) times daily.   feeding supplement Liqd Take 237 mLs by mouth 2 (two) times daily between meals.   folic acid 1 MG tablet Commonly known as: FOLVITE Take 1 tablet (1 mg total) by mouth daily.   gabapentin 300 MG capsule Commonly known as: NEURONTIN Take 300 mg by mouth at bedtime.   guaiFENesin-dextromethorphan 100-10 MG/5ML syrup Commonly known as: ROBITUSSIN DM Take 5 mLs by mouth every 6 (six) hours as needed for cough. Replaces: Dextromethorphan-guaiFENesin 5-100 MG/5ML Liqd   metoprolol tartrate 50 MG tablet Commonly known as: LOPRESSOR Take 1 tablet (50 mg total) by mouth 2 (two) times daily.   oxyCODONE-acetaminophen 5-325 MG tablet Commonly known as: PERCOCET/ROXICET Take 1-2 tablets by mouth every 4 (four) hours as needed for moderate pain.   pantoprazole 40 MG tablet Commonly known as: PROTONIX Take 1 tablet (40 mg total) by mouth daily.   polyethylene glycol 17 g packet Commonly known as: MIRALAX / GLYCOLAX Take 17 g by mouth daily. What changed:  when to take this reasons to take this   rosuvastatin 5 MG tablet Commonly known as: CRESTOR Take 1 tablet (5  mg total) by mouth daily.   Vitamin D3 Super Strength 50 MCG (2000 UT) Caps Generic drug: Cholecalciferol Take 4,000 Units by mouth daily.               Discharge Care Instructions  (From admission, onward)           Start     Ordered   09/17/21 0000  Change dressing on IV access line weekly and PRN  (Home infusion instructions - [facility] )        09/17/21 0903   09/17/21 0000  No dressing needed        09/17/21 9622            Follow-up Information     Angelia Mould, MD Follow up in 4 week(s).   Specialties: Vascular Surgery, Cardiology Why: Office will  call you to arrange your appt (sent) Contact information: [address] [place] [address] [phone]                Allergies  Allergen Reactions   Diclofenac Sodium Other (See Comments)    Kidneys were affected    Consultations: Palliative care Oncology Vascular surgery   Procedures/Studies: DG Chest 1 View  Result Date: 09/15/2021 CLINICAL DATA:  Post left thoracentesis.  History of lung cancer EXAM: CHEST  1 VIEW COMPARISON:  09/11/2021 FINDINGS: Decreasing left pleural effusion following thoracentesis. Continued airspace disease throughout much of the left lung. No pneumothorax. Volume loss on the left. Heart is upper limits normal in size. Mild vascular congestion. IMPRESSION: Decreasing left effusion following thoracentesis.  No pneumothorax. Continued volume loss on the left and diffuse left lung airspace disease. Electronically Signed   By: Rolm Baptise M.D.   On: 09/15/2021 09:58   DG Knee 1-2 Views Left  Result Date: 09/10/2021 CLINICAL DATA:  LEFT knee pain EXAM: LEFT KNEE - 1-2 VIEW COMPARISON:  None. FINDINGS: Osseous alignment is normal. Bone mineralization is normal. No fracture line or displaced fracture fragment. No acute-appearing cortical irregularity or osseous lesion. No appreciable degenerative change. No appreciable joint effusion. Vascular  calcifications are seen within the popliteal fossa. IMPRESSION: 1. No acute findings. No osseous fracture or dislocation. No appreciable degenerative change. 2. Vascular calcifications. Electronically Signed   By: Franki Cabot M.D.   On: 09/10/2021 13:01   DG Abd 1 View  Result Date: 09/10/2021 CLINICAL DATA:  A male at age 89 presents with concern for distended abdomen and abdominal pain. EXAM: ABDOMEN - 1 VIEW COMPARISON:  CT of the abdomen and pelvis from August 24, 2021. FINDINGS: Dense LEFT lower lobe consolidation is similar to previous imaging and associated likely with pleural effusion. RIGHT lung bases clear. EKG leads project over the abdomen. Bowel distension is improved compared to the study of August 20, 2021. There is abundant stool in the rectum on the current exam. Still with mild gaseous distension of small bowel but without substantial dilation of the colon. Signs of cholelithiasis in the RIGHT upper quadrant. On limited assessment there is no acute skeletal process. IMPRESSION: Bowel distension is improved compared to the study of August 20, 2021. Abundant stool in the rectum on the current exam, query fecal impaction. LEFT lower lobe consolidation and likely with pleural effusion similar to previous imaging. Electronically Signed   By: Zetta Bills M.D.   On: 09/10/2021 13:05   CT CHEST W CONTRAST  Addendum Date: 09/10/2021   ADDENDUM REPORT: 09/10/2021 15:24 ADDENDUM: Addendum is made to note the presence of a small air loculation in the upper abdomen about the liver. Although the patient had a percutaneous drainage procedure on 11/17, it would be unusual for air to persist for this length of time and abdominal viscus organ perforation is a concern. Recommend CT abdomen pelvis to further evaluate. These results were called by telephone at the time of interpretation on 09/10/2021 at 3:18 pm to Dr. Landis Gandy, who verbally acknowledged these results. Electronically Signed    By: Delanna Ahmadi M.D.   On: 09/10/2021 15:24   Result Date: 09/10/2021 CLINICAL DATA:  Possible sepsis, lung cancer with radiation EXAM: CT CHEST WITH CONTRAST TECHNIQUE: Multidetector CT imaging of the chest was performed during intravenous contrast administration. CONTRAST:  5mL OMNIPAQUE IOHEXOL 350 MG/ML SOLN COMPARISON:  CT chest angiogram, 08/15/2021 FINDINGS: Cardiovascular: Aortic atherosclerosis. Redemonstrated right lower lobe segmental subsegmental  pulmonary embolism (series 2, image 94). 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 Normal heart size. Extensive left coronary artery calcifications. No pericardial effusion. Mediastinum/Nodes: No enlarged mediastinal, hilar, or axillary lymph nodes. Thyroid gland, trachea, and esophagus demonstrate no significant findings. Lungs/Pleura: Moderate left, small right pleural effusions, slightly increased in volume compared to prior examination. Total atelectasis or consolidation of the left lower lobe, increased compared to prior examination. Otherwise unchanged post treatment appearance of the perihilar left lung with extensive fibrosis and volume loss as well as some interlobular septal thickening and associated ground-glass (series 5, image 57), as well as fibrosis and ground-glass of the paramedian right upper lobe (series 5, image 53). Interval decrease in size of heterogeneous airspace disease and consolidation of the central right lower lobe, measuring 2.0 x 1.8 cm, previously more subsolid and appearance measuring 3.4 x 3.2 cm (series 5, image 105). Upper Abdomen: No acute abnormality. Musculoskeletal: No chest wall mass. Unchanged lytic osseous lesion of the lateral right fifth rib (series 5, image 79) and of the left scapular body (series 5, image 49). IMPRESSION: 1. Moderate left, small right pleural effusions, slightly increased in volume compared to prior examination. 2. Total atelectasis or consolidation of  the left lower lobe, increased compared to prior examination. 3. Otherwise unchanged post treatment appearance of the perihilar left lung with extensive fibrosis and volume loss as well as some interlobular septal thickening and associated ground-glass. There is additional fibrosis and ground-glass of the paramedian right upper lobe. This constellation of findings is most consistent with developing radiation fibrosis with a component of persisting radiation pneumonitis. Superimposed infection would however be difficult to strictly exclude. 4. Expected interval evolution of a pulmonary infarction in the right lower lobe. There is residual right lower lobe embolus noted on this non tailored, non angiographic examination. 5. Unchanged osseous metastatic lesions of the lateral right fifth rib and of the left scapular body. 6. Coronary artery disease. Aortic Atherosclerosis (ICD10-I70.0). Electronically Signed: By: Delanna Ahmadi M.D. On: 09/10/2021 14:26   CT ABDOMEN PELVIS W CONTRAST  Addendum Date: 09/10/2021   ADDENDUM REPORT: 09/10/2021 22:04 ADDENDUM: Interval development of multiple suspected small hepatic metastases as described above. These results were called by telephone at the time of interpretation on 09/10/2021 at 10:01 pm to provider Lovey Newcomer, who verbally acknowledged these results. Electronically Signed   By: Fidela Salisbury M.D.   On: 09/10/2021 22:04   Result Date: 09/10/2021 CLINICAL DATA:  Abdominal distension EXAM: CT ABDOMEN AND PELVIS WITH CONTRAST TECHNIQUE: Multidetector CT imaging of the abdomen and pelvis was performed using the standard protocol following bolus administration of intravenous contrast. CONTRAST:  54mL OMNIPAQUE IOHEXOL 350 MG/ML SOLN COMPARISON:  08/24/2021 FINDINGS: Lower chest: Focal consolidation within the central right lower lobe is again noted and demonstrates slight interval decrease in size better seen on accompanying CT examination of the chest performed  earlier. Bilateral pleural effusions are present, left greater than right with complete collapse of the left lower lobe. Small pericardial effusion. Cardiac size within normal limits. Hepatobiliary: Scattered low-attenuation lesions within the liver are noted. While several of these lesions are compatible with simple cysts, at least 1 is enlarged since prior examination (right hepatic dome, axial image # 14/2) an 2 are new d (16 mm axial image # 18/2 and 11 mm, axial image # 20/2 within the pericaval region) suspicious for progressive metastatic disease in this patient with known metastatic lung cancer. Cholelithiasis without pericholecystic inflammatory change noted.  No intra or extrahepatic biliary ductal dilation. Pancreas: Unremarkable Spleen: Unremarkable Adrenals/Urinary Tract: Adrenal glands are unremarkable. Kidneys are normal in position. Right kidney is normal in size. Left kidney demonstrates interval resolution of previously noted severe hydronephrosis. Superimposed moderate to severe left renal cortical atrophy persists, asymmetrically more severe within the lower pole. Bladder is unremarkable save for mass effect at the bladder base by the hypertrophied central prostate gland. Stomach/Bowel: Small focus of gas seen within the right subdiaphragmatic region is again noted and appears decreased in size since prior examination. Multiple additional foci of gas seen on prior examination appeared over resolved and this may simply represent the residua of the previous intra-abdominal process. Mild ascites persists. Stomach, small bowel, and large bowel are unremarkable save for mild sigmoid diverticulosis. Appendix normal. Rim calcified cystic collection within the mid abdomen appears slightly decreased in size measuring 5.1 x 6.8 cm. l rim enhancing loculated fluid collection compatible with a residual abscess is again seen with the in the deep pelvis demonstrating slight interval decrease in size measuring  2.6 x 4.0 cm at axial image # 72/2. No new intra-abdominal loculated fluid collections. Vascular/Lymphatic: There is a contained aortic leak at the aortic bifurcation with periaortic hematoma identified. The contained leak is best appreciated on axial image # 55/2. This is new since remote prior examination of 07/30/2021. Periaortic hematoma appears relatively stable since prior examination. Superimposed extensive aortoiliac atherosclerotic calcification. No pathologic adenopathy within the abdomen and pelvis. There is intraluminal filling defect identified within the right common femoral vein in keeping with acute DVT extending into the right common iliac vein, stable since prior examination Reproductive: Prostate gland is moderately enlarged. Seminal vesicles are unremarkable. Other: Lobulated soft tissue in the periumbilical region appears stable since prior examination this is of unclear significance. Increasing diffuse body wall subcutaneous edema noted. Musculoskeletal: Lytic lesion within the right sacral ala again noted in keeping with osseous metastatic disease. No acute bone abnormality. IMPRESSION: Contained aortic perforation at the aortic bifurcation with stable periaortic hematoma. Interval resolution of right pericolic gutter abscess. Interval decrease in size of a deep pelvic abscess now measuring 2.6 x 4.0 cm. Decreasing right subdiaphragmatic loculated gas collection, possibly the residua of a previously noted intra-abdominal process. Mild superimposed ascites appears stable. Bilateral pleural effusions are stable, better assessed on prior CT examination. Cholelithiasis. Interval resolution of left hydronephrosis. Superimposed marked left renal atrophy again noted. Slight interval decrease in rim calcified cystic collection within the small bowel mesentery, of unclear significance. This may represent the sequela of prior treated disease, intervention, or a a chronic abscess or hematoma. Stable  lobulated soft tissue within the anterior abdominal wall within the periumbilical region a, of unclear significance. This may be better assessed with dedicated sonography. Stable right iliofemoral DVT. Attempts are being made at this time to contact the managing clinician for direct verbal communication of these findings. Electronically Signed: By: Fidela Salisbury M.D. On: 09/10/2021 21:57   MR KNEE LEFT W WO CONTRAST  Result Date: 09/15/2021 CLINICAL DATA:  Staphylococcus bacteremia. Tenderness along the suprapatellar area. Left knee pain. EXAM: MRI OF THE LEFT KNEE WITHOUT AND WITH CONTRAST TECHNIQUE: Multiplanar, multisequence MR imaging of the left knee was performed both before and after administration of intravenous contrast. CONTRAST:  14mL GADAVIST GADOBUTROL 1 MMOL/ML IV SOLN COMPARISON:  Radiographs dated September 10, 2021 FINDINGS: The exam is limited due to motion. MENISCI Medial: Degenerative changes without evidence of acute tear Lateral: Degenerative changes without evidence of acute tear  LIGAMENTS Cruciates: ACL and PCL are intact. Collaterals: Medial collateral ligament is intact. Lateral collateral ligament complex is intact. CARTILAGE Patellofemoral: Advanced articular cartilage thinning. No evidence of focal full-thickness defect. Medial: Advanced articular cartilage thinning without evidence of full-thickness defect. Lateral: Advanced articular cartilage thinning without evidence of full-thickness defect. JOINT: Small suprapatellar joint effusion. Normal Hoffa's fat-pad. No plical thickening. POPLITEAL FOSSA: Popliteus tendon is intact. No Baker's cyst. EXTENSOR MECHANISM: Intact quadriceps tendon. Intact patellar tendon. Intact lateral patellar retinaculum. Intact medial patellar retinaculum. Intact MPFL. BONES: No aggressive osseous lesion. No fracture or dislocation. Other: No fluid collection or hematoma. Generalized muscle atrophy. Mild subcutaneous soft tissue edema. IMPRESSION: 1. Small  joint effusion, likely reactive. No abnormal enhancement or soft tissue abnormality concerning for septic arthritis. 2. Generalized articular cartilage thinning without evidence of full-thickness defect. No evidence of ligamentous injury. 3. The marrow signal is within normal limits. No evidence of fracture or osteonecrosis. Electronically Signed   By: Keane Police D.O.   On: 09/15/2021 16:49   DG Chest Port 1 View  Result Date: 09/11/2021 CLINICAL DATA:  Status post abdominal aortic aneurysm repair, dyspnea EXAM: PORTABLE CHEST 1 VIEW COMPARISON:  09/09/2021 FINDINGS: Large left pleural effusion is present, enlarged since prior examination, with core aggressive volume loss involving the residual aerated left upper lobe. Complete collapse of the left lower lobe again noted. Superimposed focal opacity within the left upper lobe has enlarged in the interval possibly representing a a superimposed pneumonic infiltrate. Right lung is clear. No pneumothorax or pleural effusion on the right. Cardiac size is mildly enlarged, unchanged. No acute bone abnormality. IMPRESSION: Progressive consolidation within the residual aerated left upper lobe. Superimposed pneumonic infiltrate is not excluded. Enlarging left pleural effusion with collapse of the left lower lobe and progressive compressive atelectasis of the left upper lobe. Stable cardiomegaly Electronically Signed   By: Fidela Salisbury M.D.   On: 09/11/2021 20:30   DG Chest Port 1 View  Result Date: 09/09/2021 CLINICAL DATA:  Concern for sepsis EXAM: PORTABLE CHEST 1 VIEW COMPARISON:  08/19/2019 FINDINGS: normal cardiac silhouette. Moderate LEFT effusion is new from prior. LEFT lung base not well evaluated. RIGHT lung clear. IMPRESSION: New moderate size LEFT effusion. Cannot exclude LEFT lower lobe atelectasis or infiltrate. Electronically Signed   By: Suzy Bouchard M.D.   On: 09/09/2021 13:43   ECHO TEE  Result Date: 09/14/2021    TRANSESOPHOGEAL ECHO  REPORT   Patient Name:   Leon Lyons Date of Exam: 09/14/2021 Medical Rec #:  [id number]         Height:       70.0 in Accession #:    [id number]        Weight:       166.0 lb Date of Birth:  1932/11/02         BSA:          1.928 m Patient Age:    32 years          BP:           92/49 mmHg Patient Gender: M                 HR:           77 bpm. Exam Location:  Inpatient Procedure: Transesophageal Echo, Cardiac Doppler and Color Doppler Indications:     Bacteremia  History:         Patient has prior history of Echocardiogram examinations, most  recent 09/11/2021.  Sonographer:     Merrie Roof RDCS Referring Phys:  [id number] Leanor Kail Diagnosing Phys: Sanda Klein MD PROCEDURE: After discussion of the risks and benefits of a TEE, an informed consent was obtained from the patient. The transesophogeal probe was passed without difficulty through the esophogus of the patient. Sedation performed by different physician. The patient was monitored while under deep sedation. The patient's vital signs; including heart rate, blood pressure, and oxygen saturation; remained stable throughout the procedure. The patient developed no complications during the procedure. IMPRESSIONS  1. Left ventricular ejection fraction, by estimation, is 60 to 65%. The left ventricle has normal function. The left ventricle has no regional wall motion abnormalities.  2. Right ventricular systolic function is normal. The right ventricular size is normal.  3. No left atrial/left atrial appendage thrombus was detected. The LAA emptying velocity was 70 cm/s.  4. Large pleural effusion in the left lateral region.  5. The mitral valve is myxomatous. Trivial mitral valve regurgitation. No evidence of mitral stenosis. There is mild late systolic prolapse of both leaflets of the mitral valve.  6. Tricuspid valve regurgitation is mild to moderate.  7. The aortic valve is tricuspid. There is mild calcification of the aortic valve.  There is mild thickening of the aortic valve. Aortic valve regurgitation is mild. Aortic valve sclerosis/calcification is present, without any evidence of aortic stenosis.  8. There is Severe (Grade IV) plaque involving the aortic arch and descending aorta.  9. The inferior vena cava is normal in size with greater than 50% respiratory variability, suggesting right atrial pressure of 3 mmHg. Conclusion(s)/Recommendation(s): No evidence of vegetation/infective endocarditis on this transesophageael echocardiogram. Consider left thoracentesis as part of further evaluation for cause of bacteremia. FINDINGS  Left Ventricle: Left ventricular ejection fraction, by estimation, is 60 to 65%. The left ventricle has normal function. The left ventricle has no regional wall motion abnormalities. The left ventricular internal cavity size was normal in size. There is  no left ventricular hypertrophy. Right Ventricle: The right ventricular size is normal. No increase in right ventricular wall thickness. Right ventricular systolic function is normal. Left Atrium: Left atrial size was normal in size. No left atrial/left atrial appendage thrombus was detected. The LAA emptying velocity was 70 cm/s. Right Atrium: Right atrial size was normal in size. Pericardium: There is no evidence of pericardial effusion. Mitral Valve: The mitral valve is myxomatous. There is mild late systolic prolapse of both leaflets of the mitral valve. Trivial mitral valve regurgitation. No evidence of mitral valve stenosis. Tricuspid Valve: The tricuspid valve is normal in structure. Tricuspid valve regurgitation is mild to moderate. No evidence of tricuspid stenosis. Aortic Valve: The noncoronary cusp is thickened, calcified and restricted in motion. The aortic valve is tricuspid. There is mild calcification of the aortic valve. There is mild thickening of the aortic valve. Aortic valve regurgitation is mild. Aortic valve sclerosis/calcification is present,  without any evidence of aortic stenosis. Pulmonic Valve: The pulmonic valve was normal in structure. Pulmonic valve regurgitation is not visualized. No evidence of pulmonic stenosis. Aorta: The aortic root is normal in size and structure. There is severe (Grade IV) plaque involving the aortic arch and descending aorta. Venous: The inferior vena cava is normal in size with greater than 50% respiratory variability, suggesting right atrial pressure of 3 mmHg. IAS/Shunts: No atrial level shunt detected by color flow Doppler. Additional Comments: There is a large pleural effusion in the left lateral region. Sanda Klein MD Electronically  signed by Sanda Klein MD Signature Date/Time: 09/14/2021/3:37:22 PM    Final    ECHOCARDIOGRAM LIMITED  Result Date: 09/10/2021    ECHOCARDIOGRAM LIMITED REPORT   Patient Name:   Leon Lyons Date of Exam: 09/10/2021 Medical Rec #:  [id number]         Height:       70.0 in Accession #:    [id number]        Weight:       164.9 lb Date of Birth:  11-22-1932         BSA:          1.923 m Patient Age:    53 years          BP:           107/55 mmHg Patient Gender: M                 HR:           71 bpm. Exam Location:  Inpatient Procedure: Limited Echo, Color Doppler and Cardiac Doppler Indications:    Bacteremia  History:        Patient has prior history of Echocardiogram examinations, most                 recent 08/17/2021. Arrythmias:Atrial Fibrillation; Risk                 Factors:Current Smoker.  Sonographer:    Maudry Mayhew MHA, RDMS, RVT, RDCS Referring Phys: [place]  1. Left ventricular ejection fraction, by estimation, is 60 to 65%. The left ventricle has normal function. The left ventricle has no regional wall motion abnormalities.  2. The mitral valve is normal in structure. Trivial mitral valve regurgitation.  3. Tricuspid valve regurgitation is moderate.  4. There is a calcification on the [place] of the aortic valve. . There is mild  thickening of the aortic valve. Aortic valve regurgitation is trivial. FINDINGS  Left Ventricle: Left ventricular ejection fraction, by estimation, is 60 to 65%. The left ventricle has normal function. The left ventricle has no regional wall motion abnormalities. The left ventricular internal cavity size was normal in size. Pericardium: There is no evidence of pericardial effusion. Mitral Valve: The mitral valve is normal in structure. Trivial mitral valve regurgitation. There is no evidence of mitral valve vegetation. Tricuspid Valve: The tricuspid valve is normal in structure. Tricuspid valve regurgitation is moderate. There is no evidence of tricuspid valve vegetation. Aortic Valve: There is a calcification on the [place] of the aortic valve. There is mild thickening of the aortic valve. Aortic valve regurgitation is trivial. Aortic regurgitation PHT measures 720 msec. LEFT VENTRICLE PLAX 2D LVOT diam:     1.90 cm LVOT Area:     2.84 cm  AORTIC VALVE AI PHT:      720 msec MR Peak grad: 54.8 mmHg   TRICUSPID VALVE MR Vmax:      370.00 cm/s TR Peak grad:   36.7 mmHg                           TR Vmax:        303.00 cm/s                            SHUNTS  Systemic Diam: 1.90 cm Mertie Moores MD Electronically signed by Mertie Moores MD Signature Date/Time: 09/10/2021/3:28:54 PM    Final    [place] EKG SITE RITE  Result Date: 09/16/2021 If Site Rite image not attached, placement could not be confirmed due to current cardiac rhythm.  HYBRID OR IMAGING ([facility] ONLY)  Result Date: 09/11/2021 There is no interpretation for this exam.  This order is for images obtained during a surgical procedure.  Please See "Surgeries" Tab for more information regarding the procedure.   IR THORACENTESIS ASP PLEURAL SPACE W/IMG GUIDE  Result Date: 09/15/2021 INDICATION: Patient with a history of lung cancer and atrial fibrillation presents today with a left pleural effusion. Interventional radiology asked to  perform a diagnostic and therapeutic thoracentesis. EXAM: ULTRASOUND GUIDED THORACENTESIS MEDICATIONS: 1% lidocaine 10 mL COMPLICATIONS: None immediate. PROCEDURE: An ultrasound guided thoracentesis was thoroughly discussed with the patient and questions answered. The benefits, risks, alternatives and complications were also discussed. The patient understands and wishes to proceed with the procedure. Written consent was obtained. Ultrasound was performed to localize and mark an adequate pocket of fluid in the left chest. The area was then prepped and draped in the normal sterile fashion. 1% Lidocaine was used for local anesthesia. Under ultrasound guidance a 6 Fr Safe-T-Centesis catheter was introduced. Thoracentesis was performed. The procedure was stopped early due to patient discomfort. The catheter was removed and a dressing applied. FINDINGS: A total of approximately 800 mL of clear yellow fluid was removed. Samples were sent to the laboratory as requested by the clinical team. IMPRESSION: Successful ultrasound guided left thoracentesis yielding 800 mL of pleural fluid. Read by: Soyla Dryer, NP Electronically Signed   By: Jacqulynn Cadet M.D.   On: 09/15/2021 10:41   (Echo, Carotid, EGD, Colonoscopy, ERCP)    Subjective: Patient was seen and examined.  Denies any overnight events.  His heart rate was elevated but he denies any chest pain or shortness of breath.  He denies any abdominal pain or distention.  No bowel movement for last 7 days.  He was hesitant to take Dulcolax suppository. Telemetry shows A. fib with heart rate anywhere between 100-140.   Discharge Exam: Vitals:   09/17/21 0308 09/17/21 0741  BP: 120/68 119/76  Pulse: 93 100  Resp: 18 19  Temp: (!) 97.4 F (36.3 C) 97.6 F (36.4 C)  SpO2: 95% 96%   Vitals:   09/16/21 1937 09/16/21 2328 09/17/21 0308 09/17/21 0741  BP: 113/70 124/67 120/68 119/76  Pulse: 86 84 93 100  Resp: $Remo'19 20 18 19  'KcJAz$ Temp: 98 F (36.7 C) 97.9 F  (36.6 C) (!) 97.4 F (36.3 C) 97.6 F (36.4 C)  TempSrc: Oral Oral Axillary Oral  SpO2: 96% 94% 95% 96%  Weight:      Height:        General: Pt is alert, awake, not in acute distress Frail and debilitated.  Chronically sick looking.  Anxious but not in any distress. Cardiovascular: tachycardic.  Irregularly irregular.  S1/S2 +, no rubs, no gallops Respiratory: Poor air entry on the left side. Abdominal: Soft, NT, ND, bowel sounds + Extremities: no edema, no cyanosis Mild superficial tenderness on the left knee.    The results of significant diagnostics from this hospitalization (including imaging, microbiology, ancillary and laboratory) are listed below for reference.     Microbiology: Recent Results (from the past 240 hour(s))  Blood Culture (routine x 2)     Status: Abnormal   Collection Time: 09/09/21 12:48  PM   Specimen: BLOOD  Result Value Ref Range Status   Specimen Description   Final    BLOOD LEFT ANTECUBITAL Performed at [facility] [address] [place], [place] [address]    Special Requests   Final    BOTTLES DRAWN AEROBIC AND ANAEROBIC Blood Culture results may not be optimal due to an excessive volume of blood received in culture bottles Performed at [facility] [address] [place], [place] [address]    Culture  Setup Time   Final    GRAM POSITIVE COCCI IN BOTH AEROBIC AND ANAEROBIC BOTTLES CRITICAL RESULT CALLED TO, READ BACK BY AND VERIFIED WITH: PHARMD ELLEN JACKSON 09/10/21@5 :10 BY TW Performed at [facility], [place] [address], [place], [place] [address]    Culture STAPHYLOCOCCUS AUREUS (A)  Final   Report Status 09/12/2021 FINAL  Final   Organism ID, Bacteria STAPHYLOCOCCUS AUREUS  Final      Susceptibility   Staphylococcus aureus - MIC*    CIPROFLOXACIN <=0.5 SENSITIVE Sensitive     ERYTHROMYCIN <=0.25 SENSITIVE Sensitive     GENTAMICIN <=0.5 SENSITIVE Sensitive     OXACILLIN 0.5 SENSITIVE  Sensitive     TETRACYCLINE <=1 SENSITIVE Sensitive     VANCOMYCIN <=0.5 SENSITIVE Sensitive     TRIMETH/SULFA <=10 SENSITIVE Sensitive     CLINDAMYCIN <=0.25 SENSITIVE Sensitive     RIFAMPIN <=0.5 SENSITIVE Sensitive     Inducible Clindamycin NEGATIVE Sensitive     * STAPHYLOCOCCUS AUREUS  Resp Panel by RT-PCR (Flu A&B, Covid) Nasopharyngeal Swab     Status: None   Collection Time: 09/09/21 12:48 PM   Specimen: Nasopharyngeal Swab; Nasopharyngeal(NP) swabs in vial transport medium  Result Value Ref Range Status   SARS Coronavirus 2 by RT PCR NEGATIVE NEGATIVE Final    Comment: (NOTE) SARS-CoV-2 target nucleic acids are NOT DETECTED.  The SARS-CoV-2 RNA is generally detectable in upper respiratory specimens during the acute phase of infection. The lowest concentration of SARS-CoV-2 viral copies this assay can detect is 138 copies/mL. A negative result does not preclude SARS-Cov-2 infection and should not be used as the sole basis for treatment or other patient management decisions. A negative result may occur with  improper specimen collection/handling, submission of specimen other than nasopharyngeal swab, presence of viral mutation(s) within the areas targeted by this assay, and inadequate number of viral copies(<138 copies/mL). A negative result must be combined with clinical observations, patient history, and epidemiological information. The expected result is Negative.  Fact Sheet for Patients:  EntrepreneurPulse.com.au  Fact Sheet for Healthcare Providers:  IncredibleEmployment.be  This test is no t yet approved or cleared by the [place] FDA and  has been authorized for detection and/or diagnosis of SARS-CoV-2 by FDA under an Emergency Use Authorization (EUA). This EUA will remain  in effect (meaning this test can be used) for the duration of the COVID-19 declaration under Section 564(b)(1) of the Act, 21 U.S.C.section  360bbb-3(b)(1), unless the authorization is terminated  or revoked sooner.       Influenza A by PCR NEGATIVE NEGATIVE Final   Influenza B by PCR NEGATIVE NEGATIVE Final    Comment: (NOTE) The Xpert Xpress SARS-CoV-2/FLU/RSV plus assay is intended as an aid in the diagnosis of influenza from Nasopharyngeal swab specimens and should not be used as a sole basis for treatment. Nasal washings and aspirates are unacceptable for Xpert Xpress SARS-CoV-2/FLU/RSV testing.  Fact Sheet for Patients: EntrepreneurPulse.com.au  Fact Sheet for Healthcare  Providers: IncredibleEmployment.be  This test is not yet approved or cleared by the [place] and has been authorized for detection and/or diagnosis of SARS-CoV-2 by FDA under an Emergency Use Authorization (EUA). This EUA will remain in effect (meaning this test can be used) for the duration of the COVID-19 declaration under Section 564(b)(1) of the Act, 21 U.S.C. section 360bbb-3(b)(1), unless the authorization is terminated or revoked.  Performed at [facility], [place] [address] [place], [place] [address]   Blood Culture ID Panel (Reflexed)     Status: Abnormal   Collection Time: 09/09/21 12:48 PM  Result Value Ref Range Status   Enterococcus faecalis NOT DETECTED NOT DETECTED Final   Enterococcus Faecium NOT DETECTED NOT DETECTED Final   Listeria monocytogenes NOT DETECTED NOT DETECTED Final   Staphylococcus species DETECTED (A) NOT DETECTED Final    Comment: CRITICAL RESULT CALLED TO, READ BACK BY AND VERIFIED WITH: PHARMD ELLEN JACKSON 09/10/21@5 :10 BY TW    Staphylococcus aureus (BCID) DETECTED (A) NOT DETECTED Final    Comment: CRITICAL RESULT CALLED TO, READ BACK BY AND VERIFIED WITH: PHARMD ELLEN JACKSON 09/10/21@5 :10 BY TW    Staphylococcus epidermidis NOT DETECTED NOT DETECTED Final   Staphylococcus lugdunensis NOT DETECTED NOT DETECTED Final   Streptococcus  species NOT DETECTED NOT DETECTED Final   Streptococcus agalactiae NOT DETECTED NOT DETECTED Final   Streptococcus pneumoniae NOT DETECTED NOT DETECTED Final   Streptococcus pyogenes NOT DETECTED NOT DETECTED Final   A.calcoaceticus-baumannii NOT DETECTED NOT DETECTED Final   Bacteroides fragilis NOT DETECTED NOT DETECTED Final   Enterobacterales NOT DETECTED NOT DETECTED Final   Enterobacter cloacae complex NOT DETECTED NOT DETECTED Final   Escherichia coli NOT DETECTED NOT DETECTED Final   Klebsiella aerogenes NOT DETECTED NOT DETECTED Final   Klebsiella oxytoca NOT DETECTED NOT DETECTED Final   Klebsiella pneumoniae NOT DETECTED NOT DETECTED Final   Proteus species NOT DETECTED NOT DETECTED Final   Salmonella species NOT DETECTED NOT DETECTED Final   Serratia marcescens NOT DETECTED NOT DETECTED Final   Haemophilus influenzae NOT DETECTED NOT DETECTED Final   Neisseria meningitidis NOT DETECTED NOT DETECTED Final   Pseudomonas aeruginosa NOT DETECTED NOT DETECTED Final   Stenotrophomonas maltophilia NOT DETECTED NOT DETECTED Final   Candida albicans NOT DETECTED NOT DETECTED Final   Candida auris NOT DETECTED NOT DETECTED Final   Candida glabrata NOT DETECTED NOT DETECTED Final   Candida krusei NOT DETECTED NOT DETECTED Final   Candida parapsilosis NOT DETECTED NOT DETECTED Final   Candida tropicalis NOT DETECTED NOT DETECTED Final   Cryptococcus neoformans/gattii NOT DETECTED NOT DETECTED Final   Meth resistant mecA/C and MREJ NOT DETECTED NOT DETECTED Final    Comment: Performed at [facility], [address], [place], [place] [address]  Blood Culture (routine x 2)     Status: Abnormal   Collection Time: 09/09/21 12:53 PM   Specimen: BLOOD  Result Value Ref Range Status   Specimen Description   Final    BLOOD RIGHT ANTECUBITAL Performed at [facility] [address] [place], [place] [address]    Special Requests   Final    BOTTLES DRAWN AEROBIC  AND ANAEROBIC Blood Culture adequate volume Performed at [facility] [address] [place], [place] [address]    Culture  Setup Time   Final    GRAM POSITIVE COCCI IN BOTH AEROBIC AND ANAEROBIC BOTTLES CRITICAL VALUE NOTED.  VALUE IS CONSISTENT WITH PREVIOUSLY REPORTED  AND CALLED VALUE.    Culture (A)  Final    STAPHYLOCOCCUS AUREUS SUSCEPTIBILITIES PERFORMED ON PREVIOUS CULTURE WITHIN THE LAST 5 DAYS. Performed at [facility], [place] [address], [place], [place] [address]    Report Status 09/12/2021 FINAL  Final  Urine Culture     Status: None   Collection Time: 09/09/21  6:46 PM   Specimen: In/Out Cath Urine  Result Value Ref Range Status   Specimen Description   Final    IN/OUT CATH URINE Performed at [facility] [address] [place], [place] [address]    Special Requests   Final    NONE Performed at [facility], [place] [address], [place], [place] [address]    Culture   Final    NO GROWTH Performed at [facility], [place] [address], [place], [place] [address]    Report Status 09/11/2021 FINAL  Final  Culture, blood (routine x 2)     Status: None   Collection Time: 09/11/21  3:11 AM   Specimen: BLOOD RIGHT WRIST  Result Value Ref Range Status   Specimen Description BLOOD RIGHT WRIST  Final   Special Requests   Final    BOTTLES DRAWN AEROBIC AND ANAEROBIC Blood Culture adequate volume   Culture   Final    NO GROWTH 5 DAYS Performed at [facility], [place] [address] [place], [place] [address]    Report Status 09/16/2021 FINAL  Final  Culture, blood (routine x 2)     Status: None   Collection Time: 09/11/21  3:11 AM   Specimen: BLOOD LEFT HAND  Result Value Ref Range Status   Specimen Description BLOOD LEFT HAND  Final   Special Requests   Final    BOTTLES DRAWN AEROBIC ONLY Blood Culture adequate volume   Culture   Final    NO GROWTH 5 DAYS Performed at [facility], [place]  [address] [place], [place] [address]    Report Status 09/16/2021 FINAL  Final  Surgical pcr screen     Status: Abnormal   Collection Time: 09/11/21  6:27 AM   Specimen: Nasal Mucosa; Nasal Swab  Result Value Ref Range Status   MRSA, PCR NEGATIVE NEGATIVE Final   Staphylococcus aureus POSITIVE (A) NEGATIVE Final    Comment: (NOTE) The Xpert SA Assay (FDA approved for NASAL specimens in patients 31 years of age and older), is one component of a comprehensive surveillance program. It is not intended to diagnose infection nor to guide or monitor treatment. Performed at [facility], [place] [address], [place], [place] [address]   Gram stain     Status: None   Collection Time: 09/15/21 10:04 AM   Specimen: Lung, Left; Pleural Fluid  Result Value Ref Range Status   Specimen Description PLEURAL FLUID  Final   Special Requests LEFT LUNG  Final   Gram Stain   Final    WBC PRESENT,BOTH PMN AND MONONUCLEAR GRAM NEGATIVE RODS CYTOSPIN SMEAR Performed at [facility], [place] [address], [place], [place] [address]    Report Status 09/16/2021 FINAL  Final  Culture, body fluid w Gram Stain-bottle     Status: None (Preliminary result)   Collection Time: 09/15/21 10:04 AM   Specimen: Pleura  Result Value Ref Range Status   Specimen Description PLEURAL FLUID  Final   Special Requests NONE  Final   Culture   Final    NO GROWTH <  24 HOURS Performed at [facility], [place] [address], [place], [place] [address]    Report Status PENDING  Incomplete     Labs: BNP (last 3 results) No results for input(s): BNP in the last 8760 hours. Basic Metabolic Panel: Recent Labs  Lab 09/11/21 0830 09/11/21 0957 09/12/21 0136 09/14/21 0200  NA 138 135 134* 134*  K 3.3* 3.3* 4.4 3.5  CL  --  107 105 106  CO2  --  24 22 20*  GLUCOSE  --  106* 117* 93  BUN  --  10 11 12   CREATININE  --  0.66 0.78 0.77  CALCIUM  --  8.1* 8.3* 7.9*  MG  --  1.9  --   --    Liver Function Tests: No results for  input(s): AST, ALT, ALKPHOS, BILITOT, PROT, ALBUMIN in the last 168 hours. No results for input(s): LIPASE, AMYLASE in the last 168 hours. No results for input(s): AMMONIA in the last 168 hours. CBC: Recent Labs  Lab 09/11/21 0830 09/11/21 0957 09/12/21 0136 09/14/21 0200 09/17/21 0507  WBC  --  8.1 10.9* 5.5 7.7  HGB 9.5* 9.7* 10.6* 10.0* 10.5*  HCT 28.0* 30.3* 33.5* 31.4* 33.0*  MCV  --  93.2 92.8 94.9 94.8  PLT  --  160 140* 176 208   Cardiac Enzymes: No results for input(s): CKTOTAL, CKMB, CKMBINDEX, TROPONINI in the last 168 hours. BNP: Invalid input(s): POCBNP CBG: No results for input(s): GLUCAP in the last 168 hours. D-Dimer No results for input(s): DDIMER in the last 72 hours. Hgb A1c No results for input(s): HGBA1C in the last 72 hours. Lipid Profile No results for input(s): CHOL, HDL, LDLCALC, TRIG, CHOLHDL, LDLDIRECT in the last 72 hours. Thyroid function studies No results for input(s): TSH, T4TOTAL, T3FREE, THYROIDAB in the last 72 hours.  Invalid input(s): FREET3 Anemia work up No results for input(s): VITAMINB12, FOLATE, FERRITIN, TIBC, IRON, RETICCTPCT in the last 72 hours. Urinalysis    Component Value Date/Time   COLORURINE YELLOW 09/09/2021 1846   APPEARANCEUR CLEAR 09/09/2021 1846   LABSPEC 1.012 09/09/2021 1846   PHURINE 6.0 09/09/2021 1846   GLUCOSEU NEGATIVE 09/09/2021 1846   HGBUR NEGATIVE 09/09/2021 1846   BILIRUBINUR NEGATIVE 09/09/2021 1846   KETONESUR NEGATIVE 09/09/2021 1846   PROTEINUR NEGATIVE 09/09/2021 1846   NITRITE NEGATIVE 09/09/2021 1846   LEUKOCYTESUR NEGATIVE 09/09/2021 1846   Sepsis Labs Invalid input(s): PROCALCITONIN,  WBC,  LACTICIDVEN Microbiology Recent Results (from the past 240 hour(s))  Blood Culture (routine x 2)     Status: Abnormal   Collection Time: 09/09/21 12:48 PM   Specimen: BLOOD  Result Value Ref Range Status   Specimen Description   Final    BLOOD LEFT ANTECUBITAL Performed at [facility], [place] [address] [place], [place] [address]    Special Requests   Final    BOTTLES DRAWN AEROBIC AND ANAEROBIC Blood Culture results may not be optimal due to an excessive volume of blood received in culture bottles Performed at [facility] [address], [place], [place] [address]    Culture  Setup Time   Final    GRAM POSITIVE COCCI IN BOTH AEROBIC AND ANAEROBIC BOTTLES CRITICAL RESULT CALLED TO, READ BACK BY AND VERIFIED WITH: PHARMD ELLEN JACKSON 09/10/21@5 :10 BY TW Performed at [name], [place] [address] [place], [place] [address]    Culture STAPHYLOCOCCUS AUREUS (A)  Final   Report Status 09/12/2021 FINAL  Final  Organism ID, Bacteria STAPHYLOCOCCUS AUREUS  Final      Susceptibility   Staphylococcus aureus - MIC*    CIPROFLOXACIN <=0.5 SENSITIVE Sensitive     ERYTHROMYCIN <=0.25 SENSITIVE Sensitive     GENTAMICIN <=0.5 SENSITIVE Sensitive     OXACILLIN 0.5 SENSITIVE Sensitive     TETRACYCLINE <=1 SENSITIVE Sensitive     VANCOMYCIN <=0.5 SENSITIVE Sensitive     TRIMETH/SULFA <=10 SENSITIVE Sensitive     CLINDAMYCIN <=0.25 SENSITIVE Sensitive     RIFAMPIN <=0.5 SENSITIVE Sensitive     Inducible Clindamycin NEGATIVE Sensitive     * STAPHYLOCOCCUS AUREUS  Resp Panel by RT-PCR (Flu A&B, Covid) Nasopharyngeal Swab     Status: None   Collection Time: 09/09/21 12:48 PM   Specimen: Nasopharyngeal Swab; Nasopharyngeal(NP) swabs in vial transport medium  Result Value Ref Range Status   SARS Coronavirus 2 by RT PCR NEGATIVE NEGATIVE Final    Comment: (NOTE) SARS-CoV-2 target nucleic acids are NOT DETECTED.  The SARS-CoV-2 RNA is generally detectable in upper respiratory specimens during the acute phase of infection. The lowest concentration of SARS-CoV-2 viral copies this assay can detect is 138 copies/mL. A negative result does not preclude SARS-Cov-2 infection and should not be used as the sole basis for treatment or other patient  management decisions. A negative result may occur with  improper specimen collection/handling, submission of specimen other than nasopharyngeal swab, presence of viral mutation(s) within the areas targeted by this assay, and inadequate number of viral copies(<138 copies/mL). A negative result must be combined with clinical observations, patient history, and epidemiological information. The expected result is Negative.  Fact Sheet for Patients:  EntrepreneurPulse.com.au  Fact Sheet for Healthcare Providers:  IncredibleEmployment.be  This test is no t yet approved or cleared by the [place] FDA and  has been authorized for detection and/or diagnosis of SARS-CoV-2 by FDA under an Emergency Use Authorization (EUA). This EUA will remain  in effect (meaning this test can be used) for the duration of the COVID-19 declaration under Section 564(b)(1) of the Act, 21 U.S.C.section 360bbb-3(b)(1), unless the authorization is terminated  or revoked sooner.       Influenza A by PCR NEGATIVE NEGATIVE Final   Influenza B by PCR NEGATIVE NEGATIVE Final    Comment: (NOTE) The Xpert Xpress SARS-CoV-2/FLU/RSV plus assay is intended as an aid in the diagnosis of influenza from Nasopharyngeal swab specimens and should not be used as a sole basis for treatment. Nasal washings and aspirates are unacceptable for Xpert Xpress SARS-CoV-2/FLU/RSV testing.  Fact Sheet for Patients: EntrepreneurPulse.com.au  Fact Sheet for Healthcare Providers: IncredibleEmployment.be  This test is not yet approved or cleared by the [place] FDA and has been authorized for detection and/or diagnosis of SARS-CoV-2 by FDA under an Emergency Use Authorization (EUA). This EUA will remain in effect (meaning this test can be used) for the duration of the COVID-19 declaration under Section 564(b)(1) of the Act, 21 U.S.C. section 360bbb-3(b)(1),  unless the authorization is terminated or revoked.  Performed at [facility] [place] [address] [place], [place] [address]   Blood Culture ID Panel (Reflexed)     Status: Abnormal   Collection Time: 09/09/21 12:48 PM  Result Value Ref Range Status   Enterococcus faecalis NOT DETECTED NOT DETECTED Final   Enterococcus Faecium NOT DETECTED NOT DETECTED Final   Listeria monocytogenes NOT DETECTED NOT DETECTED Final   Staphylococcus species DETECTED (A) NOT DETECTED Final    Comment: CRITICAL RESULT CALLED TO,  READ BACK BY AND VERIFIED WITH: PHARMD ELLEN JACKSON 09/10/21@5 :10 BY TW    Staphylococcus aureus (BCID) DETECTED (A) NOT DETECTED Final    Comment: CRITICAL RESULT CALLED TO, READ BACK BY AND VERIFIED WITH: PHARMD ELLEN JACKSON 09/10/21@5 :10 BY TW    Staphylococcus epidermidis NOT DETECTED NOT DETECTED Final   Staphylococcus lugdunensis NOT DETECTED NOT DETECTED Final   Streptococcus species NOT DETECTED NOT DETECTED Final   Streptococcus agalactiae NOT DETECTED NOT DETECTED Final   Streptococcus pneumoniae NOT DETECTED NOT DETECTED Final   Streptococcus pyogenes NOT DETECTED NOT DETECTED Final   A.calcoaceticus-baumannii NOT DETECTED NOT DETECTED Final   Bacteroides fragilis NOT DETECTED NOT DETECTED Final   Enterobacterales NOT DETECTED NOT DETECTED Final   Enterobacter cloacae complex NOT DETECTED NOT DETECTED Final   Escherichia coli NOT DETECTED NOT DETECTED Final   Klebsiella aerogenes NOT DETECTED NOT DETECTED Final   Klebsiella oxytoca NOT DETECTED NOT DETECTED Final   Klebsiella pneumoniae NOT DETECTED NOT DETECTED Final   Proteus species NOT DETECTED NOT DETECTED Final   Salmonella species NOT DETECTED NOT DETECTED Final   Serratia marcescens NOT DETECTED NOT DETECTED Final   Haemophilus influenzae NOT DETECTED NOT DETECTED Final   Neisseria meningitidis NOT DETECTED NOT DETECTED Final   Pseudomonas aeruginosa NOT DETECTED NOT DETECTED Final    Stenotrophomonas maltophilia NOT DETECTED NOT DETECTED Final   Candida albicans NOT DETECTED NOT DETECTED Final   Candida auris NOT DETECTED NOT DETECTED Final   Candida glabrata NOT DETECTED NOT DETECTED Final   Candida krusei NOT DETECTED NOT DETECTED Final   Candida parapsilosis NOT DETECTED NOT DETECTED Final   Candida tropicalis NOT DETECTED NOT DETECTED Final   Cryptococcus neoformans/gattii NOT DETECTED NOT DETECTED Final   Meth resistant mecA/C and MREJ NOT DETECTED NOT DETECTED Final    Comment: Performed at [facility], [address] [place], [place] [address]  Blood Culture (routine x 2)     Status: Abnormal   Collection Time: 09/09/21 12:53 PM   Specimen: BLOOD  Result Value Ref Range Status   Specimen Description   Final    BLOOD RIGHT ANTECUBITAL Performed at [facility] [address], [place], [place] [address]    Special Requests   Final    BOTTLES DRAWN AEROBIC AND ANAEROBIC Blood Culture adequate volume Performed at [facility] [address], [place], [place] [address]    Culture  Setup Time   Final    GRAM POSITIVE COCCI IN BOTH AEROBIC AND ANAEROBIC BOTTLES CRITICAL VALUE NOTED.  VALUE IS CONSISTENT WITH PREVIOUSLY REPORTED AND CALLED VALUE.    Culture (A)  Final    STAPHYLOCOCCUS AUREUS SUSCEPTIBILITIES PERFORMED ON PREVIOUS CULTURE WITHIN THE LAST 5 DAYS. Performed at [facility], [place] [address], [place], [place] [address]    Report Status 09/12/2021 FINAL  Final  Urine Culture     Status: None   Collection Time: 09/09/21  6:46 PM   Specimen: In/Out Cath Urine  Result Value Ref Range Status   Specimen Description   Final    IN/OUT CATH URINE Performed at [facility] [address], [place], [place] [address]    Special Requests   Final    NONE Performed at [facility], [place] [address], [place], [place] [address]    Culture   Final    NO  GROWTH Performed at [facility], [place] [address], [place],  [address]  Report Status 09/11/2021 FINAL  Final  Culture, blood (routine x 2)     Status: None   Collection Time: 09/11/21  3:11 AM   Specimen: BLOOD RIGHT WRIST  Result Value Ref Range Status   Specimen Description BLOOD RIGHT WRIST  Final   Special Requests   Final    BOTTLES DRAWN AEROBIC AND ANAEROBIC Blood Culture adequate volume   Culture   Final    NO GROWTH 5 DAYS Performed at [facility], [address], [place], [place] [address]    Report Status 09/16/2021 FINAL  Final  Culture, blood (routine x 2)     Status: None   Collection Time: 09/11/21  3:11 AM   Specimen: BLOOD LEFT HAND  Result Value Ref Range Status   Specimen Description BLOOD LEFT HAND  Final   Special Requests   Final    BOTTLES DRAWN AEROBIC ONLY Blood Culture adequate volume   Culture   Final    NO GROWTH 5 DAYS Performed at [facility], [place] [address], [place], [place] [address]    Report Status 09/16/2021 FINAL  Final  Surgical pcr screen     Status: Abnormal   Collection Time: 09/11/21  6:27 AM   Specimen: Nasal Mucosa; Nasal Swab  Result Value Ref Range Status   MRSA, PCR NEGATIVE NEGATIVE Final   Staphylococcus aureus POSITIVE (A) NEGATIVE Final    Comment: (NOTE) The Xpert SA Assay (FDA approved for NASAL specimens in patients 10 years of age and older), is one component of a comprehensive surveillance program. It is not intended to diagnose infection nor to guide or monitor treatment. Performed at [facility], [place] [address], [place], [place] [address]   Gram stain     Status: None   Collection Time: 09/15/21 10:04 AM   Specimen: Lung, Left; Pleural Fluid  Result Value Ref Range Status   Specimen Description PLEURAL FLUID  Final   Special Requests LEFT LUNG  Final   Gram Stain   Final    WBC PRESENT,BOTH PMN AND MONONUCLEAR GRAM NEGATIVE RODS CYTOSPIN SMEAR Performed at [facility], [place] [address] [place], [place] [address]    Report Status 09/16/2021 FINAL  Final  Culture, body fluid w Gram Stain-bottle     Status: None (Preliminary result)   Collection Time: 09/15/21 10:04 AM   Specimen: Pleura  Result Value Ref Range Status   Specimen Description PLEURAL FLUID  Final   Special Requests NONE  Final   Culture   Final    NO GROWTH < 24 HOURS Performed at [facility], [place] [address], [place], [place] [address]    Report Status PENDING  Incomplete     Time coordinating discharge:  45 minutes  SIGNED:   Barb Merino, MD  [facility] 09/17/2021, 9:03 AM

## 2021-09-17 NOTE — Progress Notes (Addendum)
Peripherally Inserted Central Catheter Placement  The IV Nurse has discussed with the patient and/or persons authorized to consent for the patient, the purpose of this procedure and the potential benefits and risks involved with this procedure.  The benefits include less needle sticks, lab draws from the catheter, and the patient may be discharged home with the catheter. Risks include, but not limited to, infection, bleeding, blood clot (thrombus formation), and puncture of an artery; nerve damage and irregular heartbeat and possibility to perform a PICC exchange if needed/ordered by physician.  Alternatives to this procedure were also discussed.  Bard Power PICC patient education guide, fact sheet on infection prevention and patient information card has been provided to patient /or left at bedside.  Wife at bedside and agreeable to PICC placement.  PICC Placement Documentation  PICC Single Lumen 09/17/21 Right Brachial 40 cm 0 cm (Active)  Indication for Insertion or Continuance of Line Home intravenous therapies (PICC only) 09/17/21 1100  Exposed Catheter (cm) 0 cm 09/17/21 1100  Site Assessment Clean;Dry;Intact 09/17/21 1100  Line Status Flushed;Saline locked;Blood return noted 09/17/21 1100  Dressing Type Transparent;Securing device 09/17/21 1100  Dressing Status Clean;Dry;Intact 09/17/21 1100  Antimicrobial disc in place? Yes 09/17/21 1100  Safety Lock Not Applicable 30/16/01 0932  Line Care Connections checked and tightened 09/17/21 1100  Line Adjustment (NICU/IV Team Only) No 09/17/21 1100  Dressing Intervention New dressing;Antimicrobial disc changed;Securement device changed 09/17/21 1100  Dressing Change Due 09/24/21 09/17/21 [place] 09/17/2021, 11:54 AM

## 2021-09-17 NOTE — H&P (Signed)
Physical Medicine and Rehabilitation Admission H&P CC: generalized weakness  HPI: Leon Lyons is an 87 y.o. 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  During this hospitalization, he required endovascular repair for an aortic leak and aortic bifurcation- and developed a periaortic hematoma- (Vascular to recheck CT scan in 4 weeks) to stay on Lovenox)- of note,  s/p 3 units pRBCs.  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 He was also seen by Palliative care and made DNR- he is to follow up with Oncology after discharge. Also found to have MSSA bacteremia- and on Cefazolin for 4 weeks- from 09/09/21. Of note, TTE with vegetation and TEE also no vegetation; S/P L thoracentesis with no infection seen- Cultures pending.  He had a complicated hospitalization and after everything that occurred medically, he was seen by PT and OT- found to have LE>UE weakness and have impairment in his mobility and ADLs and so therefore is being admitted to inpatient rehab for CIR.  Currently, he reports he feels very weak since in hospital- LBM was 8 days ago- nursing trying to get him to have BM prior to CIR admission. He's voiding OK.  Is passing gas- and usually goes every 3-4 days.  Having pain- back pain and L side- shoulder, LUE, knee and hip all hurt- meds helping pain.  Denies SOB- but breathes shallow per pt- is "normal for him".    Review of Systems  Constitutional:  Positive for fatigue. Negative for fever and unexpected weight change.  HENT: Negative.  Negative for facial swelling, postnasal drip and sinus pressure.   Eyes: Negative.   Respiratory:  Negative for cough, shortness of breath and wheezing.   Cardiovascular:  Positive for leg swelling. Negative for chest pain.  Gastrointestinal:  Positive for constipation.  Negative for blood in stool, diarrhea and nausea.  Endocrine: Negative.   Genitourinary:  Negative for difficulty urinating, frequency and hematuria.  Musculoskeletal:  Positive for arthralgias, back pain and myalgias. Negative for neck pain.  Skin: Negative.   Allergic/Immunologic: Negative.   Neurological:  Positive for weakness. Negative for light-headedness, numbness and headaches.  Hematological: Negative.   Psychiatric/Behavioral:  Negative for confusion and sleep disturbance. The patient is not nervous/anxious.   All other systems reviewed and are negative. Past Medical History:  Diagnosis Date   History of radiation therapy 03/24/2021   left lung   02/11/2021-03/24/2021  Dr Gery Pray   Lung cancer [facility])    Tobacco use    Past Surgical History:  Procedure Laterality Date   ABDOMINAL AORTIC ENDOVASCULAR STENT GRAFT Bilateral 09/11/2021   Procedure: EVAR;  Surgeon: Angelia Mould, MD;  Location: [facility];  Service: Vascular;  Laterality: Bilateral;   APPENDECTOMY     BRONCHIAL BIOPSY  01/07/2021   Procedure: BRONCHIAL BIOPSIES;  Surgeon: Garner Nash, DO;  Location: [facility];  Service: Pulmonary;;   BRONCHIAL BRUSHINGS  01/07/2021   Procedure: BRONCHIAL BRUSHINGS;  Surgeon: Garner Nash, DO;  Location: [facility] [facility];  Service: Pulmonary;;   BRONCHIAL NEEDLE ASPIRATION BIOPSY  01/07/2021   Procedure: BRONCHIAL NEEDLE ASPIRATION BIOPSIES;  Surgeon: Garner Nash, DO;  Location: [facility];  Service: Pulmonary;;   BRONCHIAL WASHINGS  01/07/2021   Procedure: BRONCHIAL WASHINGS;  Surgeon: Garner Nash, DO;  Location: [facility];  Service: Pulmonary;;   IR THORACENTESIS ASP  PLEURAL SPACE W/IMG GUIDE  09/15/2021   IRRIGATION AND DEBRIDEMENT SEBACEOUS CYST     TEE WITHOUT CARDIOVERSION N/A 09/14/2021   Procedure: TRANSESOPHAGEAL ECHOCARDIOGRAM (TEE);  Surgeon: Sanda Klein, MD;  Location: [place] [facility];  Service: Cardiovascular;  Laterality: N/A;   VIDEO BRONCHOSCOPY  WITH ENDOBRONCHIAL ULTRASOUND Bilateral 01/07/2021   Procedure: VIDEO BRONCHOSCOPY WITH ENDOBRONCHIAL ULTRASOUND;  Surgeon: Garner Nash, DO;  Location: [facility];  Service: Pulmonary;  Laterality: Bilateral;  possible need for radial ultrasound and fluoro    Family History  Problem Relation Age of Onset   Hypertension Other    Social History:  reports that he quit smoking about 60 years ago. His smoking use included cigarettes. He has never used smokeless tobacco. No history on file for alcohol use and drug use. Allergies:  Allergies  Allergen Reactions   Diclofenac Sodium Other (See Comments)    Kidneys were affected   Medications Prior to Admission  Medication Sig Dispense Refill   acetaminophen (TYLENOL) 500 MG tablet Take 500-1,000 mg by mouth every 8 (eight) hours as needed (for pain).     Cholecalciferol (VITAMIN D3 SUPER STRENGTH) 50 MCG (2000 UT) CAPS Take 4,000 Units by mouth daily.     Dextromethorphan-guaiFENesin 5-100 MG/5ML LIQD Take 5 mLs by mouth every 6 (six) hours as needed (for cough or congestion).     diltiazem (CARDIZEM) 120 MG tablet Take 120 mg by mouth daily.     enoxaparin (LOVENOX) 80 MG/0.8ML injection Inject 70 mg into the skin 2 (two) times daily.     gabapentin (NEURONTIN) 300 MG capsule Take 300 mg by mouth at bedtime.     [DISCONTINUED] Amino Acids-Protein Hydrolys (FEEDING SUPPLEMENT, PRO-STAT SUGAR FREE 64,) LIQD Take 15 mLs by mouth in the morning and at bedtime.     [DISCONTINUED] VOLTAREN 1 % GEL Apply 2 g topically See admin instructions. Apply 2 grams of gel to the shoulders, legs, or other painful sites 2 times a day as needed for pain     Multiple Vitamin (MULTIVITAMIN WITH MINERALS) TABS tablet Take 1 tablet by mouth once a week. (Patient not taking: Reported on 09/09/2021)     Omega-3 Fatty Acids (FISH OIL ULTRA) 1400 MG CAPS Take 1,400 mg by mouth once a week. (Patient not taking: Reported on 09/09/2021)     [DISCONTINUED] Ascorbic Acid  (VITAMIN C) 1000 MG tablet Take 1,000 mg by mouth once a week. (Patient not taking: Reported on 03/08/2021)     [DISCONTINUED] lidocaine (XYLOCAINE) 2 % solution Mix 1 part 2% viscous lidocaine,1 part H2O. Swallow 44mL of diluted mixture, 59min before meals, up to QID. Use PRN soreness (Patient not taking: Reported on 09/09/2021) 100 mL 3   [DISCONTINUED] Magnesium 400 MG CAPS Take 400 mg by mouth once a week. (Patient not taking: Reported on 09/09/2021)     [DISCONTINUED] metoprolol succinate (TOPROL-XL) 25 MG 24 hr tablet Take 25 mg by mouth See admin instructions. Take 25 mg by mouth at bedtime and hold if Systolic number is <330 or heart rate is <65 (Patient not taking: Reported on 09/09/2021)     [DISCONTINUED] polyethylene glycol (MIRALAX / GLYCOLAX) 17 g packet Take 17 g by mouth daily. (Patient taking differently: Take 17 g by mouth daily as needed for mild constipation (mix and drink).) 14 each 0   [DISCONTINUED] prochlorperazine (COMPAZINE) 10 MG tablet Take 1 tablet (10 mg total) by mouth every 6 (six) hours as needed for nausea or vomiting. (Patient not taking: Reported on 09/09/2021)  30 tablet 0   [DISCONTINUED] sucralfate (CARAFATE) 1 g tablet Dissolve 1 tablet in 8mL H2O and swallow up to QID, PRN sore throat. (Patient not taking: Reported on 09/09/2021) 40 tablet 2   [DISCONTINUED] traMADol (ULTRAM) 50 MG tablet Take by mouth every 6 (six) hours as needed. (Patient not taking: Reported on 09/09/2021)     [DISCONTINUED] vitamin E 180 MG (400 UNITS) capsule Take 400 Units by mouth once a week. (Patient not taking: Reported on 09/09/2021)      Drug Regimen Review  Drug regimen was reviewed and remains appropriate with no significant issues identified   Home: Home Living Family/patient expects to be discharged to:: Private residence Living Arrangements: Spouse/significant other Available Help at Discharge: Family Type of Home: House Home Access: Stairs to enter Technical brewer of  Steps: 1 Home Layout: One level Home Equipment: Conservation officer, nature (2 wheels), Rollator (4 wheels) Additional Comments: reports his wife ordered a shower chair online recently   Functional History: Prior Function Prior Level of Function : Needs assist Physical Assist : ADLs (physical) ADLs (physical): Bathing, IADLs Mobility Comments: pt reports recently using RW for mobility ADLs Comments: reports recent assist with sponge bathing from wife. typically Independent with ADLs, standing for showers and enjoyed working out in yard  Functional Status:  Mobility: Bed Mobility Overal bed mobility: Needs Assistance Bed Mobility: Sit to Supine Rolling: Supervision Sidelying to sit: Mod assist, HOB elevated, Max assist Supine to sit: Mod assist, HOB elevated Sit to supine: Max assist, +2 for physical assistance Sit to sidelying: Mod assist General bed mobility comments: 2 person assistance to manage stedy equipment and guide pts. trunk and b les into bed Transfers Overall transfer level: Needs assistance Equipment used: Ambulation equipment used Transfers: Bed to chair/wheelchair/BSC, Sit to/from Stand Sit to Stand: +2 physical assistance, Mod assist Bed to/from chair/wheelchair/BSC transfer type:: Via Lift equipment  Lateral/Scoot Transfers: Max Barrister's clerk via Lift Equipment: Stedy General transfer comment: Heavy mod A +2 to stand from stedy onto eob; cues for upright posture and knee extension; able to stand 10-15 seconds. Mod A +2 for eccentric control lowering to eob from chair.      ADL: ADL Overall ADL's : Needs assistance/impaired Eating/Feeding: Set up, Sitting Grooming: Set up, Sitting Upper Body Bathing: Minimal assistance, Sitting Upper Body Bathing Details (indicate cue type and reason): to bathe back sitting EOB Lower Body Bathing: Maximal assistance, Sitting/lateral leans Upper Body Dressing : Minimal assistance, Sitting Lower Body Dressing: Maximal assistance,  Sitting/lateral leans Lower Body Dressing Details (indicate cue type and reason): attempted cross legs over each knee, pt. lifted up and said "thats it cant do it".  encouraged to try to cross leg over and he said "thats it".  pointed to wife and states "she does that for me".  but also reported he was able to perform LB dressing Toilet Transfer: +2 for safety/equipment, Minimal assistance (used stedy) Toilet Transfer Details (indicate cue type and reason): simulated with use of stedy from chair to bed 2 person assist for guiding pt. and equipement safely Toileting- Clothing Manipulation and Hygiene: Maximal assistance, Sitting/lateral lean, Bed level General ADL Comments: limited progression as pt. for adls secondary to pt. stating he was unable to attempt lb adls.  utilized stedy equipment for transfer back to bed pt. reporting he is unable to stand without assistance noting LUE limited function along with LLE  Cognition: Cognition Overall Cognitive Status: Within Functional Limits for tasks assessed Orientation Level: Oriented X4 Cognition Arousal/Alertness: Awake/alert  Behavior During Therapy: WFL for tasks assessed/performed Overall Cognitive Status: Within Functional Limits for tasks assessed General Comments: Pt agreeable to therapy today without encouragement. Stated he knows he needs to do it.   Blood pressure 119/76, pulse 100, temperature 97.6 F (36.4 C), temperature source Oral, resp. rate 19, height 5\' 10"  (1.778 m), weight 75.3 kg, SpO2 96 %. Physical Exam Vitals and nursing note reviewed. Exam conducted with a chaperone present.  Constitutional:      Comments: Pt sitting up in bed; a little frail, but appears younger than stated age; needed assistance to turn; on air bed for back pain; nursing in room most of visit, NAD; on monitor  HENT:     Head: Normocephalic and atraumatic.     Comments: Smile equal    Right Ear: External ear normal.     Left Ear: External ear normal.      Nose: Nose normal. No congestion.     Mouth/Throat:     Mouth: Mucous membranes are dry.     Pharynx: Oropharynx is clear. No oropharyngeal exudate.  Eyes:     General:        Right eye: No discharge.        Left eye: No discharge.     Extraocular Movements: Extraocular movements intact.  Cardiovascular:     Rate and Rhythm: Normal rate. Rhythm irregular.     Heart sounds: Normal heart sounds. No murmur heard.   No gallop.  Pulmonary:     Comments: Slightly decreased at bases L>R- otherwise no W/R/R- clear B/L  Abdominal:     Comments: Abdominal incision- C/D/I- small midline vertical incision Firmish; NT; somewhat distended vs protuberant; hypoactive BS  Musculoskeletal:     Cervical back: Normal range of motion. No rigidity.     Comments: MS: RUE- 5-/5 in deltoid, biceps, triceps, WE, grip and FA LUE- deltoid 2-/5; weak vs pain; Biceps 4-/5; Triceps 3-/5; WE 4-/5 grip 4-/5; FA 4-/5 RLE- 4+/5 in HF, KE, KF DF and PF LLE- Proximal 3+/5; distal 4-/5  Skin:    General: Skin is warm and dry.     Comments: Heels OK Abd dressing as above B/L groins healed over- trace pinkness A LOT of arm ecchymoses/bruising noted Blanchable redness on backside with healed over/scarred spots- previous pimples? L thoracentesis spot on posterior thoracic spine- bandaid covering LUE 2 IV-s look OK PICC line RUE- looks OK   Neurological:     Comments: Intact to light touch in all 4 extremities B/L  CN's intact on exam Ox3- appropriate  Psychiatric:     Comments: Sad, slightly flat affect; appropriate    Results for orders placed or performed during the hospital encounter of 09/09/21 (from the past 48 hour(s))  CBC     Status: Abnormal   Collection Time: 09/17/21  5:07 AM  Result Value Ref Range   WBC 7.7 4.0 - 10.5 K/uL   RBC 3.48 (L) 4.22 - 5.81 MIL/uL   Hemoglobin 10.5 (L) 13.0 - 17.0 g/dL   HCT 33.0 (L) 39.0 - 52.0 %   MCV 94.8 80.0 - 100.0 fL   MCH 30.2 26.0 - 34.0 pg   MCHC 31.8  30.0 - 36.0 g/dL   RDW 17.2 (H) 11.5 - 15.5 %   Platelets 208 150 - 400 K/uL   nRBC 0.0 0.0 - 0.2 %    Comment: Performed at [facility], [address] [place], [place] [address]   MR KNEE LEFT W WO  CONTRAST  Result Date: 09/15/2021 CLINICAL DATA:  Staphylococcus bacteremia. Tenderness along the suprapatellar area. Left knee pain. EXAM: MRI OF THE LEFT KNEE WITHOUT AND WITH CONTRAST TECHNIQUE: Multiplanar, multisequence MR imaging of the left knee was performed both before and after administration of intravenous contrast. CONTRAST:  39mL GADAVIST GADOBUTROL 1 MMOL/ML IV SOLN COMPARISON:  Radiographs dated September 10, 2021 FINDINGS: The exam is limited due to motion. MENISCI Medial: Degenerative changes without evidence of acute tear Lateral: Degenerative changes without evidence of acute tear LIGAMENTS Cruciates: ACL and PCL are intact. Collaterals: Medial collateral ligament is intact. Lateral collateral ligament complex is intact. CARTILAGE Patellofemoral: Advanced articular cartilage thinning. No evidence of focal full-thickness defect. Medial: Advanced articular cartilage thinning without evidence of full-thickness defect. Lateral: Advanced articular cartilage thinning without evidence of full-thickness defect. JOINT: Small suprapatellar joint effusion. Normal Hoffa's fat-pad. No plical thickening. POPLITEAL FOSSA: Popliteus tendon is intact. No Baker's cyst. EXTENSOR MECHANISM: Intact quadriceps tendon. Intact patellar tendon. Intact lateral patellar retinaculum. Intact medial patellar retinaculum. Intact MPFL. BONES: No aggressive osseous lesion. No fracture or dislocation. Other: No fluid collection or hematoma. Generalized muscle atrophy. Mild subcutaneous soft tissue edema. IMPRESSION: 1. Small joint effusion, likely reactive. No abnormal enhancement or soft tissue abnormality concerning for septic arthritis. 2. Generalized articular cartilage thinning without evidence of full-thickness  defect. No evidence of ligamentous injury. 3. The marrow signal is within normal limits. No evidence of fracture or osteonecrosis. Electronically Signed   By: Keane Police D.O.   On: 09/15/2021 16:49   DG CHEST PORT 1 VIEW  Result Date: 09/17/2021 CLINICAL DATA:  Weakness EXAM: PORTABLE CHEST 1 VIEW COMPARISON:  09/15/2021 FINDINGS: Moderate size left-sided pleural effusion, stable to slightly increased from prior. Airspace opacity persists within the left perihilar region and left lung base. Similar right infrahilar and right basilar opacities. No pneumothorax. Stable cardiomegaly with aortic atherosclerosis. Interval placement of right-sided PICC line with distal tip terminating at the level of the superior cavoatrial junction. IMPRESSION: 1. Interval placement of right-sided PICC line with distal tip terminating at the level of the superior cavoatrial junction. 2. Moderate size left-sided pleural effusion, stable to slightly increased from prior. 3. Persistent bilateral airspace opacities, left greater than right. Electronically Signed   By: Davina Poke D.O.   On: 09/17/2021 12:26   [place] EKG SITE RITE  Result Date: 09/16/2021 If Site Rite image not attached, placement could not be confirmed due to current cardiac rhythm.      Assessment/Plan: Functional deficits secondary  to Debility from MSSA bactermia/endovascular repair of aortic leak; PE and Previous styage IIIB lung CA with new hepatic and bone metastases-  ELOS- 12-14 days-supervision. Patient is able to shower if cover PICC line and abd incision 2. DVT/PE- Lovenox 70 mg BID- until CT in 4 weeks by Vascular 3. Pain Management: Tylenola nd Percocet as needed for pain- having a lot of LUE/LLE and back pain- might need titration of pain meds.  4. Mood:- will con't pt seeing Palliative care due to his cancer and ego support as required- might need to see neuropsychology for assistance.  5. Skin/Wound Care: Routine skin checks- will take  off Air bed, because transfers are harder on air mattress and mointor skin closely. Groin wounds appear healed over.  6. . Fluids/Electrolytes/Nutrition: Routine in and outs with follow-up  7. MSSA bacteremia- is on Cefazolin q8 hours for 4 weeks from 09/09/21- ID said to follow up thoracentesis culture results- are pending.  8. Persistent Afib- is  recent diagnosis- Metoprolol just increased to 75 mg BID and on Diltiazem 120 mg daily- monitor for hypotension and hold meds if BP <100.  9. S/p aortic leak/endovascular repair- periaortic hematoma- vascular to reorder at CT in 4 weeks to follow up 10. Previous Stage IIIB Lung CA- with new hepatic and bone mets- likely Stage IV- pallaitve care has gotten involved- is now DNR- F/U with Oncology and maybe hospice after d/c.  11. Constipation- acute nursing to try and get pt cleaned out before admission to rehab- if not, order Sorbitol to get him to go.  12. PICC line for IV ABX- as of 09/17/21 13. PE and R ileofemoral DVT- on Tx dose Lovenox- until Vascular feels can change to oral agent- minimum 4 weeks from endovascular repair.   Patient Active Problem List   Diagnosis Date Noted   Malnutrition of moderate degree 09/16/2021   Bacteremia    Pleural effusion 09/10/2021   Pleural effusion on left 09/09/2021   Symptomatic anemia 09/09/2021   Hyponatremia 09/09/2021   Persistent atrial fibrillation ([place]) 09/09/2021   Pulmonary embolism ([place]) 09/09/2021   Protein-calorie malnutrition, severe ([place]) 09/09/2021   Encounter for antineoplastic immunotherapy 04/19/2021   Neutropenia ([place]) 03/14/2021   Non-small cell carcinoma of left lung, stage 3 ([place]) 01/12/2021   Encounter for antineoplastic chemotherapy 01/12/2021   Lung mass 12/29/2020    Mikel Hardgrove 09/17/2021, 1:04 PM

## 2021-09-17 NOTE — Progress Notes (Signed)
Mobility Specialist Criteria Algorithm Info.   09/17/21 1208  Mobility  Activity Transferred to/from [facility]  Range of Motion/Exercises Active;Passive  Level of Assistance Maximum assist, patient does 25-49%  Assistive Device [facility]  Mobility Out of bed for toileting  Mobility Response Tolerated well  Mobility performed by Mobility specialist  Bed Position Semi-fowlers   Patient received in bed requesting assistance to [facility]. Required mod A to EOB and max A to squat pivot to and from [facility]. Tolerated well and was left with RN/NT present.   09/17/2021 1:26 PM

## 2021-09-17 NOTE — Plan of Care (Addendum)
Patient arrived unit via bed with staff escort.  Alert and oriented x 4. Denies any pain or discomfort.  Lungs clear bilaterally on auscultation and diminished in the bases.  Weak left upper and lower extremities.  Abdominal surgical scar noted with no dressing and no drainage.  Right groin incision intact with no drainage noted.  Generalized 3+ edema noted to upper and lower extremities; concentrated mostly to abdomen, bilateral hips and lower legs.  Continent x 2. Had a bowel movement on arrival to unit.  Has a Single PICC line to right upper arm.

## 2021-09-17 NOTE — Progress Notes (Signed)
PHARMACY CONSULT NOTE FOR:  OUTPATIENT  PARENTERAL ANTIBIOTIC THERAPY (OPAT)  Indication: MSSA bacteremia Regimen: Cefazolin 2g IV every 8 hours End date: 10/08/21  IV antibiotic discharge orders are pended. To discharging provider:  please sign these orders via discharge navigator,  Select New Orders & click on the button choice - Manage This Unsigned Work.     Thank you for allowing pharmacy to be a part of this patient's care.  Antonietta Jewel, PharmD, [place] Clinical Pharmacist  Phone: [phone] 09/17/2021 7:23 PM  Please check AMION for all [place] phone numbers After 10:00 PM, call [place] [phone]

## 2021-09-18 DIAGNOSIS — R5381 Other malaise: Principal | ICD-10-CM

## 2021-09-18 DIAGNOSIS — C3490 Malignant neoplasm of unspecified part of unspecified bronchus or lung: Secondary | ICD-10-CM | POA: Diagnosis not present

## 2021-09-18 MED ORDER — GABAPENTIN 300 MG PO CAPS
300.0000 mg | ORAL_CAPSULE | Freq: Every day | ORAL | Status: DC
  Administered 2021-09-18 – 2021-10-10 (×4): 300 mg via ORAL
  Filled 2021-09-18 (×11): qty 1

## 2021-09-18 NOTE — Plan of Care (Signed)
  Problem: RH Balance Goal: LTG Patient will maintain dynamic sitting balance (PT) Description: LTG:  Patient will maintain dynamic sitting balance with assistance during mobility activities (PT) Flowsheets (Taken 09/18/2021 1853) LTG: Pt will maintain dynamic sitting balance during mobility activities with:: Supervision/Verbal cueing Goal: LTG Patient will maintain dynamic standing balance (PT) Description: LTG:  Patient will maintain dynamic standing balance with assistance during mobility activities (PT) Flowsheets (Taken 09/18/2021 1853) LTG: Pt will maintain dynamic standing balance during mobility activities with:: Minimal Assistance - Patient > 75%   Problem: Sit to Stand Goal: LTG:  Patient will perform sit to stand with assistance level (PT) Description: LTG:  Patient will perform sit to stand with assistance level (PT) Flowsheets (Taken 09/18/2021 1853) LTG: PT will perform sit to stand in preparation for functional mobility with assistance level: Contact Guard/Touching assist   Problem: RH Bed Mobility Goal: LTG Patient will perform bed mobility with assist (PT) Description: LTG: Patient will perform bed mobility with assistance, with/without cues (PT). Flowsheets (Taken 09/18/2021 1853) LTG: Pt will perform bed mobility with assistance level of: Contact Guard/Touching assist   Problem: RH Bed to Chair Transfers Goal: LTG Patient will perform bed/chair transfers w/assist (PT) Description: LTG: Patient will perform bed to chair transfers with assistance (PT). Flowsheets (Taken 09/18/2021 1853) LTG: Pt will perform Bed to Chair Transfers with assistance level: Contact Guard/Touching assist   Problem: RH Car Transfers Goal: LTG Patient will perform car transfers with assist (PT) Description: LTG: Patient will perform car transfers with assistance (PT). Flowsheets (Taken 09/18/2021 1853) LTG: Pt will perform car transfers with assist:: Minimal Assistance - Patient > 75%    Problem: RH Ambulation Goal: LTG Patient will ambulate in controlled environment (PT) Description: LTG: Patient will ambulate in a controlled environment, # of feet with assistance (PT). Flowsheets (Taken 09/18/2021 1853) LTG: Pt will ambulate in controlled environ  assist needed:: Contact Guard/Touching assist LTG: Ambulation distance in controlled environment: 180ft using LRAD Goal: LTG Patient will ambulate in home environment (PT) Description: LTG: Patient will ambulate in home environment, # of feet with assistance (PT). Flowsheets (Taken 09/18/2021 1853) LTG: Pt will ambulate in home environ  assist needed:: Contact Guard/Touching assist LTG: Ambulation distance in home environment: 31ft using LRAD   Problem: RH Stairs Goal: LTG Patient will ambulate up and down stairs w/assist (PT) Description: LTG: Patient will ambulate up and down # of stairs with assistance (PT) Flowsheets (Taken 09/18/2021 1853) LTG: Pt will ambulate up/down stairs assist needed:: Minimal Assistance - Patient > 75% LTG: Pt will  ambulate up and down number of stairs: 1 steps using HRs per home set-up

## 2021-09-18 NOTE — Evaluation (Signed)
Occupational Therapy Assessment and Plan  Patient Details  Name: Leon Lyons MRN: [id number] Date of Birth: 14-Jul-1933  OT Diagnosis: muscle weakness (generalized) Rehab Potential: Rehab Potential (ACUTE ONLY): Good ELOS: ~ 3 weeks   Today's Date: 09/18/2021 OT Individual Time: 0800-0900 OT Individual Time Calculation (min): 60 min     Hospital Problem: Principal Problem:   Debility   Past Medical History:  Past Medical History:  Diagnosis Date   History of radiation therapy 03/24/2021   left lung   02/11/2021-03/24/2021  Dr Gery Pray   Lung cancer [facility])    Tobacco use    Past Surgical History:  Past Surgical History:  Procedure Laterality Date   ABDOMINAL AORTIC ENDOVASCULAR STENT GRAFT Bilateral 09/11/2021   Procedure: EVAR;  Surgeon: Angelia Mould, MD;  Location: [place];  Service: Vascular;  Laterality: Bilateral;   APPENDECTOMY     BRONCHIAL BIOPSY  01/07/2021   Procedure: BRONCHIAL BIOPSIES;  Surgeon: Garner Nash, DO;  Location: [place] [facility];  Service: Pulmonary;;   BRONCHIAL BRUSHINGS  01/07/2021   Procedure: BRONCHIAL BRUSHINGS;  Surgeon: Garner Nash, DO;  Location: [place] [facility];  Service: Pulmonary;;   BRONCHIAL NEEDLE ASPIRATION BIOPSY  01/07/2021   Procedure: BRONCHIAL NEEDLE ASPIRATION BIOPSIES;  Surgeon: Garner Nash, DO;  Location: [place];  Service: Pulmonary;;   BRONCHIAL WASHINGS  01/07/2021   Procedure: BRONCHIAL WASHINGS;  Surgeon: Garner Nash, DO;  Location: [place];  Service: Pulmonary;;   IR THORACENTESIS ASP PLEURAL SPACE W/IMG GUIDE  09/15/2021   IRRIGATION AND DEBRIDEMENT SEBACEOUS CYST     TEE WITHOUT CARDIOVERSION N/A 09/14/2021   Procedure: TRANSESOPHAGEAL ECHOCARDIOGRAM (TEE);  Surgeon: Sanda Klein, MD;  Location: [facility];  Service: Cardiovascular;  Laterality: N/A;   VIDEO BRONCHOSCOPY WITH ENDOBRONCHIAL ULTRASOUND Bilateral 01/07/2021   Procedure: VIDEO BRONCHOSCOPY WITH ENDOBRONCHIAL ULTRASOUND;   Surgeon: Garner Nash, DO;  Location: [facility];  Service: Pulmonary;  Laterality: Bilateral;  possible need for radial ultrasound and fluoro     Assessment & Plan Clinical Impression: Patient is a 87 y.o. 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  During this hospitalization, he required endovascular repair for an aortic leak and aortic bifurcation- and developed a periaortic hematoma- (Vascular to recheck CT scan in 4 weeks) to stay on Lovenox)- of note,  s/p 3 units pRBCs.  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 He was also seen by Palliative care and made DNR- he is to follow up with Oncology after discharge. Also found to have MSSA bacteremia- and on Cefazolin for 4 weeks- from 09/09/21. Of note, TTE with vegetation and TEE also no vegetation; S/P L thoracentesis with no infection seen- Cultures pending.  He had a complicated hospitalization and after everything that occurred medically, he was seen by PT and OT- found to have LE>UE weakness and have impairment in his mobility and ADLs and so therefore is being admitted to inpatient rehab for CIR. Patient transferred to CIR on 09/17/2021 .    Patient currently requires  max to total A   with basic self-care skills and basic mobility  secondary to muscle weakness, decreased cardiorespiratoy endurance, and decreased standing balance, decreased postural control, and decreased balance strategies.  Prior to hospitalization, patient could complete ADL with independent .  Patient will benefit from skilled intervention to  decrease level of assist with basic self-care skills and increase independence with basic self-care skills prior to discharge home with care partner.  Anticipate patient will require intermittent supervision and minimal physical assistance and  follow up home health.  OT - End of Session Activity Tolerance: Tolerates 10 - 20 min activity with multiple rests Endurance Deficit: Yes Endurance Deficit Description: frequent rest breaks; HR ~ 60s and O2 >95% with sit to stands OT Assessment Rehab Potential (ACUTE ONLY): Good OT Patient demonstrates impairments in the following area(s): Balance;Cognition;Edema;Endurance;Motor;Pain;Perception;Safety;Sensory;Skin Integrity OT Basic ADL's Functional Problem(s): Grooming;Bathing;Dressing;Toileting OT Transfers Functional Problem(s): Toilet;Tub/Shower OT Additional Impairment(s): Fuctional Use of Upper Extremity OT Plan OT Intensity: Minimum of 1-2 x/day, 45 to 90 minutes OT Frequency: 5 out of 7 days OT Duration/Estimated Length of Stay: ~ 3 weeks OT Treatment/Interventions: Balance/vestibular training;DME/adaptive equipment instruction;Patient/family education;Therapeutic Activities;Cognitive remediation/compensation;Functional electrical stimulation;Psychosocial support;Therapeutic Exercise;Community reintegration;Functional mobility training;Self Care/advanced ADL retraining;UE/LE Strength taining/ROM;Discharge planning;Neuromuscular re-education;Skin care/wound managment;UE/LE Coordination activities;Disease mangement/prevention;Pain management;Visual/perceptual remediation/compensation OT Self Feeding Anticipated Outcome(s): n/a OT Basic Self-Care Anticipated Outcome(s): min A OT Toileting Anticipated Outcome(s): min A OT Bathroom Transfers Anticipated Outcome(s): supervision OT Recommendation Recommendations for Other Services: Neuropsych consult Patient destination: Home Follow Up Recommendations: Home health OT Equipment Recommended: To be determined   OT Evaluation Precautions/Restrictions  Precautions Precautions: Fall Precaution Comments: watch HR and SpO2; ;eft shoulder RC injury ? Restrictions Weight Bearing Restrictions: No General Chart Reviewed:  Yes Family/Caregiver Present: Yes Vital Signs   Pain   Home Living/Prior Functioning Home Living Available Help at Discharge: Family Type of Home: House Home Access: Stairs to enter Technical brewer of Steps: 1 Home Layout: One level  Lives With: Spouse Vision Baseline Vision/History: 1 Wears glasses Ability to See in Adequate Light: 0 Adequate Patient Visual Report: No change from baseline Vision Assessment?: No apparent visual deficits Perception  Perception: Within Functional Limits Praxis Praxis: Intact Cognition Overall Cognitive Status: Within Functional Limits for tasks assessed Arousal/Alertness: Awake/alert Orientation Level: Person;Place;Situation Person: Oriented Place: Oriented Situation: Oriented Year: 2022 Month: December Day of Week: Correct Memory: Impaired Memory Impairment: Decreased recall of new information Immediate Memory Recall: Sock;Blue;Bed Memory Recall Sock: Not able to recall Memory Recall Blue: Without Cue Memory Recall Bed: With Cue Attention: Selective Selective Attention: Appears intact Awareness: Appears intact Problem Solving: Appears intact Safety/Judgment: Appears intact Sensation Sensation Light Touch: Appears Intact Proprioception: Appears Intact Coordination Gross Motor Movements are Fluid and Coordinated: No Fine Motor Movements are Fluid and Coordinated: Yes Coordination and Movement Description: decr ROM and movement in left shoulder ; Possible rotator cuff injury; Legs very week Motor  Motor Motor: Abnormal postural alignment and control Motor - Skilled Clinical Observations: singificant weakness  Trunk/Postural Assessment  Cervical Assessment Cervical Assessment:  (forward head) Thoracic Assessment Thoracic Assessment:  (rounded shoulders) Lumbar Assessment Lumbar Assessment:  (posterior pelvic tilt) Postural Control Postural Control: Deficits on evaluation Trunk Control: supervision to contact guard in  sitting Righting Reactions: delayed; in standing pt with posterior lean  Balance Balance Balance Assessed: Yes Static Sitting Balance Static Sitting - Balance Support: Feet unsupported Static Sitting - Level of Assistance: 5: Stand by assistance Dynamic Sitting Balance Dynamic Sitting - Balance Support: During functional activity Dynamic Sitting - Level of Assistance: 4: Min assist Static Standing Balance Static Standing - Balance Support: During functional activity Static Standing - Level of Assistance: 2: Max assist Dynamic Standing Balance Dynamic Standing - Level of Assistance: 1: +2 Total assist Extremity/Trunk Assessment RUE Assessment RUE Assessment: Within Functional Limits General Strength  Comments: generalized weakness 3+/5 LUE Assessment Active Range of Motion (AROM) Comments: decr shoulder flexion to ~ 60degrees, elbow and wrist and hand WFL General Strength Comments: 2/5  Care Tool Care Tool Self Care Eating   Eating Assist Level: Set up assist    Oral Care    Oral Care Assist Level: Set up assist    Bathing   Body parts bathed by patient: Right arm;Left arm;Chest;Face Body parts bathed by helper: Abdomen;Front perineal area;Buttocks;Right upper leg;Left upper leg;Right lower leg;Left lower leg   Assist Level: Moderate Assistance - Patient 50 - 74%    Upper Body Dressing(including orthotics)   What is the patient wearing?: Pull over shirt   Assist Level: Maximal Assistance - Patient 25 - 49%    Lower Body Dressing (excluding footwear)   What is the patient wearing?: Incontinence brief;Pants Assist for lower body dressing: Total Assistance - Patient < 25%    Putting on/Taking off footwear   What is the patient wearing?: Shoes;Non-skid slipper socks Assist for footwear: Total Assistance - Patient < 25%       Care Tool Toileting Toileting activity   Assist for toileting: Total Assistance - Patient < 25%     Care Tool Bed Mobility Roll left and right  activity    Min A     Sit to lying activity   Sit to lying assist level: Maximal Assistance - Patient 25 - 49%    Lying to sitting on side of bed activity    Max A      Care Tool Transfers Sit to stand transfer   Sit to stand assist level: Maximal Assistance - Patient 25 - 49%    Chair/bed transfer   Chair/bed transfer assist level: Maximal Assistance - Patient 25 - 49%     Toilet transfer   Assist Level: Maximal Assistance - Patient 24 - 49%     Care Tool Cognition  Expression of Ideas and Wants Expression of Ideas and Wants: 4. Without difficulty (complex and basic) - expresses complex messages without difficulty and with speech that is clear and easy to understand  Understanding Verbal and Non-Verbal Content Understanding Verbal and Non-Verbal Content: 4. Understands (complex and basic) - clear comprehension without cues or repetitions   Memory/Recall Ability     Refer to Care Plan for Long Term Goals  SHORT TERM GOAL WEEK 1 OT Short Term Goal 1 (Week 1): Pt will perform sit to stand with mod A 50% of time without the STEDY for clothing mangement OT Short Term Goal 2 (Week 1): Pt will be able to transfer with mod A 50% of the time to the toilet/ BSC OT Short Term Goal 3 (Week 1): PT will be able to thread LB clothing with min A with AE prn  Recommendations for other services: Neuropsych   Skilled Therapeutic Intervention Ot eval initiated with OT purpose, role and goals discussed with pt and pt's wife (who came in a little after we got started with therapy). Pt does demonstrate swallowing breathing with activity but sats remain over 95% on RA. Pt required max A to come to EOB (on the left side). PT with significant dec endurance and was only able to sit EOB for ~ 5-8 min while bathing UB before reporting his back was getting tired. Pt attempted to stand with RW (what he was using at home prior to admit) and was successful on the 3rd attempt but with difficulty with terminal  hip and knee extension. Pt required  max A to remain standing.a to return to sitting EOB. Max A squat pivot transfers (with two squats) to get into w/c at a slight elevated bed and chair height. In the chair pt continued to participate in bathing and dressing. Pt reports hurting his left shoulder in the yard a while back and continues to have some pain and decr left shoulder ROM. Pt required total A for LB dressing. Pt did stand at the sink with max A for therapist to pull up pants. Pt does need frequent rest breaks. Pt brushed teeth and hair. Therapist assisting with washing his hair.   Left sitting up int he w/c with his wife present.   ADL ADL Eating: Independent Grooming: Setup Where Assessed-Grooming: Sitting at sink Upper Body Bathing: Moderate assistance Where Assessed-Upper Body Bathing: Edge of bed Lower Body Bathing: Maximal assistance Where Assessed-Lower Body Bathing: Wheelchair Upper Body Dressing: Moderate assistance Where Assessed-Upper Body Dressing: Wheelchair Lower Body Dressing: Other (Comment) (Total A) Toileting: Dependent Toilet Transfer Method: Squat pivot Tub/Shower Transfer: Maximal assistance Mobility  Bed Mobility Bed Mobility: Left Sidelying to Sit;Supine to Sit;Sitting - Scoot to Edge of Bed Left Sidelying to Sit: Maximal Assistance - Patient 25-49% Supine to Sit: Maximal Assistance - Patient - Patient 25-49% Sitting - Scoot to Edge of Bed: Maximal Assistance - Patient 25-49% Transfers Sit to Stand: Maximal Assistance - Patient 25-49% Stand to Sit: Maximal Assistance - Patient 25-49%   Discharge Criteria: Patient will be discharged from OT if patient refuses treatment 3 consecutive times without medical reason, if treatment goals not met, if there is a change in medical status, if patient makes no progress towards goals or if patient is discharged from hospital.  The above assessment, treatment plan, treatment alternatives and goals were discussed and  mutually agreed upon: by patient and by family  Nicoletta Ba 09/18/2021, 12:22 PM

## 2021-09-18 NOTE — Progress Notes (Signed)
Inpatient Rehabilitation Admission Medication Review by a Pharmacist  A complete drug regimen review was completed for this patient to identify any potential clinically significant medication issues.  High Risk Drug Classes Is patient taking? Indication by Medication  Antipsychotic No   Anticoagulant Yes Enoxaparin - PE and DVT  Antibiotic Yes Cefazolin - MSSA Bacteremia  Opioid Yes Oxycodone-acetaminophen prn - moderate pain  Antiplatelet No   Hypoglycemics/insulin No   Vasoactive Medication Yes Diltiazem - Afib Metoprolol - Afib  Chemotherapy No   Other Yes Flexeril - pain/muscle spasm     Type of Medication Issue Identified Description of Issue Recommendation(s)  Drug Interaction(s) (clinically significant)     Duplicate Therapy     Allergy     No Medication Administration End Date  IV Cefazolin does not have end date of 12/31 Add IV Cefazolin end date OPAT orders have been entered already  Incorrect Dose     Additional Drug Therapy Needed  PTA gabapentin has not been re-ordered Re-start as able and appropriate for patient; If discontinuing, alert patient and caregivers  Significant med changes from prior encounter (inform family/care partners about these prior to discharge).    Other       Clinically significant medication issues were identified that warrant physician communication and completion of prescribed/recommended actions by midnight of the next day:  No  Name of provider notified for urgent issues identified: Courtney Heys, MD  Provider Method of Notification: secure chat  Time spent performing this drug regimen review (minutes):  20 minutes  Thank you for involving pharmacy in this patient's care.  Elita Quick, PharmD PGY1 Ambulatory Care Pharmacy Resident 09/18/2021 12:37 PM  **Pharmacist phone directory can be found on [email or web address] listed under [facility]**

## 2021-09-18 NOTE — Progress Notes (Signed)
Occupational Therapy Session Note  Patient Details  Name: MASASHI SNOWDON MRN: [id number] Date of Birth: 23-Feb-1933  Today's Date: 09/19/2021 OT Individual Time: 9741-6384 and 1133-1200 OT Individual Time Calculation (min): 60 min and 27 min  Short Term Goals: Week 1:  OT Short Term Goal 1 (Week 1): Pt will perform sit to stand with mod A 50% of time without the STEDY for clothing mangement OT Short Term Goal 2 (Week 1): Pt will be able to transfer with mod A 50% of the time to the toilet/ BSC OT Short Term Goal 3 (Week 1): PT will be able to thread LB clothing with min A with AE prn  Skilled Therapeutic Interventions/Progress Updates:    Pt greeted in bed, premedicated for Lt sided pain. He was agreeable to engage in BADLs EOB at sit<stand level using RW. Max A for supine<sit with bed flat, without using the bedrail per setup at home. Pt did well maintaining unsupported sitting balance at supervision-Min A level consistently throughout ADL. Had a lot of trouble weight shifting forward to meet demands of LB self care and also when transitioning into standing using RW for support. Manual cues for upright posture in standing while wife assisted with perihygiene, brief, and pants. Mod A to boost into standing from elevated bed. OT donned Teds due to LE swelling. +2 for squat pivot<w/c with OT sitting in chair and facilitating anterior weight shift while wife assisted with pivoting pt at pelvis. Pt was set up to comb his hair. He remained sitting up at close of session, all needs within reach and wife April present.   2nd Session 1:1 tx (27 min) Pt greeted sitting in the w/c, feeling good and ready to go. Worked on core strength/anterior weight shifting for functional carryover during self care tasks and sit<stands. Pt participated in anterior reaches to target outside of base of support and also flexion/extension of trunk while holding OT's hands. Pt easily fatigued and required several rest breaks  during participation. Transitioned to using the gait belt for bilateral hamstring stretching. Strongly encouraged pt to complete LE exercises (ankle pumps, long arc quads, and seated marches) as much as possible for edema control. Discussed with next PT about getting ELRs for his w/c. Pt remained sitting up, agreeable to remain sitting up for PT session right after lunch.   Therapy Documentation Precautions:  Precautions Precautions: Fall, Other (comment) Precaution Comments: monitor BP, HR, and SpO2; possible L shoulder RC injury ? Restrictions Weight Bearing Restrictions: No Pain: Pain Assessment Pain Score: 0-No pain ADL: ADL Eating: Independent Grooming: Setup Where Assessed-Grooming: Sitting at sink Upper Body Bathing: Moderate assistance Where Assessed-Upper Body Bathing: Edge of bed Lower Body Bathing: Maximal assistance Where Assessed-Lower Body Bathing: Wheelchair Upper Body Dressing: Moderate assistance Where Assessed-Upper Body Dressing: Wheelchair Lower Body Dressing: Other (Comment) (Total A) Toileting: Dependent Toilet Transfer Method: Squat pivot Tub/Shower Transfer: Maximal assistance   Therapy/Group: Individual Therapy  Daielle Melcher A Coriana Angello 09/19/2021, 12:49 PM

## 2021-09-18 NOTE — Progress Notes (Signed)
PROGRESS NOTE   Subjective/Complaints:  Got cleaned out with 3 suppositories yesterday before admission.  Having a lot of pain on L side of body- 1 pain pill helped al ot this AM.    ROS:  Pt denies SOB, abd pain, CP, N/V/C/D, and vision changes   Objective:   DG CHEST PORT 1 VIEW  Result Date: 09/17/2021 CLINICAL DATA:  Weakness EXAM: PORTABLE CHEST 1 VIEW COMPARISON:  09/15/2021 FINDINGS: Moderate size left-sided pleural effusion, stable to slightly increased from prior. Airspace opacity persists within the left perihilar region and left lung base. Similar right infrahilar and right basilar opacities. No pneumothorax. Stable cardiomegaly with aortic atherosclerosis. Interval placement of right-sided PICC line with distal tip terminating at the level of the superior cavoatrial junction. IMPRESSION: 1. Interval placement of right-sided PICC line with distal tip terminating at the level of the superior cavoatrial junction. 2. Moderate size left-sided pleural effusion, stable to slightly increased from prior. 3. Persistent bilateral airspace opacities, left greater than right. Electronically Signed   By: Davina Poke D.O.   On: 09/17/2021 12:26   Recent Labs    09/17/21 0507  WBC 7.7  HGB 10.5*  HCT 33.0*  PLT 208   No results for input(s): NA, K, CL, CO2, GLUCOSE, BUN, CREATININE, CALCIUM in the last 72 hours.  Intake/Output Summary (Last 24 hours) at 09/18/2021 1254 Last data filed at 09/18/2021 0900 Gross per 24 hour  Intake 230 ml  Output 900 ml  Net -670 ml        Physical Exam: Vital Signs Blood pressure 109/64, pulse 97, temperature 98.6 F (37 C), temperature source Oral, resp. rate 20, height 5\' 10"  (1.778 m), weight 79.5 kg, SpO2 90 %.   General: awake, alert, appropriate, sitting up in bed; off O2 currently; NAD HENT: conjugate gaze; oropharynx moist CV: regular rate; no JVD Pulmonary: decreased at  bases B/L- no W/R/R GI: soft, NT, ND, (+)BS Psychiatric: appropriate; interactive Neurological: alert Musculoskeletal:     Cervical back: Normal range of motion. No rigidity.     Comments: MS: RUE- 5-/5 in deltoid, biceps, triceps, WE, grip and FA LUE- deltoid 2-/5; weak vs pain; Biceps 4-/5; Triceps 3-/5; WE 4-/5 grip 4-/5; FA 4-/5 RLE- 4+/5 in HF, KE, KF DF and PF LLE- Proximal 3+/5; distal 4-/5  Skin:    General: Skin is warm and dry.     Comments: Heels OK Abd dressing as above B/L groins healed over- trace pinkness A LOT of arm ecchymoses/bruising noted Blanchable redness on backside with healed over/scarred spots- previous pimples? L thoracentesis spot on posterior thoracic spine- bandaid covering LUE 2 IV-s look OK PICC line RUE- looks OK   Neurological:     Comments: Intact to light touch in all 4 extremities B/L  CN's intact on exam Ox3- appropriate   Assessment/Plan: 1. Functional deficits which require 3+ hours per day of interdisciplinary therapy in a comprehensive inpatient rehab setting. Physiatrist is providing close team supervision and 24 hour management of active medical problems listed below. Physiatrist and rehab team continue to assess barriers to discharge/monitor patient progress toward functional and medical goals  Care Tool:  Bathing  Body parts bathed by patient: Right arm, Left arm, Chest, Face   Body parts bathed by helper: Abdomen, Front perineal area, Buttocks, Right upper leg, Left upper leg, Right lower leg, Left lower leg     Bathing assist Assist Level: Moderate Assistance - Patient 50 - 74%     Upper Body Dressing/Undressing Upper body dressing   What is the patient wearing?: Pull over shirt    Upper body assist Assist Level: Maximal Assistance - Patient 25 - 49%    Lower Body Dressing/Undressing Lower body dressing      What is the patient wearing?: Incontinence brief, Pants     Lower body assist Assist for lower body  dressing: Total Assistance - Patient < 25%     Toileting Toileting    Toileting assist Assist for toileting: Total Assistance - Patient < 25%     Transfers Chair/bed transfer  Transfers assist     Chair/bed transfer assist level: Maximal Assistance - Patient 25 - 49%     Locomotion Ambulation   Ambulation assist              Walk 10 feet activity   Assist           Walk 50 feet activity   Assist           Walk 150 feet activity   Assist           Walk 10 feet on uneven surface  activity   Assist           Wheelchair     Assist               Wheelchair 50 feet with 2 turns activity    Assist            Wheelchair 150 feet activity     Assist          Blood pressure 109/64, pulse 97, temperature 98.6 F (37 C), temperature source Oral, resp. rate 20, height 5\' 10"  (1.778 m), weight 79.5 kg, SpO2 90 %.  Assessment/Plan: Functional deficits secondary  to Debility from MSSA bactermia/endovascular repair of aortic leak; PE and Previous styage IIIB lung CA with new hepatic and bone metastases-  ELOS- 12-14 days-supervision. Patient is able to shower if cover PICC line and abd incision 12/11- first day of evaluations- PT and OT- CIR 2. DVT/PE- Lovenox 70 mg BID- until CT in 4 weeks by Vascular 3. Pain Management: Tylenola nd Percocet as needed for pain- having a lot of LUE/LLE and back pain- might need titration of pain meds.   12/11- pt reports pain controlled when gets percocet- is q4 hours prn- might need something scheduled? Restarted Gabapentin 300 mg QHS- home dose.  4. Mood:- will con't pt seeing Palliative care due to his cancer and ego support as required- might need to see neuropsychology for assistance.  5. Skin/Wound Care: Routine skin checks- will take off Air bed, because transfers are harder on air mattress and mointor skin closely. Groin wounds appear healed over.  6. .  Fluids/Electrolytes/Nutrition: Routine in and outs with follow-up  7. MSSA bacteremia- is on Cefazolin q8 hours for 4 weeks from 09/09/21- ID said to follow up thoracentesis culture results- are pending.  8. Persistent Afib- is recent diagnosis- Metoprolol just increased to 75 mg BID and on Diltiazem 120 mg daily- monitor for hypotension and hold meds if BP <100.  9. S/p aortic leak/endovascular repair- periaortic hematoma- vascular to reorder at CT in 4  weeks to follow up 10. Previous Stage IIIB Lung CA- with new hepatic and bone mets- likely Stage IV- pallaitve care has gotten involved- is now DNR- F/U with Oncology and maybe hospice after d/c.  11. Constipation- acute nursing to try and get pt cleaned out before admission to rehab- if not, order Sorbitol to get him to go.   12/11- cleaned out- con't regimen 12. PICC line for IV ABX- as of 09/17/21 13. PE and R ileofemoral DVT- on Tx dose Lovenox- until Vascular feels can change to oral agent- minimum 4 weeks from endovascular repair.             LOS: 1 days A FACE TO FACE EVALUATION WAS PERFORMED  Leon Lyons 09/18/2021, 12:54 PM

## 2021-09-18 NOTE — H&P (Signed)
Physical Medicine and Rehabilitation Admission H&P CC: generalized weakness   HPI: Leon Lyons is an 87 y.o. 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  During this hospitalization, he required endovascular repair for an aortic leak and aortic bifurcation- and developed a periaortic hematoma- (Vascular to recheck CT scan in 4 weeks) to stay on Lovenox)- of note,  s/p 3 units pRBCs.  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 He was also seen by Palliative care and made DNR- he is to follow up with Oncology after discharge. Also found to have MSSA bacteremia- and on Cefazolin for 4 weeks- from 09/09/21. Of note, TTE with vegetation and TEE also no vegetation; S/P L thoracentesis with no infection seen- Cultures pending.  He had a complicated hospitalization and after everything that occurred medically, he was seen by PT and OT- found to have LE>UE weakness and have impairment in his mobility and ADLs and so therefore is being admitted to inpatient rehab for CIR.   Currently, he reports he feels very weak since in hospital- LBM was 8 days ago- nursing trying to get him to have BM prior to CIR admission. He's voiding OK.  Is passing gas- and usually goes every 3-4 days.  Having pain- back pain and L side- shoulder, LUE, knee and hip all hurt- meds helping pain.  Denies SOB- but breathes shallow per pt- is "normal for him".      Review of Systems  Constitutional:  Positive for fatigue. Negative for fever and unexpected weight change.  HENT: Negative.  Negative for facial swelling, postnasal drip and sinus pressure.   Eyes: Negative.   Respiratory:  Negative for cough, shortness of breath and wheezing.   Cardiovascular:  Positive for leg swelling. Negative for chest pain.  Gastrointestinal:  Positive for constipation.  Negative for blood in stool, diarrhea and nausea.  Endocrine: Negative.   Genitourinary:  Negative for difficulty urinating, frequency and hematuria.  Musculoskeletal:  Positive for arthralgias, back pain and myalgias. Negative for neck pain.  Skin: Negative.   Allergic/Immunologic: Negative.   Neurological:  Positive for weakness. Negative for light-headedness, numbness and headaches.  Hematological: Negative.   Psychiatric/Behavioral:  Negative for confusion and sleep disturbance. The patient is not nervous/anxious.   All other systems reviewed and are negative.     Past Medical History:  Diagnosis Date   History of radiation therapy 03/24/2021    left lung   02/11/2021-03/24/2021  Dr Gery Pray   Lung cancer [facility])     Tobacco use           Past Surgical History:  Procedure Laterality Date   ABDOMINAL AORTIC ENDOVASCULAR STENT GRAFT Bilateral 09/11/2021    Procedure: EVAR;  Surgeon: Angelia Mould, MD;  Location: [facility];  Service: Vascular;  Laterality: Bilateral;   APPENDECTOMY       BRONCHIAL BIOPSY   01/07/2021    Procedure: BRONCHIAL BIOPSIES;  Surgeon: Garner Nash, DO;  Location: [facility];  Service: Pulmonary;;   BRONCHIAL BRUSHINGS   01/07/2021    Procedure: BRONCHIAL BRUSHINGS;  Surgeon: Garner Nash, DO;  Location: [facility];  Service: Pulmonary;;   BRONCHIAL NEEDLE ASPIRATION BIOPSY   01/07/2021    Procedure: BRONCHIAL NEEDLE ASPIRATION BIOPSIES;  Surgeon: Garner Nash, DO;  Location: [facility];  Service: Pulmonary;;   BRONCHIAL  WASHINGS   01/07/2021    Procedure: BRONCHIAL WASHINGS;  Surgeon: Garner Nash, DO;  Location: [facility];  Service: Pulmonary;;   IR THORACENTESIS ASP PLEURAL SPACE W/IMG GUIDE   09/15/2021   IRRIGATION AND DEBRIDEMENT SEBACEOUS CYST       TEE WITHOUT CARDIOVERSION N/A 09/14/2021    Procedure: TRANSESOPHAGEAL ECHOCARDIOGRAM (TEE);  Surgeon: Sanda Klein, MD;  Location: [facility];  Service: Cardiovascular;   Laterality: N/A;   VIDEO BRONCHOSCOPY WITH ENDOBRONCHIAL ULTRASOUND Bilateral 01/07/2021    Procedure: VIDEO BRONCHOSCOPY WITH ENDOBRONCHIAL ULTRASOUND;  Surgeon: Garner Nash, DO;  Location: [facility];  Service: Pulmonary;  Laterality: Bilateral;  possible need for radial ultrasound and fluoro          Family History  Problem Relation Age of Onset   Hypertension Other      Social History:  reports that he quit smoking about 60 years ago. His smoking use included cigarettes. He has never used smokeless tobacco. No history on file for alcohol use and drug use. Allergies:       Allergies  Allergen Reactions   Diclofenac Sodium Other (See Comments)      Kidneys were affected          Medications Prior to Admission  Medication Sig Dispense Refill   acetaminophen (TYLENOL) 500 MG tablet Take 500-1,000 mg by mouth every 8 (eight) hours as needed (for pain).       Cholecalciferol (VITAMIN D3 SUPER STRENGTH) 50 MCG (2000 UT) CAPS Take 4,000 Units by mouth daily.       Dextromethorphan-guaiFENesin 5-100 MG/5ML LIQD Take 5 mLs by mouth every 6 (six) hours as needed (for cough or congestion).       diltiazem (CARDIZEM) 120 MG tablet Take 120 mg by mouth daily.       enoxaparin (LOVENOX) 80 MG/0.8ML injection Inject 70 mg into the skin 2 (two) times daily.       gabapentin (NEURONTIN) 300 MG capsule Take 300 mg by mouth at bedtime.       [DISCONTINUED] Amino Acids-Protein Hydrolys (FEEDING SUPPLEMENT, PRO-STAT SUGAR FREE 64,) LIQD Take 15 mLs by mouth in the morning and at bedtime.       [DISCONTINUED] VOLTAREN 1 % GEL Apply 2 g topically See admin instructions. Apply 2 grams of gel to the shoulders, legs, or other painful sites 2 times a day as needed for pain       Multiple Vitamin (MULTIVITAMIN WITH MINERALS) TABS tablet Take 1 tablet by mouth once a week. (Patient not taking: Reported on 09/09/2021)       Omega-3 Fatty Acids (FISH OIL ULTRA) 1400 MG CAPS Take 1,400 mg by mouth once a week.  (Patient not taking: Reported on 09/09/2021)       [DISCONTINUED] Ascorbic Acid (VITAMIN C) 1000 MG tablet Take 1,000 mg by mouth once a week. (Patient not taking: Reported on 03/08/2021)       [DISCONTINUED] lidocaine (XYLOCAINE) 2 % solution Mix 1 part 2% viscous lidocaine,1 part H2O. Swallow 52mL of diluted mixture, 54min before meals, up to QID. Use PRN soreness (Patient not taking: Reported on 09/09/2021) 100 mL 3   [DISCONTINUED] Magnesium 400 MG CAPS Take 400 mg by mouth once a week. (Patient not taking: Reported on 09/09/2021)       [DISCONTINUED] metoprolol succinate (TOPROL-XL) 25 MG 24 hr tablet Take 25 mg by mouth See admin instructions. Take 25 mg by mouth at bedtime and hold if Systolic number is <222 or heart rate  is <65 (Patient not taking: Reported on 09/09/2021)       [DISCONTINUED] polyethylene glycol (MIRALAX / GLYCOLAX) 17 g packet Take 17 g by mouth daily. (Patient taking differently: Take 17 g by mouth daily as needed for mild constipation (mix and drink).) 14 each 0   [DISCONTINUED] prochlorperazine (COMPAZINE) 10 MG tablet Take 1 tablet (10 mg total) by mouth every 6 (six) hours as needed for nausea or vomiting. (Patient not taking: Reported on 09/09/2021) 30 tablet 0   [DISCONTINUED] sucralfate (CARAFATE) 1 g tablet Dissolve 1 tablet in 47mL H2O and swallow up to QID, PRN sore throat. (Patient not taking: Reported on 09/09/2021) 40 tablet 2   [DISCONTINUED] traMADol (ULTRAM) 50 MG tablet Take by mouth every 6 (six) hours as needed. (Patient not taking: Reported on 09/09/2021)       [DISCONTINUED] vitamin E 180 MG (400 UNITS) capsule Take 400 Units by mouth once a week. (Patient not taking: Reported on 09/09/2021)          Drug Regimen Review  Drug regimen was reviewed and remains appropriate with no significant issues identified     Home: Home Living Family/patient expects to be discharged to:: Private residence Living Arrangements: Spouse/significant other Available Help at  Discharge: Family Type of Home: House Home Access: Stairs to enter Technical brewer of Steps: 1 Home Layout: One level Home Equipment: Conservation officer, nature (2 wheels), Rollator (4 wheels) Additional Comments: reports his wife ordered a shower chair online recently   Functional History: Prior Function Prior Level of Function : Needs assist Physical Assist : ADLs (physical) ADLs (physical): Bathing, IADLs Mobility Comments: pt reports recently using RW for mobility ADLs Comments: reports recent assist with sponge bathing from wife. typically Independent with ADLs, standing for showers and enjoyed working out in yard   Functional Status:  Mobility: Bed Mobility Overal bed mobility: Needs Assistance Bed Mobility: Sit to Supine Rolling: Supervision Sidelying to sit: Mod assist, HOB elevated, Max assist Supine to sit: Mod assist, HOB elevated Sit to supine: Max assist, +2 for physical assistance Sit to sidelying: Mod assist General bed mobility comments: 2 person assistance to manage stedy equipment and guide pts. trunk and b les into bed Transfers Overall transfer level: Needs assistance Equipment used: Ambulation equipment used Transfers: Bed to chair/wheelchair/BSC, Sit to/from Stand Sit to Stand: +2 physical assistance, Mod assist Bed to/from chair/wheelchair/BSC transfer type:: Via Lift equipment  Lateral/Scoot Transfers: Max Barrister's clerk via Lift Equipment: Stedy General transfer comment: Heavy mod A +2 to stand from stedy onto eob; cues for upright posture and knee extension; able to stand 10-15 seconds. Mod A +2 for eccentric control lowering to eob from chair.   ADL: ADL Overall ADL's : Needs assistance/impaired Eating/Feeding: Set up, Sitting Grooming: Set up, Sitting Upper Body Bathing: Minimal assistance, Sitting Upper Body Bathing Details (indicate cue type and reason): to bathe back sitting EOB Lower Body Bathing: Maximal assistance, Sitting/lateral leans Upper  Body Dressing : Minimal assistance, Sitting Lower Body Dressing: Maximal assistance, Sitting/lateral leans Lower Body Dressing Details (indicate cue type and reason): attempted cross legs over each knee, pt. lifted up and said "thats it cant do it".  encouraged to try to cross leg over and he said "thats it".  pointed to wife and states "she does that for me".  but also reported he was able to perform LB dressing Toilet Transfer: +2 for safety/equipment, Minimal assistance (used stedy) Toilet Transfer Details (indicate cue type and reason): simulated with use of stedy from  chair to bed 2 person assist for guiding pt. and equipement safely Toileting- Clothing Manipulation and Hygiene: Maximal assistance, Sitting/lateral lean, Bed level General ADL Comments: limited progression as pt. for adls secondary to pt. stating he was unable to attempt lb adls.  utilized stedy equipment for transfer back to bed pt. reporting he is unable to stand without assistance noting LUE limited function along with LLE   Cognition: Cognition Overall Cognitive Status: Within Functional Limits for tasks assessed Orientation Level: Oriented X4 Cognition Arousal/Alertness: Awake/alert Behavior During Therapy: WFL for tasks assessed/performed Overall Cognitive Status: Within Functional Limits for tasks assessed General Comments: Pt agreeable to therapy today without encouragement. Stated he knows he needs to do it.     Blood pressure 119/76, pulse 100, temperature 97.6 F (36.4 C), temperature source Oral, resp. rate 19, height 5\' 10"  (1.778 m), weight 75.3 kg, SpO2 96 %. Physical Exam Vitals and nursing note reviewed. Exam conducted with a chaperone present.  Constitutional:      Comments: Pt sitting up in bed; a little frail, but appears younger than stated age; needed assistance to turn; on air bed for back pain; nursing in room most of visit, NAD; on monitor  HENT:     Head: Normocephalic and atraumatic.      Comments: Smile equal    Right Ear: External ear normal.     Left Ear: External ear normal.     Nose: Nose normal. No congestion.     Mouth/Throat:     Mouth: Mucous membranes are dry.     Pharynx: Oropharynx is clear. No oropharyngeal exudate.  Eyes:     General:        Right eye: No discharge.        Left eye: No discharge.     Extraocular Movements: Extraocular movements intact.  Cardiovascular:     Rate and Rhythm: Normal rate. Rhythm irregular.     Heart sounds: Normal heart sounds. No murmur heard.   No gallop.  Pulmonary:     Comments: Slightly decreased at bases L>R- otherwise no W/R/R- clear B/L  Abdominal:     Comments: Abdominal incision- C/D/I- small midline vertical incision Firmish; NT; somewhat distended vs protuberant; hypoactive BS  Musculoskeletal:     Cervical back: Normal range of motion. No rigidity.     Comments: MS: RUE- 5-/5 in deltoid, biceps, triceps, WE, grip and FA LUE- deltoid 2-/5; weak vs pain; Biceps 4-/5; Triceps 3-/5; WE 4-/5 grip 4-/5; FA 4-/5 RLE- 4+/5 in HF, KE, KF DF and PF LLE- Proximal 3+/5; distal 4-/5  Skin:    General: Skin is warm and dry.     Comments: Heels OK Abd dressing as above B/L groins healed over- trace pinkness A LOT of arm ecchymoses/bruising noted Blanchable redness on backside with healed over/scarred spots- previous pimples? L thoracentesis spot on posterior thoracic spine- bandaid covering LUE 2 IV-s look OK PICC line RUE- looks OK   Neurological:     Comments: Intact to light touch in all 4 extremities B/L  CN's intact on exam Ox3- appropriate  Psychiatric:     Comments: Sad, slightly flat affect; appropriate      Lab Results Last 48 Hours        Results for orders placed or performed during the hospital encounter of 09/09/21 (from the past 48 hour(s))  CBC     Status: Abnormal    Collection Time: 09/17/21  5:07 AM  Result Value Ref Range    WBC  7.7 4.0 - 10.5 K/uL    RBC 3.48 (L) 4.22 - 5.81 MIL/uL     Hemoglobin 10.5 (L) 13.0 - 17.0 g/dL    HCT 33.0 (L) 39.0 - 52.0 %    MCV 94.8 80.0 - 100.0 fL    MCH 30.2 26.0 - 34.0 pg    MCHC 31.8 30.0 - 36.0 g/dL    RDW 17.2 (H) 11.5 - 15.5 %    Platelets 208 150 - 400 K/uL    nRBC 0.0 0.0 - 0.2 %      Comment: Performed at [facility] [address] [place], [place] [address]       Imaging Results (Last 48 hours)  MR KNEE LEFT W WO CONTRAST   Result Date: 09/15/2021 CLINICAL DATA:  Staphylococcus bacteremia. Tenderness along the suprapatellar area. Left knee pain. EXAM: MRI OF THE LEFT KNEE WITHOUT AND WITH CONTRAST TECHNIQUE: Multiplanar, multisequence MR imaging of the left knee was performed both before and after administration of intravenous contrast. CONTRAST:  30mL GADAVIST GADOBUTROL 1 MMOL/ML IV SOLN COMPARISON:  Radiographs dated September 10, 2021 FINDINGS: The exam is limited due to motion. MENISCI Medial: Degenerative changes without evidence of acute tear Lateral: Degenerative changes without evidence of acute tear LIGAMENTS Cruciates: ACL and PCL are intact. Collaterals: Medial collateral ligament is intact. Lateral collateral ligament complex is intact. CARTILAGE Patellofemoral: Advanced articular cartilage thinning. No evidence of focal full-thickness defect. Medial: Advanced articular cartilage thinning without evidence of full-thickness defect. Lateral: Advanced articular cartilage thinning without evidence of full-thickness defect. JOINT: Small suprapatellar joint effusion. Normal Hoffa's fat-pad. No plical thickening. POPLITEAL FOSSA: Popliteus tendon is intact. No Baker's cyst. EXTENSOR MECHANISM: Intact quadriceps tendon. Intact patellar tendon. Intact lateral patellar retinaculum. Intact medial patellar retinaculum. Intact MPFL. BONES: No aggressive osseous lesion. No fracture or dislocation. Other: No fluid collection or hematoma. Generalized muscle atrophy. Mild subcutaneous soft tissue edema. IMPRESSION: 1. Small joint effusion,  likely reactive. No abnormal enhancement or soft tissue abnormality concerning for septic arthritis. 2. Generalized articular cartilage thinning without evidence of full-thickness defect. No evidence of ligamentous injury. 3. The marrow signal is within normal limits. No evidence of fracture or osteonecrosis. Electronically Signed   By: Keane Police D.O.   On: 09/15/2021 16:49    DG CHEST PORT 1 VIEW   Result Date: 09/17/2021 CLINICAL DATA:  Weakness EXAM: PORTABLE CHEST 1 VIEW COMPARISON:  09/15/2021 FINDINGS: Moderate size left-sided pleural effusion, stable to slightly increased from prior. Airspace opacity persists within the left perihilar region and left lung base. Similar right infrahilar and right basilar opacities. No pneumothorax. Stable cardiomegaly with aortic atherosclerosis. Interval placement of right-sided PICC line with distal tip terminating at the level of the superior cavoatrial junction. IMPRESSION: 1. Interval placement of right-sided PICC line with distal tip terminating at the level of the superior cavoatrial junction. 2. Moderate size left-sided pleural effusion, stable to slightly increased from prior. 3. Persistent bilateral airspace opacities, left greater than right. Electronically Signed   By: Davina Poke D.O.   On: 09/17/2021 12:26    [place] EKG SITE RITE   Result Date: 09/16/2021 If Site Rite image not attached, placement could not be confirmed due to current cardiac rhythm.          Assessment/Plan: Functional deficits secondary  to Debility from MSSA bactermia/endovascular repair of aortic leak; PE and Previous styage IIIB lung CA with new hepatic and bone metastases-  ELOS- 12-14 days-supervision. Patient is able to shower if  cover PICC line and abd incision 2. DVT/PE- Lovenox 70 mg BID- until CT in 4 weeks by Vascular 3. Pain Management: Tylenola nd Percocet as needed for pain- having a lot of LUE/LLE and back pain- might need titration of pain meds.  4.  Mood:- will con't pt seeing Palliative care due to his cancer and ego support as required- might need to see neuropsychology for assistance.  5. Skin/Wound Care: Routine skin checks- will take off Air bed, because transfers are harder on air mattress and mointor skin closely. Groin wounds appear healed over.  6. . Fluids/Electrolytes/Nutrition: Routine in and outs with follow-up  7. MSSA bacteremia- is on Cefazolin q8 hours for 4 weeks from 09/09/21- ID said to follow up thoracentesis culture results- are pending.  8. Persistent Afib- is recent diagnosis- Metoprolol just increased to 75 mg BID and on Diltiazem 120 mg daily- monitor for hypotension and hold meds if BP <100.  9. S/p aortic leak/endovascular repair- periaortic hematoma- vascular to reorder at CT in 4 weeks to follow up 10. Previous Stage IIIB Lung CA- with new hepatic and bone mets- likely Stage IV- pallaitve care has gotten involved- is now DNR- F/U with Oncology and maybe hospice after d/c.  11. Constipation- acute nursing to try and get pt cleaned out before admission to rehab- if not, order Sorbitol to get him to go.  12. PICC line for IV ABX- as of 09/17/21 13. PE and R ileofemoral DVT- on Tx dose Lovenox- until Vascular feels can change to oral agent- minimum 4 weeks from endovascular repair.        Patient Active Problem List    Diagnosis Date Noted   Malnutrition of moderate degree 09/16/2021   Bacteremia     Pleural effusion 09/10/2021   Pleural effusion on left 09/09/2021   Symptomatic anemia 09/09/2021   Hyponatremia 09/09/2021   Persistent atrial fibrillation ([place]) 09/09/2021   Pulmonary embolism ([place]) 09/09/2021   Protein-calorie malnutrition, severe ([place]) 09/09/2021   Encounter for antineoplastic immunotherapy 04/19/2021   Neutropenia ([place]) 03/14/2021   Non-small cell carcinoma of left lung, stage 3 ([place]) 01/12/2021   Encounter for antineoplastic chemotherapy 01/12/2021   Lung mass 12/29/2020      Juda Lajeunesse 09/17/2021, 1:04 PM

## 2021-09-18 NOTE — Evaluation (Signed)
Physical Therapy Assessment and Plan  Patient Details  Name: Leon Lyons MRN: [id number] Date of Birth: [birth year]  PT Diagnosis: Abnormal posture, Abnormality of gait, Difficulty walking, Edema, Muscle weakness, and Pain in back Rehab Potential: Good ELOS: ~3-3.5 weeks   Today's Date: 09/18/2021 PT Individual Time: 9326-7124 PT Individual Time Calculation (min): 64 min    Hospital Problem: Principal Problem:   Debility   Past Medical History:  Past Medical History:  Diagnosis Date   History of radiation therapy 03/24/2021   left lung   02/11/2021-03/24/2021  Dr Gery Pray   Lung cancer [facility])    Tobacco use    Past Surgical History:  Past Surgical History:  Procedure Laterality Date   ABDOMINAL AORTIC ENDOVASCULAR STENT GRAFT Bilateral 09/11/2021   Procedure: EVAR;  Surgeon: Angelia Mould, MD;  Location: [place];  Service: Vascular;  Laterality: Bilateral;   APPENDECTOMY     BRONCHIAL BIOPSY  01/07/2021   Procedure: BRONCHIAL BIOPSIES;  Surgeon: Garner Nash, DO;  Location: [place] [facility];  Service: Pulmonary;;   BRONCHIAL BRUSHINGS  01/07/2021   Procedure: BRONCHIAL BRUSHINGS;  Surgeon: Garner Nash, DO;  Location: [place] [facility];  Service: Pulmonary;;   BRONCHIAL NEEDLE ASPIRATION BIOPSY  01/07/2021   Procedure: BRONCHIAL NEEDLE ASPIRATION BIOPSIES;  Surgeon: Garner Nash, DO;  Location: [place];  Service: Pulmonary;;   BRONCHIAL WASHINGS  01/07/2021   Procedure: BRONCHIAL WASHINGS;  Surgeon: Garner Nash, DO;  Location: [place];  Service: Pulmonary;;   IR THORACENTESIS ASP PLEURAL SPACE W/IMG GUIDE  09/15/2021   IRRIGATION AND DEBRIDEMENT SEBACEOUS CYST     TEE WITHOUT CARDIOVERSION N/A 09/14/2021   Procedure: TRANSESOPHAGEAL ECHOCARDIOGRAM (TEE);  Surgeon: Sanda Klein, MD;  Location: [place];  Service: Cardiovascular;  Laterality: N/A;   VIDEO BRONCHOSCOPY WITH ENDOBRONCHIAL ULTRASOUND Bilateral 01/07/2021   Procedure: VIDEO BRONCHOSCOPY  WITH ENDOBRONCHIAL ULTRASOUND;  Surgeon: Garner Nash, DO;  Location: [place];  Service: Pulmonary;  Laterality: Bilateral;  possible need for radial ultrasound and fluoro     Assessment & Plan Clinical Impression: Patient is a 87 y.o.  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  During this hospitalization, he required endovascular repair for an aortic leak and aortic bifurcation- and developed a periaortic hematoma- (Vascular to recheck CT scan in 4 weeks) to stay on Lovenox)- of note,  s/p 3 units pRBCs.  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 He was also seen by Palliative care and made DNR- he is to follow up with Oncology after discharge. Also found to have MSSA bacteremia- and on Cefazolin for 4 weeks- from 09/09/21. Of note, TTE with vegetation and TEE also no vegetation; S/P L thoracentesis with no infection seen- Cultures pending.  He had a complicated hospitalization and after everything that occurred medically, he was seen by PT and OT- found to have LE>UE weakness and have impairment in his mobility and ADLs and so therefore is being admitted to inpatient rehab for CIR.Patient transferred to CIR on 09/17/2021 .   Patient currently requires max assist with mobility secondary to muscle weakness and muscle joint tightness, decreased cardiorespiratoy endurance, impaired timing and sequencing and unbalanced muscle activation, decreased initiation and delayed processing, and decreased sitting balance, decreased standing balance, decreased postural control, and decreased balance strategies.  Prior to hospitalization, patient was modified independent  (  recently started using RW since Oct 2022) with mobility and lived with Spouse in a House home.  Home access is 1Stairs to enter.  Patient will benefit from  skilled PT intervention to maximize safe functional mobility, minimize fall risk, and decrease caregiver burden for planned discharge home with 24 hour assist.  Anticipate patient will benefit from follow up [facility] at discharge.  PT - End of Session Activity Tolerance: Tolerates 30+ min activity with multiple rests Endurance Deficit: Yes Endurance Deficit Description: frequent rest breaks with elevated HR during sit>stand PT Assessment Rehab Potential (ACUTE/IP ONLY): Good PT Barriers to Discharge: Inaccessible home environment;Nutrition means;Pending chemo/radiation PT Patient demonstrates impairments in the following area(s): Balance;Perception;Behavior;Safety;Edema;Sensory;Endurance;Skin Integrity;Motor;Nutrition;Pain PT Transfers Functional Problem(s): Bed Mobility;Bed to Chair;Car;Furniture PT Locomotion Functional Problem(s): Ambulation;Wheelchair Mobility;Stairs PT Plan PT Intensity: Minimum of 1-2 x/day ,45 to 90 minutes PT Frequency: 5 out of 7 days PT Duration Estimated Length of Stay: ~3-3.5 weeks PT Treatment/Interventions: Ambulation/gait training;Community reintegration;DME/adaptive equipment instruction;Neuromuscular re-education;Psychosocial support;Stair training;UE/LE Strength taining/ROM;Wheelchair propulsion/positioning;Balance/vestibular training;Discharge planning;Pain management;Functional electrical stimulation;Skin care/wound management;Therapeutic Activities;UE/LE Coordination activities;Cognitive remediation/compensation;Disease management/prevention;Functional mobility training;Patient/family education;Splinting/orthotics;Therapeutic Exercise;Visual/perceptual remediation/compensation PT Transfers Anticipated Outcome(s): CGA using LRAD PT Locomotion Anticipated Outcome(s): CGA using LRAD PT Recommendation Recommendations for Other Services: Neuropsych consult Follow Up Recommendations: Home health PT;24 hour supervision/assistance Patient destination: Home Equipment  Recommended: To be determined   PT Evaluation Precautions/Restrictions Precautions Precautions: Fall;Other (comment) Precaution Comments: monitor BP, HR, and SpO2; possible L shoulder RC injury ? Restrictions Weight Bearing Restrictions: No Vital Signs Details below. Pain Pain Assessment Pain Scale: 0-10 Pain Score: 0-No pain Pain Interference Pain Interference Pain Effect on Sleep: 2. Occasionally Pain Interference with Therapy Activities: 3. Frequently Pain Interference with Day-to-Day Activities: 3. Frequently Home Living/Prior Functioning Home Living Available Help at Discharge: Family;Available 24 hours/day (wife, April 24hr support - feels comfortable providing min assist) Type of Home: House Home Access: Stairs to enter [employer] of Steps: 1 Entrance Stairs-Rails: None Home Layout: One level  Lives With: Spouse (April) Prior Function Level of Independence: Independent with transfers;Independent with gait;Requires assistive device for independence;Other (comment) (could ambulate mod-I using RW PTA - pt's wife reports that since abdominal surgery in Oct 2022 pt has had significant decline in functional mobility) Driving: Yes (last drove before October) Vision/Perception  Vision - History Ability to See in Adequate Light: 0 Adequate Perception Perception: Within Functional Limits Praxis Praxis: Intact  Cognition Overall Cognitive Status: Within Functional Limits for tasks assessed Arousal/Alertness: Awake/alert Orientation Level: Oriented to person;Oriented to place;Disoriented to time;Oriented to situation (stated it was "12/22" confusing year and date) Year: 2022 Month: December Day of Week: Correct Attention: Focused;Sustained Focused Attention: Appears intact Sustained Attention: Appears intact Selective Attention: Appears intact Memory: Impaired Safety/Judgment: Appears intact Sensation Sensation Light Touch: Appears Intact Hot/Cold: Not  tested Proprioception: Impaired by gross assessment (unaware of bilateral knees partially flexed in standing) Stereognosis: Not tested Coordination Gross Motor Movements are Fluid and Coordinated: No Coordination and Movement Description: GM movements impaired due to generalized weakness and deconditioning with L LE more impaired than R LE Motor  Motor Motor: Abnormal postural alignment and control;Other (comment) Motor - Skilled Clinical Observations: singificant generalized weakness and deconditioning   Trunk/Postural Assessment  Cervical Assessment Cervical Assessment: Exceptions to [facility] (forward head) Thoracic Assessment Thoracic Assessment: Exceptions to [facility] (rounded shoulders) Lumbar Assessment Lumbar Assessment: Exceptions to [facility] (posterior pelvic tilt in sitting) Postural Control Postural Control: Deficits on evaluation Trunk Control: supervision to contact guard in sitting Righting Reactions: delayed; posterior lean in  standing  Balance Balance Balance Assessed: Yes Static Sitting Balance Static Sitting - Balance Support: Feet supported Static Sitting - Level of Assistance: 5: Stand by assistance;Other (comment) (CGA) Dynamic Sitting Balance Dynamic Sitting - Balance Support: During functional activity;Feet supported Dynamic Sitting - Level of Assistance: 4: Min assist Static Standing Balance Static Standing - Balance Support: During functional activity;Bilateral upper extremity supported Static Standing - Level of Assistance: 2: Max assist Dynamic Standing Balance Dynamic Standing - Balance Support: During functional activity Dynamic Standing - Level of Assistance: Other (comment) (+2 mod assist) Extremity Assessment      RLE Assessment RLE Assessment: Exceptions to [facility] Active Range of Motion (AROM) Comments: WFL RLE Strength Right Hip Flexion: 3/5 Right Knee Flexion: 3+/5 Right Knee Extension: 4-/5 Right Ankle Dorsiflexion: 4-/5 Right Ankle Plantar Flexion:  4-/5 LLE Assessment LLE Assessment: Exceptions to [facility] Passive Range of Motion (PROM) Comments: lacks full L ankle DF ROM & reports discomfort upon therapist attempts at stretching into DF LLE Strength Left Hip Flexion: 2+/5 Left Knee Flexion: 3/5 Left Knee Extension: 3-/5 Left Ankle Dorsiflexion: 2-/5 Left Ankle Plantar Flexion: 3-/5  Care Tool Care Tool Bed Mobility Roll left and right activity   Roll left and right assist level: Moderate Assistance - Patient 50 - 74%    Sit to lying activity   Sit to lying assist level: Maximal Assistance - Patient 25 - 49%    Lying to sitting on side of bed activity   Lying to sitting on side of bed assist level: the ability to move from lying on the back to sitting on the side of the bed with no back support.: Maximal Assistance - Patient 25 - 49%     Care Tool Transfers Sit to stand transfer   Sit to stand assist level: Maximal Assistance - Patient 25 - 49% Sit to stand assistive device: Walker  Chair/bed transfer   Chair/bed transfer assist level: Maximal Assistance - Patient 25 - 49% Chair/bed transfer assistive device: [employer]chant [employer] officer transfer activity did not occur: Safety/medical concerns        Care Tool Locomotion Ambulation Ambulation activity did not occur: Safety/medical concerns        Walk 10 feet activity Walk 10 feet activity did not occur: Safety/medical concerns       Walk 50 feet with 2 turns activity Walk 50 feet with 2 turns activity did not occur: Safety/medical concerns      Walk 150 feet activity Walk 150 feet activity did not occur: Safety/medical concerns      Walk 10 feet on uneven surfaces activity Walk 10 feet on uneven surfaces activity did not occur: Safety/medical concerns      Stairs Stair activity did not occur: Safety/medical concerns        Walk up/down 1 step activity Walk up/down 1 step or curb (drop down) activity did not occur:  Safety/medical concerns      Walk up/down 4 steps activity Walk up/down 4 steps activity did not occur: Safety/medical concerns      Walk up/down 12 steps activity Walk up/down 12 steps activity did not occur: Safety/medical concerns      Pick up small objects from floor   Pick up small object from the floor assist level: Dependent - Patient 0%    Wheelchair Is the patient using a wheelchair?: No          Wheel 50 feet with 2 turns  activity      Wheel 150 feet activity        Refer to Care Plan for Long Term Goals  SHORT TERM GOAL WEEK 1 PT Short Term Goal 1 (Week 1): Pt will perform supine<>sit with min assist PT Short Term Goal 2 (Week 1): Pt will perform sit<>stands using LRAD with min assist PT Short Term Goal 3 (Week 1): Pt will perform bed<>chair transfers using LRAD with min assist PT Short Term Goal 4 (Week 1): Pt will ambulate at least 50f using LRAD with mod assist and +2 w/c follow if needed  Recommendations for other services: Neuropsych  Skilled Therapeutic Intervention Pt received supine in bed with his wife, April, present and pt eager to participate in therapy session. Evaluation completed (see details above) with patient education regarding purpose of PT evaluation, PT POC and goals, therapy schedule, weekly team meetings, and other CIR information including safety plan and fall risk safety. Pt performed the below functional mobility tasks with the specified levels of assistance. Attempted sit>stand from lowered EOB, but pt unable to power up therefore elevated EOB to a more regular seat height and pt able to rise with heavy mod assist for lifting to stand and balance due to mild posterior lean as well as L knee guarding but no buckling noted - requires visual demonstration and verbal cuing to perform and sustain anterior trunk lean/weight shift to lift hips with some carryover noted throughout session - cuing for proper UE placement when using AD. Noticed more  labored breathing and elevated HR during standing - vital sign details below. After seated rest break, returned to standing with BUE support on RW and performed static marching in place with light mod assist for balance - guarding/blocking L knee for safety but no true buckling -cuing for R/L weight shift onto stance limb while sustaining knee extended on that LE and lifting contralateral LE. Stand pivot w/c<>EOB using RW with heavy mod assist for balance and AD management while turning - step-by-step cuing for sequencing AD and LE stepping - pt has progressively more crouched/flexed posture with soft knees requiring repeated cuing to stand up tall during transfer. At end of session pt left supine in bed with needs in reach, bed alarm on, his wife present, and B LEs elevated.   Vitals: (on room air throughout) Supine at beginning: BP 98/62 (MAP 73), HR 60-90bpm, SpO2 91%  In sitting: BP 105/62 (MAP 77), HR 87bpm, SpO2 92% with pt reporting he feels "a little bit" lightheaded After standing: BP 108/71 (MAP 82), HR 152bpm quickly decreasing to 102bpm within 20 seconds, SpO2 90%    Mobility Bed Mobility Bed Mobility: Supine to Sit;Sit to Supine Supine to Sit: Moderate Assistance - Patient 50-74%;Maximal Assistance - Patient - Patient 25-49% (cuing for logroll technique to increase independence - towards R EOB) Sitting - Scoot to Edge of Bed: Moderate Assistance - Patient 50-74%;Maximal Assistance - Patient 25-49% (cuing for head/hips relationship) Sit to Supine: Moderate Assistance - Patient 50-74%;Maximal Assistance - Patient 25-49% (cuing for reverse logroll technique with assist primarily for B LE management onto bed) Transfers Transfers: Sit to Stand;Stand to Sit;Stand Pivot Transfers Sit to Stand: Moderate Assistance - Patient 50-74%;Maximal Assistance - Patient 25-49% Stand to Sit: Moderate Assistance - Patient 50-74%;Maximal Assistance - Patient 25-49% Stand Pivot Transfers: Moderate Assistance  - Patient 50 - 74%;Maximal Assistance - Patient 25 - 49% Stand Pivot Transfer Details: Tactile cues for sequencing;Tactile cues for weight shifting;Tactile cues for posture;Verbal cues for  gait pattern;Verbal cues for technique;Verbal cues for precautions/safety;Verbal cues for safe use of DME/AE;Manual facilitation for weight shifting;Visual cues/gestures for sequencing;Tactile cues for initiation;Verbal cues for sequencing Transfer (Assistive device): Rolling walker Locomotion  Gait Ambulation: No Stairs / Additional Locomotion Stairs: No Wheelchair Mobility Wheelchair Mobility: No   Discharge Criteria: Patient will be discharged from PT if patient refuses treatment 3 consecutive times without medical reason, if treatment goals not met, if there is a change in medical status, if patient makes no progress towards goals or if patient is discharged from hospital.  The above assessment, treatment plan, treatment alternatives and goals were discussed and mutually agreed upon: by patient and by family  Tawana Scale , PT, DPT, NCS, CSRS  09/18/2021, 3:12 PM

## 2021-09-19 ENCOUNTER — Encounter (HOSPITAL_COMMUNITY): Payer: Self-pay | Admitting: Physical Medicine & Rehabilitation

## 2021-09-19 ENCOUNTER — Other Ambulatory Visit: Payer: Self-pay

## 2021-09-19 DIAGNOSIS — C61 Malignant neoplasm of prostate: Secondary | ICD-10-CM

## 2021-09-19 DIAGNOSIS — R600 Localized edema: Secondary | ICD-10-CM

## 2021-09-19 DIAGNOSIS — R5381 Other malaise: Secondary | ICD-10-CM | POA: Diagnosis not present

## 2021-09-19 LAB — CYTOLOGY - NON PAP

## 2021-09-19 MED ORDER — HEPARIN SOD (PORK) LOCK FLUSH 100 UNIT/ML IV SOLN
250.0000 [IU] | INTRAVENOUS | Status: DC | PRN
  Filled 2021-09-19: qty 2.5

## 2021-09-19 NOTE — Progress Notes (Signed)
Physical Therapy Session Note  Patient Details  Name: Leon Lyons MRN: [id number] Date of Birth: [birth year]  Today's Date: 09/19/2021 PT Individual Time: 1430-1530 PT Individual Time Calculation (min): 60 min   Short Term Goals: Week 1:  PT Short Term Goal 1 (Week 1): Pt will perform supine<>sit with min assist PT Short Term Goal 2 (Week 1): Pt will perform sit<>stands using LRAD with min assist PT Short Term Goal 3 (Week 1): Pt will perform bed<>chair transfers using LRAD with min assist PT Short Term Goal 4 (Week 1): Pt will ambulate at least 26ft using LRAD with mod assist and +2 w/c follow if needed  Skilled Therapeutic Interventions/Progress Updates: Pt presents sitting in w/c w/ LEs elevated on leg rests, but very short and knees are still flexed w/ feet hanging.  Attempted to locate longer ELRs but only found 3 R leg rests.  Pt performed calf raises 3 x 10-15, LAQ 3 x 8-10 w/ bracing of upper leg to increase ext., RLE greater strength than L, hip flexion.  Pt requires seated rest breaks 2/2 fatigue.  Pt offers frustration w/ CLOF, but wants to get better.  Spouse present and supportive.  Pt performed multiple sit to stand transfers w/ split UE placement 2/2 possible L RC injury.  Pt requires max cueing for sequencing, especially w/ forward lean and foot placement as well as max A.  Once standing pt requires verbal cues for knee extension, use of UEs  and upright posture and then decreased assistance to min.  Pt performed 2 steps forward and back w/ flexing knees and required assist to return to sitting safely as knees buckle.  Pt performed partial squats x 3 before unable to come to upright posture and assisted lowering into w/c safely.  Pt performed step-pivot to bed w/ RW and max A.  Pt w/ difficulty turning RW to transfer to bed and assisted by spouse, w/ buckling knees.  PT assisted lowering to bed .  Pt scooted self back into bed and then max A for LEs to perform reverse log roll  technique.  Pt rolled side to side for Chux repositioning w/ min A and verbal cues for flexion of knee and sequencing of log roll.  Spouse present and appreciative of education to assist w/ rolling.  Pt TED hose removed and heels elevated off bed for edema relief.  Bed alarm on and all needs in reach.     Therapy Documentation Precautions:  Precautions Precautions: Fall, Other (comment) Precaution Comments: monitor BP, HR, and SpO2; possible L shoulder RC injury ? Restrictions Weight Bearing Restrictions: No General:   Vital Signs: Therapy Vitals Temp: (!) 97.4 F (36.3 C) Temp Source: Oral Pulse Rate: 78 Resp: 19 BP: (!) 93/52 Patient Position (if appropriate): Sitting Oxygen Therapy SpO2: 95 % O2 Device: Room Air Pain:no c/o, although w/ exertion, c/o some LBP. Pain Assessment Pain Score: 0-No pain Mobility:       Therapy/Group: Individual Therapy  [name] 09/19/2021, 3:36 PM

## 2021-09-19 NOTE — Progress Notes (Signed)
Inpatient Rehabilitation Care Coordinator Assessment and Plan Patient Details  Name: Leon Lyons MRN: [id number] Date of Birth: Dec 05, 1932  Today's Date: 09/19/2021  Hospital Problems: Principal Problem:   Debility  Past Medical History:  Past Medical History:  Diagnosis Date   History of radiation therapy 03/24/2021   left lung   02/11/2021-03/24/2021  Dr Gery Pray   Lung cancer [facility])    Tobacco use    Past Surgical History:  Past Surgical History:  Procedure Laterality Date   ABDOMINAL AORTIC ENDOVASCULAR STENT GRAFT Bilateral 09/11/2021   Procedure: EVAR;  Surgeon: Angelia Mould, MD;  Location: [place];  Service: Vascular;  Laterality: Bilateral;   APPENDECTOMY     BRONCHIAL BIOPSY  01/07/2021   Procedure: BRONCHIAL BIOPSIES;  Surgeon: Garner Nash, DO;  Location: [place] ENDOSCOPY;  Service: Pulmonary;;   BRONCHIAL BRUSHINGS  01/07/2021   Procedure: BRONCHIAL BRUSHINGS;  Surgeon: Garner Nash, DO;  Location: [place] ENDOSCOPY;  Service: Pulmonary;;   BRONCHIAL NEEDLE ASPIRATION BIOPSY  01/07/2021   Procedure: BRONCHIAL NEEDLE ASPIRATION BIOPSIES;  Surgeon: Garner Nash, DO;  Location: [place];  Service: Pulmonary;;   BRONCHIAL WASHINGS  01/07/2021   Procedure: BRONCHIAL WASHINGS;  Surgeon: Garner Nash, DO;  Location: [place];  Service: Pulmonary;;   IR THORACENTESIS ASP PLEURAL SPACE W/IMG GUIDE  09/15/2021   IRRIGATION AND DEBRIDEMENT SEBACEOUS CYST     TEE WITHOUT CARDIOVERSION N/A 09/14/2021   Procedure: TRANSESOPHAGEAL ECHOCARDIOGRAM (TEE);  Surgeon: Sanda Klein, MD;  Location: [place];  Service: Cardiovascular;  Laterality: N/A;   VIDEO BRONCHOSCOPY WITH ENDOBRONCHIAL ULTRASOUND Bilateral 01/07/2021   Procedure: VIDEO BRONCHOSCOPY WITH ENDOBRONCHIAL ULTRASOUND;  Surgeon: Garner Nash, DO;  Location: [place];  Service: Pulmonary;  Laterality: Bilateral;  possible need for radial ultrasound and fluoro    Social History:  reports that  he quit smoking about 60 years ago. His smoking use included cigarettes. He has never used smokeless tobacco. He reports that he does not currently use alcohol. He reports that he does not use drugs.  Family / Support Systems Marital Status: Married How Long?: 30 years Patient Roles: Spouse Spouse/Significant Other: Leon Lyons (wife): [phone] Children: no children Other Supports: None reported Anticipated Caregiver: pt wife Ability/Limitations of Caregiver: None reported Caregiver Availability: 24/7 Family Dynamics: Pt lives at home with his wife.  Social History Preferred language: English Religion:  Cultural Background: Pt is currently retired Programme researcher, broadcasting/film/video at [employer]. Education: some college- 2 yrs of business college (accounting/finance) Health Literacy - How often do you need to have someone help you when you read instructions, pamphlets, or other written material from your doctor or pharmacy?: Never Writes: Yes Employment Status: Retired Date Retired/Disabled/Unemployed: Engineer, water. Retired in 1998 Age Retired: 64 Public relations account executive Issues: Denies Guardian/Conservator: N/A   Abuse/Neglect Abuse/Neglect Assessment Can Be Completed: Yes Physical Abuse: Denies Verbal Abuse: Denies Sexual Abuse: Denies Exploitation of patient/patient's resources: Denies Self-Neglect: Denies  Patient response to: Social Isolation - How often do you feel lonely or isolated from those around you?: Never  Emotional Status Pt's affect, behavior and adjustment status: Pt in good spirits at time of visit Recent Psychosocial Issues: Denies Psychiatric History: Denies Substance Abuse History: Pt reports he quit smoking cigarettes 60 yrs ago. Denies EtoH and Rec drug use.  Patient / Family Perceptions, Expectations & Goals Pt/Family understanding of illness & functional limitations: pt and family have a generla understanding of pt care needs. Premorbid  pt/family roles/activities: Independent Anticipated changes in roles/activities/participation:  Assistance with ADLs/IADLs Pt/family expectations/goals: Pt goal is to be as independent as possible.  Community Resources Express Scripts: None Premorbid Home Care/DME Agencies: None Transportation available at discharge: wife Leon Lyons Is the patient able to respond to transportation needs?: Yes In the past 12 months, has lack of transportation kept you from medical appointments or from getting medications?: No In the past 12 months, has lack of transportation kept you from meetings, work, or from getting things needed for daily living?: No Resource referrals recommended: Neuropsychology  Discharge Planning Living Arrangements: Spouse/significant other Support Systems: Spouse/significant other Type of Residence: Private residence Insurance underwriter Resources: [employer], Multimedia programmer (specify) Nurse, mental health) Financial Resources: Fish farm manager, Other (Comment) (retirement/savings) Financial Screen Referred: Yes Living Expenses: Own Money Management: Spouse Does the patient have any problems obtaining your medications?: No Home Management: Pt wife manages all home care needs at this time. Pt was managing yardwork. Patient/Family Preliminary Plans: Wife to assume all roles. Care Coordinator Barriers to Discharge: Decreased caregiver support, Lack of/limited family support Care Coordinator Anticipated Follow Up Needs: HH/OP Expected length of stay: 3-3.5 weeks  Clinical Impression SW met with pt and pt wife Leon Lyons in room to introduce self, explain role, and discuss discharge process. 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 Pt does not use any VA benefits. HCPOA- wife Leon Lyons. DME: Cornelious Bryant 09/19/2021, 3:47 PM

## 2021-09-19 NOTE — Care Management (Signed)
Inpatient Rehabilitation Center Individual Statement of Services  Patient Name:  Leon Lyons  Date:  36/[id number]/9476  Welcome to the [facility].  Our goal is to provide you with an individualized program based on your diagnosis and situation, designed to meet your specific needs.  With this comprehensive rehabilitation program, you will be expected to participate in at least 3 hours of rehabilitation therapies Monday-Friday, with modified therapy programming on the weekends.  Your rehabilitation program will include the following services:  Physical Therapy (PT), Occupational Therapy (OT), 24 hour per day rehabilitation nursing, Therapeutic Recreaction (TR), Psychology, Neuropsychology, Care Coordinator, Rehabilitation Medicine, [place], and Other  Weekly team conferences will be held on Tuesdays to discuss your progress.  Your Inpatient Rehabilitation Care Coordinator will talk with you frequently to get your input and to update you on team discussions.  Team conferences with you and your family in attendance may also be held.  Expected length of stay: 3-3.5 weeks  Overall anticipated outcome: Minimal Assistance  Depending on your progress and recovery, your program may change. Your Inpatient Rehabilitation Care Coordinator will coordinate services and will keep you informed of any changes. Your Inpatient Rehabilitation Care Coordinator's name and contact numbers are listed  below.  The following services may also be recommended but are not provided by the [facility] will be made to provide these services after discharge if needed.  Arrangements include referral to agencies that provide these services.  Your insurance has been verified to be:  Medicare A/B  Your primary doctor is:   [name]  Pertinent information will be shared with your doctor and your insurance company.  Inpatient Rehabilitation Care Coordinator:  Cathleen Corti [phone] or ([phone]  Information discussed with and copy given to patient by: [name], 09/19/2021, 9:13 AM

## 2021-09-19 NOTE — Progress Notes (Signed)
Physical Therapy Session Note  Patient Details  Name: Leon Lyons MRN: [id number] Date of Birth: Oct 30, 1932  Today's Date: 09/19/2021 PT Individual Time: 9450-3888 PT Individual Time Calculation (min): 54 min   Short Term Goals: Week 1:  PT Short Term Goal 1 (Week 1): Pt will perform supine<>sit with min assist PT Short Term Goal 2 (Week 1): Pt will perform sit<>stands using LRAD with min assist PT Short Term Goal 3 (Week 1): Pt will perform bed<>chair transfers using LRAD with min assist PT Short Term Goal 4 (Week 1): Pt will ambulate at least 39ft using LRAD with mod assist and +2 w/c follow if needed  Skilled Therapeutic Interventions/Progress Updates:     Pt received seated in [facility] and agrees to therapy. Reports pain in low back. No number provided. PT provides mobility and rest breaks as needed to manage pain. WC transport to gym for time management. Pt performs sit to stand transfer with heavy modA. Pt quickly fatigues and says that he "needs to sit down". Stand pivot transfer to Nustep with modA/maxA and cues for sequencing and positioning. Vitals assessed after having taken rest break. HR 88 and O2 92%-95%. Pt completes Nustep for strength and endurance training. Pt completes at workload of 4 with average steps per minute 40-45. After 5:00 vitals assessed again. HR in low 100s and O2 ~85%. Pt cued for pursed lip breathing to optimize oxygen sats and slow HR. After extended seated rest break, pt performs additional Nustep training, at workload of 3, this time with interval training. Pt completes in 1:00 intervals with 1:00 rest break in between. Average steps per minute ~45. PT utilizes orange theraband to assist with foot placement on step plates. X5 1:00 intervals. PT unable to assess oxygen sats and HR following as vitals cart no longer available, but pt verbalizes improved level of exertion relative to first 5:00 bout, and does not appear as SOB. Squat pivot to [facility] with heavy maxA due  to very poor initiation from pt and minimal activation of lower extremities. Pt left seated in WC with bilateral lower extremities elevated and all needs within reach.  Therapy Documentation Precautions:  Precautions Precautions: Fall, Other (comment) Precaution Comments: monitor BP, HR, and SpO2; possible L shoulder RC injury ? Restrictions Weight Bearing Restrictions: No   Therapy/Group: Individual Therapy  Breck Coons, PT, DPT 09/19/2021, 2:37 PM

## 2021-09-19 NOTE — Progress Notes (Signed)
Inpatient Rehabilitation  Patient information reviewed and entered into eRehab system by Myeisha Kruser Lizbet Cirrincione, OTR/L.   Information including medical coding, functional ability and quality indicators will be reviewed and updated through discharge.    

## 2021-09-19 NOTE — Progress Notes (Signed)
PROGRESS NOTE   Subjective/Complaints:  Pt up in bed. In good spirits. Denies any pain. Slept well. No SOB or cough  ROS: Patient denies fever, rash, sore throat, blurred vision, nausea, vomiting, diarrhea, cough, shortness of breath or chest pain, joint or back pain, headache, or mood change.    Objective:   DG CHEST PORT 1 VIEW  Result Date: 09/17/2021 CLINICAL DATA:  Weakness EXAM: PORTABLE CHEST 1 VIEW COMPARISON:  09/15/2021 FINDINGS: Moderate size left-sided pleural effusion, stable to slightly increased from prior. Airspace opacity persists within the left perihilar region and left lung base. Similar right infrahilar and right basilar opacities. No pneumothorax. Stable cardiomegaly with aortic atherosclerosis. Interval placement of right-sided PICC line with distal tip terminating at the level of the superior cavoatrial junction. IMPRESSION: 1. Interval placement of right-sided PICC line with distal tip terminating at the level of the superior cavoatrial junction. 2. Moderate size left-sided pleural effusion, stable to slightly increased from prior. 3. Persistent bilateral airspace opacities, left greater than right. Electronically Signed   By: Davina Poke D.O.   On: 09/17/2021 12:26   Recent Labs    09/17/21 0507  WBC 7.7  HGB 10.5*  HCT 33.0*  PLT 208   No results for input(s): NA, K, CL, CO2, GLUCOSE, BUN, CREATININE, CALCIUM in the last 72 hours.  Intake/Output Summary (Last 24 hours) at 09/19/2021 1040 Last data filed at 09/19/2021 0840 Gross per 24 hour  Intake 420 ml  Output 1375 ml  Net -955 ml        Physical Exam: Vital Signs Blood pressure 109/61, pulse 92, temperature 97.9 F (36.6 C), temperature source Oral, resp. rate 18, height 5\' 10"  (1.778 m), weight 79.5 kg, SpO2 93 %.   Constitutional: No distress . Vital signs reviewed. HEENT: NCAT, EOMI, oral membranes dry Neck:  supple Cardiovascular: IRR IRR without murmur. No JVD    Respiratory/Chest: CTA Bilaterally without wheezes or rales. Normal effort    GI/Abdomen: BS +, non-tender, non-distended Ext: no clubbing, cyanosis, 1+ R>L LE edema Psych: pleasant and cooperative  Musculoskeletal:     Cervical back: Normal range of motion. No rigidity.    Neurological:     Comments: Intact to light touch in all 4 extremities B/L  CN's intact on exam. Good insight and awareness. Ox3- appropriate   RUE- 5-/5 in deltoid, biceps, triceps, WE, grip and FA LUE- deltoid 2-/5; weak vs pain; Biceps 4-/5; Triceps 3-/5; WE 4-/5 grip 4-/5; FA 4-/5 RLE- 4+/5 in HF, KE, KF DF and PF LLE- Proximal 3+5; distal 4-/5  Skin:    General: Skin is warm and dry.     groin and abdominal wounds all essentially closed  -former line sites look ok,      Assessment/Plan: 1. Functional deficits which require 3+ hours per day of interdisciplinary therapy in a comprehensive inpatient rehab setting. Physiatrist is providing close team supervision and 24 hour management of active medical problems listed below. Physiatrist and rehab team continue to assess barriers to discharge/monitor patient progress toward functional and medical goals  Care Tool:  Bathing    Body parts bathed by patient: Right arm, Left arm, Chest, Face  Body parts bathed by helper: Abdomen, Front perineal area, Buttocks, Right upper leg, Left upper leg, Right lower leg, Left lower leg     Bathing assist Assist Level: Moderate Assistance - Patient 50 - 74%     Upper Body Dressing/Undressing Upper body dressing   What is the patient wearing?: Pull over shirt    Upper body assist Assist Level: Maximal Assistance - Patient 25 - 49%    Lower Body Dressing/Undressing Lower body dressing      What is the patient wearing?: Incontinence brief, Pants     Lower body assist Assist for lower body dressing: Total Assistance - Patient < 25%      Toileting Toileting    Toileting assist Assist for toileting: Total Assistance - Patient < 25% (required urinal)     Transfers Chair/bed transfer  Transfers assist  Chair/bed transfer activity did not occur: Refused  Chair/bed transfer assist level: Maximal Assistance - Patient 25 - 49% Chair/bed transfer assistive device: Walker, Armrests   Locomotion Ambulation   Ambulation assist   Ambulation activity did not occur: Safety/medical concerns          Walk 10 feet activity   Assist  Walk 10 feet activity did not occur: Safety/medical concerns        Walk 50 feet activity   Assist Walk 50 feet with 2 turns activity did not occur: Safety/medical concerns         Walk 150 feet activity   Assist Walk 150 feet activity did not occur: Safety/medical concerns         Walk 10 feet on uneven surface  activity   Assist Walk 10 feet on uneven surfaces activity did not occur: Safety/medical concerns         Wheelchair     Assist Is the patient using a wheelchair?: No             Wheelchair 50 feet with 2 turns activity    Assist            Wheelchair 150 feet activity     Assist          Blood pressure 109/61, pulse 92, temperature 97.9 F (36.6 C), temperature source Oral, resp. rate 18, height 5\' 10"  (1.778 m), weight 79.5 kg, SpO2 93 %.  Assessment/Plan: Functional deficits secondary  to Debility from MSSA bactermia/endovascular repair of aortic leak; PE and Previous styage IIIB lung CA with new hepatic and bone metastases-  ELOS- 12-14 days-supervision. Pt may shower -Continue CIR therapies including PT, OT  2. DVT/R lieofemoral DVT- Lovenox 70 mg BID- until CT in 4 weeks by Vascular with potential switch to oral 3. Pain Management: Tylenola nd Percocet as needed for pain- for LUE/LLE and back pain .   12/12 prn percocet,  Restarted Gabapentin 300 mg QHS- pt denied pain this morning.  4. Mood:- will con't pt  seeing Palliative care due to his cancer and ego support as required- might need to see neuropsychology for assistance.  5. Skin/Wound Care: Routine skin checks-  off Air bed, because transfers are harder on air mattress and mointor skin closely. Groin wounds appear healed over.  6. . Fluids/Electrolytes/Nutrition: Routine in and outs with follow-up  7. MSSA bacteremia- is on Cefazolin q8 hours for 4 weeks from 09/09/21 -thoracentesis culture are still pending.  8. Persistent Afib- is recent diagnosis- Metoprolol just increased to 75 mg BID and on Diltiazem 120 mg daily- monitor for hypotension and hold meds if BP <100.  12/12 HR controlled 9. S/p aortic leak/endovascular repair- periaortic hematoma- vascular to reorder at CT in 4 weeks to follow up 10. Previous Stage IIIB Lung CA- with new hepatic and bone mets- likely Stage IV-  -pallaitve care followingd- -pt is DNR - F/U with Oncology and maybe hospice after d/c.  11. Constipation- acute nursing to try and get pt cleaned out before admission to rehab- if not, order Sorbitol to get him to go.   12/11- cleaned out- con't regimen 12. PICC line for IV ABX- as of 09/17/21 13. LE Edema--needs KHT's, elevation of limbs               LOS: 2 days A FACE TO FACE EVALUATION WAS PERFORMED  Meredith Staggers 09/19/2021, 10:40 AM

## 2021-09-19 NOTE — Progress Notes (Signed)
PMR Admission Coordinator Pre-Admission Assessment   Patient: Leon Lyons is an 87 y.o., male MRN: [id number] DOB: 20-Jan-1933 Height: 5\' 10"  (177.8 cm) Weight: 75.3 kg   Insurance Information HMO:     PPO:      PCP:      IPA:      80/20:      OTHER:  PRIMARY: Medicare A and B      Policy#: [id number]      Subscriber: pt CM Name:       Phone#:      Fax#:  Pre-Cert#:       Employer:  Benefits:  Phone #:      Name:  Eff. Date: A 02/07/00, B 04/09/03     Deduct: $1556      Out of Pocket Max: n/a      Life Max: n/a CIR: 100%      SNF: 20 full days Outpatient: 80%     Co-Ins: 20% Home Health: 100%      Co-Pay:  DME: 80%     Co-Ins: 20% Providers:  SECONDARY: BCBS/Federal      Policy#: [id number]     Phone#:    Financial Counselor:       Phone#:    The "Data Collection Information Summary" for patients in Inpatient Rehabilitation Facilities with attached "Privacy Act [place] Records" was provided and verbally reviewed with: Patient and Family   Emergency Contact Information Contact Information       Name Relation Home Work Mobile    Vu,April Spouse [phone]   [phone]           Current Medical History  Patient Admitting Diagnosis: debility    History of Present Illness: Pt is an 87 y/o male with only recently complex medical history of metastatic lung cancer (stage IIIb), afib, pulmonary embolism after abdominal surgery for vovulus, who presented to [facility] on 12/2 with DOE.  ED workup chest xray for L upper lobe infiltrate and L pleural effusion.  CT chest showed left perihilar postradiation changes with fibrosis and ground glass opacities, as well as R 5th rib metastatic lesion, right lower lobe PE with pulmonary infarction.  CT abdomen showed R iliofemoral DVT and contained aortic leak at hte aortic bifurcation with periaortic hematoma.  Hbg 7.4.  Pt transferred to [facility] for vascular intervention.  Pt received total of 3 units PRBCs throughout  hospitalization and hemoglobin remains stable.  GI recommended conservative management.  Aortic leak now s/p repair and vascular following.  Pt tolerating anticoagulation with lovenox, repeat CT scan in 4 weeks.  New afib with challenging rate control due to ongoing medical issues.  Currently on metoprolol 25 mg BID and cardizem 120 mg.  Therapy evaluations were completed and pt was recommended for CIR.    Patient's medical record from [facility] has been reviewed by the rehabilitation admission coordinator and physician.   Past Medical History      Past Medical History:  Diagnosis Date   History of radiation therapy 03/24/2021    left lung   02/11/2021-03/24/2021  Dr Gery Pray   Lung cancer [facility])     Tobacco use        Has the patient had major surgery during 100 days prior to admission? Yes   Family History   family history includes Hypertension in an other family member.   Current Medications   Current Facility-Administered Medications:    acetaminophen (TYLENOL) tablet 650 mg, 650 mg, Oral, Q6H PRN, 650  mg at 09/16/21 2142 **OR** acetaminophen (TYLENOL) suppository 650 mg, 650 mg, Rectal, Q6H PRN, Croitoru, Mihai, MD   alum & mag hydroxide-simeth (MAALOX/MYLANTA) 200-200-20 MG/5ML suspension 15-30 mL, 15-30 mL, Oral, Q2H PRN, Croitoru, Mihai, MD   bisacodyl (DULCOLAX) suppository 10 mg, 10 mg, Rectal, Daily PRN, Croitoru, Mihai, MD   bisacodyl (DULCOLAX) suppository 10 mg, 10 mg, Rectal, Once, Barb Merino, MD   ceFAZolin (ANCEF) IVPB 2g/100 mL premix, 2 g, Intravenous, Q8H, Croitoru, Mihai, MD, Last Rate: 200 mL/hr at 09/17/21 0650, 2 g at 09/17/21 0650   cyclobenzaprine (FLEXERIL) tablet 5 mg, 5 mg, Oral, TID PRN, Croitoru, Mihai, MD, 5 mg at 09/14/21 0553   diltiazem (CARDIZEM) tablet 120 mg, 120 mg, Oral, Daily, Croitoru, Mihai, MD, 120 mg at 09/16/21 1046   docusate sodium (COLACE) capsule 100 mg, 100 mg, Oral, Daily, Croitoru, Mihai, MD, 100 mg at 09/16/21 1045   enoxaparin  (LOVENOX) injection 70 mg, 70 mg, Subcutaneous, Q12H, Croitoru, Mihai, MD, 70 mg at 09/16/21 2142   feeding supplement (ENSURE ENLIVE / ENSURE PLUS) liquid 237 mL, 237 mL, Oral, BID BM, Croitoru, Mihai, MD, 237 mL at 79/39/03 0092   folic acid (FOLVITE) tablet 1 mg, 1 mg, Oral, Daily, Croitoru, Mihai, MD, 1 mg at 09/16/21 1046   guaiFENesin-dextromethorphan (ROBITUSSIN DM) 100-10 MG/5ML syrup 5 mL, 5 mL, Oral, Q6H PRN, Croitoru, Mihai, MD   lidocaine (XYLOCAINE) 1 % (with pres) injection, , , PRN, Covington, Jamie R, NP, 20 mL at 09/15/21 0930   MEDLINE mouth rinse, 15 mL, Mouth Rinse, BID, Ghimire, Kuber, MD, 15 mL at 09/16/21 2147   metoprolol tartrate (LOPRESSOR) injection 5 mg, 5 mg, Intravenous, Q4H PRN, Croitoru, Mihai, MD, 5 mg at 09/13/21 0920   metoprolol tartrate (LOPRESSOR) tablet 50 mg, 50 mg, Oral, BID, Ghimire, Kuber, MD   morphine 2 MG/ML injection 2 mg, 2 mg, Intravenous, Q2H PRN, Croitoru, Mihai, MD, 2 mg at 09/15/21 1313   multivitamin with minerals tablet 1 tablet, 1 tablet, Oral, Daily, Croitoru, Mihai, MD, 1 tablet at 09/16/21 1046   ondansetron (ZOFRAN) tablet 4 mg, 4 mg, Oral, Q6H PRN **OR** ondansetron (ZOFRAN) injection 4 mg, 4 mg, Intravenous, Q6H PRN, Croitoru, Mihai, MD, 4 mg at 09/13/21 3300   oxyCODONE-acetaminophen (PERCOCET/ROXICET) 5-325 MG per tablet 1-2 tablet, 1-2 tablet, Oral, Q4H PRN, Croitoru, Mihai, MD, 1 tablet at 09/17/21 0436   pantoprazole (PROTONIX) EC tablet 40 mg, 40 mg, Oral, Daily, Croitoru, Mihai, MD, 40 mg at 09/16/21 1046   phenol (CHLORASEPTIC) mouth spray 1 spray, 1 spray, Mouth/Throat, PRN, Croitoru, Mihai, MD   polyethylene glycol (MIRALAX / GLYCOLAX) packet 17 g, 17 g, Oral, Daily PRN, Croitoru, Mihai, MD, 17 g at 09/16/21 1046   rosuvastatin (CRESTOR) tablet 5 mg, 5 mg, Oral, Daily, Croitoru, Mihai, MD, 5 mg at 09/16/21 1046   sodium chloride (OCEAN) 0.65 % nasal spray 1 spray, 1 spray, Each Nare, Once, Croitoru, Mihai, MD   sodium chloride  (OCEAN) 0.65 % nasal spray 1 spray, 1 spray, Each Nare, PRN, Croitoru, Mihai, MD   Patients Current Diet:  Diet Order                  Diet - low sodium heart healthy             Diet regular Room service appropriate? Yes; Fluid consistency: Thin  Diet effective now  Precautions / Restrictions Precautions Precautions: Fall Precaution Comments: watch HR and SpO2 Restrictions Weight Bearing Restrictions: No    Has the patient had 2 or more falls or a fall with injury in the past year? No   Prior Activity Level Community (5-7x/wk): up until October had been completely independent, now using RW for mobility and ADLs   Prior Functional Level Self Care: Did the patient need help bathing, dressing, using the toilet or eating? Independent   Indoor Mobility: Did the patient need assistance with walking from room to room (with or without device)? Independent   Stairs: Did the patient need assistance with internal or external stairs (with or without device)? Independent   Functional Cognition: Did the patient need help planning regular tasks such as shopping or remembering to take medications? Independent   Patient Information Are you of Hispanic, Latino/a,or Spanish origin?: A. No, not of Hispanic, Latino/a, or Spanish origin What is your race?: A. White Do you need or want an interpreter to communicate with a doctor or health care staff?: 0. No   Patient's Response To:  Health Literacy and Transportation Is the patient able to respond to health literacy and transportation needs?: Yes Health Literacy - How often do you need to have someone help you when you read instructions, pamphlets, or other written material from your doctor or pharmacy?: Never In the past 12 months, has lack of transportation kept you from medical appointments or from getting medications?: Yes In the past 12 months, has lack of transportation kept you from meetings, work, or from  getting things needed for daily living?: Yes   Home Assistive Devices / [place] Devices/Equipment: None Home Equipment: Conservation officer, nature (2 wheels), Rollator (4 wheels)   Prior Device Use: Indicate devices/aids used by the patient prior to current illness, exacerbation or injury? Walker   Current Functional Level Cognition   Overall Cognitive Status: Within Functional Limits for tasks assessed Orientation Level: Oriented X4 General Comments: Pt agreeable to therapy today without encouragement. Stated he knows he needs to do it.    Extremity Assessment (includes Sensation/Coordination)   Upper Extremity Assessment: LUE deficits/detail LUE Deficits / Details: mild shoulder pain with flexion, able to reach functionally across body to bedrails LUE: Shoulder pain with ROM  Lower Extremity Assessment: Defer to PT evaluation RLE Deficits / Details: AROM WFL, strength grossly 2+/5 hip flexion, 4-/5 knee extension LLE Deficits / Details: AAROM WFL, strength hip flexion 2-/5, knee extension 3-/5     ADLs   Overall ADL's : Needs assistance/impaired Eating/Feeding: Set up, Sitting Grooming: Set up, Sitting Upper Body Bathing: Minimal assistance, Sitting Upper Body Bathing Details (indicate cue type and reason): to bathe back sitting EOB Lower Body Bathing: Maximal assistance, Sitting/lateral leans Upper Body Dressing : Minimal assistance, Sitting Lower Body Dressing: Maximal assistance, Sitting/lateral leans Lower Body Dressing Details (indicate cue type and reason): attempted cross legs over each knee, pt. lifted up and said "thats it cant do it".  encouraged to try to cross leg over and he said "thats it".  pointed to wife and states "she does that for me".  but also reported he was able to perform LB dressing Toilet Transfer: +2 for safety/equipment, Minimal assistance (used stedy) Toilet Transfer Details (indicate cue type and reason): simulated with use of stedy from chair  to bed 2 person assist for guiding pt. and equipement safely Toileting- Clothing Manipulation and Hygiene: Maximal assistance, Sitting/lateral lean, Bed level General ADL Comments: limited progression as pt. for  adls secondary to pt. stating he was unable to attempt lb adls.  utilized stedy equipment for transfer back to bed pt. reporting he is unable to stand without assistance noting LUE limited function along with LLE     Mobility   Overal bed mobility: Needs Assistance Bed Mobility: Sit to Supine Rolling: Supervision Sidelying to sit: Mod assist, HOB elevated, Max assist Supine to sit: Mod assist, HOB elevated Sit to supine: Max assist, +2 for physical assistance Sit to sidelying: Mod assist General bed mobility comments: 2 person assistance to manage stedy equipment and guide pts. trunk and b les into bed     Transfers   Overall transfer level: Needs assistance Equipment used: Ambulation equipment used Transfers: Bed to chair/wheelchair/BSC, Sit to/from Stand Sit to Stand: +2 physical assistance, Mod assist Bed to/from chair/wheelchair/BSC transfer type:: Via Lift equipment  Lateral/Scoot Transfers: Max Barrister's clerk via Lift Equipment: Stedy General transfer comment: Heavy mod A +2 to stand from stedy onto eob; cues for upright posture and knee extension; able to stand 10-15 seconds. Mod A +2 for eccentric control lowering to eob from chair.     Ambulation / Gait / Stairs / Proofreader / Balance Dynamic Sitting Balance Sitting balance - Comments: sat EOB about 10-15 minutes focus on balance, activity tolerance pursed lip breathing and discussing plans for follow up PT Balance Overall balance assessment: Needs assistance Sitting-balance support: Feet supported, Single extremity supported Sitting balance-Leahy Scale: Fair Sitting balance - Comments: sat EOB about 10-15 minutes focus on balance, activity tolerance pursed lip breathing and discussing plans  for follow up PT Standing balance-Leahy Scale: Zero Standing balance comment: Pt requrring +2 assist to stand in [place] with BUE support     Special needs/care consideration Continuous Drip IV  ancef 2g at 200 mL/hr    Previous Home Environment (from acute therapy documentation) Living Arrangements: Spouse/significant other Available Help at Discharge: Family Type of Home: House Home Layout: One level Home Access: Stairs to enter Technical brewer of Steps: 1 Home Care Services: No Additional Comments: reports his wife ordered a shower chair online recently   Discharge Living Setting Plans for Discharge Living Setting: Patient's home, Lives with (comment) (spouse) Type of Home at Discharge: House Discharge Home Layout: One level Discharge Home Access: Stairs to enter Entrance Stairs-Rails: None Entrance Stairs-Number of Steps: 1 Discharge Bathroom Shower/Tub: Tub/shower unit Discharge Bathroom Toilet: Standard Discharge Bathroom Accessibility: Yes How Accessible: Accessible via walker Does the patient have any problems obtaining your medications?: No   Social/Family/Support Systems Patient Roles: Spouse Anticipated Caregiver: spouse, April Anticipated Caregiver's Contact Information: [phone] Ability/Limitations of Caregiver: min assist Caregiver Availability: 24/7 Discharge Plan Discussed with Primary Caregiver: Yes Is Caregiver In Agreement with Plan?: Yes Does Caregiver/Family have Issues with Lodging/Transportation while Pt is in Rehab?: No   Goals Patient/Family Goal for Rehab: PT/OT supervision, SLP n/a Expected length of stay: 12-14 days Pt/Family Agrees to Admission and willing to participate: Yes Program Orientation Provided & Reviewed with Pt/Caregiver Including Roles  & Responsibilities: Yes   Decrease burden of Care through IP rehab admission: na   Possible need for SNF placement upon discharge: no   Patient Condition: I have reviewed medical  records from [facility], spoken with CM, and patient and spouse. I met with patient at the bedside for inpatient rehabilitation assessment.  Patient will benefit from ongoing PT and OT, can actively participate in 3 hours of therapy a day 5  days of the week, and can make measurable gains during the admission.  Patient will also benefit from the coordinated team approach during an Inpatient Acute Rehabilitation admission.  The patient will receive intensive therapy as well as Rehabilitation physician, nursing, social worker, and care management interventions.  Due to bladder management, bowel management, safety, skin/wound care, disease management, medication administration, pain management, and patient education the patient requires 24 hour a day rehabilitation nursing.  The patient is currently mod +2 with mobility and basic ADLs.  Discharge setting and therapy post discharge at home with home health is anticipated.  Patient has agreed to participate in the Acute Inpatient Rehabilitation Program and will admit today.   Preadmission Screen Completed By: Shann Medal, PT, DPT with updates by Genella Mech, 09/17/2021 9:08 AM ______________________________________________________________________   Discussed status with Dr. Dagoberto Ligas  on 09/17/21  at 845 and received approval for admission today.   Admission Coordinator:  Genella Mech, CCC-SLP, time 900/Date 09/17/21 with day of updates by Clemens Catholic, MS, CCC-SLP     Assessment/Plan: Diagnosis: Does the need for close, 24 hr/day Medical supervision in concert with the patient's rehab needs make it unreasonable for this patient to be served in a less intensive setting? Yes Co-Morbidities requiring supervision/potential complications: Stage IV lung CA- with mets ot liver; bone/rib; Afib; PE; MSSA bacteremia- PICC line; periaortic hematoma s/p contained aortic leak/s/p repair; Weakness;debility Due to bladder management, bowel management, safety,  skin/wound care, disease management, medication administration, pain management, and patient education, does the patient require 24 hr/day rehab nursing? Yes Does the patient require coordinated care of a physician, rehab nurse, PT, OT, and SLP to address physical and functional deficits in the context of the above medical diagnosis(es)? Yes Addressing deficits in the following areas: balance, endurance, locomotion, strength, transferring, bowel/bladder control, bathing, dressing, feeding, grooming, and toileting Can the patient actively participate in an intensive therapy program of at least 3 hrs of therapy 5 days a week? Yes The potential for patient to make measurable gains while on inpatient rehab is fair Anticipated functional outcomes upon discharge from inpatient rehab: supervision PT, supervision OT, supervision SLP Estimated rehab length of stay to reach the above functional goals is: 14-16 days Anticipated discharge destination: Home 10. Overall Rehab/Functional Prognosis: fair     MD Signature:

## 2021-09-20 ENCOUNTER — Inpatient Hospital Stay (HOSPITAL_COMMUNITY): Payer: Medicare Other

## 2021-09-20 DIAGNOSIS — R5381 Other malaise: Secondary | ICD-10-CM | POA: Diagnosis not present

## 2021-09-20 DIAGNOSIS — R600 Localized edema: Secondary | ICD-10-CM | POA: Diagnosis not present

## 2021-09-20 DIAGNOSIS — C61 Malignant neoplasm of prostate: Secondary | ICD-10-CM | POA: Diagnosis not present

## 2021-09-20 LAB — CULTURE, BODY FLUID W GRAM STAIN -BOTTLE: Culture: NO GROWTH

## 2021-09-20 MED ORDER — DICLOFENAC SODIUM 1 % EX GEL
2.0000 g | Freq: Three times a day (TID) | CUTANEOUS | Status: DC
  Administered 2021-09-20 – 2021-09-24 (×11): 2 g via TOPICAL
  Filled 2021-09-20: qty 100

## 2021-09-20 NOTE — Progress Notes (Signed)
PROGRESS NOTE   Subjective/Complaints: Pt slept well. No pain. A little tired from therapies.   ROS: Patient denies fever, rash, sore throat, blurred vision, nausea, vomiting, diarrhea, cough, shortness of breath or chest pain, joint or back pain, headache, or mood change.    Objective:   No results found. No results for input(s): WBC, HGB, HCT, PLT in the last 72 hours.  No results for input(s): NA, K, CL, CO2, GLUCOSE, BUN, CREATININE, CALCIUM in the last 72 hours.  Intake/Output Summary (Last 24 hours) at 09/20/2021 0921 Last data filed at 09/20/2021 0106 Gross per 24 hour  Intake 120 ml  Output 1000 ml  Net -880 ml        Physical Exam: Vital Signs Blood pressure 115/69, pulse 94, temperature 97.6 F (36.4 C), temperature source Oral, resp. rate 18, height 5\' 10"  (1.778 m), weight 79.5 kg, SpO2 94 %.   Constitutional: No distress . Vital signs reviewed. HEENT: NCAT, EOMI, oral membranes moist Neck: supple Cardiovascular: RRR without murmur. No JVD    Respiratory/Chest: CTA Bilaterally without wheezes or rales. Normal effort    GI/Abdomen: BS +, non-tender, non-distended Ext: no clubbing, cyanosis, 1+ L>R LE edema Psych: pleasant and cooperative  Musculoskeletal:     Cervical back: Normal range of motion. No rigidity.    Neurological:     Comments: Intact to light touch in all 4 extremities B/L  CN's intact on exam. Good insight and awareness. Ox3- appropriate   RUE- 5-/5 in deltoid, biceps, triceps, WE, grip and FA LUE- deltoid 2-/5; weak vs pain; Biceps 4-/5; Triceps 3-/5; WE 4-/5 grip 4-/5; FA 4-/5 RLE- 4+/5 in HF, KE, KF DF and PF LLE- Proximal 3to 3+5; distal 4-/5  Skin:    General: Skin is warm and dry.     groin and abdominal wounds all dry, closed          Assessment/Plan: 1. Functional deficits which require 3+ hours per day of interdisciplinary therapy in a comprehensive inpatient rehab  setting. Physiatrist is providing close team supervision and 24 hour management of active medical problems listed below. Physiatrist and rehab team continue to assess barriers to discharge/monitor patient progress toward functional and medical goals  Care Tool:  Bathing    Body parts bathed by patient: Right arm, Left arm, Chest, Face   Body parts bathed by helper: Abdomen, Front perineal area, Buttocks, Right upper leg, Left upper leg, Right lower leg, Left lower leg     Bathing assist Assist Level: Moderate Assistance - Patient 50 - 74%     Upper Body Dressing/Undressing Upper body dressing   What is the patient wearing?: Pull over shirt    Upper body assist Assist Level: Maximal Assistance - Patient 25 - 49%    Lower Body Dressing/Undressing Lower body dressing      What is the patient wearing?: Incontinence brief, Pants     Lower body assist Assist for lower body dressing: Total Assistance - Patient < 25%     Toileting Toileting    Toileting assist Assist for toileting: Total Assistance - Patient < 25% (required urinal)     Transfers Chair/bed transfer  Transfers assist  Chair/bed  transfer activity did not occur: Refused  Chair/bed transfer assist level: Maximal Assistance - Patient 25 - 49% Chair/bed transfer assistive device: Programmer, multimedia   Ambulation assist   Ambulation activity did not occur: Safety/medical concerns          Walk 10 feet activity   Assist  Walk 10 feet activity did not occur: Safety/medical concerns        Walk 50 feet activity   Assist Walk 50 feet with 2 turns activity did not occur: Safety/medical concerns         Walk 150 feet activity   Assist Walk 150 feet activity did not occur: Safety/medical concerns         Walk 10 feet on uneven surface  activity   Assist Walk 10 feet on uneven surfaces activity did not occur: Safety/medical concerns         Wheelchair     Assist  Is the patient using a wheelchair?: No             Wheelchair 50 feet with 2 turns activity    Assist            Wheelchair 150 feet activity     Assist          Blood pressure 115/69, pulse 94, temperature 97.6 F (36.4 C), temperature source Oral, resp. rate 18, height 5\' 10"  (1.778 m), weight 79.5 kg, SpO2 94 %.  Assessment/Plan: Functional deficits secondary  to Debility from MSSA bactermia/PE/endovascular repair of aortic leak; PE and Previous styage IIIB lung CA with new hepatic and bone metastases.  ELOS-  21 days-supervision. Pt may shower -Continue CIR therapies including PT and OT. Interdisciplinary team conference today to discuss goals, barriers to discharge, and dc planning.   2. DVT/R lieofemoral DVT- Lovenox 70 mg BID- until CT in 4 weeks by Vascular with potential switch to oral 3. Pain Management: Tylenola nd Percocet as needed for pain- for LUE/LLE and back pain .   12/12 prn percocet,  Restarted Gabapentin 300 mg QHS- pt denied pain this morning.  4. Mood:-  Palliative care following due to his cancer and ego support as required-  ?neuropsychology    5. Skin/Wound Care: encouraging nutrition  -wounds healing 6. . Fluids/Electrolytes/Nutrition: encourage PO -pt is drinking protein supps  7. MSSA bacteremia- is on Cefazolin q8 hours for 4 weeks from 09/09/21 -thoracentesis culture are negative/pending thus far.  8. Persistent Afib- is recent diagnosis- Metoprolol just increased to 75 mg BID and on Diltiazem 120 mg daily- monitor for hypotension and hold meds if BP <100.   12/13 HR controlled 9. S/p aortic leak/endovascular repair- periaortic hematoma- vascular to reorder at CT in 4 weeks to follow up 10. Previous Stage IIIB Lung CA- with new hepatic and bone mets- likely Stage IV-  -palliative care follow up appreciated -pt is DNR - F/U with Oncology and maybe hospice after d/c.  11. Constipation-    12/11- cleaned out- cont regimen 12. PICC  line for IV ABX- as of 09/17/21 13. LE Edema--needs KHT's, elevation of limbs, improve nutrition               LOS: 3 days A FACE TO FACE EVALUATION WAS PERFORMED  Meredith Staggers 09/20/2021, 9:21 AM

## 2021-09-20 NOTE — Progress Notes (Addendum)
Occupational Therapy Session Note  Patient Details  Name: Leon Lyons MRN: [id number] Date of Birth: 02-Oct-1933  Today's Date: 09/20/2021 OT Individual Time: 1300-1358 OT Individual Time Calculation (min): 58 min    Short Term Goals: Week 1:  OT Short Term Goal 1 (Week 1): Pt will perform sit to stand with mod A 50% of time without the STEDY for clothing mangement OT Short Term Goal 2 (Week 1): Pt will be able to transfer with mod A 50% of the time to the toilet/ BSC OT Short Term Goal 3 (Week 1): PT will be able to thread LB clothing with min A with AE prn  Skilled Therapeutic Interventions/Progress Updates:  Pt greeted  supine in bed agreeable to OT intervention. Session focus on functional mobility, increasing overall activity tolerance. Pt completed supine>sit via log roll technique with pt exiting to L side of bed with MODA. Most assist needed to elevate trunk and assist needed to maneuver BLEs to EOB. BP checked from EOB 101/63 ( 77) pt on RA with SpO2 >88% ( SpO2 increasing with rest break and PLB). Pt agreeable to attempt sit<>stand with rw but needed x3 attempts to power into standing with MAX A. Pt able to march in place and able to stand for a total of 30 secs before fatiguing. Pt completed additional sit<>stand with MAX A and able to stand an additional 30 secs before needing to sit. Pt needed MAX A to scoot hips to HOB from EOB. Education provided during session on energy conservation in relation to completing "self checks" and determining how much energy pt has left to make it through his entire day, pt and wife verbalized understanding of all education.Pt needed MOD A for sit>supine from EOB using log roll technique. Pt did report that he works on self ROM with LUE as pain mgmt strategy  from supine however injury is unclear  and LUE pain continues to be limiting therefore reached out to PA to determine if imaging is needed? pt left supine in bed with bed alarm activated and all  needs within reach.                       Therapy Documentation Precautions:  Precautions Precautions: Fall, Other (comment) Precaution Comments: monitor BP, HR, and SpO2; possible L shoulder RC injury ? Restrictions Weight Bearing Restrictions: No  Pain: 7/10 pain reported in low back, LUE, and L side, provided rest breaks and repositioning as pain mgmt. Pain meds provided prior to session     Therapy/Group: Individual Therapy  [name] 09/20/2021, 3:50 PM

## 2021-09-20 NOTE — Progress Notes (Signed)
Physical Therapy Session Note  Patient Details  Name: Leon Lyons MRN: [id number] Date of Birth: Oct 15, 1932  Today's Date: 09/20/2021 PT Individual Time: 4917-9150; 5697-9480 PT Individual Time Calculation (min): 39 min  + 27 mins and Today's Date: 09/20/2021 PT Missed Time: 21 Minutes Missed Time Reason: Patient fatigue;Pain  Short Term Goals: Week 1:  PT Short Term Goal 1 (Week 1): Pt will perform supine<>sit with min assist PT Short Term Goal 2 (Week 1): Pt will perform sit<>stands using LRAD with min assist PT Short Term Goal 3 (Week 1): Pt will perform bed<>chair transfers using LRAD with min assist PT Short Term Goal 4 (Week 1): Pt will ambulate at least 43ft using LRAD with mod assist and +2 w/c follow if needed  Skilled Therapeutic Interventions/Progress Updates:    Session 1: Patient received supine in bed, agreeable to PT. Wife at bedside. She reports 7-8/10 pain "al-l over the left side." Premedicated. PT providing rest breaks, distractions and repositioning to assist with pain management. Patient perseverative on not being able to lift L arm against gravity. Emotional regarding recent complex medical course. PT providing appropriate emotional support and recommends that patient (and wife) speak with neuropsych or another professional who may be able to assist with coping strategies. Patient rolling to R with ModA and coming to sit edge of bed with MaxA. TotalA needed to scoot forward on bed. Seated marching completed with very limited AROM in B LE noted. Patient tolerated sitting edge of bed for ~3 mins before requesting to lay down due to increase in pain. TotalA x2 to return supine and reposition in bed. Patient (and wife) requesting that patient remain in bed to rest. Bed alarm on, call light within reach.   Session 2: Patient received supine in bed, agreeable to PT. Handoff from [place] and wife present. Patient reports 7-8/10 pain in low back primarily, premedicated. PT  providing rest breaks, distractions and repositioning to assist with pain management. He was able to come sit edge of bed with MaxA and verbal cues for sequencing log rolling. MaxA to come to sit edge of bed with TotalA to scoot forward. Sit <> stand x3 with RW and MaxA + max multimodal cues for anterior weight shift. Despite cues, patient still with strong posterior lean when trying to power up into standing. Attempted sit <> stand x2 without AD and same results as above. Patient requesting to lay back down due to fatigue. TotalA to do so. Patient questioning why he was so fatigued and voiced frustration regarding current physical abilities. PT providing appropriate emotional support. Patient and wife would benefit from further education regarding disease process, prognosis, and decision making. Patient remaining in bed, bed alarm on, call light within reach, wife at bedside.   Therapy Documentation Precautions:  Precautions Precautions: Fall, Other (comment) Precaution Comments: monitor BP, HR, and SpO2; possible L shoulder RC injury ? Restrictions Weight Bearing Restrictions: No    Therapy/Group: Individual Therapy  Karoline Caldwell, PT, DPT, CBIS  09/20/2021, 7:41 AM

## 2021-09-20 NOTE — Progress Notes (Signed)
Patient ID: Leon Lyons, male   DOB: [birth year], 87 y.o.   MRN: [id number]  SW met with pt and pt wife in room to provide updates from team conference, and d/c date 10/11/21. Pt understands if he makes gains in rehab, his d/c date will be adjusted. SW will continue to follow-up with updates.  Loralee Pacas, MSW, [place] Office: [phone] Cell: [phone] Fax: [phone]

## 2021-09-20 NOTE — IPOC Note (Signed)
Overall Plan of Care [facility]) Patient Details Name: Leon Lyons MRN: [id number] DOB: May 02, 1933  Admitting Diagnosis: [facility] Problems: Principal Problem:   Debility     Functional Problem List: Nursing Endurance, Medication Management, Perception, Safety, Pain, Skin Integrity  PT Balance, Perception, Behavior, Safety, Edema, Sensory, Endurance, Skin Integrity, Motor, Nutrition, Pain  OT Balance, Cognition, Edema, Endurance, Motor, Pain, Perception, Safety, Sensory, Skin Integrity  SLP    TR         Basic ADL's: OT Grooming, Bathing, Dressing, Toileting     Advanced  ADL's: OT       Transfers: PT Bed Mobility, Bed to Chair, Car, Manufacturing systems engineer, Metallurgist: PT Ambulation, Emergency planning/management officer, Stairs     Additional Impairments: OT Fuctional Use of Upper Extremity  SLP        TR      Anticipated Outcomes Item Anticipated Outcome  Self Feeding n/a  Swallowing      Basic self-care  min A  Toileting  min A   Bathroom Transfers supervision  Bowel/Bladder  n/a  Transfers  CGA using LRAD  Locomotion  CGA using LRAD  Communication     Cognition     Pain  <3  Safety/Judgment  supervision and no falls   Therapy Plan: PT Intensity: Minimum of 1-2 x/day ,45 to 90 minutes PT Frequency: 5 out of 7 days PT Duration Estimated Length of Stay: ~3-3.5 weeks OT Intensity: Minimum of 1-2 x/day, 45 to 90 minutes OT Frequency: 5 out of 7 days OT Duration/Estimated Length of Stay: ~ 3 weeks     Due to the current state of emergency, patients may not be receiving their 3-hours of Medicare-mandated therapy.   Team Interventions: Nursing Interventions Patient/Family Education, Disease Management/Prevention, Pain Management, Medication Management, Skin Care/Wound Management, Discharge Planning  PT interventions Ambulation/gait training, Community reintegration, DME/adaptive equipment instruction, Neuromuscular re-education,  Psychosocial support, Stair training, UE/LE Strength taining/ROM, Wheelchair propulsion/positioning, Training and development officer, Discharge planning, Pain management, Functional electrical stimulation, Skin care/wound management, Therapeutic Activities, UE/LE Coordination activities, Cognitive remediation/compensation, Disease management/prevention, Functional mobility training, Patient/family education, Splinting/orthotics, Therapeutic Exercise, Visual/perceptual remediation/compensation  OT Interventions Training and development officer, DME/adaptive equipment instruction, Patient/family education, Therapeutic Activities, Cognitive remediation/compensation, Functional electrical stimulation, Psychosocial support, Therapeutic Exercise, Community reintegration, Functional mobility training, Self Care/advanced ADL retraining, UE/LE Strength taining/ROM, Discharge planning, Neuromuscular re-education, Skin care/wound managment, UE/LE Coordination activities, Disease mangement/prevention, Pain management, Visual/perceptual remediation/compensation  SLP Interventions    TR Interventions    SW/CM Interventions Discharge Planning, Psychosocial Support, Patient/Family Education   Barriers to Discharge MD  Medical stability  Nursing Decreased caregiver support, Home environment access/layout, Wound Care, Lack of/limited family support, Medication compliance, Pending chemo/radiation Lives in 1 level home with 1 step to enter, no rails. Lives with spouse who can provide min assist.  PT Inaccessible home environment, Nutrition means, Pending chemo/radiation    OT      SLP      SW Decreased caregiver support, Lack of/limited family support     Team Discharge Planning: Destination: PT-Home ,OT- Home , SLP-  Projected Follow-up: PT-Home health PT, 24 hour supervision/assistance, OT-  Home health OT, SLP-  Projected Equipment Needs: PT-To be determined, OT- To be determined, SLP-  Equipment Details: PT- , OT-   Patient/family involved in discharge planning: PT- Patient, Family member/caregiver,  OT-Patient, Family member/caregiver, SLP-   MD ELOS: 21 days Medical Rehab Prognosis:  Excellent Assessment: The patient has been admitted for CIR therapies with  the diagnosis of debility after surgery/multiple medical. The team will be addressing functional mobility, strength, stamina, balance, safety, adaptive techniques and equipment, self-care, bowel and bladder mgt, patient and caregiver education, pain mgt, community reentry. Goals have been set at supervision to min assist with self-care and contact guard assist with mobility.   Due to the current state of emergency, patients may not be receiving their 3 hours per day of Medicare-mandated therapy.    Meredith Staggers, MD, FAAPMR     See Team Conference Notes for weekly updates to the plan of care

## 2021-09-20 NOTE — Progress Notes (Signed)
Occupational Therapy Session Note  Patient Details  Name: Leon Lyons MRN: [id number] Date of Birth: [birth year]  Today's Date: 09/20/2021 OT Individual Time: 2671-2458 OT Individual Time Calculation (min): 40 min    Short Term Goals: Week 1:  OT Short Term Goal 1 (Week 1): Pt will perform sit to stand with mod A 50% of time without the STEDY for clothing mangement OT Short Term Goal 2 (Week 1): Pt will be able to transfer with mod A 50% of the time to the toilet/ BSC OT Short Term Goal 3 (Week 1): PT will be able to thread LB clothing with min A with AE prn  Skilled Therapeutic Interventions/Progress Updates:    Pt greeted semi-reclined in bed with wife April present. Pt reported fatigue but eager to participate. Worked on bed mobility with pt able to help advance LE's towards EOB, then needed max A to elevate trunk into sitting. Mod A for weight shifting to scoot hips towards EOB. Pt very fatigued after bed mobility requiring extended rest break. Once rested, worked on anterior weight shift and reaching to try to thread pant legs. Max A to complete task. Pt reported max fatigue and 9/10 pain in L shoulder and back. Pt reported need to lay back down with total A. +2 assist to scoot back up in bed. Nursing notified of pt pain and entered to provide pain meds. OT issued medium firm squeeze sponge and educated on use for hand strengthening exercises. OT also assisted pt into sidelying and placed heat packs to shoulder blade. OT offered pt to try to get back up again after pain modifications, but pt reported he was still in too much pain. Pt missed 20 minutes of OT treatment session due to pain and fatigue.   Therapy Documentation Precautions:  Precautions Precautions: Fall, Other (comment) Precaution Comments: monitor BP, HR, and SpO2; possible L shoulder RC injury ? Restrictions Weight Bearing Restrictions: No  Pain: Pain Assessment Pain Scale: 0-10 Pain Score: 9  Pain Type: Acute  pain Pain Location: Shoulder Pain Orientation: Left Pain Descriptors / Indicators: Aching Pain Onset: With Activity Pain Intervention(s): Heat applied;RN made aware;Repositioned 2nd Pain Site Pain Score: 0    Therapy/Group: Individual Therapy  Valma Cava 09/20/2021, 10:30 AM

## 2021-09-20 NOTE — Progress Notes (Addendum)
Patient complaining of moderate left shoulder pain with movement of joint limiting OT progress. Will order plain films of left shoulder>>  FINDINGS: AC joint is intact. Probable loculated left pleural effusion. No fracture or malalignment. Lytic lesion involving the inferior scapula.   IMPRESSION: 1. Lytic lesion involving left scapula consistent with metastatic disease, present on CT from November 2. No acute osseous abnormality left shoulder 3. Moderate probably loculated left pleural effusion     Electronically Signed   By: Donavan Foil M.D.   On: 09/20/2021 17:56

## 2021-09-21 DIAGNOSIS — M25512 Pain in left shoulder: Secondary | ICD-10-CM

## 2021-09-21 DIAGNOSIS — G8929 Other chronic pain: Secondary | ICD-10-CM

## 2021-09-21 LAB — CBC
HCT: 31.4 % — ABNORMAL LOW (ref 39.0–52.0)
Hemoglobin: 9.7 g/dL — ABNORMAL LOW (ref 13.0–17.0)
MCH: 30.4 pg (ref 26.0–34.0)
MCHC: 30.9 g/dL (ref 30.0–36.0)
MCV: 98.4 fL (ref 80.0–100.0)
Platelets: 255 10*3/uL (ref 150–400)
RBC: 3.19 MIL/uL — ABNORMAL LOW (ref 4.22–5.81)
RDW: 16.2 % — ABNORMAL HIGH (ref 11.5–15.5)
WBC: 6.6 10*3/uL (ref 4.0–10.5)
nRBC: 0 % (ref 0.0–0.2)

## 2021-09-21 MED ORDER — SODIUM CHLORIDE 0.9 % IV SOLN: INTRAVENOUS | Status: DC | PRN

## 2021-09-21 MED ORDER — ENOXAPARIN SODIUM 80 MG/0.8ML IJ SOSY
80.0000 mg | PREFILLED_SYRINGE | Freq: Two times a day (BID) | INTRAMUSCULAR | Status: DC
  Administered 2021-09-21 – 2021-09-26 (×11): 80 mg via SUBCUTANEOUS
  Filled 2021-09-21 (×11): qty 0.8

## 2021-09-21 NOTE — Progress Notes (Signed)
Physical Therapy Session Note  Patient Details  Name: Leon Lyons MRN: [id number] Date of Birth: 19-Dec-1932  Today's Date: 09/21/2021 PT Individual Time: 0805-0900 and 1307-1407 PT Individual Time Calculation (min): 55 min and 60 min  Short Term Goals: Week 1:  PT Short Term Goal 1 (Week 1): Pt will perform supine<>sit with min assist PT Short Term Goal 2 (Week 1): Pt will perform sit<>stands using LRAD with min assist PT Short Term Goal 3 (Week 1): Pt will perform bed<>chair transfers using LRAD with min assist PT Short Term Goal 4 (Week 1): Pt will ambulate at least 53ft using LRAD with mod assist and +2 w/c follow if needed   Skilled Therapeutic Interventions/Progress Updates: Pt presented in bed agreeable to therapy. Pt states pain in low back, unrateable, rest breaks and positional changes provided as needed. TED hose donned and pants threaded total A. Pt performed rolling to R with modA and L with maxA to pull pants over hips. Pt performed supine to sit with mod/maxA and use of bed features with PTA providing assist for BLE management and truncal support. Pt noted to have heavy posterior lean which he was able to correct with verbal cues but unable to maintain. Pt participated in seated reaching at EOB for anterior lean with assist for LUE use. Pt then performed Sit to stand from elevated surface with minA and performed step pivot transfer to w/c with modA. Pt transferred to 4th rehab gym to parallel bars for energy conservation. Pt participated in Sit to stand x 3 with minA for BLE strengthening and general conditioning. Pt then ambulated ~94ft with minA in parallel bars. After extended seated rest transported to day room and participated in Cybex Kinetron 60cm/sec 2 x 31min bouts for general conditioning. Although arms not used during Kinetron pt stating increased pain in LUE. Pt transported back to room and pillows placed in w/c to provide LUE support with pt indicating some relief. PTA  notified nsg for increased LUE pain. Pt left in w/c at end of session with call bell within reach and wife present.   Tx2: Pt presented in bed with wife present agreeable to therapy. Pt states pain in shoulder is manageable an unrated, rests taken throughout session for pain management. Per discussion with Corinne Ports COTA, pt has been using bedpan vs Stedy. Initial part of session focused on use of Stedy to facilitate transfers. Pt performed supine to sit on L with modA for BLE management and truncal support. Once seated EOB pt able to scoot  anteriorly with minA and cues for increased anterior and lateral weight shifting. PTA donned shoes total A for time management and pt performed Sit to stand from EOB with minA. Pt was able to perform [facility] transfers with modA for controlled descent to sit and modA to power up to stand. Pt then performed Stedy transfer to w/c from [facility]. Pt transported to 4th rehab gym and participated in standing tolerance activity tossing bean bags. Pt required modA for stand but was able to increase anterior reach in standing and noted increased anterior weight shift. Pt unable to complete second stand due to fatigue therefore participated in additional reaching from w/c level placing horseshoes on basketball net. Pt noted to demonstrate increased reach with less effort through repetition. Pt transported back to room at end of session and agreeable to remain in w/c to eat lunch. Pt left with call bell within reach and wife present.      Therapy Documentation Precautions:  Precautions Precautions: Fall, Other (comment) Precaution Comments: monitor BP, HR, and SpO2; possible L shoulder RC injury ? Restrictions Weight Bearing Restrictions: No General:   Vital Signs: Therapy Vitals Temp: 97.9 F (36.6 C) Temp Source: Oral Pulse Rate: 87 Resp: 16 BP: (!) 105/59 Patient Position (if appropriate): Sitting Oxygen Therapy SpO2: 93 % O2 Device: Room Air Pain:   Mobility:    Locomotion :    Trunk/Postural Assessment :    Balance:   Exercises:   Other Treatments:      Therapy/Group: Individual Therapy  Varetta Chavers 09/21/2021, 4:03 PM

## 2021-09-21 NOTE — Progress Notes (Signed)
PHARMACY CONSULT NOTE FOR:  OUTPATIENT  PARENTERAL ANTIBIOTIC THERAPY (OPAT)  Indication: MSSA bacteremia Regimen: Cefazolin 2g IV every 8 hours End date: 10/08/21  IV antibiotic discharge orders are pended. To discharging provider:  please sign these orders via discharge navigator,  Select New Orders & click on the button choice - Manage This Unsigned Work.     Thank you for allowing pharmacy to be a part of this patients care.  Alycia Rossetti, PharmD, BCPS Clinical Pharmacist 09/21/2021 2:12 PM   **Pharmacist phone directory can now be found on [email or web address] ([email or web address]).  Listed under [place].

## 2021-09-21 NOTE — Progress Notes (Signed)
Occupational Therapy Session Note  Patient Details  Name: Leon Lyons MRN: [id number] Date of Birth: [birth year]  Today's Date: 09/21/2021 OT Individual Time: 1045-1200 OT Individual Time Calculation (min): 75 min    Short Term Goals: Week 1:  OT Short Term Goal 1 (Week 1): Pt will perform sit to stand with mod A 50% of time without the STEDY for clothing mangement OT Short Term Goal 2 (Week 1): Pt will be able to transfer with mod A 50% of the time to the toilet/ BSC OT Short Term Goal 3 (Week 1): PT will be able to thread LB clothing with min A with AE prn  Skilled Therapeutic Interventions/Progress Updates:  Pt greeted seated in w/c  agreeable to OT intervention. Session focus on therapeutic activities focused on functional sit<>stands and BLE  strengthening and endurance. Pt transported to gym with total A where pt attempted sit<>stand from w/c but unable to power into standing. Graded task down and worked on anterior weight shift needed to complete sit<>stand, pt instructed to lean anteriorly from w/c to reach for clothespins positioned on table, pt completed task with overall CGA. Pt needed multiple rest breaks in between reps. Pt then abel to stand from wc with MAX A with RW. Pt completed seated LB therex to increase strength and overall endurance for higher level BADLs with level 3 theraband: 2x10 LAQs 2x10 seated marches for 1 min each 2x10 hip ABD/ADD    Lengthy conversation started on decreasing caregiver burden as pt with concerns regarding how much assist his wife will need to provide, education provided on importance of communicating concerns with each other and having open conversations about how much assist the wife is willing to provide. Both wife and pt seem to be on the same page.  Pt transported back to room with total A for time mgmt, pt completed stand pivot transfer back to bed with MAX A to stand and MOD A to pivot from w/c>EOB, MOD A for sit>supine needing assist to  elevate BLEs. pt left supine in bed with all needs within reach,   Pt on RA during session SpO2 >86% during session, pt able to increase SpO2 to >90% with seated rest breaks and PLB.   Also switched out BSC with bari BSC as pt reports current BSC to narrow.                      Therapy Documentation Precautions:  Precautions Precautions: Fall, Other (comment) Precaution Comments: monitor BP, HR, and SpO2; possible L shoulder RC injury ? Restrictions Weight Bearing Restrictions: No    Pain: 6/10 pain reported in low back and LUE, provided rest breaks and repositioning with pain meds provided prior to session     Therapy/Group: Individual Therapy  [name] 09/21/2021, 12:20 PM

## 2021-09-21 NOTE — Patient Care Conference (Signed)
Inpatient RehabilitationTeam Conference and Plan of Care Update Date: 09/20/2021    Time: 10:38 AM    Patient Name: Leon Lyons      Medical Record Number: [id number]  Date of Birth: January 05, 1933 Sex: Male         Room/Bed: 6Y56L/8L37D-42 Payor Info: Payor: MEDICARE / Plan: MEDICARE PART A AND B / Product Type: *No Product type* /    Admit Date/Time:  09/17/2021  6:30 PM  Primary Diagnosis:  [facility] Problems: Principal Problem:   Debility    Expected Discharge Date: Expected Discharge Date: 10/11/21  Team Members Present: Physician leading conference: Dr. Alger Simons Social Worker Present: Loralee Pacas, [place] Nurse Present: Dorthula Nettles, RN PT Present: Tereasa Coop, PT OT Present: Cherylynn Ridges, OT PPS Coordinator present : Gunnar Fusi, SLP     Current Status/Progress Goal Weekly Team Focus  Bowel/Bladder   Continent of B/B. LBM 12/10. Requires assistance holding urinal.  Maintain continence. Reqain Strength to hold urinal.  Toilet PRN   Swallow/Nutrition/ Hydration             ADL's   Max+2 for all BADL tasks and transfers, very poor endurance  Min A  self-care retraining, sit<>stands, transfers, balance   Mobility             Communication             Safety/Cognition/ Behavioral Observations            Pain   C/o pain to LLE, back. PRN Available and effective.  Pain <3/10  Assess Qshift and PRN   Skin   Insicion to lower abd and left side groin, closed with skin glue. Remainder of skin intact.  Maintain skin integrity, and promote healing.  Assess Qshift and PRN     Discharge Planning:  D/c to home with 24/7 care from his wife April.   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 Palliative following. Max assist +2 for ADL's and transfers. Poor endurance. Mod/max assist for stand pivot. O2 desats into low/mid 80's. Medicated appropriately for pain. Patient on target to meet rehab goals: Min assist  goals  *See Care Plan and progress notes for long and short-term goals.   Revisions to Treatment Plan:  Not at this time.   Teaching Needs: Family education, medication/pain management, safety awareness, transfer/gait training, etc.  Current Barriers to Discharge: Decreased caregiver support, Home enviroment access/layout, Lack of/limited family support, Medication compliance, and Pending chemo/radiation  Possible Resolutions to Barriers: Family education SNF if indicated Order DME Follow up PT/OT     Medical Summary Current Status: debility after aortic injury after volvulus repair, lung cancer with bone and liver mets. PE  Barriers to Discharge: Medical stability   Possible Resolutions to [employer] Focus: daily assessment of labs and patient data. pain mgt, improve nutrition   Continued Need for Acute Rehabilitation Level of Care: The patient requires daily medical management by a physician with specialized training in physical medicine and rehabilitation for the following reasons: Direction of a multidisciplinary physical rehabilitation program to maximize functional independence : Yes Medical management of patient stability for increased activity during participation in an intensive rehabilitation regime.: Yes Analysis of laboratory values and/or radiology reports with any subsequent need for medication adjustment and/or medical intervention. : Yes   I attest that I was present, lead the team conference, and concur with the assessment and plan of the team.   Cristi Loron 09/21/2021, 10:54 AM

## 2021-09-21 NOTE — Progress Notes (Signed)
ANTICOAGULATION CONSULT NOTE - Follow Up Consult  Pharmacy Consult for Lovenox Indication:  Recent DVT, PE and afib with metastatic cancer  Allergies  Allergen Reactions   Diclofenac Sodium Other (See Comments)    Kidneys were affected    Patient Measurements: Height: 5\' 10"  (177.8 cm) Weight: 79.5 kg (175 lb 4.3 oz) IBW/kg (Calculated) : 73  Vital Signs: Temp: 97.7 F (36.5 C) (12/14 0305) BP: 126/74 (12/14 0305) Pulse Rate: 90 (12/14 0305)  Labs: Recent Labs    09/21/21 0345  HGB 9.7*  HCT 31.4*  PLT 255    Estimated Creatinine Clearance: 68.4 mL/min (by C-G formula based on SCr of 0.77 mg/dL).   Assessment:  Anticoag: enox incr 80mg  BID (for weight) as PTA for hx DVT and PE and afib - Hgb down to 9.7. Plts 255 up.  Goal of Therapy:  Anti-Xa level 0.6-1 units/ml 4hrs after LMWH dose given Monitor platelets by anticoagulation protocol: Yes   Plan:  Increase Lovenox to 80mg  BID (1mg /kg) q12 hrs for adequate treatment of recent VTE and afib. CBC q72h per LMWH protocol   Shaynna Husby S. Alford Highland, PharmD, BCPS Clinical Staff Pharmacist [email or web address]  Alford Highland, [place] 09/21/2021,9:16 AM

## 2021-09-21 NOTE — Progress Notes (Signed)
PROGRESS NOTE   Subjective/Complaints: Talked about patient's left shoulder pain. Forgot to mention it yesterday because it comes and goes. He tells me he had a steroid shot 2 mos ago. Not sure if voltaren gel helped.   ROS: Patient denies fever, rash, sore throat, blurred vision, nausea, vomiting, diarrhea, cough, shortness of breath or chest pain,  headache, or mood change.    Objective:   DG Shoulder Left  Result Date: 09/20/2021 CLINICAL DATA:  Shoulder pain EXAM: LEFT SHOULDER - 2+ VIEW COMPARISON:  Chest CT 08/15/2021 FINDINGS: AC joint is intact. Probable loculated left pleural effusion. No fracture or malalignment. Lytic lesion involving the inferior scapula. IMPRESSION: 1. Lytic lesion involving left scapula consistent with metastatic disease, present on CT from November 2. No acute osseous abnormality left shoulder 3. Moderate probably loculated left pleural effusion Electronically Signed   By: Donavan Foil M.D.   On: 09/20/2021 17:56   Recent Labs    09/21/21 0345  WBC 6.6  HGB 9.7*  HCT 31.4*  PLT 255    No results for input(s): NA, K, CL, CO2, GLUCOSE, BUN, CREATININE, CALCIUM in the last 72 hours.  Intake/Output Summary (Last 24 hours) at 09/21/2021 0837 Last data filed at 09/21/2021 0602 Gross per 24 hour  Intake 580 ml  Output 1750 ml  Net -1170 ml        Physical Exam: Vital Signs Blood pressure 126/74, pulse 90, temperature 97.7 F (36.5 C), resp. rate 18, height 5\' 10"  (1.778 m), weight 79.5 kg, SpO2 92 %.   Constitutional: No distress . Vital signs reviewed. HEENT: NCAT, EOMI, oral membranes moist Neck: supple Cardiovascular: IRR without murmur. No JVD    Respiratory/Chest: CTA Bilaterally without wheezes. Decreased sounds at bases. Normal effort    GI/Abdomen: BS +, non-tender, non-distended Ext: no clubbing, cyanosis, 1+ L>R LE edema Psych: pleasant and cooperative  Musculoskeletal:      Cervical back: Normal range of motion. No rigidity.    Left shoulder tender with ABD and passive ER/IR.  Neurological:     Comments: Intact to light touch in all 4 extremities B/L  CN's intact on exam. Good insight and awareness. Ox3- appropriate   RUE- 5-/5 in deltoid, biceps, triceps, WE, grip and FA LUE- deltoid 2-/5; weak vs pain; Biceps 4-/5; Triceps 3-/5; WE 4-/5 grip 4-/5; FA 4-/5 RLE- 4+/5 in HF, KE, KF DF and PF LLE- Proximal 3to 3+5; distal 4-/5  Skin:    General: Skin is warm and dry.     groin and abdominal wounds all dry, closed          Assessment/Plan: 1. Functional deficits which require 3+ hours per day of interdisciplinary therapy in a comprehensive inpatient rehab setting. Physiatrist is providing close team supervision and 24 hour management of active medical problems listed below. Physiatrist and rehab team continue to assess barriers to discharge/monitor patient progress toward functional and medical goals  Care Tool:  Bathing    Body parts bathed by patient: Right arm, Left arm, Chest, Face   Body parts bathed by helper: Abdomen, Front perineal area, Buttocks, Right upper leg, Left upper leg, Right lower leg, Left lower leg  Bathing assist Assist Level: Moderate Assistance - Patient 50 - 74%     Upper Body Dressing/Undressing Upper body dressing   What is the patient wearing?: Pull over shirt    Upper body assist Assist Level: Maximal Assistance - Patient 25 - 49%    Lower Body Dressing/Undressing Lower body dressing      What is the patient wearing?: Incontinence brief, Pants     Lower body assist Assist for lower body dressing: Total Assistance - Patient < 25%     Toileting Toileting    Toileting assist Assist for toileting: Total Assistance - Patient < 25% (required urinal)     Transfers Chair/bed transfer  Transfers assist  Chair/bed transfer activity did not occur: Refused  Chair/bed transfer assist level: Maximal Assistance  - Patient 25 - 49% Chair/bed transfer assistive device: Programmer, multimedia   Ambulation assist   Ambulation activity did not occur: Safety/medical concerns          Walk 10 feet activity   Assist  Walk 10 feet activity did not occur: Safety/medical concerns        Walk 50 feet activity   Assist Walk 50 feet with 2 turns activity did not occur: Safety/medical concerns         Walk 150 feet activity   Assist Walk 150 feet activity did not occur: Safety/medical concerns         Walk 10 feet on uneven surface  activity   Assist Walk 10 feet on uneven surfaces activity did not occur: Safety/medical concerns         Wheelchair     Assist Is the patient using a wheelchair?: No             Wheelchair 50 feet with 2 turns activity    Assist            Wheelchair 150 feet activity     Assist          Blood pressure 126/74, pulse 90, temperature 97.7 F (36.5 C), resp. rate 18, height 5\' 10"  (1.778 m), weight 79.5 kg, SpO2 92 %.  Assessment/Plan: Functional deficits secondary  to Debility from MSSA bactermia/PE/endovascular repair of aortic leak; PE and Previous styage IIIB lung CA with new hepatic and bone metastases.  ELOS-  21 days-supervision. Pt may shower -Continue CIR therapies including PT, OT  2. DVT/R lieofemoral DVT- Lovenox 70 mg BID- until CT in 4 weeks by Vascular with potential switch to oral 3. Pain Management: Tylenola nd Percocet as needed for pain- for LUE/LLE and back pain .   12/12 prn percocet,  Restarted Gabapentin 300 mg QHS-   12/14 left shoulder pain. Xray reviewed and demonstrates known lytic lesions. He's had chronic RTC problems and exam c/w this   -continue voltaren gel   -consider repeat steroid injection based on how he looks tomorrow. Pt agrees with plan 4. Mood:-  Palliative care following due to his cancer and ego support as required-  ?neuropsychology    5. Skin/Wound Care:  encouraging nutrition  -wounds healing 6. . Fluids/Electrolytes/Nutrition: encourage PO -pt is drinking protein supps  7. MSSA bacteremia- is on Cefazolin q8 hours for 4 weeks from 09/09/21 -thoracentesis culture are negative/pending thus far.  8. Persistent Afib- is recent diagnosis- Metoprolol just increased to 75 mg BID and on Diltiazem 120 mg daily- monitor for hypotension and hold meds if BP <100.   12/14 HR controlled 9. S/p aortic leak/endovascular repair- periaortic hematoma- vascular to  reorder at CT in 4 weeks to follow up 10. Previous Stage IIIB Lung CA- with new hepatic and bone mets- likely Stage IV-  -palliative care follow up appreciated -pt is DNR - F/U with Oncology and maybe hospice after d/c.  11. Constipation-    12/11- cleaned out- cont regimen 12. PICC line for IV ABX- as of 09/17/21 13. LE Edema--continue KHT's, elevation of limbs, improve nutrition               LOS: 4 days A FACE TO FACE EVALUATION WAS PERFORMED  Meredith Staggers 09/21/2021, 8:37 AM

## 2021-09-22 DIAGNOSIS — C61 Malignant neoplasm of prostate: Secondary | ICD-10-CM | POA: Diagnosis not present

## 2021-09-22 DIAGNOSIS — M25512 Pain in left shoulder: Secondary | ICD-10-CM | POA: Diagnosis not present

## 2021-09-22 DIAGNOSIS — R600 Localized edema: Secondary | ICD-10-CM | POA: Diagnosis not present

## 2021-09-22 DIAGNOSIS — R5381 Other malaise: Secondary | ICD-10-CM | POA: Diagnosis not present

## 2021-09-22 MED ORDER — SENNOSIDES-DOCUSATE SODIUM 8.6-50 MG PO TABS
2.0000 | ORAL_TABLET | Freq: Every day | ORAL | Status: DC
  Administered 2021-09-22 – 2021-10-10 (×18): 2 via ORAL
  Filled 2021-09-22 (×19): qty 2

## 2021-09-22 MED ORDER — FLEET ENEMA 7-19 GM/118ML RE ENEM
1.0000 | ENEMA | Freq: Once | RECTAL | Status: DC
  Filled 2021-09-22: qty 1

## 2021-09-22 MED ORDER — FLEET ENEMA 7-19 GM/118ML RE ENEM: 1.0000 | ENEMA | Freq: Every day | RECTAL | Status: DC | PRN

## 2021-09-22 NOTE — Progress Notes (Signed)
PROGRESS NOTE   Subjective/Complaints: Pt's shoulder not bothering him too much. Most of it's posterior today.  Legs still swelling.   ROS: Patient denies fever, rash, sore throat, blurred vision, nausea, vomiting, diarrhea, cough, shortness of breath or chest pain, headache, or mood change.    Objective:   DG Shoulder Left  Result Date: 09/20/2021 CLINICAL DATA:  Shoulder pain EXAM: LEFT SHOULDER - 2+ VIEW COMPARISON:  Chest CT 08/15/2021 FINDINGS: AC joint is intact. Probable loculated left pleural effusion. No fracture or malalignment. Lytic lesion involving the inferior scapula. IMPRESSION: 1. Lytic lesion involving left scapula consistent with metastatic disease, present on CT from November 2. No acute osseous abnormality left shoulder 3. Moderate probably loculated left pleural effusion Electronically Signed   By: Donavan Foil M.D.   On: 09/20/2021 17:56   Recent Labs    09/21/21 0345  WBC 6.6  HGB 9.7*  HCT 31.4*  PLT 255    No results for input(s): NA, K, CL, CO2, GLUCOSE, BUN, CREATININE, CALCIUM in the last 72 hours.  Intake/Output Summary (Last 24 hours) at 09/22/2021 1040 Last data filed at 09/22/2021 0750 Gross per 24 hour  Intake 839 ml  Output 2675 ml  Net -1836 ml        Physical Exam: Vital Signs Blood pressure 130/79, pulse 98, temperature 98.2 F (36.8 C), resp. rate 17, height 5\' 10"  (1.778 m), weight 79.5 kg, SpO2 93 %.   Constitutional: No distress . Vital signs reviewed. HEENT: NCAT, EOMI, oral membranes moist Neck: supple Cardiovascular: RRR without murmur. No JVD    Respiratory/Chest: CTA Bilaterally without wheezes or rales. Normal effort    GI/Abdomen: BS +, non-tender, non-distended Ext: no clubbing, cyanosis, 1++ LE edema Psych: pleasant and cooperative  Musculoskeletal:     Cervical back: Normal range of motion. No rigidity.    Left shoulder tender with ABD and passive ER/IR but  is pain is posterior  Neurological:     Comments: Intact to light touch in all 4 extremities B/L  CN's intact on exam. Good insight and awareness. Ox3- appropriate   RUE- 5-/5 in deltoid, biceps, triceps, WE, grip and FA LUE- deltoid 2-/5; weak vs pain; Biceps 4-/5; Triceps 3-/5; WE 4-/5 grip 4-/5; FA 4-/5 RLE- 4+/5 in HF, KE, KF DF and PF LLE- Proximal 3to 3+5; distal 4-/5  Skin:    General: Skin is warm and dry.     groin and abdominal wounds all CDI          Assessment/Plan: 1. Functional deficits which require 3+ hours per day of interdisciplinary therapy in a comprehensive inpatient rehab setting. Physiatrist is providing close team supervision and 24 hour management of active medical problems listed below. Physiatrist and rehab team continue to assess barriers to discharge/monitor patient progress toward functional and medical goals  Care Tool:  Bathing    Body parts bathed by patient: Right arm, Left arm, Chest, Face   Body parts bathed by helper: Abdomen, Front perineal area, Buttocks, Right upper leg, Left upper leg, Right lower leg, Left lower leg     Bathing assist Assist Level: Moderate Assistance - Patient 50 - 74%  Upper Body Dressing/Undressing Upper body dressing   What is the patient wearing?: Pull over shirt    Upper body assist Assist Level: Maximal Assistance - Patient 25 - 49%    Lower Body Dressing/Undressing Lower body dressing      What is the patient wearing?: Incontinence brief, Pants     Lower body assist Assist for lower body dressing: Total Assistance - Patient < 25%     Toileting Toileting    Toileting assist Assist for toileting: Total Assistance - Patient < 25% (required urinal)     Transfers Chair/bed transfer  Transfers assist  Chair/bed transfer activity did not occur: Refused  Chair/bed transfer assist level: Moderate Assistance - Patient 50 - 74% Chair/bed transfer assistive device: Armrests, Arboriculturist   Ambulation assist   Ambulation activity did not occur: Safety/medical concerns          Walk 10 feet activity   Assist  Walk 10 feet activity did not occur: Safety/medical concerns        Walk 50 feet activity   Assist Walk 50 feet with 2 turns activity did not occur: Safety/medical concerns         Walk 150 feet activity   Assist Walk 150 feet activity did not occur: Safety/medical concerns         Walk 10 feet on uneven surface  activity   Assist Walk 10 feet on uneven surfaces activity did not occur: Safety/medical concerns         Wheelchair     Assist Is the patient using a wheelchair?: No             Wheelchair 50 feet with 2 turns activity    Assist            Wheelchair 150 feet activity     Assist          Blood pressure 130/79, pulse 98, temperature 98.2 F (36.8 C), resp. rate 17, height 5\' 10"  (1.778 m), weight 79.5 kg, SpO2 93 %.  Assessment/Plan: Functional deficits secondary  to Debility from MSSA bactermia/PE/endovascular repair of aortic leak; PE and Previous styage IIIB lung CA with new hepatic and bone metastases.  ELOS-  21 days-supervision. Pt may shower -Continue CIR therapies including PT, OT  2. DVT/R lieofemoral DVT- Lovenox 70 mg BID- until CT in 4 weeks by Vascular with potential switch to oral 3. Pain Management: Tylenola nd Percocet as needed for pain- for LUE/LLE and back pain .   12/12 prn percocet,  Restarted Gabapentin 300 mg QHS-   12/14 left shoulder pain. Xray reviewed and demonstrates known lytic lesions. He's had chronic RTC problems and exam has been c/w this   -continue voltaren gel   -consider steroid injection but suspect a lot of pain is related to bony mets on scapula. 4. Mood:-  Palliative care following due to his cancer and ego support as required-  ?neuropsychology    5. Skin/Wound Care: encouraging nutrition  -wounds healing 6. .  Fluids/Electrolytes/Nutrition: encourage PO -pt is drinking protein supps  7. MSSA bacteremia- is on Cefazolin q8 hours for 4 weeks from 09/09/21 -thoracentesis cultures are negative. Fungal cx with gs still pending  8. Persistent Afib- is recent diagnosis- Metoprolol just increased to 75 mg BID and on Diltiazem 120 mg daily- monitor for hypotension and hold meds if BP <100.   12/15 HR controlled 9. S/p aortic leak/endovascular repair- periaortic hematoma- vascular to reorder at CT in 4 weeks  to follow up 10. Previous Stage IIIB Lung CA- with new hepatic and bone mets- likely Stage IV-  -palliative care follow up appreciated -pt is DNR - F/U with Oncology and maybe hospice after d/c.  11. Constipation-    12/11- cleaned out- wife states he goes 3-4 days between BM's at home   -having some gas, will try fleet enema 12. PICC line for IV ABX- as of 09/17/21 13. LE Edema--continue KHT's, elevation of limbs, improve nutrition     -will try HS ACE wraps            LOS: 5 days A FACE TO FACE EVALUATION WAS PERFORMED  Meredith Staggers 09/22/2021, 10:40 AM

## 2021-09-22 NOTE — Progress Notes (Signed)
Physical Therapy Session Note  Patient Details  Name: Leon Lyons MRN: [id number] Date of Birth: 14-May-1933  Today's Date: 09/22/2021 PT Individual Time: 1100-1150 PT Individual Time Calculation (min): 50 min   Short Term Goals: Week 1:  PT Short Term Goal 1 (Week 1): Pt will perform supine<>sit with min assist PT Short Term Goal 2 (Week 1): Pt will perform sit<>stands using LRAD with min assist PT Short Term Goal 3 (Week 1): Pt will perform bed<>chair transfers using LRAD with min assist PT Short Term Goal 4 (Week 1): Pt will ambulate at least 25ft using LRAD with mod assist and +2 w/c follow if needed  Skilled Therapeutic Interventions/Progress Updates:    Session focused on addressing overall activity and standing tolerance utilizing standing frame. Education provided throughout session on overall goals and progression of mobility. Pt limited by low endurance and requires extended rest breaks to recover between activities. Focused on use of standing frame to increase ability and length of standing endurance with progression of dynamic activities including marching in place and weightshifting for pre-gait activities. Pt able to tolerate initially about 1 min, then 2 min, then 3 min, and then 2 min 45 seconds with seated breaks in between. While in standing cues provided for increased activation through glutes and trunk extension. Pt looked great and was not requiring much support from sling in standing (just during the transition). Wife present throughout session and educated as well and very engaged.   Therapy Documentation Precautions:  Precautions Precautions: Fall, Other (comment) Precaution Comments: monitor BP, HR, and SpO2; possible L shoulder RC injury ? Restrictions Weight Bearing Restrictions: No    Pain: Denies pain. Reports nausea. Reports nausea had decreased some by end of session.     Therapy/Group: Individual Therapy  Canary Brim Ivory Broad, PT,  DPT, CBIS  09/22/2021, 11:55 AM

## 2021-09-22 NOTE — Progress Notes (Signed)
Physical Therapy Session Note  Patient Details  Name: Leon Lyons MRN: [id number] Date of Birth: [birth year]  Today's Date: 09/22/2021 PT Individual Time: 1300-1353 PT Individual Time Calculation (min): 53 min   Short Term Goals: Week 1:  PT Short Term Goal 1 (Week 1): Pt will perform supine<>sit with min assist PT Short Term Goal 2 (Week 1): Pt will perform sit<>stands using LRAD with min assist PT Short Term Goal 3 (Week 1): Pt will perform bed<>chair transfers using LRAD with min assist PT Short Term Goal 4 (Week 1): Pt will ambulate at least 38ft using LRAD with mod assist and +2 w/c follow if needed  Skilled Therapeutic Interventions/Progress Updates:    Pt seated in w/c on arrival and agreeable to therapy. [name] present throughout session. Pt reported 5/10 shoulder pain at rest, 8/10 with activity and unrated back pain, premedicated. Rest and positioning provided as needed. Pt propelled w/c x 25 ft with RUE primarily. Discontinued d/t shoulder pain. Pt transported to gym. Session focused on Sit to stand progression. Pt instructed on and performed anteroir weight shift for part practice of Sit to stand and isometric LE strength. Progress to partial stands with stedy with max A x 1. Pt reported back pain with this activity. Transitioned to BLE exercise, including marches and LAQ to fatigue. Pt returned to room and set up for squat pivot transfer to bed toward R side. Pt performed transfer with mod A x2, VC for application of anterior weight shift from earlier in session. Pt then performed Sit to stand x 3 from elevated bed with max A x 2 fading to mod A x1. Pt then returned to bed with mod A for BLE and trunk management. Pt remained in supine with his wife present and was left with all needs in reach and alarm active. s  Therapy Documentation Precautions:  Precautions Precautions: Fall, Other (comment) Precaution Comments: monitor BP, HR, and SpO2; possible L shoulder RC injury  ? Restrictions Weight Bearing Restrictions: No    Therapy/Group: Individual Therapy  Mickel Fuchs 09/22/2021, 2:01 PM

## 2021-09-22 NOTE — Progress Notes (Signed)
Occupational Therapy Session Note  Patient Details  Name: Leon Lyons MRN: [id number] Date of Birth: [birth year]  Today's Date: 09/22/2021 OT Individual Time: 0847-1000 OT Individual Time Calculation (min): 73 min    Short Term Goals: Week 1:  OT Short Term Goal 1 (Week 1): Pt will perform sit to stand with mod A 50% of time without the STEDY for clothing mangement OT Short Term Goal 2 (Week 1): Pt will be able to transfer with mod A 50% of the time to the toilet/ BSC OT Short Term Goal 3 (Week 1): PT will be able to thread LB clothing with min A with AE prn  Skilled Therapeutic Interventions/Progress Updates:    Pt greeted semi reclined in bed and agreeable to OT treatment session. Pt with pitting edema in B LE's. OT educated pt's spouse on friction reducing bag technique to don TED hose. LB bathing completed at bed level with pt able to reach and wash peri-area, then mod A for rolling while OT washed buttocks and donned clean brief. Mod A to get to sitting EOB with pt reporting pain in L shoulder and back. Max A to thread pants seated EOB but pt able to reach and help pull up to knees. Sit<>stand in [place] from raised bed with max A, then OT assist to pull up pants. Pt reported pain in legs too when standing. Stedy transfer to wc. Pt brought to the sink for UB bathing/dressing. Pt with difficulty lifting L shoulder due to pain requiring min A from OT. Education for dressing techniques with painful L shoulder. Grooming tasks seated in wc with min set-up A. Pt agreeable to try 2 more sit<>stand in [place]. Pt needed 2 attempts and max A to get to standing and only tolerated 30 second intervals in standing. Pt left seated in wc at end of session with alarm belt on, spouse present and needs met.  Therapy Documentation Precautions:  Precautions Precautions: Fall, Other (comment) Precaution Comments: monitor BP, HR, and SpO2; possible L shoulder RC injury ? Restrictions Weight Bearing  Restrictions: No Pain: Pain Assessment Pain Scale: 0-10 Pain Score: 8   Therapy/Group: Individual Therapy  Valma Cava 09/22/2021, 10:03 AM

## 2021-09-23 ENCOUNTER — Ambulatory Visit: Payer: Self-pay | Admitting: *Deleted

## 2021-09-23 MED ORDER — OXYCODONE-ACETAMINOPHEN 5-325 MG PO TABS
1.0000 | ORAL_TABLET | Freq: Every day | ORAL | Status: DC
  Administered 2021-09-24 – 2021-10-04 (×11): 1 via ORAL
  Filled 2021-09-23 (×10): qty 1

## 2021-09-23 MED ORDER — LIDOCAINE 5 % EX PTCH
2.0000 | MEDICATED_PATCH | CUTANEOUS | Status: DC
  Administered 2021-09-23 – 2021-09-29 (×6): 2 via TRANSDERMAL
  Filled 2021-09-23 (×7): qty 2

## 2021-09-23 NOTE — Progress Notes (Signed)
Physical Therapy Session Note  Patient Details  Name: Leon Lyons MRN: [id number] Date of Birth: 1933-03-02  Today's Date: 09/23/2021 PT Individual Time: 1300-1400 PT Individual Time Calculation (min): 60 min   Short Term Goals: Week 1:  PT Short Term Goal 1 (Week 1): Pt will perform supine<>sit with min assist PT Short Term Goal 2 (Week 1): Pt will perform sit<>stands using LRAD with min assist PT Short Term Goal 3 (Week 1): Pt will perform bed<>chair transfers using LRAD with min assist PT Short Term Goal 4 (Week 1): Pt will ambulate at least 28ft using LRAD with mod assist and +2 w/c follow if needed  Skilled Therapeutic Interventions/Progress Updates: Pt presents sitting in w/c and agreeable to therapy, but states very fatigued from therapies.  Pt wheeled self out of room and 40' x 2 trials w/ LEs only.  Pt requires extended seated rest breaks 2/2 fatigue throughout session.  O2 on RA remains > 95% w/ HR in 70-80s w/ activity throughout session.  PT completed remaining distance to small gym.  Pt performed UBE 1' x 3 trials w/ RUE only at Level 1.  Pt wheeled to main gym for energy and time conservation.  Pt performed seated reaching outside of BOS to stack and unstack cones at varying heights w/ R UE only.  Pt returned to room and remained sitting in w/c for next therapy session.  Spouse present for entire session.  All needs in reach.     Therapy Documentation Precautions:  Precautions Precautions: Fall, Other (comment) Precaution Comments: monitor BP, HR, and SpO2; possible L shoulder RC injury ? Restrictions Weight Bearing Restrictions: No General:   Vital Signs:   Pain: 4/10 to LB and L shoulder       Therapy/Group: Individual Therapy  [name] 09/23/2021, 2:02 PM

## 2021-09-23 NOTE — Progress Notes (Addendum)
PROGRESS NOTE   Subjective/Complaints: No new issues. Left shoulder pain comes and goes. Voltaren helps a little. ACE wraps not applied last night. Moving bowels after enema. Also complaining of low back pain.   ROS: Patient denies fever, rash, sore throat, blurred vision, nausea, vomiting, diarrhea, cough, shortness of breath or chest pain,   headache, or mood change.     Objective:   No results found. Recent Labs    09/21/21 0345  WBC 6.6  HGB 9.7*  HCT 31.4*  PLT 255    No results for input(s): NA, K, CL, CO2, GLUCOSE, BUN, CREATININE, CALCIUM in the last 72 hours.  Intake/Output Summary (Last 24 hours) at 09/23/2021 1134 Last data filed at 09/23/2021 0643 Gross per 24 hour  Intake 250 ml  Output 1625 ml  Net -1375 ml        Physical Exam: Vital Signs Blood pressure 130/76, pulse 80, temperature 98.2 F (36.8 C), resp. rate 18, height 5\' 10"  (1.778 m), weight 72.4 kg, SpO2 92 %.   Constitutional: No distress . Vital signs reviewed. HEENT: NCAT, EOMI, oral membranes moist Neck: supple Cardiovascular: RRR without murmur. No JVD    Respiratory/Chest: CTA Bilaterally without wheezes or rales. Normal effort    GI/Abdomen: BS +, non-tender, non-distended Ext: no clubbing, cyanosis, 1+ BLE edema Psych: pleasant and cooperative  Musculoskeletal:     Cervical back: Normal range of motion. No rigidity.    Left shoulder tender with ABD and passive ER/IR but is pain is posterior along scapula  Neurological:     Comments: Intact to light touch in all 4 extremities B/L  CN's intact on exam. Good insight and awareness. Ox3- appropriate   RUE- 5-/5 in deltoid, biceps, triceps, WE, grip and FA LUE- deltoid 2-/5; weak vs pain; Biceps 4-/5; Triceps 3-/5; WE 4-/5 grip 4-/5; FA 4-/5 RLE- 4+/5 in HF, KE, KF DF and PF LLE- Proximal 3to 3+5; distal 4-/5, struggles still with sit to stand transfers Skin:    General: Skin is  warm and dry.     groin and abdominal wounds all CDI          Assessment/Plan: 1. Functional deficits which require 3+ hours per day of interdisciplinary therapy in a comprehensive inpatient rehab setting. Physiatrist is providing close team supervision and 24 hour management of active medical problems listed below. Physiatrist and rehab team continue to assess barriers to discharge/monitor patient progress toward functional and medical goals  Care Tool:  Bathing    Body parts bathed by patient: Right arm, Left arm, Chest, Face   Body parts bathed by helper: Abdomen, Front perineal area, Buttocks, Right upper leg, Left upper leg, Right lower leg, Left lower leg     Bathing assist Assist Level: Moderate Assistance - Patient 50 - 74%     Upper Body Dressing/Undressing Upper body dressing   What is the patient wearing?: Pull over shirt    Upper body assist Assist Level: Maximal Assistance - Patient 25 - 49%    Lower Body Dressing/Undressing Lower body dressing      What is the patient wearing?: Pants     Lower body assist Assist for lower body  dressing: Maximal Assistance - Patient 25 - 49%     Toileting Toileting    Toileting assist Assist for toileting: Total Assistance - Patient < 25% (required urinal)     Transfers Chair/bed transfer  Transfers assist  Chair/bed transfer activity did not occur: Refused  Chair/bed transfer assist level: Minimal Assistance - Patient > 75% Chair/bed transfer assistive device: Armrests, Programmer, multimedia   Ambulation assist   Ambulation activity did not occur: Safety/medical concerns          Walk 10 feet activity   Assist  Walk 10 feet activity did not occur: Safety/medical concerns        Walk 50 feet activity   Assist Walk 50 feet with 2 turns activity did not occur: Safety/medical concerns         Walk 150 feet activity   Assist Walk 150 feet activity did not occur: Safety/medical  concerns         Walk 10 feet on uneven surface  activity   Assist Walk 10 feet on uneven surfaces activity did not occur: Safety/medical concerns         Wheelchair     Assist Is the patient using a wheelchair?: No             Wheelchair 50 feet with 2 turns activity    Assist            Wheelchair 150 feet activity     Assist          Blood pressure 130/76, pulse 80, temperature 98.2 F (36.8 C), resp. rate 18, height 5\' 10"  (1.778 m), weight 72.4 kg, SpO2 92 %.  Assessment/Plan: Functional deficits secondary  to Debility from MSSA bactermia/PE/endovascular repair of aortic leak; PE and Previous styage IIIB lung CA with new hepatic and bone metastases.  ELOS-  10/11/21 Pt may shower -Continue CIR therapies including PT, OT  2. DVT/R lieofemoral DVT- Lovenox 70 mg BID- until CT in 4 weeks by Vascular with potential switch to oral 3. Pain Management: Tylenola nd Percocet as needed for pain- for LUE/LLE and back pain .   12/12 prn percocet,  Restarted Gabapentin 300 mg QHS-   12/16 left shoulder pain. Xray reviewed and demonstrates known lytic lesions. 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 Discussed with pt/spouse today   -add lidocaine patches for low back   -will schedule one percocet daily at 0700 prior to therapy 4. Mood:-  Palliative care following due to his cancer and ego support as required-  ?neuropsychology    5. Skin/Wound Care: encouraging nutrition  -wounds healing 6. . Fluids/Electrolytes/Nutrition: encourage PO -pt is drinking protein supps  7. MSSA bacteremia- is on Cefazolin q8 hours for 4 weeks from 09/09/21 -thoracentesis cultures are negative. Fungal cx with gs still pending  8. Persistent Afib- is recent diagnosis- Metoprolol just increased to 75 mg BID and on Diltiazem 120 mg daily- monitor  for hypotension and hold meds if BP <100.   12/16 HR controlled 9. S/p aortic leak/endovascular repair- periaortic hematoma- vascular to reorder at CT in 4 weeks to follow up 10. Previous Stage IIIB Lung CA- with new hepatic and bone mets- likely Stage IV-  -palliative care follow up appreciated -pt is DNR - F/U with Oncology and maybe hospice after d/c.  11. Constipation-  12/11- cleaned out- wife states he goes 3-4 days between BM's at home   -having some gas, will try fleet enema 12. PICC line for IV ABX- as of 09/17/21 13. LE Edema--continue KHT's, elevation of limbs, improve nutrition     -asking nursing again to apply HS ACE wraps            LOS: 6 days A FACE TO FACE EVALUATION WAS PERFORMED  Meredith Staggers 09/23/2021, 11:34 AM

## 2021-09-23 NOTE — Progress Notes (Signed)
Orthopedic Tech Progress Note Patient Details:  Leon Lyons [birth year] [id number]  Patient ID: Leon Lyons, male   DOB: [birth year], 87 y.o.   MRN: [id number] Spoke with RN via secure chat and asked if they would call once [name] finished dinner, so we could come apply the ace wraps.  Vernona Rieger 09/23/2021, 5:20 PM

## 2021-09-23 NOTE — Progress Notes (Signed)
Occupational Therapy Session Note  Patient Details  Name: Leon Lyons MRN: [id number] Date of Birth: 1933-01-28  Today's Date: 09/23/2021 OT Individual Time: 1419-1500 OT Individual Time Calculation (min): 41 min   Short Term Goals: Week 1:  OT Short Term Goal 1 (Week 1): Pt will perform sit to stand with mod A 50% of time without the STEDY for clothing mangement OT Short Term Goal 2 (Week 1): Pt will be able to transfer with mod A 50% of the time to the toilet/ BSC OT Short Term Goal 3 (Week 1): PT will be able to thread LB clothing with min A with AE prn  Skilled Therapeutic Interventions/Progress Updates:    Pt greeted seated in wc and agreeable to OT treatment session. Pt reported he has had a full day of therapy and only had "a quarter of a tank left", referring to his energy. Pt brought to therapy gym and completed 3 sets of 10 seated knee extension, hip flexion, and glute squeezes. Pt brought back to room and addressed sit<> stand using [place] with mod A of 1 to power up. Pt with improved hip and trunk extension and tolerated standing for 90 seconds. Addressed weight shifting and marching in place in [place] for pre-gait activity. Pt with good weight shift and only occasional L knee buckle into St[place]. Pt completed a few more sit<>stands from perched stedy seat. Pt agreeable to perform standing marches in stedy again for 30 seconds. Pt returned to bed using Stedy. He needed max A to lift B LE's back into bed due to fatigue. Pt left semi-reclined in bed with bed alarm on, call bell in reach, spouse and nursing present.   Therapy Documentation Precautions:  Precautions Precautions: Fall, Other (comment) Precaution Comments: monitor BP, HR, and SpO2; possible L shoulder RC injury ? Restrictions Weight Bearing Restrictions: No  Pain: Pt reports L shoulder pain, but has improved with voltaren gel and lidocain patch nursing placed earlier.   Therapy/Group: Individual  Therapy  Valma Cava 09/23/2021, 3:02 PM

## 2021-09-23 NOTE — Progress Notes (Signed)
Orthopedic Tech Progress Note Patient Details:  Leon Lyons [birth year] [id number]  Ortho Devices Type of Ortho Device: Ace wrap Ortho Device/Splint Location: ble Ortho Device/Splint Interventions: Ordered, Application, Adjustment   Post Interventions Patient Tolerated: Well  Charline Bills [name] 09/23/2021, 5:57 PM Applied ace wraps to patients legs

## 2021-09-23 NOTE — Progress Notes (Signed)
[facility] for Infectious Disease  Date of Admission:  09/17/2021   Total days of inpatient antibiotics 7  Principal Problem:   Debility          Assessment:  88 YM with non-small cell lung cancer of LUL Ds in 2022 SP chemotherapy admitted for weakness/anemia. Blood Cx+ MRSA. CT showed aortic perforation along with hepatic metastasis and moderate left and right pleural effusion. Taken to OR for EVAR with vascular surgery.  Recent hospitalization: Pt had recently been diagnosed with afib and PE been taken to OR for volvulus.  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 Denies any prior Hx of of infectious process, denies hardware. Etiology of bacteremia is unclear, could be hospital acquired as he had a recent intervention for volvulus(lap procedure in October 2022 at [facility]).  -TTE showed mild thickening of the aortic valve. TEE showed no vegetation -PE with right ileofemoral DVT on anticoagulation -Pt had been complaining of left knee pain starting during admission, MRI left knee did not showed signs of infection Recommendations: -Continue cefazolin, anticipate 4 weeks of antibiotics from negative Cx (12/3-12/30) .   #Enlarging left pleural effusion with collapse of left lower lobe SP thoracentesis on 12/8 with negative Cx #left upper lobe progressive consolidation #New hepatic metastasis in a lung cancer patient  - cytology pending. Gram stain+ GNR.  - Suspect enlarging effusion in the setting of malignancy.  Recommendations -Follow cytology  #Deep pelvic abscess 2.6x4.0 cm(decreasing in size) -Per CT read pt has resolving abscess in the pelvis.  -Per wife at bedside one abscess was drained  a few weeks ago at [facility] with negative Cx, no antibiotics given post procedure.   ID will sign off.  ID appointment  scheduled on 10/21/20 at 2pm.  Microbiology:   Antibiotics: Azithromycin 12/2 Ceftriaxone 12/2 Cefazolin 12/3-p  Cultures: Blood 12/2 2/2 MSSA 12/3 NGTD Urine 12/2 NGTD    SUBJECTIVE: Pt attempting to get on commode with assistance. Wife at bedside.   Interval: Remains afebrile  Review of Systems: Review of Systems  All other systems reviewed and are negative.   Scheduled Meds:  Chlorhexidine Gluconate Cloth  6 each Topical Daily   diclofenac Sodium  2 g Topical TID   diltiazem  120 mg Oral Daily   enoxaparin (LOVENOX) injection  80 mg Subcutaneous Q12H   feeding supplement  237 mL Oral BID BM   folic acid  1 mg Oral Daily   gabapentin  300 mg Oral QHS   lidocaine  2 patch Transdermal Q24H   mouth rinse  15 mL Mouth Rinse BID   metoprolol tartrate  50 mg Oral BID   multivitamin with minerals  1 tablet Oral Daily   [START ON 09/24/2021] oxyCODONE-acetaminophen  1 tablet Oral Q0600   pantoprazole  40 mg Oral Daily   rosuvastatin  5 mg Oral Daily   senna-docusate  2 tablet Oral QHS   sodium chloride flush  10-40 mL Intracatheter Q12H   sodium phosphate  1 enema Rectal Once   Continuous Infusions:  sodium chloride 10 mL/hr at 09/21/21 1517    ceFAZolin (ANCEF) IV 2 g (09/23/21 0521)   PRN Meds:.sodium chloride, acetaminophen **OR** acetaminophen, alum & mag hydroxide-simeth, bisacodyl, cyclobenzaprine, guaiFENesin-dextromethorphan, heparin lock flush, ondansetron **OR** ondansetron (ZOFRAN) IV, oxyCODONE-acetaminophen, phenol, polyethylene glycol, sodium chloride, sodium chloride flush,  sodium phosphate Allergies  Allergen Reactions   Diclofenac Sodium Other (See Comments)    Kidneys were affected    OBJECTIVE: Vitals:   09/22/21 2006 09/23/21 0305 09/23/21 0436 09/23/21 0609  BP: 125/70 125/88 130/76   Pulse: 94 93 80   Resp: 20 17 18    Temp: 97.9 F (36.6 C) 97.6 F (36.4 C) 98.2 F (36.8 C)   TempSrc: Oral     SpO2: 93% 92%    Weight:    72.4 kg   Height:       Body mass index is 22.9 kg/m.  Physical Exam Constitutional:      General: He is not in acute distress.    Appearance: He is normal weight. He is not toxic-appearing.  HENT:     Head: Normocephalic and atraumatic.     Right Ear: External ear normal.     Left Ear: External ear normal.     Nose: No rhinorrhea.  Eyes:     Extraocular Movements: Extraocular movements intact.     Conjunctiva/sclera: Conjunctivae normal.     Pupils: Pupils are equal, round, and reactive to light.  Musculoskeletal:        General: Normal range of motion.     Cervical back: Normal range of motion.  Neurological:     General: No focal deficit present.     Mental Status: He is oriented to person, place, and time.  Psychiatric:        Mood and Affect: Mood normal.      Lab Results Lab Results  Component Value Date   WBC 6.6 09/21/2021   HGB 9.7 (L) 09/21/2021   HCT 31.4 (L) 09/21/2021   MCV 98.4 09/21/2021   PLT 255 09/21/2021    Lab Results  Component Value Date   CREATININE 0.77 09/14/2021   BUN 12 09/14/2021   NA 134 (L) 09/14/2021   K 3.5 09/14/2021   CL 106 09/14/2021   CO2 20 (L) 09/14/2021    Lab Results  Component Value Date   ALT 13 09/09/2021   AST 14 (L) 09/09/2021   ALKPHOS 83 09/09/2021   BILITOT 0.3 09/09/2021        Laurice Record, [place] for Infectious Disease [facility] [facility] 09/23/2021, 2:12 PM

## 2021-09-23 NOTE — Progress Notes (Signed)
Occupational Therapy Session Note  Patient Details  Name: Leon Lyons MRN: [id number] Date of Birth: [birth year]  Today's Date: 09/23/2021 OT Individual Time: 0802-0900 OT Individual Time Calculation (min): 58 min   OT Individual Time: 1115-1200 OT Individual Time Calculation (min): 45 min   Short Term Goals: Week 1:  OT Short Term Goal 1 (Week 1): Pt will perform sit to stand with mod A 50% of time without the STEDY for clothing mangement OT Short Term Goal 2 (Week 1): Pt will be able to transfer with mod A 50% of the time to the toilet/ BSC OT Short Term Goal 3 (Week 1): PT will be able to thread LB clothing with min A with AE prn  Session 1: Patient met lying supine in bed in agreement with OT treatment session. 6/10 pain in L shoulder and L calf reported at rest and with activity. Patient awaiting med pass at start of session. Total A don TED hose in supine. Patient able to roll to L and advance BLE from bed surface to EOB. Assist required to elevate trunk with HOB slightly elevated. At EOB patient reporting urge to void bladder. Set-up with urinal but patient unable to void bladder after extended time. Patient reporting some frustration that wife is not present in room. This Probation officer provided emotional support. Patient required Max A to thread BLE through pants seated EOB and Max-Mod A for sit to stand to hike over hips in standing. Patient with poor activity tolerance required frequent and extended rest breaks between tasks. Blocked practice for static standing balance/tolerance with patient able to stand x5 trials with EOB elevated and Mod A, for up to 30-45 seconds each bout. Limited by fatigue, low back pain and ?anxiety?. Stand-pivot to wc on L with Min A. Session concluded with patient seated in wc with call bell within reach, belt alarm activated and all needs met.   Session 2: Patient/family met with patient lying supine in bed. Time spent assessing patient/family goals. Wife  reports ability to provide up to [facility] external assist for ADLs but would prefer patient to d/c at [place] level. Light Mod A at trunk to come to EOB and Max A for sit to stand from EOB positioned in lowest setting. Session with focus on anterior weight shifting in sitting, anterior/posterior scooting, sit to stand transfers, static standing balance with unilateral/bilateral UE support and reaching outside of BOS in prep for ADLs. Max A and multimodal cues for anterior weight shift with sit to stands x3 trials. Session concluded with patient seated in wc with call bell within reach and all needs met.    Therapy Documentation Precautions:  Precautions Precautions: Fall, Other (comment) Precaution Comments: monitor BP, HR, and SpO2; possible L shoulder RC injury ? Restrictions Weight Bearing Restrictions: No General:    Therapy/Group: Individual Therapy  [name] 09/23/2021, 6:40 AM

## 2021-09-24 ENCOUNTER — Inpatient Hospital Stay (HOSPITAL_COMMUNITY): Payer: Medicare Other

## 2021-09-24 LAB — CBC
HCT: 30.1 % — ABNORMAL LOW (ref 39.0–52.0)
Hemoglobin: 9.1 g/dL — ABNORMAL LOW (ref 13.0–17.0)
MCH: 29.9 pg (ref 26.0–34.0)
MCHC: 30.2 g/dL (ref 30.0–36.0)
MCV: 99 fL (ref 80.0–100.0)
Platelets: 282 10*3/uL (ref 150–400)
RBC: 3.04 MIL/uL — ABNORMAL LOW (ref 4.22–5.81)
RDW: 16.7 % — ABNORMAL HIGH (ref 11.5–15.5)
WBC: 7.3 10*3/uL (ref 4.0–10.5)
nRBC: 0 % (ref 0.0–0.2)

## 2021-09-24 MED ORDER — DICLOFENAC EPOLAMINE 1.3 % EX PTCH
1.0000 | MEDICATED_PATCH | Freq: Two times a day (BID) | CUTANEOUS | Status: DC
  Administered 2021-09-24 – 2021-10-11 (×35): 1 via TRANSDERMAL
  Filled 2021-09-24 (×35): qty 1

## 2021-09-24 NOTE — Progress Notes (Signed)
Physical Therapy Session Note  Patient Details  Name: Leon Lyons MRN: [id number] Date of Birth: 02/10/1933  Today's Date: 09/24/2021 PT Individual Time: 0917-1015 PT Individual Time Calculation (min): 58 min   Short Term Goals: Week 1:  PT Short Term Goal 1 (Week 1): Pt will perform supine<>sit with min assist PT Short Term Goal 2 (Week 1): Pt will perform sit<>stands using LRAD with min assist PT Short Term Goal 3 (Week 1): Pt will perform bed<>chair transfers using LRAD with min assist PT Short Term Goal 4 (Week 1): Pt will ambulate at least 6ft using LRAD with mod assist and +2 w/c follow if needed  Skilled Therapeutic Interventions/Progress Updates: Pt presents supine in bed and agreeable to therapy.  Pt performed rolling to left w/ min A and verbal cues but required mod A for sidelying to sit 2/2 affected side.  Pt returned to sidelying w/ mod A and then rolled to right as at home.  Pt still required mod to min A for sidelying to sit.  PT elevated bed to height of bed at home per spouse and pt, requiring only min A for sit to stand and then stepped to w/c w/ RW and min A.  Pt stood w/ Charlaine Dalton and mod A for marching in place w/ cueing for decreased reliance on UEs. Pt practiced sit to stand from [employer] w/ min A.  O2 sats remained > 95% on RA w/ cueing for breathing technique, although previous, O2 decreased to 85%.  HR elevated to 120 bpm and then decreased into 90's.  Pt requires extended seated rest breaks w/ cueing for breathing techniques.  Pt remained sitting in w/c w/ all needs in reach, spouse present throughout session.      Therapy Documentation Precautions:  Precautions Precautions: Fall, Other (comment) Precaution Comments: monitor BP, HR, and SpO2; possible L shoulder RC injury ? Restrictions Weight Bearing Restrictions: No General:   Vital Signs:   Pain:0/10 Pain Assessment Pain Scale: 0-10 Pain Score: 0-No pain Mobility:        Therapy/Group:  Individual Therapy  [name] 09/24/2021, 10:16 AM

## 2021-09-24 NOTE — Progress Notes (Signed)
PROGRESS NOTE   Subjective/Complaints:   No issues overnite , has chronic left shoulder pain , had partial workup as OP, tried steroid injection which helped for ~4d, no falls or recent trauma , pain mostly with movement   ROS: Patient denies CP, SOB, N/V/D    Objective:   No results found. Recent Labs    09/24/21 0500  WBC 7.3  HGB 9.1*  HCT 30.1*  PLT 282     No results for input(s): NA, K, CL, CO2, GLUCOSE, BUN, CREATININE, CALCIUM in the last 72 hours.  Intake/Output Summary (Last 24 hours) at 09/24/2021 1028 Last data filed at 09/24/2021 0734 Gross per 24 hour  Intake 430 ml  Output 1000 ml  Net -570 ml         Physical Exam: Vital Signs Blood pressure 131/67, pulse 62, temperature 97.7 F (36.5 C), temperature source Oral, resp. rate 16, height 5\' 10"  (1.778 m), weight 72.4 kg, SpO2 95 %.    General: No acute distress Mood and affect are appropriate Heart: Regular rate and rhythm no rubs murmurs or extra sounds Lungs: Clear to auscultation, breathing unlabored, no rales or wheezes Abdomen: Positive bowel sounds, soft nontender to palpation, nondistended Extremities: No clubbing, cyanosis, or edema Skin: No evidence of breakdown, no evidence of rash  Musculoskeletal:     Cervical back: Normal range of motion. No rigidity.    Left shoulder tender with ABD and passive ER/IR but is pain is posterior along scapula  Neurological:     Comments: Intact to light touch in all 4 extremities B/L  CN's intact on exam. Good insight and awareness. Ox3- appropriate   RUE- 5-/5 in deltoid, biceps, triceps, WE, grip and FA LUE- deltoid 2-/5; weak vs pain; Biceps 4-/5; Triceps 3-/5; WE 4-/5 grip 4-/5; FA 4-/5 RLE- 4+/5 in HF, KE, KF DF and PF LLE- Proximal 3to 3+5; distal 4-/5, struggles still with sit to stand transfers Skin:    General: Skin is warm and dry.     groin and abdominal wounds all CDI        Assessment/Plan: 1. Functional deficits which require 3+ hours per day of interdisciplinary therapy in a comprehensive inpatient rehab setting. Physiatrist is providing close team supervision and 24 hour management of active medical problems listed below. Physiatrist and rehab team continue to assess barriers to discharge/monitor patient progress toward functional and medical goals  Care Tool:  Bathing    Body parts bathed by patient: Right arm, Left arm, Chest, Face   Body parts bathed by helper: Abdomen, Front perineal area, Buttocks, Right upper leg, Left upper leg, Right lower leg, Left lower leg     Bathing assist Assist Level: Moderate Assistance - Patient 50 - 74%     Upper Body Dressing/Undressing Upper body dressing   What is the patient wearing?: Pull over shirt    Upper body assist Assist Level: Maximal Assistance - Patient 25 - 49%    Lower Body Dressing/Undressing Lower body dressing      What is the patient wearing?: Pants     Lower body assist Assist for lower body dressing: Maximal Assistance - Patient 25 - 49%  Toileting Toileting    Toileting assist Assist for toileting: Total Assistance - Patient < 25% (required urinal)     Transfers Chair/bed transfer  Transfers assist  Chair/bed transfer activity did not occur: Refused  Chair/bed transfer assist level: Minimal Assistance - Patient > 75% Chair/bed transfer assistive device: Programmer, multimedia   Ambulation assist   Ambulation activity did not occur: Safety/medical concerns          Walk 10 feet activity   Assist  Walk 10 feet activity did not occur: Safety/medical concerns        Walk 50 feet activity   Assist Walk 50 feet with 2 turns activity did not occur: Safety/medical concerns         Walk 150 feet activity   Assist Walk 150 feet activity did not occur: Safety/medical concerns         Walk 10 feet on uneven surface  activity   Assist  Walk 10 feet on uneven surfaces activity did not occur: Safety/medical concerns         Wheelchair     Assist Is the patient using a wheelchair?: Yes Type of Wheelchair: Manual    Wheelchair assist level: Supervision/Verbal cueing Max wheelchair distance: 40    Wheelchair 50 feet with 2 turns activity    Assist        Assist Level: Dependent - Patient 0%   Wheelchair 150 feet activity     Assist      Assist Level: Dependent - Patient 0%   Blood pressure 131/67, pulse 62, temperature 97.7 F (36.5 C), temperature source Oral, resp. rate 16, height 5\' 10"  (1.778 m), weight 72.4 kg, SpO2 95 %.  Assessment/Plan: Functional deficits secondary  to Debility from MSSA bactermia/PE/endovascular repair of aortic leak; PE and Previous styage IIIB lung CA with new hepatic and bone metastases.  ELOS-  10/11/21 Pt may shower -Continue CIR therapies including PT, OT  2. DVT/R lieofemoral DVT- Lovenox 70 mg BID- until CT in 4 weeks by Vascular with potential switch to oral 3. Pain Management: Tylenola nd Percocet as needed for pain- for LUE/LLE and back pain .   12/12 prn percocet,  Restarted Gabapentin 300 mg QHS-   12/16 left shoulder pain. Xray reviewed and demonstrates known lytic lesions. 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 Discussed with pt/spouse today   -add lidocaine patches for low back   -will schedule one percocet daily at 0700 prior to therapy Would like to try the flector patch , hx kidney problems due to diclofenac, assume this is oral , will discussefurther with family  4. Mood:-  Palliative care following due to his cancer and ego support as required-  ?neuropsychology    5. Skin/Wound Care: encouraging nutrition  -wounds healing 6. . Fluids/Electrolytes/Nutrition: encourage PO -pt is drinking protein supps  7. MSSA  bacteremia- is on Cefazolin q8 hours for 4 weeks from 09/09/21 -thoracentesis cultures are negative. Fungal cx with gs still pending  8. Persistent Afib- is recent diagnosis- Metoprolol just increased to 75 mg BID and on Diltiazem 120 mg daily- monitor for hypotension and hold meds if BP <100.    Vitals:   09/23/21 1923 09/24/21 0451  BP: 119/60 131/67  Pulse: 92 62  Resp: 20 16  Temp: 97.7 F (36.5 C) 97.7 F (36.5  C)  SpO2: 93% 95%    9. S/p aortic leak/endovascular repair- periaortic hematoma- vascular to reorder at CT in 4 weeks to follow up 10. Previous Stage IIIB Lung CA- with new hepatic and bone mets- likely Stage IV-  -palliative care follow up appreciated -pt is DNR - F/U with Oncology and maybe hospice after d/c.  11. Constipation-    12/11- cleaned out- wife states he goes 3-4 days between BM's at home   -having some gas, will try fleet enema 12. PICC line for IV ABX- as of 09/17/21 13. LE Edema--continue KHT's, elevation of limbs, improve nutrition     -asking nursing again to apply HS ACE wraps            LOS: 7 days A FACE TO FACE EVALUATION WAS PERFORMED  Charlett Blake 09/24/2021, 10:28 AM

## 2021-09-25 NOTE — Progress Notes (Signed)
Occupational Therapy Session Note  Patient Details  Name: Leon Lyons MRN: [id number] Date of Birth: 1933/08/17  Today's Date: 09/26/2021 OT Individual Time: 1104-1200 OT Individual Time Calculation (min): 56 min   Short Term Goals: Week 1:  OT Short Term Goal 1 (Week 1): Pt will perform sit to stand with mod A 50% of time without the STEDY for clothing mangement OT Short Term Goal 1 - Progress (Week 1): Met OT Short Term Goal 2 (Week 1): Pt will be able to transfer with mod A 50% of the time to the toilet/ BSC OT Short Term Goal 2 - Progress (Week 1): Met OT Short Term Goal 3 (Week 1): PT will be able to thread LB clothing with min A with AE prn OT Short Term Goal 3 - Progress (Week 1): Progressing toward goal  Skilled Therapeutic Interventions/Progress Updates:    Pt greeted in the w/c with c/o 8/10 Lt arm pain from swelling, also Lt LE pain. Pt premedicated however asking for seated level OT today due to pain. Therefore guided pt through LE exercises in beat to Christmas music. Wife participating also. Reviewed extensively diaphragmatic breathing technique as pt visibly breathes using accessory muscles. He required multiple rest breaks due to limited activity tolerance. Afterwards attempted to stand x4 using the RW however pt unable to achieve full stand with Max A of 1. Mod A for squat pivot<bed. He returned to supine and was left with all needs within reach and wife present.   Therapy Documentation Precautions:  Precautions Precautions: Fall, Other (comment) Precaution Comments: monitor BP, HR, and SpO2; possible L shoulder RC injury ? Restrictions Weight Bearing Restrictions: No  ADL: ADL Eating: Independent Grooming: Setup Where Assessed-Grooming: Sitting at sink Upper Body Bathing: Moderate assistance Where Assessed-Upper Body Bathing: Edge of bed Lower Body Bathing: Maximal assistance Where Assessed-Lower Body Bathing: Wheelchair Upper Body Dressing: Moderate  assistance Where Assessed-Upper Body Dressing: Wheelchair Lower Body Dressing: Other (Comment) (Total A) Toileting: Dependent Toilet Transfer Method: Squat pivot Tub/Shower Transfer: Maximal assistance  Therapy/Group: Individual Therapy  Rehmat Murtagh A Amahri Dengel 09/26/2021, 12:56 PM

## 2021-09-25 NOTE — Progress Notes (Signed)
Physical Therapy Session Note  Patient Details  Name: Leon Lyons MRN: [id number] Date of Birth: Jun 03, 1933  Today's Date: 09/25/2021 PT Individual Time: 1101-1158 PT Individual Time Calculation (min): 57 min   Short Term Goals: Week 1:  PT Short Term Goal 1 (Week 1): Pt will perform supine<>sit with min assist PT Short Term Goal 2 (Week 1): Pt will perform sit<>stands using LRAD with min assist PT Short Term Goal 3 (Week 1): Pt will perform bed<>chair transfers using LRAD with min assist PT Short Term Goal 4 (Week 1): Pt will ambulate at least 62ft using LRAD with mod assist and +2 w/c follow if needed  Skilled Therapeutic Interventions/Progress Updates:    Total assist for transport to/from gym for energy conservation. Neuro re-ed for blocked practice sit <> stands with focus on power up and functional LE strengthening as well as controlling descent during stand > sit with RW. Requires overall mod assist for power up and facilitation of anterior weightshift. Pt able to perform stand step with RW with min assist and small steps to the mat table and back to w/c. As fatigues, requires up to max assist for sit > stand. Educated on postural exercises during rest breaks, use of breath on exhale for support, and pelvic tilt exercises (slump > sit up tall with activation through abdominals and trunk extension x 10-15 reps each). Cues for use of exhale on exertion during sit > stands. Introduced gait with cues needed for upright posture, placement and moving RW forward, and hip extension x 5' x 2 trials with seated rest break. Pt able to demonstrate improved step length on second trial and better control of RW. PT required overall min assist during gait with close w/c follow for safety. Pt noted to have incontinent BM during gait. Returned to room and utilized stedy to transfer from w/c to [facility] and notified RN of pt on toilet. Wife with patient as well.    Therapy Documentation Precautions:   Precautions Precautions: Fall, Other (comment) Precaution Comments: monitor BP, HR, and SpO2; possible L shoulder RC injury ? Restrictions Weight Bearing Restrictions: No  Pain: Reports pain in L shoulder on going, premedicated.     Therapy/Group: Individual Therapy  Canary Brim Ivory Broad, PT, DPT, CBIS  09/25/2021, 12:02 PM

## 2021-09-25 NOTE — Progress Notes (Signed)
PROGRESS NOTE   Subjective/Complaints:  Some DOE in therapy noted by staff   ROS: Patient denies CP, SOB, N/V/D    Objective:   DG CHEST PORT 1 VIEW  Result Date: 09/24/2021 CLINICAL DATA:  Shortness of breath, history of lung cancer on the left, initial encounter EXAM: PORTABLE CHEST 1 VIEW COMPARISON:  09/17/2021 FINDINGS: Persistent left-sided pleural effusion is noted as well as parenchymal density in the residual left upper lobe stable in appearance from the prior exam. Hyperinflation of right lung is seen. Increasing right basilar infiltrate is seen. Right-sided PICC line is noted in the mid right atrium. No bony abnormality is seen. IMPRESSION: Stable left-sided pleural effusion as well as parenchymal density similar to that seen on prior plain film. Increasing right basilar infiltrate is seen. Electronically Signed   By: Inez Catalina M.D.   On: 09/24/2021 19:54   Recent Labs    09/24/21 0500  WBC 7.3  HGB 9.1*  HCT 30.1*  PLT 282     No results for input(s): NA, K, CL, CO2, GLUCOSE, BUN, CREATININE, CALCIUM in the last 72 hours.  Intake/Output Summary (Last 24 hours) at 09/25/2021 0929 Last data filed at 09/25/2021 0806 Gross per 24 hour  Intake 480 ml  Output 2050 ml  Net -1570 ml         Physical Exam: Vital Signs Blood pressure (!) 143/86, pulse 88, temperature 97.8 F (36.6 C), resp. rate 16, height 5\' 10"  (1.778 m), weight 74 kg, SpO2 91 %.    General: No acute distress Mood and affect are appropriate Heart: Regular rate and rhythm no rubs murmurs or extra sounds Lungs: Clear to auscultation, breathing unlabored, no rales or wheezes Abdomen: Positive bowel sounds, soft nontender to palpation, nondistended Extremities: No clubbing, cyanosis, or edema Skin: No evidence of breakdown, no evidence of rash  Musculoskeletal:     Cervical back: Normal range of motion. No rigidity.    Left shoulder  tender with ABD and passive ER/IR but is pain is posterior along scapula  Neurological:     Comments: Intact to light touch in all 4 extremities B/L  CN's intact on exam. Good insight and awareness. Ox3- appropriate   RUE- 5-/5 in deltoid, biceps, triceps, WE, grip and FA LUE- deltoid 2-/5; weak vs pain; Biceps 4-/5; Triceps 3-/5; WE 4-/5 grip 4-/5; FA 4-/5 RLE- 4+/5 in HF, KE, KF DF and PF LLE- Proximal 3to 3+5; distal 4-/5, struggles still with sit to stand transfers Skin:    General: Skin is warm and dry.     groin and abdominal wounds all CDI       Assessment/Plan: 1. Functional deficits which require 3+ hours per day of interdisciplinary therapy in a comprehensive inpatient rehab setting. Physiatrist is providing close team supervision and 24 hour management of active medical problems listed below. Physiatrist and rehab team continue to assess barriers to discharge/monitor patient progress toward functional and medical goals  Care Tool:  Bathing    Body parts bathed by patient: Right arm, Left arm, Chest, Face   Body parts bathed by helper: Abdomen, Front perineal area, Buttocks, Right upper leg, Left upper leg, Right lower leg, Left  lower leg     Bathing assist Assist Level: Moderate Assistance - Patient 50 - 74%     Upper Body Dressing/Undressing Upper body dressing   What is the patient wearing?: Pull over shirt    Upper body assist Assist Level: Maximal Assistance - Patient 25 - 49%    Lower Body Dressing/Undressing Lower body dressing      What is the patient wearing?: Pants     Lower body assist Assist for lower body dressing: Maximal Assistance - Patient 25 - 49%     Toileting Toileting    Toileting assist Assist for toileting: Total Assistance - Patient < 25% (required urinal)     Transfers Chair/bed transfer  Transfers assist  Chair/bed transfer activity did not occur: Refused  Chair/bed transfer assist level: Minimal Assistance - Patient >  75% Chair/bed transfer assistive device: Programmer, multimedia   Ambulation assist   Ambulation activity did not occur: Safety/medical concerns          Walk 10 feet activity   Assist  Walk 10 feet activity did not occur: Safety/medical concerns        Walk 50 feet activity   Assist Walk 50 feet with 2 turns activity did not occur: Safety/medical concerns         Walk 150 feet activity   Assist Walk 150 feet activity did not occur: Safety/medical concerns         Walk 10 feet on uneven surface  activity   Assist Walk 10 feet on uneven surfaces activity did not occur: Safety/medical concerns         Wheelchair     Assist Is the patient using a wheelchair?: Yes Type of Wheelchair: Manual    Wheelchair assist level: Supervision/Verbal cueing Max wheelchair distance: 40    Wheelchair 50 feet with 2 turns activity    Assist        Assist Level: Dependent - Patient 0%   Wheelchair 150 feet activity     Assist      Assist Level: Dependent - Patient 0%   Blood pressure (!) 143/86, pulse 88, temperature 97.8 F (36.6 C), resp. rate 16, height 5\' 10"  (1.778 m), weight 74 kg, SpO2 91 %.  Assessment/Plan: Functional deficits secondary  to Debility from MSSA bactermia/PE/endovascular repair of aortic leak; PE and Previous styage IIIB lung CA with new hepatic and bone metastases.  ELOS-  10/11/21 Pt may shower -Continue CIR therapies including PT, OT  2. DVT/R lieofemoral DVT- Lovenox 70 mg BID- until CT in 4 weeks by Vascular with potential switch to oral 3. Pain Management: Tylenola nd Percocet as needed for pain- for LUE/LLE and back pain .   12/12 prn percocet,  Restarted Gabapentin 300 mg QHS-   12/16 left shoulder pain. Xray reviewed and demonstrates known lytic lesions. He's had chronic RTC problems and exam has been c/w this   -continue voltaren gel   -consider steroid injection but suspect a lot of pain is related  to  bony mets in scapula. Discussed with pt/spouse today   -add lidocaine patches for low back   -will schedule one percocet daily at 0700 prior to therapy Would like to try the flector patch , hx kidney problems due to diclofenac, assume this is oral , will discussefurther with family  4. Mood:-  Palliative care following due to his cancer and ego support as required-  ?neuropsychology    5. Skin/Wound Care: encouraging nutrition  -wounds healing 6. Marland Kitchen  Fluids/Electrolytes/Nutrition: encourage PO -pt is drinking protein supps  7. MSSA bacteremia- is on Cefazolin q8 hours for 4 weeks from 09/09/21 -thoracentesis cultures are negative. Fungal cx with gs still pending  8. Persistent Afib- is recent diagnosis- Metoprolol just increased to 75 mg BID and on Diltiazem 120 mg daily- monitor for hypotension and hold meds if BP <100.    Vitals:   09/24/21 2004 09/25/21 0451  BP: 137/67 (!) 143/86  Pulse: 99 88  Resp: 15 16  Temp: 98 F (36.7 C) 97.8 F (36.6 C)  SpO2: 95% 91%     9. S/p aortic leak/endovascular repair- periaortic hematoma- vascular to reorder at CT in 4 weeks to follow up 10. Previous Stage IIIB Lung CA- with new hepatic and bone mets- likely Stage IV-  -palliative care follow up appreciated -pt is DNR - F/U with Oncology and maybe hospice after d/c.  11. Constipation-    12/11- cleaned out- wife states he goes 3-4 days between BM's at home   -having some gas, will try fleet enema 12. PICC line for IV ABX- as of 09/17/21 13. LE Edema--continue KHT's, elevation of limbs, improve nutrition     -asking nursing again to apply HS ACE wraps          14.  DOE CXR without sig changes, don't really appreciate difference on xray compared to prior , afebrile , WBC normal , on IV ABx   LOS: 8 days A FACE TO FACE EVALUATION WAS PERFORMED  Charlett Blake 09/25/2021, 9:29 AM

## 2021-09-26 LAB — BASIC METABOLIC PANEL
Anion gap: 3 — ABNORMAL LOW (ref 5–15)
BUN: 10 mg/dL (ref 8–23)
CO2: 34 mmol/L — ABNORMAL HIGH (ref 22–32)
Calcium: 8.7 mg/dL — ABNORMAL LOW (ref 8.9–10.3)
Chloride: 99 mmol/L (ref 98–111)
Creatinine, Ser: 0.55 mg/dL — ABNORMAL LOW (ref 0.61–1.24)
GFR, Estimated: >60 mL/min (ref >60)
Glucose, Bld: 87 mg/dL (ref 70–99)
Potassium: 3.5 mmol/L (ref 3.5–5.1)
Sodium: 136 mmol/L (ref 135–145)

## 2021-09-26 MED ORDER — ENOXAPARIN SODIUM 80 MG/0.8ML IJ SOSY
75.0000 mg | PREFILLED_SYRINGE | Freq: Two times a day (BID) | INTRAMUSCULAR | Status: DC
  Administered 2021-09-26 – 2021-10-04 (×16): 75 mg via SUBCUTANEOUS
  Filled 2021-09-26 (×16): qty 0.8

## 2021-09-26 NOTE — Progress Notes (Signed)
Physical Therapy Session Note  Patient Details  Name: Leon Lyons MRN: [id number] Date of Birth: 1932/11/21  Today's Date: 09/26/2021 PT Individual Time: 3244-0102 PT Individual Time Calculation (min): 40 min   Short Term Goals: Week 1:  PT Short Term Goal 1 (Week 1): Pt will perform supine<>sit with min assist PT Short Term Goal 2 (Week 1): Pt will perform sit<>stands using LRAD with min assist PT Short Term Goal 3 (Week 1): Pt will perform bed<>chair transfers using LRAD with min assist PT Short Term Goal 4 (Week 1): Pt will ambulate at least 60ft using LRAD with mod assist and +2 w/c follow if needed  Skilled Therapeutic Interventions/Progress Updates:     Pt received seated in [facility] and agrees to therapy. No complaint of pain. WC transport to gym for time management. Vitals assessed prior to mobility: HR 91 and O2 96%. Pt performs sit to stand in parallel bars with minA/modA and remains standing 1-2 minutes, including marching in place. PT provides CGA/minA for posture and stability. Vitals assessed immediately following standing bout and pt's Hr 103 and O2 95%. Pt performs additional stand, this time ambulating forward 5' and backward 5', x3, for total of 30', with minA and use of parallel bars. PT provides cues for pursed lip breathing to optimize oxygen sats, and upright gaze to improve posture and balance. During rest break vitals monitored with O2 92%. Pt performs multiple reps of sit to stand with emphasis on anterior weight transition and body mechanics. Pt typically requires modA to complete with slightly less assistance required when weight shift is adequate. Pt also practices increased eccentric control while transitioning from stand to sitting. WC transport back to room. Pt left seated with alarm intact and all needs within reach.  Therapy Documentation Precautions:  Precautions Precautions: Fall, Other (comment) Precaution Comments: monitor BP, HR, and SpO2; possible L  shoulder RC injury ? Restrictions Weight Bearing Restrictions: No   Therapy/Group: Individual Therapy  Breck Coons, PT, DPT 09/26/2021, 4:08 PM

## 2021-09-26 NOTE — Progress Notes (Signed)
PROGRESS NOTE   Subjective/Complaints:  Overall feeling ok. "Legs are still weak". Has gotten ACE wraps at night which have helped edema in legs  ROS: Patient denies fever, rash, sore throat, blurred vision, nausea, vomiting, diarrhea, cough, shortness of breath or chest pain, joint or back pain, headache, or mood change.    Objective:   DG CHEST PORT 1 VIEW  Result Date: 09/24/2021 CLINICAL DATA:  Shortness of breath, history of lung cancer on the left, initial encounter EXAM: PORTABLE CHEST 1 VIEW COMPARISON:  09/17/2021 FINDINGS: Persistent left-sided pleural effusion is noted as well as parenchymal density in the residual left upper lobe stable in appearance from the prior exam. Hyperinflation of right lung is seen. Increasing right basilar infiltrate is seen. Right-sided PICC line is noted in the mid right atrium. No bony abnormality is seen. IMPRESSION: Stable left-sided pleural effusion as well as parenchymal density similar to that seen on prior plain film. Increasing right basilar infiltrate is seen. Electronically Signed   By: Inez Catalina M.D.   On: 09/24/2021 19:54   Recent Labs    09/24/21 0500  WBC 7.3  HGB 9.1*  HCT 30.1*  PLT 282    Recent Labs    09/26/21 0318  NA 136  K 3.5  CL 99  CO2 34*  GLUCOSE 87  BUN 10  CREATININE 0.55*  CALCIUM 8.7*    Intake/Output Summary (Last 24 hours) at 09/26/2021 1202 Last data filed at 09/26/2021 0910 Gross per 24 hour  Intake 720 ml  Output 2750 ml  Net -2030 ml        Physical Exam: Vital Signs Blood pressure 129/83, pulse 99, temperature 98.4 F (36.9 C), temperature source Oral, resp. rate 16, height 5\' 10"  (1.778 m), weight 74 kg, SpO2 94 %.    Constitutional: No distress . Vital signs reviewed. HEENT: NCAT, EOMI, oral membranes moist Neck: supple Cardiovascular: IRR IRR. No JVD    Respiratory/Chest: crackles at bases.. Normal effort     GI/Abdomen: BS +, non-tender, non-distended Ext: no clubbing, cyanosis, or edema Psych: pleasant and cooperative  Musculoskeletal:     Cervical back: Normal range of motion. No rigidity.    Left shoulder tender with ABD and passive ER/IR but is pain is posterior along scapula  Neurological:     Comments: Intact to light touch in all 4 extremities B/L  CN's intact on exam. Good insight and awareness. Ox3- appropriate   RUE- 5-/5 in deltoid, biceps, triceps, WE, grip and FA LUE- deltoid 2-/5; weak vs pain; Biceps 4-/5; Triceps 3-/5; WE 4-/5 grip 4-/5; FA 4-/5 RLE- 4+/5 in HF, KE, KF DF and PF LLE- Proximal 3to 3+5; distal 4-/5, struggles still with sit to stand transfers Skin:    General: Skin is warm and dry.     groin and abdominal wounds remain CDI       Assessment/Plan: 1. Functional deficits which require 3+ hours per day of interdisciplinary therapy in a comprehensive inpatient rehab setting. Physiatrist is providing close team supervision and 24 hour management of active medical problems listed below. Physiatrist and rehab team continue to assess barriers to discharge/monitor patient progress toward functional and medical goals  Care Tool:  Bathing    Body parts bathed by patient: Right arm, Left arm, Chest, Face   Body parts bathed by helper: Abdomen, Front perineal area, Buttocks, Right upper leg, Left upper leg, Right lower leg, Left lower leg     Bathing assist Assist Level: Moderate Assistance - Patient 50 - 74%     Upper Body Dressing/Undressing Upper body dressing   What is the patient wearing?: Pull over shirt    Upper body assist Assist Level: Maximal Assistance - Patient 25 - 49%    Lower Body Dressing/Undressing Lower body dressing      What is the patient wearing?: Pants     Lower body assist Assist for lower body dressing: Maximal Assistance - Patient 25 - 49%     Toileting Toileting    Toileting assist Assist for toileting: Total Assistance  - Patient < 25% (required urinal)     Transfers Chair/bed transfer  Transfers assist  Chair/bed transfer activity did not occur: Refused  Chair/bed transfer assist level: Minimal Assistance - Patient > 75% Chair/bed transfer assistive device: Programmer, multimedia   Ambulation assist   Ambulation activity did not occur: Safety/medical concerns  Assist level: 2 helpers Assistive device: Walker-rolling Max distance: 5'   Walk 10 feet activity   Assist  Walk 10 feet activity did not occur: Safety/medical concerns  Assist level: 2 helpers (min assist of 1 with w/c follow) Assistive device: Walker-rolling   Walk 50 feet activity   Assist Walk 50 feet with 2 turns activity did not occur: Safety/medical concerns         Walk 150 feet activity   Assist Walk 150 feet activity did not occur: Safety/medical concerns         Walk 10 feet on uneven surface  activity   Assist Walk 10 feet on uneven surfaces activity did not occur: Safety/medical concerns         Wheelchair     Assist Is the patient using a wheelchair?: Yes Type of Wheelchair: Manual    Wheelchair assist level: Supervision/Verbal cueing Max wheelchair distance: 40    Wheelchair 50 feet with 2 turns activity    Assist        Assist Level: Dependent - Patient 0%   Wheelchair 150 feet activity     Assist      Assist Level: Dependent - Patient 0%   Blood pressure 129/83, pulse 99, temperature 98.4 F (36.9 C), temperature source Oral, resp. rate 16, height 5\' 10"  (1.778 m), weight 74 kg, SpO2 94 %.  Assessment/Plan: Functional deficits secondary  to Debility from MSSA bactermia/PE/endovascular repair of aortic leak; PE and Previous styage IIIB lung CA with new hepatic and bone metastases.  ELOS-  10/11/21 Pt may shower -Continue CIR therapies including PT, OT   2. DVT/R lieofemoral DVT- Lovenox 70 mg BID- until CT in 4 weeks by Vascular with potential switch  to oral 3. Pain Management: Tylenola nd Percocet as needed for pain- for LUE/LLE and back pain .   12/12 prn percocet,  Restarted Gabapentin 300 mg QHS-   12/16 left shoulder pain. Xray reviewed and demonstrates known lytic lesions. 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     -continue lidocaine patches for low back   -scheduled one percocet daily at 0700 prior  to therapy     4. Mood:-  Palliative care following due to his cancer and ego support as required-  ?neuropsychology    5. Skin/Wound Care: encouraging nutrition  -wounds healing 6. . Fluids/Electrolytes/Nutrition: encourage PO -pt is drinking protein supps  7. MSSA bacteremia- is on Cefazolin q8 hours for 4 weeks from 09/09/21 -thoracentesis cultures are negative. Fungal cx with gs still pending  8. Persistent Afib- is recent diagnosis- Metoprolol just increased to 75 mg BID and on Diltiazem 120 mg daily- monitor for hypotension and hold meds if BP <100.    Vitals:   09/26/21 0529 09/26/21 0903  BP: 137/66 129/83  Pulse: (!) 103 99  Resp: 16   Temp: 98.4 F (36.9 C)   SpO2: 94%     12/19 HR stable 9. S/p aortic leak/endovascular repair- periaortic hematoma- vascular to reorder at CT in 4 weeks to follow up 10. Previous Stage IIIB Lung CA- with new hepatic and bone mets- likely Stage IV-  -palliative care follow up appreciated -pt is DNR - F/U with Oncology and maybe hospice after d/c.  -DOE at times, per baseline, CXR reviewed without much change 11. Constipation-    12/19 had large bm 12/18 12. PICC line for IV ABX- as of 09/17/21 13. LE Edema--continue KHT's, elevation of limbs, improve nutrition     -asking nursing again to apply HS ACE wraps             LOS: 9 days A FACE TO FACE EVALUATION WAS PERFORMED  Meredith Staggers 09/26/2021, 12:02 PM

## 2021-09-26 NOTE — Progress Notes (Signed)
Occupational Therapy Weekly Progress Note  Patient Details  Name: PARMINDER CUPPLES MRN: [id number] Date of Birth: [birth year]  Beginning of progress report period: September 18, 2021 End of progress report period: September 26, 2021  Today's Date: 09/26/2021 OT Individual Time: 5009-3818 OT Individual Time Calculation (min): 71 min   Patient has met 2 of 3 short term goals.  Pt is making slow, but steady progress towards OT goals. Pt has been limited at times due to pain in L shoulder and back pain, as well as low endurance. Pt has progressed to standing with mod/max A with a RW, and then he is able to pivot to wc or BSC with min/mod A of 1 person. Pt is tolerating standing bouts of about 30-45 seconds within functional tasks. Continue current POC.  Patient continues to demonstrate the following deficits: muscle weakness, decreased cardiorespiratoy endurance, and decreased sitting balance, decreased standing balance, decreased postural control, and decreased balance strategies and therefore will continue to benefit from skilled OT intervention to enhance overall performance with BADL and Reduce care partner burden.  Patient progressing toward long term goals..  Continue plan of care.  OT Short Term Goals Week 1:  OT Short Term Goal 1 (Week 1): Pt will perform sit to stand with mod A 50% of time without the STEDY for clothing mangement OT Short Term Goal 1 - Progress (Week 1): Met OT Short Term Goal 2 (Week 1): Pt will be able to transfer with mod A 50% of the time to the toilet/ BSC OT Short Term Goal 2 - Progress (Week 1): Met OT Short Term Goal 3 (Week 1): PT will be able to thread LB clothing with min A with AE prn OT Short Term Goal 3 - Progress (Week 1): Progressing toward goal Week 2:  OT Short Term Goal 1 (Week 2): PT will be able to thread LB clothing with min A with AE prn OT Short Term Goal 2 (Week 2): Pt will consistently stand with mod A at the sink in preparation for BADL  tasks OT Short Term Goal 3 (Week 2): Patient will demonstrate R elbow ROM for edema management with min cues OT Short Term Goal 4 (Week 2): Patient will complete 1 step of toileting task  Skilled Therapeutic Interventions/Progress Updates:    Pt greeted semi-reclined in bed with spouse present. Pt completed bed mobility with increased time and mod A to elevate trunk. Max A SIT<>STAND with RW and verbal cues for hand placement. Once in standing, pt needed facilitation for anterior weight shift, then was able to take small steps to pivot to wc. UB bathing at the sink with min set-up A. Worked on fine motor control to button shirt with pt having increased difficulty and needing more time to complete. Sit<>stand at the sink with mod A, then pt able to wash buttocks and peri-area in standing with mod A for balance. Pt reporting pain in L LE and back. Pt needed extended rest break after standing with very poor endurance. OT assist to thread pant legs, then pt stood again with mod A to pull up pants. OT encouraged pt to stay standing to brush teeth, but was unable to tolerate. Min A to brush teeth sitting at the sink. Edema noted in L elbow. OT placed kinesiotpae for edema management and eucated on L arm positioning as well as there-ex. Pt completed elbow flex/ext, forearm pronation/supination, hand flex/ext w\uith foam. Pt left seated in wc with alarm belt on, wife present, and  needs met.  Therapy Documentation Precautions:  Precautions Precautions: Fall, Other (comment) Precaution Comments: monitor BP, HR, and SpO2; possible L shoulder RC injury ? Restrictions Weight Bearing Restrictions: No Pain: Pain Assessment Pain Scale: 0-10 Pain Score: 5  Pain Location: Back Pain Orientation: Lower Pain Descriptors / Indicators: Aching Pain Frequency: Intermittent Pain Onset: On-going Patients Stated Pain Goal: 0 Pain Intervention(s): Repositioned  Therapy/Group: Individual Therapy  Valma Cava 09/26/2021, 10:01 AM

## 2021-09-26 NOTE — Progress Notes (Signed)
ANTICOAGULATION CONSULT NOTE - Follow Up Consult  Pharmacy Consult for Lovenox Indication:  Recent DVT, PE and afib with metastatic cancer  Allergies  Allergen Reactions   Diclofenac Sodium Other (See Comments)    Kidneys were affected, was on Diclofenac 75mg  po BID (not topical) for Left shoulder pain,    Patient Measurements: Height: 5\' 10"  (177.8 cm) Weight: 74 kg (163 lb 2.3 oz) IBW/kg (Calculated) : 73  Vital Signs: Temp: 98.4 F (36.9 C) (12/19 0529) Temp Source: Oral (12/19 0529) BP: 129/83 (12/19 0903) Pulse Rate: 99 (12/19 0903)  Labs: Recent Labs    09/24/21 0500 09/26/21 0318  HGB 9.1*  --   HCT 30.1*  --   PLT 282  --   CREATININE  --  0.55*     Estimated Creatinine Clearance: 68.4 mL/min (A) (by C-G formula based on SCr of 0.55 mg/dL (L)).   Assessment:  On Lovenox PTA for hx DVT and PE and afib. Wt 74kg. We will adjust dose slightly.   Scr<1 Hgb ~9s Plt wnl   Goal of Therapy:  Anti-Xa level 0.6-1 units/ml 4hrs after LMWH dose given Monitor platelets by anticoagulation protocol: Yes   Plan:  Decrease Lovenox to 75mg  BID q12 hrs for adequate treatment of recent VTE and afib. CBC q72h per LMWH protocol   Onnie Boer, PharmD, BCIDP, AAHIVP, CPP Infectious Disease Pharmacist 09/26/2021 10:30 AM

## 2021-09-27 LAB — CBC
HCT: 30.3 % — ABNORMAL LOW (ref 39.0–52.0)
Hemoglobin: 9.4 g/dL — ABNORMAL LOW (ref 13.0–17.0)
MCH: 30.5 pg (ref 26.0–34.0)
MCHC: 31 g/dL (ref 30.0–36.0)
MCV: 98.4 fL (ref 80.0–100.0)
Platelets: 322 10*3/uL (ref 150–400)
RBC: 3.08 MIL/uL — ABNORMAL LOW (ref 4.22–5.81)
RDW: 17.2 % — ABNORMAL HIGH (ref 11.5–15.5)
WBC: 6 10*3/uL (ref 4.0–10.5)
nRBC: 0 % (ref 0.0–0.2)

## 2021-09-27 NOTE — Progress Notes (Signed)
Occupational Therapy Session Note  Patient Details  Name: Leon Lyons MRN: [id number] Date of Birth: [birth year]  Today's Date: 09/27/2021 OT Individual Time: 1300-1345 OT Individual Time Calculation (min): 45 min    Short Term Goals: Week 2:  OT Short Term Goal 1 (Week 2): PT will be able to thread LB clothing with min A with AE prn OT Short Term Goal 2 (Week 2): Pt will consistently stand with mod A at the sink in preparation for BADL tasks OT Short Term Goal 3 (Week 2): Patient will demonstrate R elbow ROM for edema management with min cues OT Short Term Goal 4 (Week 2): Patient will complete 1 step of toileting task  Skilled Therapeutic Interventions/Progress Updates:    Pt received sitting in w/c with wife present, reporting he had a BM and needs assist to pull up pants. He stood with mod A to pull up pants with mod A. He was taken via w/c to the therapy gym. He completed the BUE ergometer with min facilitation at the L scapula to provide proximal stability at the medial border (winging present). No pain reported during use. He completed 3 trials of 2-3 min with an extended rest break between. Pt completed a stand pivot transfer to the mat with mod A using the RW. He completed blocked practice NDT based activity with focus on forward weight shift to reduce posterior bias during sit <> stands. He required mod cueing for body mechanics and min A overall to increase confidence and pelvic rotation. Pt returned to the w/c and was brought back to his room. He was left sitting up with his wife present, all needs met.   Therapy Documentation Precautions:  Precautions Precautions: Fall, Other (comment) Precaution Comments: monitor BP, HR, and SpO2; possible L shoulder RC injury ? Restrictions Weight Bearing Restrictions: No   Therapy/Group: Individual Therapy  Curtis Sites 09/27/2021, 6:29 AM

## 2021-09-27 NOTE — Progress Notes (Signed)
PROGRESS NOTE   Subjective/Complaints:  Experiencing back pain as well as generalized pain. PT asked for kpad today. Lidocaine patches help too. Occasional cough, esp in morning. Legs still weak  ROS: Patient denies fever, rash, sore throat, blurred vision, nausea, vomiting, diarrhea, cough, shortness of breath or chest pain, joint or back pain, headache, or mood change.     Objective:   No results found. Recent Labs    09/27/21 0836  WBC 6.0  HGB 9.4*  HCT 30.3*  PLT 322    Recent Labs    09/26/21 0318  NA 136  K 3.5  CL 99  CO2 34*  GLUCOSE 87  BUN 10  CREATININE 0.55*  CALCIUM 8.7*    Intake/Output Summary (Last 24 hours) at 09/27/2021 1135 Last data filed at 09/27/2021 0800 Gross per 24 hour  Intake 600 ml  Output 1850 ml  Net -1250 ml        Physical Exam: Vital Signs Blood pressure 135/68, pulse 64, temperature 97.6 F (36.4 C), temperature source Oral, resp. rate 18, height 5\' 10"  (1.778 m), weight 74 kg, SpO2 96 %.    Constitutional: No distress . Vital signs reviewed. HEENT: NCAT, EOMI, oral membranes moist Neck: supple Cardiovascular: IRR IRR. No JVD    Respiratory/Chest: scattered rhonchi, crackles at bases    GI/Abdomen: BS +, non-tender, non-distended Ext: no clubbing, cyanosis Psych: pleasant and cooperative. Lower ext edema better with ACE, elbow edema also today on left, mild  Musculoskeletal:     Cervical back: Normal range of motion. No rigidity.    left scapular tenderness Neurological:     Comments: Intact to light touch in all 4 extremities B/L  CN's intact on exam. Good insight and awareness. Ox3- appropriate   RUE- 5-/5 in deltoid, biceps, triceps, WE, grip and FA LUE- deltoid 2-/5; weak vs pain; Biceps 4-/5; Triceps 3-/5; WE 4-/5 grip 4-/5; FA 4-/5 RLE- 4+/5 in HF, KE, KF DF and PF LLE- Proximal 3to 3+5; distal 4-/5--stable Skin:    General: Skin is warm and dry.      groin and abdominal wounds remain CDI       Assessment/Plan: 1. Functional deficits which require 3+ hours per day of interdisciplinary therapy in a comprehensive inpatient rehab setting. Physiatrist is providing close team supervision and 24 hour management of active medical problems listed below. Physiatrist and rehab team continue to assess barriers to discharge/monitor patient progress toward functional and medical goals  Care Tool:  Bathing    Body parts bathed by patient: Right arm, Left arm, Chest, Face   Body parts bathed by helper: Abdomen, Front perineal area, Buttocks, Right upper leg, Left upper leg, Right lower leg, Left lower leg     Bathing assist Assist Level: Moderate Assistance - Patient 50 - 74%     Upper Body Dressing/Undressing Upper body dressing   What is the patient wearing?: Pull over shirt    Upper body assist Assist Level: Maximal Assistance - Patient 25 - 49%    Lower Body Dressing/Undressing Lower body dressing      What is the patient wearing?: Pants     Lower body assist Assist for lower  body dressing: Maximal Assistance - Patient 25 - 49%     Toileting Toileting    Toileting assist Assist for toileting: Total Assistance - Patient < 25% (required urinal)     Transfers Chair/bed transfer  Transfers assist  Chair/bed transfer activity did not occur: Refused  Chair/bed transfer assist level: Minimal Assistance - Patient > 75% Chair/bed transfer assistive device: Programmer, multimedia   Ambulation assist   Ambulation activity did not occur: Safety/medical concerns  Assist level: 2 helpers Assistive device: Walker-rolling Max distance: 5'   Walk 10 feet activity   Assist  Walk 10 feet activity did not occur: Safety/medical concerns  Assist level: 2 helpers (min assist of 1 with w/c follow) Assistive device: Walker-rolling   Walk 50 feet activity   Assist Walk 50 feet with 2 turns activity did not occur:  Safety/medical concerns         Walk 150 feet activity   Assist Walk 150 feet activity did not occur: Safety/medical concerns         Walk 10 feet on uneven surface  activity   Assist Walk 10 feet on uneven surfaces activity did not occur: Safety/medical concerns         Wheelchair     Assist Is the patient using a wheelchair?: Yes Type of Wheelchair: Manual    Wheelchair assist level: Supervision/Verbal cueing Max wheelchair distance: 40    Wheelchair 50 feet with 2 turns activity    Assist        Assist Level: Dependent - Patient 0%   Wheelchair 150 feet activity     Assist      Assist Level: Dependent - Patient 0%   Blood pressure 135/68, pulse 64, temperature 97.6 F (36.4 C), temperature source Oral, resp. rate 18, height 5\' 10"  (1.778 m), weight 74 kg, SpO2 96 %.  Assessment/Plan: Functional deficits secondary  to Debility from MSSA bactermia/PE/endovascular repair of aortic leak; PE and Previous styage IIIB lung CA with new hepatic and bone metastases.  ELOS-  10/11/21 Pt may shower -Continue CIR therapies including PT, OT. Interdisciplinary team conference today to discuss goals, barriers to discharge, and dc planning.     2. DVT/R lieofemoral DVT- Lovenox 70 mg BID- until CT in 4 weeks by Vascular with potential switch to oral 3. Pain Management: Tylenola nd Percocet as needed for pain- for LUE/LLE and back pain .   12/12 prn percocet,  Restarted Gabapentin 300 mg QHS-   12/20 left shoulder pain.   lytic lesions. 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     -continue lidocaine patches for low back   -scheduled one percocet daily at 0700 prior to therapy    -added kpad today 4. Mood:-  Palliative care following due to his cancer and ego support as required-  ?neuropsychology    5. Skin/Wound Care: encouraging  nutrition  -wounds healing 6. . Fluids/Electrolytes/Nutrition: encourage PO -pt is drinking protein supps  7. MSSA bacteremia- is on Cefazolin q8 hours for 4 weeks from 09/09/21 -thoracentesis cultures are negative. Fungal cx with gs still pending  8. Persistent Afib- is recent diagnosis- Metoprolol just increased to 75 mg BID and on Diltiazem 120 mg daily- monitor for hypotension and hold meds if BP <100.    Vitals:   09/27/21 0738 09/27/21 0952  BP: 135/68  Pulse: (!) 116 64  Resp:    Temp:    SpO2:  96%    12/20 HR inconsistent 9. S/p aortic leak/endovascular repair- periaortic hematoma- vascular to reorder at CT in 4 weeks to follow up 10. Previous Stage IIIB Lung CA- with new hepatic and bone mets- likely Stage IV-  -palliative care follow up appreciated -pt is DNR - F/U with Oncology and maybe hospice after d/c.  -DOE at times, per baseline, CXR reviewed without much change 11. Constipation-    12/19 had large bm 12/18 12. PICC line for IV ABX- as of 09/17/21 13. LE Edema--continue KHT's, elevation of limbs, improve nutrition     -asking nursing again to apply HS ACE wraps   -can ace left elbow if needed.             LOS: 10 days A FACE TO FACE EVALUATION WAS PERFORMED  Meredith Staggers 09/27/2021, 11:35 AM

## 2021-09-27 NOTE — Progress Notes (Signed)
Physical Therapy Session Note  Patient Details  Name: Leon Lyons MRN: [id number] Date of Birth: 01-23-1933  Today's Date: 09/27/2021 PT Individual Time: 0800-0858 PT Individual Time Calculation (min): 58 min   Short Term Goals: Week 1:  PT Short Term Goal 1 (Week 1): Pt will perform supine<>sit with min assist PT Short Term Goal 2 (Week 1): Pt will perform sit<>stands using LRAD with min assist PT Short Term Goal 3 (Week 1): Pt will perform bed<>chair transfers using LRAD with min assist PT Short Term Goal 4 (Week 1): Pt will ambulate at least 8ft using LRAD with mod assist and +2 w/c follow if needed  Skilled Therapeutic Interventions/Progress Updates:     Pt received supine in bed, agreeable to PT tx. Reports no pain. Focused session on increasing activity tolerance and gait training. Pt reporting need to void and was continent of bladder with urinal in the bed.   Donned new clean brief, compression socks with totalA at bed level. Supine<>sit with HOB nearly flat, requiring modA overall for trunk support. Able to sit unsupported at EOB with SBA with VC for guided breathing. Donned pants with modA and shoes with totalA for time. Able to don button up shirt with minA for threading arms, able to button without assist. Completed sit<>Stand transfer from raised EOB to RW with minA for boosting to rise. Able to stand with min guard and completes stand<>pivot transfer with minA and RW with VC for safety and technique.   Transported in hallway for time and energy conservation where we focused remainder of session on functional gait training. He ambulated 15ft + 66ft + 72ft with minA and RW with +2 assist from wife with w/c follow for safety. Primary gait deficits are short shuffling steps, downward gaze, and decreased gait speed. No overt LOB or knee buckling present however pt c/o LLE weakness limiting his gait distances. SpO2 measured throughout session, fluctuating b/w 89-97% and required  extended seated rest breaks b/w gait trials.  Pt concluded session seated in w/c with all immediate needs met. Spoke with nursing to order incentive spirometer to assist with enhancing breathing as well as messaged MD regarding k-pad management for his back pains.  Therapy Documentation Precautions:  Precautions Precautions: Fall, Other (comment) Precaution Comments: monitor BP, HR, and SpO2; possible L shoulder RC injury ? Restrictions Weight Bearing Restrictions: No General:    Therapy/Group: Individual Therapy  Alger Simons 09/27/2021, 7:37 AM

## 2021-09-27 NOTE — Patient Care Conference (Signed)
Inpatient RehabilitationTeam Conference and Plan of Care Update Date: 09/27/2021   Time: 10:55 AM    Patient Name: Leon Lyons      Medical Record Number: [id number]  Date of Birth: 23-Feb-1933 Sex: Male         Room/Bed: 4Q68T/4H96Q-22 Payor Info: Payor: MEDICARE / Plan: MEDICARE PART A AND B / Product Type: *No Product type* /    Admit Date/Time:  09/17/2021  6:30 PM  Primary Diagnosis:  [facility] Problems: Principal Problem:   Debility    Expected Discharge Date: Expected Discharge Date: 10/11/21  Team Members Present: Physician leading conference: Dr. Alger Simons Social Worker Present: Loralee Pacas, [place] Nurse Present: Dorthula Nettles, RN PT Present: Tereasa Coop, PT OT Present: Cherylynn Ridges, OT PPS Coordinator present : Gunnar Fusi, SLP     Current Status/Progress Goal Weekly Team Focus  Bowel/Bladder   Continent of B/B. LBM 12/19. Assistance holding urinal.  Maintain continence. Reqain Strength to hold urinal.  Toilet PRN   Swallow/Nutrition/ Hydration             ADL's   Mod A sit<>sands min/mod A stand-pivot transfers, low endurance  min A  self-care retraining, transfers, activity tolerance, sit<>stands, pain management   Mobility   minA/modA sit to stand, minA gait x100' with RW  CGA to minA  Endurance, strength, transfers, ambulation   Communication             Safety/Cognition/ Behavioral Observations            Pain   C/O pain to lower back, and left shoulder. Scheduled pain patches, and prns available.  Pain <3/10  Assess Qshift and prn   Skin   Closed incision of lower abd. Remainder of skin intact.  Maintain skin integrity  Assess Qshift and PRn     Discharge Planning:  D/c to home with 24/7 care from his wife April.   Team Discussion: Monitoring nutritional status and breathing. Ordered K-pad today. A-fib is stable. Incontinent bowel. Reports 6/10 pain to left scapula, treating appropriately. Has 3 incisions, treating  appropriately. No new updates from SW. Has poor endurance. Has pain in left arm, back and legs when standing. Walked 25 ft in parallel bars, and oxygen saturation maintained.   Patient on target to meet rehab goals: Min assist goals overall.  *See Care Plan and progress notes for long and short-term goals.   Revisions to Treatment Plan:  Adjusting medications  Teaching Needs: Family education, medication/pain management, skin/wound care, safety awareness, transfer/gait training, etc.  Current Barriers to Discharge: Decreased caregiver support, Home enviroment access/layout, Incontinence, Wound care, Lack of/limited family support, Medication compliance, and Nutritional means  Possible Resolutions to Barriers: Family education Follow-up PT/OT Order recommended DME Provide nutritional support     Medical Summary Current Status: ongoing pain in his left shoulder, low back too. edema in LE's responding to ACE wraps. pulmonary status stable  Barriers to Discharge: Medical stability   Possible Resolutions to [employer] Focus: daily assessment of labs, pt data, optimize nutrition. pain control   Continued Need for Acute Rehabilitation Level of Care: The patient requires daily medical management by a physician with specialized training in physical medicine and rehabilitation for the following reasons: Direction of a multidisciplinary physical rehabilitation program to maximize functional independence : Yes Medical management of patient stability for increased activity during participation in an intensive rehabilitation regime.: Yes Analysis of laboratory values and/or radiology reports with any subsequent need for medication adjustment and/or medical intervention. :  Yes   I attest that I was present, lead the team conference, and concur with the assessment and plan of the team.   Cristi Loron 09/27/2021, 4:28 PM

## 2021-09-27 NOTE — Progress Notes (Signed)
Patient ID: Leon Lyons, male   DOB: [birth year], 87 y.o.   MRN: [id number]  SW met with pt and pt wife in room to provide updates from team conference,and d/c remains 1/3. SW will continue to provide updates.   Loralee Pacas, MSW, [place] Office: [phone] Cell: [phone] Fax: [phone]

## 2021-09-27 NOTE — Progress Notes (Signed)
Occupational Therapy Session Note  Patient Details  Name: Leon Lyons MRN: [id number] Date of Birth: 04-17-1933  Today's Date: 09/27/2021 OT Individual Time: 1102-1200 OT Individual Time Calculation (min): 58 min    Short Term Goals: Week 2:  OT Short Term Goal 1 (Week 2): PT will be able to thread LB clothing with min A with AE prn OT Short Term Goal 2 (Week 2): Pt will consistently stand with mod A at the sink in preparation for BADL tasks OT Short Term Goal 3 (Week 2): Patient will demonstrate R elbow ROM for edema management with min cues OT Short Term Goal 4 (Week 2): Patient will complete 1 step of toileting task  Skilled Therapeutic Interventions/Progress Updates:    Pt greeted seated in wc and agreeable to OT treatment session. PT declined need for BADL tasks today. Pt brought down to therapy gym and worked on sit<>stands, standing balance/endurance, and functional use of B UE's with BITS. Pt completed 5 sit<>stands with min/mod A to power up w/ RW. Pt tolerated 3, 1 minute stands with rest breaks in between. Incorporated alternating UE's to push buttons with CGA for balance. Pt then able to stand for 2 minutes bouts with similar assistance. LB there-ex with 3 sets of 5 semi-squats from wc, and seated hip flexion. Pt returned to room and left seated in wc with wife present and needs met.   Therapy Documentation Precautions:  Precautions Precautions: Fall, Other (comment) Precaution Comments: monitor BP, HR, and SpO2; possible L shoulder RC injury ? Restrictions Weight Bearing Restrictions: No Pain: Pain Assessment Pain Scale: 0-10 Pain Score: 0-No pain   Therapy/Group: Individual Therapy  Valma Cava 09/27/2021, 12:01 PM

## 2021-09-27 NOTE — Progress Notes (Signed)
Physical Therapy Weekly Progress Note  Patient Details  Name: Leon Lyons MRN: [id number] Date of Birth: May 17, 1933  Beginning of progress report period: September 18, 2021 End of progress report period: September 27, 2021  Today's Date: 09/27/2021 PT Individual Time: 5056-9794 PT Individual Time Calculation (min): 24 min   Patient has met 1 of 4 short term goals.  Pt is progressing well toward mobility goals, improving independence with bed mobility, functional transfers, and ambulation. Pt still requiring up to modA for for bed mobility and transfers, but is ambulating up to 100' with RW and minA with +2 for WC follow. Pt had been limited significantly by impaired endurance, but has demonstrated improvements in endurance over recording period. [name] has been present for most sessions and is completing family education.   Patient continues to demonstrate the following deficits muscle weakness, decreased cardiorespiratoy endurance, and decreased sitting balance, decreased standing balance, and decreased balance strategies and therefore will continue to benefit from skilled PT intervention to increase functional independence with mobility.  Patient progressing toward long term goals..  Continue plan of care.  PT Short Term Goals Week 1:  PT Short Term Goal 1 (Week 1): Pt will perform supine<>sit with min assist PT Short Term Goal 1 - Progress (Week 1): Progressing toward goal PT Short Term Goal 2 (Week 1): Pt will perform sit<>stands using LRAD with min assist PT Short Term Goal 2 - Progress (Week 1): Progressing toward goal PT Short Term Goal 3 (Week 1): Pt will perform bed<>chair transfers using LRAD with min assist PT Short Term Goal 3 - Progress (Week 1): Progressing toward goal PT Short Term Goal 4 (Week 1): Pt will ambulate at least 48ft using LRAD with mod assist and +2 w/c follow if needed PT Short Term Goal 4 - Progress (Week 1): Met Week 2:  PT Short Term Goal 1 (Week 2):  Pt will perform bed mobility consistently with minA. PT Short Term Goal 2 (Week 2): Pt will perform sit to stand consistently with minA. PT Short Term Goal 3 (Week 2): Pt will perform bed to chair transfer consistently with minA. PT Short Term Goal 4 (Week 2): Pt will ambulate x150' with minA and LRAD.  Skilled Therapeutic Interventions/Progress Updates:     Pt received seated in [facility] and agrees to therapy. No complaint of pain. WC transport to skywalk for energy conservation. Pt performs sit to stand with PT providing demonstration of adequate forward trunk lean, then pt performing with minA/modA and tactile cueing for initiation and sequencing. Pt ambulates x50' with RW and minA, with +2 WC follow for safety. PT cues for decreased WB through arms for energy conservation, and pursed lip breathing to optimize oxygen sats. Following extended seated rest break, pt ambulates additional 100' with same assist level and cueing. Pt left seated in WC with legs elevated and all needs within reach.  Therapy Documentation Precautions:  Precautions Precautions: Fall, Other (comment) Precaution Comments: monitor BP, HR, and SpO2; possible L shoulder RC injury ? Restrictions Weight Bearing Restrictions: No   Therapy/Group: Individual Therapy  Breck Coons, PT, DPT 09/27/2021, 3:51 PM

## 2021-09-28 ENCOUNTER — Inpatient Hospital Stay: Payer: Medicare Other | Admitting: Internal Medicine

## 2021-09-28 ENCOUNTER — Inpatient Hospital Stay: Payer: Medicare Other

## 2021-09-28 ENCOUNTER — Inpatient Hospital Stay (HOSPITAL_COMMUNITY): Payer: Medicare Other

## 2021-09-28 MED ORDER — LEVALBUTEROL HCL 0.63 MG/3ML IN NEBU
0.6300 mg | INHALATION_SOLUTION | Freq: Four times a day (QID) | RESPIRATORY_TRACT | Status: DC | PRN
  Filled 2021-09-28: qty 3

## 2021-09-28 MED ORDER — LEVALBUTEROL HCL 0.63 MG/3ML IN NEBU
0.6300 mg | INHALATION_SOLUTION | Freq: Four times a day (QID) | RESPIRATORY_TRACT | Status: DC | PRN
  Administered 2021-09-28: 11:00:00 0.63 mg via RESPIRATORY_TRACT
  Filled 2021-09-28 (×2): qty 3

## 2021-09-28 MED ORDER — BUDESONIDE 0.25 MG/2ML IN SUSP
0.2500 mg | Freq: Two times a day (BID) | RESPIRATORY_TRACT | Status: DC
  Administered 2021-09-28 – 2021-10-10 (×23): 0.25 mg via RESPIRATORY_TRACT
  Filled 2021-09-28 (×29): qty 2

## 2021-09-28 MED ORDER — LEVALBUTEROL HCL 0.63 MG/3ML IN NEBU
0.6300 mg | INHALATION_SOLUTION | Freq: Four times a day (QID) | RESPIRATORY_TRACT | Status: DC
Start: 2021-09-28 — End: 2021-09-30
  Administered 2021-09-28 – 2021-09-30 (×7): 0.63 mg via RESPIRATORY_TRACT
  Filled 2021-09-28 (×9): qty 3

## 2021-09-28 NOTE — Progress Notes (Addendum)
°  Patient currently resting comfortably supine in bed still complaining of SOB. No chest pain. Wife at bedside.   Alert and oriented. Maintaining 100% SaO2 on 2 L [place].  Heart rate variable, rhythm irregular. No accessory muscle use. Can speak in full sentences. No diaphoresis. No pedal edema. Lungs: few crackles right base and decreased BS left base.  CXR reviewed:  Narrative & Impression  CLINICAL DATA:  Shortness of breath.   EXAM: CHEST - 2 VIEW   COMPARISON:  09/24/2021   FINDINGS: Left base collapse/consolidation with left parahilar airspace disease and left pleural effusion is similar to prior. Airspace opacity in the infrahilar right lung base is similar. Right PICC line again noted. The visualized bony structures of the thorax show no acute abnormality.   IMPRESSION: No substantial change. Left base collapse/consolidation with left pleural effusion and medial right base airspace disease.     Electronically Signed   By: Misty Stanley M.D.   On: 09/28/2021 10:10   Discussed nebulizer order with attendant RN and charge nurse. Will obtain 12 lead EKG and update patient weight.

## 2021-09-28 NOTE — Progress Notes (Signed)
PROGRESS NOTE   Subjective/Complaints:  Pt awoke more sob. "Can't take a deep breath" Nurse says there may be a little anxiety too. Sats down in 80's which recovered with 2L O2 into the 90's. Some associated cough which he has had off an on  ROS: Patient denies fever, rash, sore throat, blurred vision, nausea, vomiting, diarrhea,  chest pain, joint or back pain, headache, .    Objective:   No results found. Recent Labs    09/27/21 0836  WBC 6.0  HGB 9.4*  HCT 30.3*  PLT 322    Recent Labs    09/26/21 0318  NA 136  K 3.5  CL 99  CO2 34*  GLUCOSE 87  BUN 10  CREATININE 0.55*  CALCIUM 8.7*    Intake/Output Summary (Last 24 hours) at 09/28/2021 0811 Last data filed at 09/28/2021 0457 Gross per 24 hour  Intake --  Output 2175 ml  Net -2175 ml        Physical Exam: Vital Signs Blood pressure 127/79, pulse 90, temperature 97.7 F (36.5 C), temperature source Oral, resp. rate 18, height 5\' 10"  (1.778 m), weight 74 kg, SpO2 94 %.    Constitutional: No distress . Vital signs reviewed. HEENT: NCAT, EOMI, oral membranes moist Neck: supple Cardiovascular: RRR without murmur. No JVD    Respiratory/Chest: CTA Bilaterally without wheezes or rales. Normal effort    GI/Abdomen: BS +, non-tender, non-distended Ext: no clubbing, cyanosis  Psych: pleasant and cooperative    Musculoskeletal:     Cervical back: Normal range of motion. No rigidity.    left scapular tenderness. LE edema better, still some at left elbow Neurological:     Comments: Intact to light touch in all 4 extremities B/L  CN's intact on exam. Good insight and awareness. Ox3- appropriate   RUE- 5-/5 in deltoid, biceps, triceps, WE, grip and FA LUE- deltoid 2-/5; weak vs pain; Biceps 4-/5; Triceps 3-/5; WE 4-/5 grip 4-/5; FA 4-/5 RLE- 4+/5 in HF, KE, KF DF and PF LLE- Proximal 3to 3+5; distal 4-/5--no changes Skin:    General: Skin is warm and  dry.     groin and abdominal wounds are CDI       Assessment/Plan: 1. Functional deficits which require 3+ hours per day of interdisciplinary therapy in a comprehensive inpatient rehab setting. Physiatrist is providing close team supervision and 24 hour management of active medical problems listed below. Physiatrist and rehab team continue to assess barriers to discharge/monitor patient progress toward functional and medical goals  Care Tool:  Bathing    Body parts bathed by patient: Right arm, Left arm, Chest, Face   Body parts bathed by helper: Abdomen, Front perineal area, Buttocks, Right upper leg, Left upper leg, Right lower leg, Left lower leg     Bathing assist Assist Level: Moderate Assistance - Patient 50 - 74%     Upper Body Dressing/Undressing Upper body dressing   What is the patient wearing?: Pull over shirt    Upper body assist Assist Level: Maximal Assistance - Patient 25 - 49%    Lower Body Dressing/Undressing Lower body dressing      What is the patient wearing?: Pants  Lower body assist Assist for lower body dressing: Maximal Assistance - Patient 25 - 49%     Toileting Toileting    Toileting assist Assist for toileting: Total Assistance - Patient < 25% (required urinal)     Transfers Chair/bed transfer  Transfers assist  Chair/bed transfer activity did not occur: Refused  Chair/bed transfer assist level: Minimal Assistance - Patient > 75% Chair/bed transfer assistive device: Programmer, multimedia   Ambulation assist   Ambulation activity did not occur: Safety/medical concerns  Assist level: 2 helpers Assistive device: Walker-rolling Max distance: 5'   Walk 10 feet activity   Assist  Walk 10 feet activity did not occur: Safety/medical concerns  Assist level: 2 helpers (min assist of 1 with w/c follow) Assistive device: Walker-rolling   Walk 50 feet activity   Assist Walk 50 feet with 2 turns activity did not  occur: Safety/medical concerns         Walk 150 feet activity   Assist Walk 150 feet activity did not occur: Safety/medical concerns         Walk 10 feet on uneven surface  activity   Assist Walk 10 feet on uneven surfaces activity did not occur: Safety/medical concerns         Wheelchair     Assist Is the patient using a wheelchair?: Yes Type of Wheelchair: Manual    Wheelchair assist level: Supervision/Verbal cueing Max wheelchair distance: 40    Wheelchair 50 feet with 2 turns activity    Assist        Assist Level: Dependent - Patient 0%   Wheelchair 150 feet activity     Assist      Assist Level: Dependent - Patient 0%   Blood pressure 127/79, pulse 90, temperature 97.7 F (36.5 C), temperature source Oral, resp. rate 18, height 5\' 10"  (1.778 m), weight 74 kg, SpO2 94 %.  Assessment/Plan: Functional deficits secondary  to Debility from MSSA bactermia/PE/endovascular repair of aortic leak; PE and Previous styage IIIB lung CA with new hepatic and bone metastases.  ELOS-  10/11/21 Pt may shower -Continue CIR therapies including PT, OT     2. DVT/R lieofemoral DVT- Lovenox 70 mg BID- until CT in 4 weeks by Vascular with potential switch to oral 3. Pain Management: Tylenola nd Percocet as needed for pain- for LUE/LLE and back pain .   12/12 prn percocet,  Restarted Gabapentin 300 mg QHS-   12/20 left shoulder pain.   lytic lesions. 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     -continue lidocaine patches for low back   -scheduled one percocet daily at 0700 prior to therapy    -added kpad  4. Mood:-  Palliative care following due to his cancer and ego support as required-  ?neuropsychology    5. Skin/Wound Care: encouraging nutrition  -wounds healing 6. . Fluids/Electrolytes/Nutrition: encourage PO -pt is drinking protein supps   7. MSSA bacteremia- is on Cefazolin q8 hours for 4 weeks from 09/09/21 -thoracentesis cultures are negative. Fungal cx with gs still pending  8. Persistent Afib- is recent diagnosis- Metoprolol just increased to 75 mg BID and on Diltiazem 120 mg daily- monitor for hypotension and hold meds if BP <100.    Vitals:   09/28/21 0344 09/28/21 0741  BP: (!) 151/92 127/79  Pulse: 85 90  Resp: 18  Temp: 97.7 F (36.5 C)   SpO2: 93% 94%    12/21 stable 9. S/p aortic leak/endovascular repair- periaortic hematoma- vascular to reorder at CT in 4 weeks to follow up 10. Previous Stage IIIB Lung CA- with new hepatic and bone mets- likely Stage IV-  -palliative care follow up appreciated -pt is DNR - F/U with Oncology and maybe hospice after d/c.  12/21-DOE again this morning with decreased sats  -check cxr  -oxygen  -xopenex HHN   -anxiety control 11. Constipation-    12/19 had large bm 12/18 12. PICC line for IV ABX- as of 09/17/21 13. LE Edema--continue KHT's, elevation of limbs, improve nutrition     -asking nursing again to apply HS ACE wraps   -  ace left elbow if needed.             LOS: 11 days A FACE TO FACE EVALUATION WAS PERFORMED  Meredith Staggers 09/28/2021, 8:11 AM

## 2021-09-28 NOTE — Progress Notes (Signed)
Occupational Therapy Session Note  Patient Details  Name: Leon Lyons MRN: [id number] Date of Birth: August 13, 1933  Today's Date: 09/28/2021 OT Individual Time: 0900-1000 OT Individual Time Calculation (min): 60 min    Short Term Goals: Week 1:  OT Short Term Goal 1 (Week 1): Pt will perform sit to stand with mod A 50% of time without the STEDY for clothing mangement OT Short Term Goal 1 - Progress (Week 1): Met OT Short Term Goal 2 (Week 1): Pt will be able to transfer with mod A 50% of the time to the toilet/ BSC OT Short Term Goal 2 - Progress (Week 1): Met OT Short Term Goal 3 (Week 1): PT will be able to thread LB clothing with min A with AE prn OT Short Term Goal 3 - Progress (Week 1): Progressing toward goal Week 2:  OT Short Term Goal 1 (Week 2): PT will be able to thread LB clothing with min A with AE prn OT Short Term Goal 2 (Week 2): Pt will consistently stand with mod A at the sink in preparation for BADL tasks OT Short Term Goal 3 (Week 2): Patient will demonstrate R elbow ROM for edema management with min cues OT Short Term Goal 4 (Week 2): Patient will complete 1 step of toileting task Week 3:     Skilled Therapeutic Interventions/Progress Updates:    The pt was sitting in his recliner with his wife present at the time of arrival.  The pt complete sit to stand incorporating the RW for dynamic standing balance for functional task performance with ModA. The pt was able to maintain standing position 3 times for a count of 10.  The pt was able to go to the sink area for simple grooming task, for washing his face, brushing his teeth and combing his hair with s/u A.  The pt had a pain response of 6 on a 0-10 scale  in relation to his lower back.  Nursing came in to inform the patient that he would be going down for X-rays.  The pt was able to transfer from the w/c to bed level with ModA.  He was able to tolerate  supine position with pillows in place for comfort for transport.   The pt was able to maintain O2 saturation at 100 %  during this treatment session.   Therapy Documentation Precautions:  Precautions Precautions: Fall, Other (comment) Precaution Comments: monitor BP, HR, and SpO2; possible L shoulder RC injury ? Restrictions Weight Bearing Restrictions: No General:   Vital Signs: Therapy Vitals Pulse Rate: (!) 59 Resp: 20 Patient Position (if appropriate): Lying Oxygen Therapy SpO2: 98 % O2 Device: Nasal Cannula O2 Flow Rate (L/min): 2 L/min Pain: Pain Assessment Pain Scale: 0-10 Pain Score: 0-No pain Vision   Perception    Praxis   Balance   Exercises:   Other Treatments:     Therapy/Group: Individual Therapy  Yvonne Kendall 09/28/2021, 12:36 PM

## 2021-09-28 NOTE — Progress Notes (Signed)
Pt called complaining of shortness of breath. Pt was boosted up in bed and placed on 2L [place] o2. Stated he felt much better. Stats in the high 90s. Call bell within reach, no further complaints at this time.

## 2021-09-28 NOTE — Progress Notes (Signed)
Physical Therapy Session Note  Patient Details  Name: Leon Lyons MRN: [id number] Date of Birth: January 27, 1933  Today's Date: 09/28/2021 PT Individual Time: 1121-6244 PT Individual Time Calculation (min): 57 min   Short Term Goals: Week 2:  PT Short Term Goal 1 (Week 2): Pt will perform bed mobility consistently with minA. PT Short Term Goal 2 (Week 2): Pt will perform sit to stand consistently with minA. PT Short Term Goal 3 (Week 2): Pt will perform bed to chair transfer consistently with minA. PT Short Term Goal 4 (Week 2): Pt will ambulate x150' with minA and LRAD.  Skilled Therapeutic Interventions/Progress Updates:     Pt received seated in [facility] and agrees to therapy. Reports some pain in L shoulder, chronic in nature and number not provided. PT provides rest breaks as needed to manage pain. Pt reports not feeling well due to being "up and down" all day and having to be back on 2L supplemental oxygen due to difficulty maintaining adequate oxygen sats. WC transport to gym for time management. Pt performs sit to stand transfer with RW and minA, then ambulates x20' with minA and RW, with cues for upright gaze to improve posture and balance. Pt then completes Nustep, 3x4:00 with extended seated rest breaks in between each bout due to pt feeling fatigued and with some difficulty breathing. Vitals assessed during rest break and pt's O2 is at 99% and HR ~100. PT educates on importance of energy conservation, as well as use of incentive spirometer, with cues on correct performance.  Pt performs sit to stand from Nustep with minA and ambulates x20' to mat table with RW. Pt practices sit to stand transfers from elevated mat table, set at approximately same height as pt's bed at home. PT demonstrates body mechanics and sequencing of transfer. Pt then performs x2 reps with light minA and cues for initiation and sequencing. Pt ambulates back to WC, x10', with RW and minA. Left seated with alarm intact  and all needs within reach.  Therapy Documentation Precautions:  Precautions Precautions: Fall, Other (comment) Precaution Comments: monitor BP, HR, and SpO2; possible L shoulder RC injury ? Restrictions Weight Bearing Restrictions: No    Therapy/Group: Individual Therapy  Breck Coons, PT, DPT 09/28/2021, 3:51 PM

## 2021-09-28 NOTE — Progress Notes (Signed)
Physical Therapy Session Note  Patient Details  Name: Leon Lyons MRN: [id number] Date of Birth: [birth year]  Today's Date: 09/28/2021 PT Individual Time: 0800-0900 PT Individual Time Calculation (min): 60 min   Short Term Goals: Week 2:  PT Short Term Goal 1 (Week 2): Pt will perform bed mobility consistently with minA. PT Short Term Goal 2 (Week 2): Pt will perform sit to stand consistently with minA. PT Short Term Goal 3 (Week 2): Pt will perform bed to chair transfer consistently with minA. PT Short Term Goal 4 (Week 2): Pt will ambulate x150' with minA and LRAD.  Skilled Therapeutic Interventions/Progress Updates:    Pt received seated in bed finishing breakfast, agreeable to PT session. Pt reports some chronic back pain at rest, premedicated prior to start of therapy seession and declines intervention. Assisted pt with donning knee-high TEDs and ACE-wrapping BLE for edema management. Pt on 2.5L O2 at rest, SpO2 96% at rest. Supine to sit with min A for trunk elevation with HOB slightly elevated. Assisted pt with donning pants, shirt, and shoes while seated EOB. Sit to stand with mod A to RW from slightly elevated bed with cues for anterior weight shift. Stand pivot transfer to w/c with RW and min A. Pt put on room air for transfer due to short oxygen tubing, SpO2 drops to 85%. Pt placed back on 2.5L and SpO2 returns to 90% (+) with pursed lip breathing and seated rest break. Ambulation 2 x 50 with RW and min A for balance with close w/c follow due to decreased overall endurance. Pt on 3L O2 via [place] during gait, SpO2 drops to 87% following second bout of ambulation but returns to 90% (+). Pt encouraged to sit up in recliner vs w/c so that he can elevate BLE. Pt left seated in recliner in room with needs in reach, wife present.  Therapy Documentation Precautions:  Precautions Precautions: Fall, Other (comment) Precaution Comments: monitor BP, HR, and SpO2; possible L shoulder RC injury  ? Restrictions Weight Bearing Restrictions: No    Therapy/Group: Individual Therapy   Excell Seltzer, PT, DPT, CSRS  09/28/2021, 12:04 PM

## 2021-09-29 DIAGNOSIS — R5381 Other malaise: Secondary | ICD-10-CM | POA: Diagnosis not present

## 2021-09-29 DIAGNOSIS — C61 Malignant neoplasm of prostate: Secondary | ICD-10-CM | POA: Diagnosis not present

## 2021-09-29 DIAGNOSIS — M25512 Pain in left shoulder: Secondary | ICD-10-CM | POA: Diagnosis not present

## 2021-09-29 DIAGNOSIS — R600 Localized edema: Secondary | ICD-10-CM | POA: Diagnosis not present

## 2021-09-29 NOTE — Progress Notes (Signed)
PROGRESS NOTE   Subjective/Complaints:  Pt feeling better today. Breathing treatments helped. Happy that swelling is down  ROS: Patient denies fever, rash, sore throat, blurred vision, nausea, vomiting, diarrhea,  or chest pain,  headache, or mood change.    Objective:   DG Chest 2 View  Result Date: 09/28/2021 CLINICAL DATA:  Shortness of breath. EXAM: CHEST - 2 VIEW COMPARISON:  09/24/2021 FINDINGS: Left base collapse/consolidation with left parahilar airspace disease and left pleural effusion is similar to prior. Airspace opacity in the infrahilar right lung base is similar. Right PICC line again noted. The visualized bony structures of the thorax show no acute abnormality. IMPRESSION: No substantial change. Left base collapse/consolidation with left pleural effusion and medial right base airspace disease. Electronically Signed   By: Misty Stanley M.D.   On: 09/28/2021 10:10   Recent Labs    09/27/21 0836  WBC 6.0  HGB 9.4*  HCT 30.3*  PLT 322    No results for input(s): NA, K, CL, CO2, GLUCOSE, BUN, CREATININE, CALCIUM in the last 72 hours.   Intake/Output Summary (Last 24 hours) at 09/29/2021 1143 Last data filed at 09/29/2021 1100 Gross per 24 hour  Intake 556 ml  Output 1825 ml  Net -1269 ml        Physical Exam: Vital Signs Blood pressure 139/79, pulse 87, temperature 97.6 F (36.4 C), resp. rate 18, height 5\' 10"  (1.778 m), weight 74 kg, SpO2 92 %.    Constitutional: No distress . Vital signs reviewed. HEENT: NCAT, EOMI, oral membranes moist Neck: supple Cardiovascular: RRR without murmur. No JVD    Respiratory/Chest: CTA Bilaterally without wheezes or rales. Normal effort    GI/Abdomen: BS +, non-tender, non-distended Ext: no clubbing, cyanosis, or edema Psych: pleasant and cooperative  Musculoskeletal:     low back pain. Pain left scapula.    left scapular tenderness. LE edema much better, trace  at left elbow Neurological:     Comments: Intact to light touch in all 4 extremities B/L  CN's intact on exam. Good insight and awareness. Ox3- appropriate   RUE- 5-/5 in deltoid, biceps, triceps, WE, grip and FA LUE- deltoid 2-/5; weak vs pain; Biceps 4-/5; Triceps 3-/5; WE 4-/5 grip 4-/5; FA 4-/5 RLE- 4+/5 in HF, KE, KF DF and PF LLE- Proximal 3to 3+5; distal 4-/5--stable appearance Skin:    General: Skin is warm and dry.     groin and abdominal wounds are CDI       Assessment/Plan: 1. Functional deficits which require 3+ hours per day of interdisciplinary therapy in a comprehensive inpatient rehab setting. Physiatrist is providing close team supervision and 24 hour management of active medical problems listed below. Physiatrist and rehab team continue to assess barriers to discharge/monitor patient progress toward functional and medical goals  Care Tool:  Bathing    Body parts bathed by patient: Right arm, Left arm, Chest, Face   Body parts bathed by helper: Abdomen, Front perineal area, Buttocks, Right upper leg, Left upper leg, Right lower leg, Left lower leg     Bathing assist Assist Level: Moderate Assistance - Patient 50 - 74%     Upper Body Dressing/Undressing Upper  body dressing   What is the patient wearing?: Pull over shirt    Upper body assist Assist Level: Maximal Assistance - Patient 25 - 49%    Lower Body Dressing/Undressing Lower body dressing      What is the patient wearing?: Pants     Lower body assist Assist for lower body dressing: Maximal Assistance - Patient 25 - 49%     Toileting Toileting    Toileting assist Assist for toileting: Total Assistance - Patient < 25% (required urinal)     Transfers Chair/bed transfer  Transfers assist  Chair/bed transfer activity did not occur: Refused  Chair/bed transfer assist level: Minimal Assistance - Patient > 75% Chair/bed transfer assistive device: Arboriculturist   Ambulation assist   Ambulation activity did not occur: Safety/medical concerns  Assist level: 2 helpers Assistive device: Walker-rolling Max distance: 50'   Walk 10 feet activity   Assist  Walk 10 feet activity did not occur: Safety/medical concerns  Assist level: 2 helpers Assistive device: Walker-rolling   Walk 50 feet activity   Assist Walk 50 feet with 2 turns activity did not occur: Safety/medical concerns  Assist level: 2 helpers Assistive device: Walker-rolling    Walk 150 feet activity   Assist Walk 150 feet activity did not occur: Safety/medical concerns         Walk 10 feet on uneven surface  activity   Assist Walk 10 feet on uneven surfaces activity did not occur: Safety/medical concerns         Wheelchair     Assist Is the patient using a wheelchair?: Yes Type of Wheelchair: Manual    Wheelchair assist level: Supervision/Verbal cueing Max wheelchair distance: 40    Wheelchair 50 feet with 2 turns activity    Assist        Assist Level: Dependent - Patient 0%   Wheelchair 150 feet activity     Assist      Assist Level: Dependent - Patient 0%   Blood pressure 139/79, pulse 87, temperature 97.6 F (36.4 C), resp. rate 18, height 5\' 10"  (1.778 m), weight 74 kg, SpO2 92 %.  Assessment/Plan: Functional deficits secondary  to Debility from MSSA bactermia/PE/endovascular repair of aortic leak; PE and Previous styage IIIB lung CA with new hepatic and bone metastases.  ELOS-  10/11/21 Pt may shower -Continue CIR therapies including PT, OT   2. DVT/R lieofemoral DVT- Lovenox 70 mg BID- until CT in 4 weeks by Vascular with potential switch to oral 3. Pain Management: Tylenola nd Percocet as needed for pain- for LUE/LLE and back pain .   12/12 prn percocet,  Restarted Gabapentin 300 mg QHS-   12/20 left shoulder pain.   lytic lesions. He's had chronic RTC problems     -now on diclofenac  patch   -consider steroid injection but suspect a lot of pain is related to  bony mets in scapula.     -continue lidocaine patches for low back   -scheduled one percocet daily at 0700 prior to therapy    -continue kpad  4. Mood:-  Palliative care following due to his cancer and ego support as required-  ?neuropsychology    5. Skin/Wound Care: encouraging nutrition  -wounds healing 6. . Fluids/Electrolytes/Nutrition: encourage PO -pt is drinking protein supps  7. MSSA bacteremia- is on Cefazolin q8 hours for 4 weeks from 09/09/21 -thoracentesis cultures are negative. Fungal cx with gs still pending  8. Persistent Afib- is recent diagnosis- Metoprolol just  increased to 75 mg BID and on Diltiazem 120 mg daily- monitor for hypotension and hold meds if BP <100.    Vitals:   09/29/21 0528 09/29/21 0839  BP: 139/79   Pulse: 87   Resp: 18   Temp: 97.6 F (36.4 C)   SpO2: 93% 92%    12/21 stable 9. S/p aortic leak/endovascular repair- periaortic hematoma- vascular to reorder at CT in 4 weeks to follow up 10. Previous Stage IIIB Lung CA- with new hepatic and bone mets- likely Stage IV-  -palliative care follow up appreciated -pt is DNR - F/U with Oncology and maybe hospice after d/c.  12/22 intermittent DOE   -cxr without significant change  -oxygen  -xopenex and pulmicort scheduled HHN have helped  -anxiety control 11. Constipation-    12/19 had large bm 12/18 12. PICC line for IV ABX- as of 09/17/21 13. LE Edema--continue KHT's, elevation of limbs, improve nutrition     -improving -continue HS ACE wraps   -  ace left elbow if needed.             LOS: 12 days A FACE TO FACE EVALUATION WAS PERFORMED  Meredith Staggers 09/29/2021, 11:43 AM

## 2021-09-29 NOTE — Progress Notes (Signed)
Occupational Therapy Session Note  Patient Details  Name: Leon Lyons MRN: [id number] Date of Birth: 1933-08-30  Today's Date: 09/29/2021 OT Individual Time: 0900-1004 OT Individual Time Calculation (min): 64 min    Short Term Goals: Week 1:  OT Short Term Goal 1 (Week 1): Pt will perform sit to stand with mod A 50% of time without the STEDY for clothing mangement OT Short Term Goal 1 - Progress (Week 1): Met OT Short Term Goal 2 (Week 1): Pt will be able to transfer with mod A 50% of the time to the toilet/ BSC OT Short Term Goal 2 - Progress (Week 1): Met OT Short Term Goal 3 (Week 1): PT will be able to thread LB clothing with min A with AE prn OT Short Term Goal 3 - Progress (Week 1): Progressing toward goal Week 2:  OT Short Term Goal 1 (Week 2): PT will be able to thread LB clothing with min A with AE prn OT Short Term Goal 2 (Week 2): Pt will consistently stand with mod A at the sink in preparation for BADL tasks OT Short Term Goal 3 (Week 2): Patient will demonstrate R elbow ROM for edema management with min cues OT Short Term Goal 4 (Week 2): Patient will complete 1 step of toileting task Week 3:     Skilled Therapeutic Interventions/Progress Updates:    Patient seen for BADL related task in bathing at sink LOF.  The pt presented with a O2 level of 98% prior to treatment.  The pt was able to go from supine in bed to EOB with Min/ModA secondary to low back pain and gen muscle weakness.  He was able to transfer from the EOB to w/c with ModA. The patient was able to wash his face, chest, and private area  with s/u  assist, he required MaxA for his bottom and LE's.  The pt  was able to brush his teeth with s/u assist, further, his back pain was noted at the level of 8 on a 0-10 scale. Nursing indicated that she had administered tylenol prior to treatment.  The pt was able to complete UB dressing with s/u, he required ModA for LB dressing secondary to his pain response. The pt was  able to transfer back to bed with [place] incorporating the RW for independence and safety.  He require MinA and verbal cues for managing his LE's .  The pt hand his bed side table within reach, his alarm was activated and all additional needs were address.  [name] was present throughout the treatment session, he presented with 02 saturation of 94% at the close of treatment.   Therapy Documentation Precautions:  Precautions Precautions: Fall, Other (comment) Precaution Comments: monitor BP, HR, and SpO2; possible L shoulder RC injury ? Restrictions Weight Bearing Restrictions: No General:   Vital Signs: Oxygen Therapy SpO2: 92 % O2 Device: Nasal Cannula O2 Flow Rate (L/min): 2 L/min Pain: Pain Assessment Pain Scale: 0-10 Pain Score: 4  Pain Type: Chronic pain Pain Location: Back Pain Orientation: Left Pain Radiating Towards: shou Pain Descriptors / Indicators: Aching Pain Frequency: Intermittent Pain Onset: On-going Patients Stated Pain Goal: 1 Pain Intervention(s): Medication (See eMAR) A Vision   Perception    Praxis   Balance   Exercises:   Other Treatments:     Therapy/Group: Individual Therapy  Leon Lyons 09/29/2021, 12:23 PM

## 2021-09-29 NOTE — Progress Notes (Signed)
Physical Therapy Session Note  Patient Details  Name: Leon Lyons MRN: [id number] Date of Birth: Feb 19, 1933  Today's Date: 09/29/2021 PT Individual Time: 6270-3500 PT Individual Time Calculation (min): 41 min   Short Term Goals: Week 2:  PT Short Term Goal 1 (Week 2): Pt will perform bed mobility consistently with minA. PT Short Term Goal 2 (Week 2): Pt will perform sit to stand consistently with minA. PT Short Term Goal 3 (Week 2): Pt will perform bed to chair transfer consistently with minA. PT Short Term Goal 4 (Week 2): Pt will ambulate x150' with minA and LRAD.  Skilled Therapeutic Interventions/Progress Updates:     Pt received seated in WC in daryoom for group activity sing-a-long holiday songs. Pt agreeable to therapy and no complaint of pain. Pt performs 2x10 LAQs and seated hip flexion with 2lb ankle weights, with cues for full ROM and pacing to ensure optimal performance. Pt performs multiple reps of sit to stand during session, requiring minA to facilitate adequate anterior weight shift, with cues for initiation and sequencing. Pt on 2L oxygen initially and ambulates x85' with RW and CGA, with cues to increase stride length, as well as hand placement for safety with return to [facility]. [name] provides +2 WC follow. Vitals assessed afterward and O2 at 94%. Following extended seated rest break, PT removes supplemental oxygen and has pt perform additional bout of ambulation. Pt ambulates x140' with RW and CGA with cues for pursed lip breathing. Vitals taken afterward with O2 dropping to 89% for several seconds but quickly rebounding to >90%. Following additional seated rest break, pt ambulates final bout of 80'. WC transport back to room. Pt left seated with all needs within reach.  Therapy Documentation Precautions:  Precautions Precautions: Fall, Other (comment) Precaution Comments: monitor BP, HR, and SpO2; possible L shoulder RC injury ? Restrictions Weight Bearing  Restrictions: No   Therapy/Group: Individual Therapy  Breck Coons, PT, DPT 09/29/2021, 12:57 PM

## 2021-09-29 NOTE — Progress Notes (Signed)
Physical Therapy Session Note  Patient Details  Name: Leon Lyons MRN: [id number] Date of Birth: July 11, 1933  Today's Date: 09/29/2021 PT Individual Time: 1035-1100 and 1443-1540 PT Individual Time Calculation (min): 25 min and 54 min  Short Term Goals: Week 2:  PT Short Term Goal 1 (Week 2): Pt will perform bed mobility consistently with minA. PT Short Term Goal 2 (Week 2): Pt will perform sit to stand consistently with minA. PT Short Term Goal 3 (Week 2): Pt will perform bed to chair transfer consistently with minA. PT Short Term Goal 4 (Week 2): Pt will ambulate x150' with minA and LRAD.  Skilled Therapeutic Interventions/Progress Updates: Pt presented in bed agreeable to therapy. Pt c/o pain in low back but did not rate, per pt and wife pt premedicated. Pt agreeable to bed mobility and ambulation. Pt performed supine to sit with CGA, use of bed features, and increased time. [name] donned shoes for time management. Pt performed Sit to stand with minA with pt performing rocking for momentum however then attempting to power up pushes straight up vs performing anterior lean. Performed stand pivot to w/c with minA. Pt transported to hallway on 4th floor and ambulated ~40f with RW and CGA with w/c follow. Pt maintained SpO2 >95% after ambulation on 2L supplemental O2. Pt transported remaining distance to day room for holiday special. Pt left in w/c with next therapists noted of pt's disposition.   Tx2: Pt presented in w/c with wife present agreeable to therapy. Pt c/o back pain 8/10, nsg notified and pain meds received during session. After notifying nsg when PTA returned to room pt near crying due to pt. Pt states does not feel like he could participate in therapy due to pain. Adv that nsg coming with meds with pt indicating that meds will not kick in for 20-30 min after received. Pt agreeable to transfer to bed and work on supine activities. Pt performed Sit to stand with minA and pt  requiring facilitation to increase anterior lean to stand. Pt performed ambulatory transfer to bed with CGA and performed sit to supine with modA for BLE management. PTA performed SKC stretch 30sec x 3 then placed physioball and performed DKC with 3-5 sec hold x 10, LTR with 2-5 sec hold at end range, pellvic tilts to neutral with PTA hand on small of back for biofeedback, and bridges with BLE on physioball x 10. Pt required cues for controlled movement as well as deep breathing with all activities. PTA performed long axis distraction  on BLE for pain management. Once completed pt indicated pain 0-1/10. [facility] then [facility] feet with no increase in pain. Pt left in bed at end of session with bed alarm on, call bell within reach and needs met.      Therapy Documentation Precautions:  Precautions Precautions: Fall, Other (comment) Precaution Comments: monitor BP, HR, and SpO2; possible L shoulder RC injury ? Restrictions Weight Bearing Restrictions: No General:   Vital Signs: Therapy Vitals Temp: 97.9 F (36.6 C) Temp Source: Oral Pulse Rate: 80 Resp: 18 BP: (!) 144/66 Patient Position (if appropriate): Lying Oxygen Therapy SpO2: 98 % O2 Device: Room Air O2 Flow Rate (L/min): 2 L/min Pain: Pain Assessment Pain Score: 1  Mobility:   Locomotion :    Trunk/Postural Assessment :    Balance:   Exercises:   Other Treatments:      Therapy/Group: Individual Therapy  Leon Lyons 09/29/2021, 4:09 PM

## 2021-09-30 DIAGNOSIS — C61 Malignant neoplasm of prostate: Secondary | ICD-10-CM | POA: Diagnosis not present

## 2021-09-30 DIAGNOSIS — R5381 Other malaise: Secondary | ICD-10-CM | POA: Diagnosis not present

## 2021-09-30 DIAGNOSIS — R600 Localized edema: Secondary | ICD-10-CM | POA: Diagnosis not present

## 2021-09-30 DIAGNOSIS — M25512 Pain in left shoulder: Secondary | ICD-10-CM | POA: Diagnosis not present

## 2021-09-30 LAB — CBC
HCT: 29.6 % — ABNORMAL LOW (ref 39.0–52.0)
Hemoglobin: 8.9 g/dL — ABNORMAL LOW (ref 13.0–17.0)
MCH: 30.5 pg (ref 26.0–34.0)
MCHC: 30.1 g/dL (ref 30.0–36.0)
MCV: 101.4 fL — ABNORMAL HIGH (ref 80.0–100.0)
Platelets: 272 10*3/uL (ref 150–400)
RBC: 2.92 MIL/uL — ABNORMAL LOW (ref 4.22–5.81)
RDW: 17.3 % — ABNORMAL HIGH (ref 11.5–15.5)
WBC: 5.4 10*3/uL (ref 4.0–10.5)
nRBC: 0 % (ref 0.0–0.2)

## 2021-09-30 MED ORDER — LEVALBUTEROL HCL 0.63 MG/3ML IN NEBU
0.6300 mg | INHALATION_SOLUTION | Freq: Two times a day (BID) | RESPIRATORY_TRACT | Status: DC
  Administered 2021-09-30 – 2021-10-02 (×4): 0.63 mg via RESPIRATORY_TRACT
  Filled 2021-09-30 (×6): qty 3

## 2021-09-30 MED ORDER — MUSCLE RUB 10-15 % EX CREA
TOPICAL_CREAM | Freq: Two times a day (BID) | CUTANEOUS | Status: DC
  Administered 2021-10-06 – 2021-10-07 (×2): 1 via TOPICAL
  Filled 2021-09-30: qty 85

## 2021-09-30 NOTE — Progress Notes (Signed)
PROGRESS NOTE   Subjective/Complaints:  Pt feels that breathing is better. Seems to be more easily able to cough up secretions. Doesn't feel lidocaine patches are helping much with his shoulder pain anymore  ROS: Patient denies fever, rash, sore throat, blurred vision, nausea, vomiting, diarrhea, cough, shortness of breath or chest pain, joint or back pain, headache, or mood change.    Objective:   No results found. Recent Labs    09/30/21 0450  WBC 5.4  HGB 8.9*  HCT 29.6*  PLT 272    No results for input(s): NA, K, CL, CO2, GLUCOSE, BUN, CREATININE, CALCIUM in the last 72 hours.   Intake/Output Summary (Last 24 hours) at 09/30/2021 1054 Last data filed at 09/30/2021 0616 Gross per 24 hour  Intake 440 ml  Output 2660 ml  Net -2220 ml        Physical Exam: Vital Signs Blood pressure 132/65, pulse 81, temperature 97.6 F (36.4 C), temperature source Oral, resp. rate 18, height 5\' 10"  (1.778 m), weight 74 kg, SpO2 97 %.    Constitutional: No distress . Vital signs reviewed. HEENT: NCAT, EOMI, oral membranes moist Neck: supple Cardiovascular: RRR without murmur. No JVD    Respiratory/Chest: CTA Bilaterally without wheezes or rales. Normal effort    GI/Abdomen: BS +, non-tender, non-distended Ext: no clubbing, cyanosis, or edema Psych: pleasant and cooperative  Musculoskeletal:     low back pain. Pain left scapula.    left scapular tenderness. LE edema much better, trace at left elbow Neurological:     Comments: Intact to light touch in all 4 extremities B/L  CN's intact on exam. Good insight and awareness. Ox3- appropriate   RUE- 5-/5 in deltoid, biceps, triceps, WE, grip and FA LUE- deltoid 2-/5; weak vs pain; Biceps 4-/5; Triceps 3-/5; WE 4-/5 grip 4-/5; FA 4-/5 RLE- 4+/5 in HF, KE, KF DF and PF LLE- Proximal 3to 3+5; distal 4-/5--stable appearance Skin:    General: Skin is warm and dry.     groin  and abdominal wounds are CDI       Assessment/Plan: 1. Functional deficits which require 3+ hours per day of interdisciplinary therapy in a comprehensive inpatient rehab setting. Physiatrist is providing close team supervision and 24 hour management of active medical problems listed below. Physiatrist and rehab team continue to assess barriers to discharge/monitor patient progress toward functional and medical goals  Care Tool:  Bathing    Body parts bathed by patient: Right arm, Left arm, Chest, Face   Body parts bathed by helper: Abdomen, Front perineal area, Buttocks, Right upper leg, Left upper leg, Right lower leg, Left lower leg     Bathing assist Assist Level: Moderate Assistance - Patient 50 - 74%     Upper Body Dressing/Undressing Upper body dressing   What is the patient wearing?: Pull over shirt    Upper body assist Assist Level: Maximal Assistance - Patient 25 - 49%    Lower Body Dressing/Undressing Lower body dressing      What is the patient wearing?: Pants     Lower body assist Assist for lower body dressing: Maximal Assistance - Patient 25 - 49%  Toileting Toileting    Toileting assist Assist for toileting: Total Assistance - Patient < 25% (required urinal)     Transfers Chair/bed transfer  Transfers assist  Chair/bed transfer activity did not occur: Refused  Chair/bed transfer assist level: Contact Guard/Touching assist Chair/bed transfer assistive device: Programmer, multimedia   Ambulation assist   Ambulation activity did not occur: Safety/medical concerns  Assist level: 2 helpers Assistive device: Walker-rolling Max distance: 50'   Walk 10 feet activity   Assist  Walk 10 feet activity did not occur: Safety/medical concerns  Assist level: 2 helpers Assistive device: Walker-rolling   Walk 50 feet activity   Assist Walk 50 feet with 2 turns activity did not occur: Safety/medical concerns  Assist level: 2  helpers Assistive device: Walker-rolling    Walk 150 feet activity   Assist Walk 150 feet activity did not occur: Safety/medical concerns         Walk 10 feet on uneven surface  activity   Assist Walk 10 feet on uneven surfaces activity did not occur: Safety/medical concerns         Wheelchair     Assist Is the patient using a wheelchair?: Yes Type of Wheelchair: Manual    Wheelchair assist level: Supervision/Verbal cueing Max wheelchair distance: 40    Wheelchair 50 feet with 2 turns activity    Assist        Assist Level: Dependent - Patient 0%   Wheelchair 150 feet activity     Assist      Assist Level: Dependent - Patient 0%   Blood pressure 132/65, pulse 81, temperature 97.6 F (36.4 C), temperature source Oral, resp. rate 18, height 5\' 10"  (1.778 m), weight 74 kg, SpO2 97 %.  Assessment/Plan: Functional deficits secondary  to Debility from MSSA bactermia/PE/endovascular repair of aortic leak; PE and Previous styage IIIB lung CA with new hepatic and bone metastases.  ELOS-  10/11/21 Pt may shower -Continue CIR therapies including PT, OT   2. DVT/R lieofemoral DVT- Lovenox 70 mg BID- until CT in 4 weeks by Vascular with potential switch to oral 3. Pain Management: Tylenola nd Percocet as needed for pain- for LUE/LLE and back pain .   12/12 prn percocet,  Restarted Gabapentin 300 mg QHS-   12/23 continue scheduled percocet in am and kpad   -will dc lidocaine patch and aspercreme over shoulder   -continue flector for back 4. Mood:-  Palliative care following due to his cancer and ego support as required-  ?neuropsychology    5. Skin/Wound Care: encouraging nutrition  -wounds healing 6. . Fluids/Electrolytes/Nutrition: encourage PO -pt is drinking protein supps and eating fairly well -weights stable last week, recheck tomorrow  7. MSSA bacteremia- is on Cefazolin q8 hours for 4 weeks from 09/09/21 -thoracentesis cultures are negative.  Fungal cx with gs still pending  8. Persistent Afib- is recent diagnosis- Metoprolol just increased to 75 mg BID and on Diltiazem 120 mg daily- monitor for hypotension and hold meds if BP <100.    Vitals:   09/30/21 0424 09/30/21 0809  BP: 132/65   Pulse: 81   Resp: 18   Temp: 97.6 F (36.4 C)   SpO2: 98% 97%    12/23 stable 9. S/p aortic leak/endovascular repair- periaortic hematoma- vascular to reorder at CT in 4 weeks to follow up 10. Previous Stage IIIB Lung CA- with new hepatic and bone mets- likely Stage IV-  -palliative care follow up appreciated -pt is DNR - F/U with  Oncology and maybe hospice after d/c.  12/23 intermittent DOE, seems better  -cxr without significant change  -oxygen  -xopenex and pulmicort scheduled HHN have helped  -decrease xopenex to bid along with pulmicort 11. Constipation-    12/19 had large bm 12/18 12. PICC line for IV ABX- as of 09/17/21 13. LE Edema--improved with ACE wraps and elevation, nutrition             LOS: 13 days A FACE TO FACE EVALUATION WAS PERFORMED  Meredith Staggers 09/30/2021, 10:54 AM

## 2021-09-30 NOTE — Progress Notes (Signed)
Occupational Therapy Note  Patient Details  Name: Leon Lyons MRN: [id number] Date of Birth: [birth year]  Today's Date: 09/30/2021 OT Missed Time: 59 Minutes Missed Time Reason: Patient fatigue;Pain  Patient greeted supine in bed with 2L of O2 on. Pt reported being in a lot of pain in his back and his Left side. Pt stated he had just finished with earlier OT session and did not think he could participate at this time due to pain and fatigue. OT to follow up per plan of care as time allows.   Daneen Schick Ericha Whittingham 09/30/2021, 1:21 PM

## 2021-09-30 NOTE — Progress Notes (Signed)
Occupational Therapy Session Note  Patient Details  Name: Leon Lyons MRN: [id number] Date of Birth: [birth year]  Today's Date: 09/30/2021 OT Individual Time: 5027-7412 OT Individual Time Calculation (min): 75 min    Short Term Goals: Week 2:  OT Short Term Goal 1 (Week 2): PT will be able to thread LB clothing with min A with AE prn OT Short Term Goal 2 (Week 2): Pt will consistently stand with mod A at the sink in preparation for BADL tasks OT Short Term Goal 3 (Week 2): Patient will demonstrate R elbow ROM for edema management with min cues OT Short Term Goal 4 (Week 2): Patient will complete 1 step of toileting task  Skilled Therapeutic Interventions/Progress Updates:  Pt greeted supine in bed with RT finishing up breathing treatment. pt agreeable to OT intervention. Session focus on BADL reeducation, functional mobility, dynamic standing balance and decreasing overall burden of care.     Pt completed supine>sit with light MIN a to elevate trunk into sitting.  Pt completed sit<>stand with CGA but needed x2 attempts to come into full stand from elevated EOB. Pt ambulated to sink with rw and CGA but needed to sit immediately once at sink d/t back pain. Pt completed wash up at sink with overall set- up assist only wanting to wash face and complete oral care. Pt used reacher to don pants needing increased time and MOD cues for sequencing but able to don with overall MIN A as pt needed assist to pull pants up to waist line in standing ( pt has reacher at home). Pt needed MIN A to stand from low w/c and x2 attempts to stand as pt continues to present with posterior lean when trying to stand. S/u to don button up shirt, MIN cues to sequence correct button sequence. Pt on 2L [place] with SpO2 >92% during ADLs. Pt was able to stand and urinate for the first time today! MIN A to stand while wife held urinal and MOD A for clothing mgmt in standing. Pt completed x2 additional stands to work on  LB  strength and endurance. Pt completed x2 reps with overall MIN A with heavy emphasis on anterior weight shift. Worked on PLB technique with activity of blowing through straw to blow cotton balls across table to provide pt with visual technique to assist with increased carryover. pt left seated in w/c with all needs within reach and wife present.                      Therapy Documentation Precautions:  Precautions Precautions: Fall, Other (comment) Precaution Comments: monitor BP, HR, and SpO2; possible L shoulder RC injury ? Restrictions Weight Bearing Restrictions: No  Pain: unrated pain reported in LUE and back, provided rest breaks and repositioning as needed.    Therapy/Group: Individual Therapy  Corinne Ports [facility] 09/30/2021, 10:30 AM

## 2021-09-30 NOTE — Progress Notes (Signed)
Physical Therapy Session Note  Patient Details  Name: Leon Lyons MRN: [id number] Date of Birth: 10-Feb-1933  Today's Date: 09/30/2021 PT Individual Time: 1130-1200 and 1410-1515 PT Individual Time Calculation (min): 30 min and 65 min  Short Term Goals: Week 2:  PT Short Term Goal 1 (Week 2): Pt will perform bed mobility consistently with minA. PT Short Term Goal 2 (Week 2): Pt will perform sit to stand consistently with minA. PT Short Term Goal 3 (Week 2): Pt will perform bed to chair transfer consistently with minA. PT Short Term Goal 4 (Week 2): Pt will ambulate x150' with minA and LRAD.  Skilled Therapeutic Interventions/Progress Updates: Tx1: Pt presented in bed agreeable to therapy. Session focused on ambulation for increased endurance. Pt performed supine to sit with CGA and use of bed feautres. PTA donned shoes total A for time management. Pt performed Sit to stand from elevated bed and ambulated ~179f with RW and w/c follow.. SpO2>90% after ambulation but pt required increased time for recovery. Pt then ambulated additional ~1481fwith RW and CGA with SpO2 initially reading 80% but poor waveform and near immediate bump to 92%. Then then propelled w/c ~14076fith BLE back towards room and transported back remaining dstance. Pt agreeable to remain in w/c as lunchtime. Pt left in w/c with wife present and current needs met.    Tx2: Pt presented in bed with wife Leon Lyons present agreeable to therapy. Pt states increased pain in low back and recently premedicated. Pt ageeable to supine therex then progress to OOB. [facility]. PTA also performed hamstring and calf stretch 30sec x 3 bilaterally. Pt indicated some decreased pain in back after activities. Performed supine to sit with CGA and use of bed features. Performed stand pivot to w/c with RW and minA for Sit to stand and CGA for steps to w/c. Pt transported to rehab gym and performed stand pivot in  same manner as prior however pt was able to demonstrate improved anterior weight shifting when pushing from w/c. Pt transferred to mat and attempted to perform Sit to stand and reach for card on board to encourage weight shift. Pt was able to perform x 2 however required modA and increased dyspnea noted. SpO2 checked 90% on 2L increased to 96%. Pt indicated significant fatigue. Performed Sit to stand to return to chair requiring modA as pt unable to power up. Pt transported back to room and required modA to stand from w/c. At EOB pt required minA for sit to supine for BLE management. Pt repositioned to comfort and left with bed alarm on, call bell within reach and needs met.      Therapy Documentation Precautions:  Precautions Precautions: Fall, Other (comment) Precaution Comments: monitor BP, HR, and SpO2; possible L shoulder RC injury ? Restrictions Weight Bearing Restrictions: No General:   Vital Signs: Therapy Vitals Temp: 98 F (36.7 C) Temp Source: Oral Pulse Rate: 83 Resp: 18 BP: (!) 142/63 Patient Position (if appropriate): Lying Oxygen Therapy SpO2: 99 % O2 Device: Nasal Cannula O2 Flow Rate (L/min): 2 L/min Pain: Pain Assessment Pain Scale: 0-10 Pain Score: 2  Faces Pain Scale: Hurts a little bit PAINAD (Pain Assessment in Advanced Dementia) Breathing: normal Negative Vocalization: none Facial Expression: smiling or inexpressive Body Language: tense, distressed pacing, fidgeting Consolability: no need to console PAINAD Score: 1 Critical Care Pain Observation Tool (CPOT) Facial Expression: Tense Body Movements: Absence of movements Muscle Tension: Tense, rigid Mobility:  Locomotion :    Trunk/Postural Assessment :    Balance:   Exercises:   Other Treatments:      Therapy/Group: Individual Therapy  Emmanuell Kantz 09/30/2021, 4:10 PM

## 2021-09-30 NOTE — Progress Notes (Signed)
Call from attendant RN that patient is more SOB this afternoon. SaO2 is 97% on 2L. Reports phlegm, given Robitussin.  Advised to capture sputum for culture if productive. As per Dr. Naaman Plummer note this morning cxr is stable. Continue scheduled HHN as ordered.

## 2021-10-01 DIAGNOSIS — R5381 Other malaise: Secondary | ICD-10-CM | POA: Diagnosis not present

## 2021-10-01 DIAGNOSIS — C61 Malignant neoplasm of prostate: Secondary | ICD-10-CM | POA: Diagnosis not present

## 2021-10-01 DIAGNOSIS — M25512 Pain in left shoulder: Secondary | ICD-10-CM | POA: Diagnosis not present

## 2021-10-01 DIAGNOSIS — R600 Localized edema: Secondary | ICD-10-CM | POA: Diagnosis not present

## 2021-10-01 MED ORDER — CHLORHEXIDINE GLUCONATE CLOTH 2 % EX PADS
6.0000 | MEDICATED_PAD | Freq: Two times a day (BID) | CUTANEOUS | Status: DC
  Administered 2021-10-01 – 2021-10-11 (×19): 6 via TOPICAL

## 2021-10-01 MED ORDER — SORBITOL 70 % SOLN
60.0000 mL | Status: AC
  Administered 2021-10-01: 13:00:00 60 mL via ORAL
  Filled 2021-10-01: qty 60

## 2021-10-01 MED ORDER — DM-GUAIFENESIN ER 30-600 MG PO TB12
1.0000 | ORAL_TABLET | Freq: Two times a day (BID) | ORAL | Status: DC
  Administered 2021-10-01 – 2021-10-11 (×21): 1 via ORAL
  Filled 2021-10-01: qty 1
  Filled 2021-10-01: qty 2
  Filled 2021-10-01 (×19): qty 1

## 2021-10-01 MED ORDER — GUAIFENESIN-DM 100-10 MG/5ML PO SYRP: 5.0000 mL | ORAL_SOLUTION | Freq: Three times a day (TID) | ORAL | Status: DC | PRN

## 2021-10-01 NOTE — Progress Notes (Signed)
Occupational Therapy Session Note  Patient Details  Name: Leon Lyons MRN: [id number] Date of Birth: 08-01-1933  Today's Date: 10/01/2021 OT Individual Time: 9678-9381 OT Individual Time Calculation (min): 45 min   Short Term Goals: Week 2:  OT Short Term Goal 1 (Week 2): PT will be able to thread LB clothing with min A with AE prn OT Short Term Goal 2 (Week 2): Pt will consistently stand with mod A at the sink in preparation for BADL tasks OT Short Term Goal 3 (Week 2): Patient will demonstrate R elbow ROM for edema management with min cues OT Short Term Goal 4 (Week 2): Patient will complete 1 step of toileting task  Skilled Therapeutic Interventions/Progress Updates:    Pt greeted semi-reclined in bed on 2L of O2. Pt maintained on 2L throughout therapy session. Pt came to sitting EOB with mod A to elevate trunk. Seated rest break at EOB, then worked on threading button up shirt. OT had to start shirt button, then he was able to finish with increased time. Pt needed 2 trials for sit<>stand with mod A, RW, and tactile cues for anterior weight shift. Pt then able to pivot to wc with RW and min A. Grooming tasks completed seated in wc at the sink with focus on core strengthening to lean forward and reach objects at the sink. SpO2 95% after activity on 2L. Pt brought to therapy gym and worked on anterior weight shift with sit<>stands and activity tolerance with 2 sets of 5 consecutive sit<>stands ranging from min-mod A pending fatigue. Extended rest break in between sets. Pt returned to room and left seated in wc with spouse present, call bell in reach, and needs met.  Therapy Documentation Precautions:  Precautions Precautions: Fall, Other (comment) Precaution Comments: monitor BP, HR, and SpO2; possible L shoulder RC injury ? Restrictions Weight Bearing Restrictions: No Vital Signs: Oxygen Therapy SpO2: 96 % O2 Device: Nasal Cannula O2 Flow Rate (L/min): 2 L/min Pain: Pt reports  back and L shoulder pain. No number given, but premedicated for therapy.   Therapy/Group: Individual Therapy  Valma Cava 10/01/2021, 11:08 AM

## 2021-10-01 NOTE — Progress Notes (Signed)
PROGRESS NOTE   Subjective/Complaints:  Pt had some sob reported yesterday afternoon. Overall he tells me that he's breathing better and able to cough up more phlegm  ROS: Patient denies fever, rash, sore throat, blurred vision, nausea, vomiting, diarrhea, chest pain,  headache, or mood change.    Objective:   No results found. Recent Labs    09/30/21 0450  WBC 5.4  HGB 8.9*  HCT 29.6*  PLT 272    No results for input(s): NA, K, CL, CO2, GLUCOSE, BUN, CREATININE, CALCIUM in the last 72 hours.   Intake/Output Summary (Last 24 hours) at 10/01/2021 1223 Last data filed at 10/01/2021 0918 Gross per 24 hour  Intake 714 ml  Output 2565 ml  Net -1851 ml        Physical Exam: Vital Signs Blood pressure (!) 123/55, pulse 84, temperature 97.7 F (36.5 C), temperature source Oral, resp. rate 18, height 5\' 10"  (1.778 m), weight 74 kg, SpO2 96 %.    Constitutional: No distress . Vital signs reviewed. HEENT: NCAT, EOMI, oral membranes moist Neck: supple Cardiovascular: RRR without murmur. No JVD    Respiratory/Chest: scattered rhonchi, basilar sounds, asynchronous respirations GI/Abdomen: BS +, non-tender, non-distended Ext: no clubbing, cyanosis  Psych: pleasant and cooperative  Musculoskeletal:     low back pain. Pain left scapula.    left scapular tenderness. LE edema improvedr, trace at left elbow Neurological:     Comments: Intact to light touch in all 4 extremities B/L  CN's intact on exam. Good insight and awareness. Ox3- appropriate   RUE- 5-/5 in deltoid, biceps, triceps, WE, grip and FA LUE- deltoid 2-/5; weak vs pain; Biceps 4-/5; Triceps 3-/5; WE 4-/5 grip 4-/5; FA 4-/5 RLE- 4+/5 in HF, KE, KF DF and PF LLE- Proximal 3to 3+5; distal 4-/5--stable appearance Skin:    General: Skin is warm and dry.     groin and abdominal wounds are CDI       Assessment/Plan: 1. Functional deficits which require 3+  hours per day of interdisciplinary therapy in a comprehensive inpatient rehab setting. Physiatrist is providing close team supervision and 24 hour management of active medical problems listed below. Physiatrist and rehab team continue to assess barriers to discharge/monitor patient progress toward functional and medical goals  Care Tool:  Bathing    Body parts bathed by patient: Right arm, Left arm, Chest, Face   Body parts bathed by helper: Abdomen, Front perineal area, Buttocks, Right upper leg, Left upper leg, Right lower leg, Left lower leg     Bathing assist Assist Level: Moderate Assistance - Patient 50 - 74%     Upper Body Dressing/Undressing Upper body dressing   What is the patient wearing?: Pull over shirt    Upper body assist Assist Level: Maximal Assistance - Patient 25 - 49%    Lower Body Dressing/Undressing Lower body dressing      What is the patient wearing?: Pants     Lower body assist Assist for lower body dressing: Maximal Assistance - Patient 25 - 49%     Toileting Toileting    Toileting assist Assist for toileting: Total Assistance - Patient < 25% (required urinal)  Transfers Chair/bed transfer  Transfers assist  Chair/bed transfer activity did not occur: Refused  Chair/bed transfer assist level: Contact Guard/Touching assist Chair/bed transfer assistive device: Programmer, multimedia   Ambulation assist   Ambulation activity did not occur: Safety/medical concerns  Assist level: 2 helpers Assistive device: Walker-rolling Max distance: 50'   Walk 10 feet activity   Assist  Walk 10 feet activity did not occur: Safety/medical concerns  Assist level: 2 helpers Assistive device: Walker-rolling   Walk 50 feet activity   Assist Walk 50 feet with 2 turns activity did not occur: Safety/medical concerns  Assist level: 2 helpers Assistive device: Walker-rolling    Walk 150 feet activity   Assist Walk 150 feet  activity did not occur: Safety/medical concerns         Walk 10 feet on uneven surface  activity   Assist Walk 10 feet on uneven surfaces activity did not occur: Safety/medical concerns         Wheelchair     Assist Is the patient using a wheelchair?: Yes Type of Wheelchair: Manual    Wheelchair assist level: Supervision/Verbal cueing Max wheelchair distance: 40    Wheelchair 50 feet with 2 turns activity    Assist        Assist Level: Dependent - Patient 0%   Wheelchair 150 feet activity     Assist      Assist Level: Dependent - Patient 0%   Blood pressure (!) 123/55, pulse 84, temperature 97.7 F (36.5 C), temperature source Oral, resp. rate 18, height 5\' 10"  (1.778 m), weight 74 kg, SpO2 96 %.  Assessment/Plan: Functional deficits secondary  to Debility from MSSA bactermia/PE/endovascular repair of aortic leak; PE and Previous styage IIIB lung CA with new hepatic and bone metastases.  ELOS-  10/11/21 Pt may shower -Continue CIR therapies including PT, OT, and SLP  2. DVT/R lieofemoral DVT- Lovenox 70 mg BID- until CT in 4 weeks by Vascular with potential switch to oral 3. Pain Management: Tylenola nd Percocet as needed for pain- for LUE/LLE and back pain .   12/12 prn percocet,  Restarted Gabapentin 300 mg QHS-   12/24 continue scheduled percocet in am and kpad   -dc'ed lidocaine patch and are trying aspercreme over L shoulder   -continue flector for back 4. Mood:-  Palliative care following due to his cancer and ego support as required-  ?neuropsychology    5. Skin/Wound Care: encouraging nutrition  -wounds healing 6. . Fluids/Electrolytes/Nutrition: encourage PO -pt is drinking protein supps and eating fairly well -weights stable last week, recheck tomorrow  7. MSSA bacteremia- is on Cefazolin q8 hours for 4 weeks from 09/09/21 -thoracentesis cultures are negative. Fungal cx with gs still pending  8. Persistent Afib- is recent diagnosis-  Metoprolol just increased to 75 mg BID and on Diltiazem 120 mg daily- monitor for hypotension and hold meds if BP <100.    Vitals:   10/01/21 0837 10/01/21 0839  BP:    Pulse:    Resp:    Temp:    SpO2: 94% 96%    12/23 stable 9. S/p aortic leak/endovascular repair- periaortic hematoma- vascular to reorder at CT in 4 weeks to follow up 10. Previous Stage IIIB Lung CA- with new hepatic and bone mets- likely Stage IV-  -palliative care follow up appreciated -pt is DNR - F/U with Oncology and maybe hospice after d/c.  12/24 intermittent DOE, seems better  -cxr without significant change  -oxygen  -xopenex  and pulmicort scheduled HHN have helped  -decrease xopenex to bid along with pulmicort  -add scheduled mucinex dm to help him mobilize secretions 11. Constipation-    Last bm 12/21--->sorbitol today 12/24 12. PICC line for IV ABX- as of 09/17/21 13. LE Edema--improved with ACE wraps and elevation, nutrition             LOS: 14 days A FACE TO FACE EVALUATION WAS PERFORMED  Meredith Staggers 10/01/2021, 12:23 PM

## 2021-10-02 MED ORDER — LEVALBUTEROL HCL 0.63 MG/3ML IN NEBU
0.6300 mg | INHALATION_SOLUTION | Freq: Four times a day (QID) | RESPIRATORY_TRACT | Status: DC
Start: 2021-10-03 — End: 2021-10-05
  Administered 2021-10-03 – 2021-10-04 (×8): 0.63 mg via RESPIRATORY_TRACT
  Filled 2021-10-02 (×11): qty 3

## 2021-10-02 MED ORDER — LEVALBUTEROL HCL 0.63 MG/3ML IN NEBU
0.6300 mg | INHALATION_SOLUTION | RESPIRATORY_TRACT | Status: DC | PRN
  Filled 2021-10-02: qty 3

## 2021-10-02 NOTE — Progress Notes (Signed)
Pt and pts wife refused ace wraps and scds for the night. Pts legs were elevated with pillows.

## 2021-10-02 NOTE — Progress Notes (Signed)
Physical Therapy Session Note  Patient Details  Name: Leon Lyons MRN: [id number] Date of Birth: November 26, 1932  Today's Date: 10/03/2021 PT Individual Time: 1440-1535 PT Individual Time Calculation (min): 55 min   Short Term Goals: Week 2:  PT Short Term Goal 1 (Week 2): Pt will perform bed mobility consistently with minA. PT Short Term Goal 2 (Week 2): Pt will perform sit to stand consistently with minA. PT Short Term Goal 3 (Week 2): Pt will perform bed to chair transfer consistently with minA. PT Short Term Goal 4 (Week 2): Pt will ambulate x150' with minA and LRAD.  Skilled Therapeutic Interventions/Progress Updates:     Patient in bed with his wife at bedside upon PT arrival. Patient alert and agreeable to PT session. Patient reported 6/10 back pain during session, RN made aware. PT provided repositioning, rest breaks, and distraction as pain interventions throughout session.   Focused on sit to stand transfers and motor planning for forward weight shift with transfers.  Therapeutic Activity: Bed Mobility: Patient performed supine to/from sit with min A for trunk control in a flat bed without use of bed rail to the R to simulate home set-up. Provided verbal cues for progression through side-lying and use of bottom elbow to push up. Transfers: Patient performed sit to/from stand x6 with min-mod A and CGA x1 with bed slightly elevated. Provided verbal cues for forward weight shift and increased quad and glute activation for boosting up.  Neuromuscular Re-ed: Patient performed the following motor control activities: -seated diaphragmatic breathing with vocalization for improved pacing with breath and diaphragm activation 4x1 min -forward rocking to partial stand x3 with min-mod A with hands on therapist's shoulders, patient with increased fear of falling with activity and activity terminated -forward trunk lean until weight shift onto feet x4 with hands on RW and bed set-up for  stand  Patient required increased time and rest breaks, x1 lying rest break, due to decreased activity tolerance. Educated on coping strategies for sudden functional and health decline over th past year and energy conservation strategies during rest breaks.   Patient in bed with his wife at bedside at end of session with breaks locked, bed alarm set, and all needs within reach.   Therapy Documentation Precautions:  Precautions Precautions: Fall, Other (comment) Precaution Comments: monitor BP, HR, and SpO2; possible L shoulder RC injury ? Restrictions Weight Bearing Restrictions: No    Therapy/Group: Individual Therapy  Albion Weatherholtz L Jahmad Petrich PT, DPT  10/01/2021, 4:05 PM

## 2021-10-03 ENCOUNTER — Inpatient Hospital Stay (HOSPITAL_COMMUNITY): Payer: Medicare Other

## 2021-10-03 ENCOUNTER — Encounter (HOSPITAL_COMMUNITY): Payer: Self-pay | Admitting: Physical Medicine & Rehabilitation

## 2021-10-03 DIAGNOSIS — I4819 Other persistent atrial fibrillation: Secondary | ICD-10-CM

## 2021-10-03 DIAGNOSIS — Z86711 Personal history of pulmonary embolism: Secondary | ICD-10-CM

## 2021-10-03 DIAGNOSIS — R5381 Other malaise: Secondary | ICD-10-CM | POA: Diagnosis not present

## 2021-10-03 LAB — BASIC METABOLIC PANEL
Anion gap: 8 (ref 5–15)
BUN: 12 mg/dL (ref 8–23)
CO2: 36 mmol/L — ABNORMAL HIGH (ref 22–32)
Calcium: 8.5 mg/dL — ABNORMAL LOW (ref 8.9–10.3)
Chloride: 92 mmol/L — ABNORMAL LOW (ref 98–111)
Creatinine, Ser: 0.55 mg/dL — ABNORMAL LOW (ref 0.61–1.24)
GFR, Estimated: >60 mL/min (ref >60)
Glucose, Bld: 92 mg/dL (ref 70–99)
Potassium: 2.7 mmol/L — CL (ref 3.5–5.1)
Sodium: 136 mmol/L (ref 135–145)

## 2021-10-03 LAB — CBC
HCT: 29.6 % — ABNORMAL LOW (ref 39.0–52.0)
Hemoglobin: 9.1 g/dL — ABNORMAL LOW (ref 13.0–17.0)
MCH: 30.6 pg (ref 26.0–34.0)
MCHC: 30.7 g/dL (ref 30.0–36.0)
MCV: 99.7 fL (ref 80.0–100.0)
Platelets: 259 10*3/uL (ref 150–400)
RBC: 2.97 MIL/uL — ABNORMAL LOW (ref 4.22–5.81)
RDW: 17.1 % — ABNORMAL HIGH (ref 11.5–15.5)
WBC: 6 10*3/uL (ref 4.0–10.5)
nRBC: 0 % (ref 0.0–0.2)

## 2021-10-03 MED ORDER — POTASSIUM CHLORIDE 20 MEQ PO PACK: 40.0000 meq | PACK | Freq: Once | ORAL | Status: DC

## 2021-10-03 MED ORDER — POTASSIUM CHLORIDE 20 MEQ PO PACK
40.0000 meq | PACK | Freq: Once | ORAL | Status: AC
  Administered 2021-10-03: 06:00:00 40 meq via ORAL
  Filled 2021-10-03: qty 2

## 2021-10-03 MED ORDER — POTASSIUM CHLORIDE 20 MEQ PO PACK
40.0000 meq | PACK | Freq: Once | ORAL | Status: AC
  Administered 2021-10-03: 12:00:00 40 meq via ORAL
  Filled 2021-10-03: qty 2

## 2021-10-03 MED ORDER — POTASSIUM CHLORIDE CRYS ER 20 MEQ PO TBCR: 40.0000 meq | EXTENDED_RELEASE_TABLET | Freq: Once | ORAL | Status: DC

## 2021-10-03 NOTE — Consult Note (Incomplete)
CARDIOLOGY CONSULT NOTE  Patient ID: Leon Lyons MRN: [id number] DOB/AGE: Nov 01, 1932 87 y.o.  Admit date: 09/17/2021 Referring Physician: Rehab Reason for Consultation:  Afib  HPI:   87 y.o. *** male  with ***  Past Medical History:  Diagnosis Date   History of radiation therapy 03/24/2021   left lung   02/11/2021-03/24/2021  Dr Gery Pray   Lung cancer [facility])    Tobacco use      Past Surgical History:  Procedure Laterality Date   ABDOMINAL AORTIC ENDOVASCULAR STENT GRAFT Bilateral 09/11/2021   Procedure: EVAR;  Surgeon: Angelia Mould, MD;  Location: [place];  Service: Vascular;  Laterality: Bilateral;   APPENDECTOMY     BRONCHIAL BIOPSY  01/07/2021   Procedure: BRONCHIAL BIOPSIES;  Surgeon: Garner Nash, DO;  Location: [place] ENDOSCOPY;  Service: Pulmonary;;   BRONCHIAL BRUSHINGS  01/07/2021   Procedure: BRONCHIAL BRUSHINGS;  Surgeon: Garner Nash, DO;  Location: [place] [facility];  Service: Pulmonary;;   BRONCHIAL NEEDLE ASPIRATION BIOPSY  01/07/2021   Procedure: BRONCHIAL NEEDLE ASPIRATION BIOPSIES;  Surgeon: Garner Nash, DO;  Location: [place];  Service: Pulmonary;;   BRONCHIAL WASHINGS  01/07/2021   Procedure: BRONCHIAL WASHINGS;  Surgeon: Garner Nash, DO;  Location: [place];  Service: Pulmonary;;   IR THORACENTESIS ASP PLEURAL SPACE W/IMG GUIDE  09/15/2021   IRRIGATION AND DEBRIDEMENT SEBACEOUS CYST     TEE WITHOUT CARDIOVERSION N/A 09/14/2021   Procedure: TRANSESOPHAGEAL ECHOCARDIOGRAM (TEE);  Surgeon: Sanda Klein, MD;  Location: [place];  Service: Cardiovascular;  Laterality: N/A;   VIDEO BRONCHOSCOPY WITH ENDOBRONCHIAL ULTRASOUND Bilateral 01/07/2021   Procedure: VIDEO BRONCHOSCOPY WITH ENDOBRONCHIAL ULTRASOUND;  Surgeon: Garner Nash, DO;  Location: [place];  Service: Pulmonary;  Laterality: Bilateral;  possible need for radial ultrasound and fluoro      *** Family History  Problem Relation Age of Onset   Hypertension Other       Social History: Social History   Socioeconomic History   Marital status: Married    Spouse name: Not on file   Number of children: Not on file   Years of education: Not on file   Highest education level: Not on file  Occupational History   Not on file  Tobacco Use   Smoking status: Former    Years: 5.00    Types: Cigarettes    Quit date: 65    Years since quitting: 61.0   Smokeless tobacco: Never   Tobacco comments:    light/occasional smoker back in early 1960's  Vaping Use   Vaping Use: Never used  Substance and Sexual Activity   Alcohol use: Not Currently   Drug use: Never   Sexual activity: Not Currently  Other Topics Concern   Not on file  Social History Narrative   Not on file   Social Determinants of Health   Financial Resource Strain: Low Risk    Difficulty of Paying Living Expenses: Not hard at all  Food Insecurity: No Food Insecurity   Worried About Charity fundraiser in the Last Year: Never true   [place] in the Last Year: Never true  Transportation Needs: No Transportation Needs   Lack of Transportation (Medical): No   Lack of Transportation (Non-Medical): No  Physical Activity: Inactive   Days of Exercise per Week: 0 days   Minutes of Exercise per Session: 0 min  Stress: No Stress Concern Present   Feeling of Stress : Only a little  Social Connections:  Not on file  Intimate Partner Violence: Not At Risk   Fear of Current or Ex-Partner: No   Emotionally Abused: No   Physically Abused: No   Sexually Abused: No     Medications Prior to Admission  Medication Sig Dispense Refill Last Dose   acetaminophen (TYLENOL) 500 MG tablet Take 500-1,000 mg by mouth every 8 (eight) hours as needed (for pain).      alum & mag hydroxide-simeth (MAALOX/MYLANTA) 200-200-20 MG/5ML suspension Take 15-30 mLs by mouth every 2 (two) hours as needed for indigestion. 355 mL 0    bisacodyl (DULCOLAX) 10 MG suppository Place 1 suppository (10 mg total) rectally  daily as needed for moderate constipation. 12 suppository 0    ceFAZolin (ANCEF) IVPB Inject 2 g into the vein every 8 (eight) hours for 22 days. Indication:  MSSA bacteremia First Dose: Yes Last Day of Therapy:  10/08/21 Labs - Once weekly:  CBC/D and BMP, Labs - Every other week:  ESR and CRP Method of administration: IV Push Method of administration may be changed at the discretion of home infusion pharmacist based upon assessment of the patient and/or caregiver's ability to self-administer the medication ordered. 66 Units 0    Chlorhexidine Gluconate Cloth 2 % PADS Apply 6 each topically daily.      Cholecalciferol (VITAMIN D3 SUPER STRENGTH) 50 MCG (2000 UT) CAPS Take 4,000 Units by mouth daily.      cyclobenzaprine (FLEXERIL) 5 MG tablet Take 1 tablet (5 mg total) by mouth 3 (three) times daily as needed for muscle spasms. 30 tablet 0    diltiazem (CARDIZEM) 120 MG tablet Take 120 mg by mouth daily.      docusate sodium (COLACE) 100 MG capsule Take 1 capsule (100 mg total) by mouth daily. 10 capsule 0    enoxaparin (LOVENOX) 80 MG/0.8ML injection Inject 70 mg into the skin 2 (two) times daily.      feeding supplement (ENSURE ENLIVE / ENSURE PLUS) LIQD Take 237 mLs by mouth 2 (two) times daily between meals. 086 mL 12    folic acid (FOLVITE) 1 MG tablet Take 1 tablet (1 mg total) by mouth daily.      gabapentin (NEURONTIN) 300 MG capsule Take 300 mg by mouth at bedtime.      guaiFENesin-dextromethorphan (ROBITUSSIN DM) 100-10 MG/5ML syrup Take 5 mLs by mouth every 6 (six) hours as needed for cough. 118 mL 0    metoprolol tartrate (LOPRESSOR) 50 MG tablet Take 1 tablet (50 mg total) by mouth 2 (two) times daily. Hold if systolic pressure <761 or heart rate <65      oxyCODONE-acetaminophen (PERCOCET/ROXICET) 5-325 MG tablet Take 1-2 tablets by mouth every 4 (four) hours as needed for moderate pain. 30 tablet 0    pantoprazole (PROTONIX) 40 MG tablet Take 1 tablet (40 mg total) by mouth daily.       polyethylene glycol (MIRALAX / GLYCOLAX) 17 g packet Take 17 g by mouth daily. 14 each 0    rosuvastatin (CRESTOR) 5 MG tablet Take 1 tablet (5 mg total) by mouth daily.       ROS    Physical Exam: Physical Exam   Labs:   Lab Results  Component Value Date   WBC 6.0 10/03/2021   HGB 9.1 (L) 10/03/2021   HCT 29.6 (L) 10/03/2021   MCV 99.7 10/03/2021   PLT 259 10/03/2021    Recent Labs  Lab 10/03/21 0400  NA 136  K 2.7*  CL 92*  CO2 36*  BUN 12  CREATININE 0.55*  CALCIUM 8.5*  GLUCOSE 92    Lipid Panel  No results found for: CHOL, TRIG, HDL, CHOLHDL, VLDL, LDLCALC  BNP (last 3 results) No results for input(s): BNP in the last 8760 hours.  HEMOGLOBIN A1C No results found for: HGBA1C, MPG  Cardiac Panel (last 3 results) No results for input(s): CKTOTAL, CKMB, RELINDX in the last 8760 hours.  Invalid input(s): TROPONINHS  No results found for: CKTOTAL, CKMB, CKMBINDEX   TSH Recent Labs    05/11/21 1106 06/09/21 1032 07/07/21 1002  TSH 2.018 3.307 3.228      Radiology: No results found.  Scheduled Meds:  budesonide  0.25 mg Nebulization BID   Chlorhexidine Gluconate Cloth  6 each Topical BID   dextromethorphan-guaiFENesin  1 tablet Oral BID   diclofenac  1 patch Transdermal BID   diltiazem  120 mg Oral Daily   enoxaparin (LOVENOX) injection  75 mg Subcutaneous BID   feeding supplement  237 mL Oral BID BM   folic acid  1 mg Oral Daily   gabapentin  300 mg Oral QHS   levalbuterol  0.63 mg Nebulization Q6H   mouth rinse  15 mL Mouth Rinse BID   metoprolol tartrate  50 mg Oral BID   multivitamin with minerals  1 tablet Oral Daily   Muscle Rub   Topical BID   oxyCODONE-acetaminophen  1 tablet Oral Q0600   pantoprazole  40 mg Oral Daily   rosuvastatin  5 mg Oral Daily   senna-docusate  2 tablet Oral QHS   sodium chloride flush  10-40 mL Intracatheter Q12H   sodium phosphate  1 enema Rectal Once   Continuous Infusions:  sodium chloride 10  mL/hr at 10/02/21 1509    ceFAZolin (ANCEF) IV 2 g (10/03/21 0548)   PRN Meds:.sodium chloride, acetaminophen **OR** acetaminophen, alum & mag hydroxide-simeth, bisacodyl, cyclobenzaprine, guaiFENesin-dextromethorphan, heparin lock flush, levalbuterol, ondansetron **OR** ondansetron (ZOFRAN) IV, oxyCODONE-acetaminophen, phenol, polyethylene glycol, sodium chloride, sodium chloride flush, sodium phosphate  CARDIAC STUDIES:  EKG ***: ***  Echocardiogram ***:  ***  ***  Assessment & Recommendations:  87 y.o. *** male  with ***     Nigel Mormon, MD Pager: [phone] Office: [phone]

## 2021-10-03 NOTE — Progress Notes (Signed)
PROGRESS NOTE   Subjective/Complaints: Continues to have some shortness of breath and appears to have some belly breathing while sleeping, interactive and can communicate well when awake but falls back to sleep easily.   ROS: Patient denies fever, rash, sore throat, blurred vision, nausea, vomiting, diarrhea, chest pain,  headache, or mood change. +shortness of breath   Objective:   No results found. Recent Labs    10/03/21 0400  WBC 6.0  HGB 9.1*  HCT 29.6*  PLT 259    Recent Labs    10/03/21 0400  NA 136  K 2.7*  CL 92*  CO2 36*  GLUCOSE 92  BUN 12  CREATININE 0.55*  CALCIUM 8.5*     Intake/Output Summary (Last 24 hours) at 10/03/2021 1147 Last data filed at 10/03/2021 0900 Gross per 24 hour  Intake 580 ml  Output 1175 ml  Net -595 ml        Physical Exam: Vital Signs Blood pressure 121/60, pulse 90, temperature 97.7 F (36.5 C), resp. rate 16, height 5\' 10"  (1.778 m), weight 74 kg, SpO2 96 %.  Constitutional: No distress . Vital signs reviewed. Falls in and out of sleep easily but interactive while awake HEENT: NCAT, EOMI, oral membranes moist Neck: supple Cardiovascular: RRR without murmur. No JVD    Respiratory/Chest: scattered rhonchi, basilar sounds, asynchronous respirations GI/Abdomen: BS +, non-tender, non-distended Ext: no clubbing, cyanosis  Psych: pleasant and cooperative  Musculoskeletal:     low back pain. Pain left scapula.    left scapular tenderness. LE edema improvedr, trace at left elbow Neurological:     Comments: Intact to light touch in all 4 extremities B/L  CN's intact on exam. Good insight and awareness. Ox3- appropriate   RUE- 5-/5 in deltoid, biceps, triceps, WE, grip and FA LUE- deltoid 2-/5; weak vs pain; Biceps 4-/5; Triceps 3-/5; WE 4-/5 grip 4-/5; FA 4-/5 RLE- 4+/5 in HF, KE, KF DF and PF LLE- Proximal 3to 3+5; distal 4-/5--stable appearance Skin:    General:  Skin is warm and dry.     groin and abdominal wounds are CDI       Assessment/Plan: 1. Functional deficits which require 3+ hours per day of interdisciplinary therapy in a comprehensive inpatient rehab setting. Physiatrist is providing close team supervision and 24 hour management of active medical problems listed below. Physiatrist and rehab team continue to assess barriers to discharge/monitor patient progress toward functional and medical goals  Care Tool:  Bathing    Body parts bathed by patient: Right arm, Left arm, Chest, Face   Body parts bathed by helper: Abdomen, Front perineal area, Buttocks, Right upper leg, Left upper leg, Right lower leg, Left lower leg     Bathing assist Assist Level: Moderate Assistance - Patient 50 - 74%     Upper Body Dressing/Undressing Upper body dressing   What is the patient wearing?: Button up shirt    Upper body assist Assist Level: Minimal Assistance - Patient > 75%    Lower Body Dressing/Undressing Lower body dressing      What is the patient wearing?: Pants     Lower body assist Assist for lower body dressing: Moderate Assistance -  Patient 50 - 74%     Toileting Toileting    Toileting assist Assist for toileting: Total Assistance - Patient < 25% (required urinal)     Transfers Chair/bed transfer  Transfers assist  Chair/bed transfer activity did not occur: Refused  Chair/bed transfer assist level: Contact Guard/Touching assist Chair/bed transfer assistive device: Programmer, multimedia   Ambulation assist   Ambulation activity did not occur: Safety/medical concerns  Assist level: 2 helpers Assistive device: Walker-rolling Max distance: 50'   Walk 10 feet activity   Assist  Walk 10 feet activity did not occur: Safety/medical concerns  Assist level: 2 helpers Assistive device: Walker-rolling   Walk 50 feet activity   Assist Walk 50 feet with 2 turns activity did not occur: Safety/medical  concerns  Assist level: 2 helpers Assistive device: Walker-rolling    Walk 150 feet activity   Assist Walk 150 feet activity did not occur: Safety/medical concerns         Walk 10 feet on uneven surface  activity   Assist Walk 10 feet on uneven surfaces activity did not occur: Safety/medical concerns         Wheelchair     Assist Is the patient using a wheelchair?: Yes Type of Wheelchair: Manual    Wheelchair assist level: Supervision/Verbal cueing Max wheelchair distance: 40    Wheelchair 50 feet with 2 turns activity    Assist        Assist Level: Dependent - Patient 0%   Wheelchair 150 feet activity     Assist      Assist Level: Dependent - Patient 0%   Blood pressure 121/60, pulse 90, temperature 97.7 F (36.5 C), resp. rate 16, height 5\' 10"  (1.778 m), weight 74 kg, SpO2 96 %.  Assessment/Plan: Functional deficits secondary  to Debility from MSSA bactermia/PE/endovascular repair of aortic leak; PE and Previous styage IIIB lung CA with new hepatic and bone metastases.  ELOS-  10/11/21 Pt may shower -Continue CIR therapies including PT, OT, and SLP  Discussed progress and course of illness with patient's wife.  2. DVT/R lieofemoral DVT- Lovenox 70 mg BID- until CT in 4 weeks by Vascular with potential switch to oral 3. Pain Management: Tylenola nd Percocet as needed for pain- for LUE/LLE and back pain .   12/12 prn percocet,  Restarted Gabapentin 300 mg QHS-   12/24 continue scheduled percocet in am and kpad   -dc'ed lidocaine patch and are trying aspercreme over L shoulder   -continue flector for back 4. Mood:-  Palliative care following due to his cancer and ego support as required-  ?neuropsychology    5. Skin/Wound Care: encouraging nutrition  -wounds healing 6. . Fluids/Electrolytes/Nutrition: encourage PO -pt is drinking protein supps and eating fairly well -weights stable last week, recheck tomorrow  7. MSSA bacteremia- is on  Cefazolin q8 hours for 4 weeks from 09/09/21 -thoracentesis cultures are negative. Fungal cx with gs still pending  8. Persistent Afib- is recent diagnosis- Metoprolol just increased to 75 mg BID and on Diltiazem 120 mg daily- monitor for hypotension and hold meds if BP <100.    Vitals:   10/03/21 0510 10/03/21 0857  BP: 121/60   Pulse: 90   Resp: 16   Temp: 97.7 F (36.5 C)   SpO2: 96% 96%    12/26: Metoprolol has been decreased back to 50mg  BID previously. Wife is concerned that patient has never been seen by cardiology. She is not sure what to do if  patient is hypotensive at home. Blood pressures have been stable recently- have not been recorded during therapy sessions but significant fatigue noted. Will schedule for outpatient follow-up with cardiology. Katharine Look discussed with cardiology to set up today.  9. S/p aortic leak/endovascular repair- periaortic hematoma- vascular to reorder at CT in 4 weeks to follow up 10. Previous Stage IIIB Lung CA- with new hepatic and bone mets- likely Stage IV-  -palliative care follow up appreciated, discussed palliative care with wife- she has reached out to patient's PCP to discuss.  -pt is DNR - F/U with Oncology and maybe hospice after d/c.  12/24 intermittent DOE, seems better  -cxr without significant change  -oxygen  -xopenex and pulmicort scheduled HHN have helped  -decrease xopenex to bid along with pulmicort  -add scheduled mucinex dm to help him mobilize secretions 11. Constipation-    Last bm 12/21--->sorbitol today 12/24 12. PICC line for IV ABX- as of 09/17/21 13. LE Edema--improved with ACE wraps and elevation, nutrition 14. Hypokalemia: supplemented today  >35 minutes spent in discussion with patient and primarily his wife regarding patient's hospital course shortness of breath, continued nebulizer treatments, blood pressures, fatigue, palliative care services, consulting cardiology for outpatient follow-up             LOS: 16  days A FACE TO FACE EVALUATION WAS PERFORMED  Martha Clan P Erendida Wrenn 10/03/2021, 11:47 AM

## 2021-10-03 NOTE — Progress Notes (Signed)
Occupational Therapy Weekly Progress Note  Patient Details  Name: Leon Lyons MRN: [id number] Date of Birth: Feb 15, 1933  Beginning of progress report period: September 18, 2021 End of progress report period: October 03, 2021  Patient has met 3 of 3 short term goals.  Patient is making steady progress towards OT goals. Pt is at an overall min A level for sit<>stands and functional transfers. Pt is making progress with self-care tasks requiring min A for UB ADLs due to L shoulder pain, and min/mod A for LB ADLs. Patient has had increased O2 requirements since eval. He continues to have low endurance, but is improving In this area.  Patient continues to demonstrate the following deficits: muscle weakness, decreased cardiorespiratoy endurance and decreased oxygen support, and decreased sitting balance, decreased standing balance, decreased postural control, and decreased balance strategies and therefore will continue to benefit from skilled OT intervention to enhance overall performance with BADL and Reduce care partner burden.  Patient progressing toward long term goals..  Continue plan of care.  OT Short Term Goals Week 2:  OT Short Term Goal 1 (Week 2): PT will be able to thread LB clothing with min A with AE prn OT Short Term Goal 1 - Progress (Week 2): Met OT Short Term Goal 2 (Week 2): Pt will consistently stand with mod A at the sink in preparation for BADL tasks OT Short Term Goal 2 - Progress (Week 2): Met OT Short Term Goal 3 (Week 2): Patient will demonstrate R elbow ROM for edema management with min cues OT Short Term Goal 3 - Progress (Week 2): Met OT Short Term Goal 4 (Week 2): Patient will complete 1 step of toileting task Week 3:  OT Short Term Goal 1 (Week 3): LTG=STG 2/2 ELOS   Therapy Documentation Precautions:  Precautions Precautions: Fall, Other (comment) Precaution Comments: monitor BP, HR, and SpO2; possible L shoulder RC injury ? Restrictions Weight Bearing  Restrictions: No _0   Therapy/Group: Individual Therapy  Valma Cava 10/03/2021, 10:21 PM

## 2021-10-03 NOTE — Progress Notes (Signed)
MD swartz notified of pts potassium level. New orders given and implemented.

## 2021-10-03 NOTE — Discharge Summary (Signed)
Physician Discharge Summary  Patient ID: Leon Lyons MRN: [id number] DOB/AGE: [id number] 85 y.o.  Admit date: 09/17/2021 Discharge date: 10/11/2021  Discharge Diagnoses:  Principal Problem:   Debility Impairment in mobility and ADLS Active problems: MSSA bacteremia Metastatic lung cancer; stage IIIB (liver and bone mets) Left pleural effusion Right pleural effusion Hypoxia Persistent atrial fibrillation UTI Urinary retention RLE DVT/PE Contained aortic leak s/p EVAR Constipation Hypokalemia  Discharged Condition: fair/stable  Significant Diagnostic Studies: DG Chest 1 View  Result Date: 10/05/2021 CLINICAL DATA:  Recent thoracentesis EXAM: CHEST  1 VIEW COMPARISON:  Chest x-ray 10/03/2021 FINDINGS: Heart is mildly enlarged. Mediastinum appears grossly stable. Tortuous thoracic aorta with calcified plaques in the arch. Right PICC tip in the right atrium. Interval improved aeration of the left hemithorax secondary to decreased pleural effusion and atelectasis. There is a persistent small to moderate left-sided pleural effusion and infiltrates in the aerated mid lung zone. Persistent small to moderate right pleural effusion with associated hazy opacities in the right lower lung zone likely representing layering effusion and atelectasis. No pneumothorax. IMPRESSION: 1. Small to moderate bilateral pleural effusions with associated atelectasis/infiltrate. Improved aeration of the left lung since previous study. 2. No pneumothorax visualized. Electronically Signed   By: Ofilia Neas M.D.   On: 10/05/2021 08:15   DG Chest 1 View  Result Date: 09/15/2021 CLINICAL DATA:  Post left thoracentesis.  History of lung cancer EXAM: CHEST  1 VIEW COMPARISON:  09/11/2021 FINDINGS: Decreasing left pleural effusion following thoracentesis. Continued airspace disease throughout much of the left lung. No pneumothorax. Volume loss on the left. Heart is upper limits normal in size. Mild vascular  congestion. IMPRESSION: Decreasing left effusion following thoracentesis.  No pneumothorax. Continued volume loss on the left and diffuse left lung airspace disease. Electronically Signed   By: Rolm Baptise M.D.   On: 09/15/2021 09:58   DG Chest 2 View  Result Date: 10/03/2021 CLINICAL DATA:  Hypoxia pleural effusion EXAM: CHEST - 2 VIEW COMPARISON:  09/28/2021, CT 09/10/2021 FINDINGS: Near complete opacification of the left thorax likely due to progressed pleural effusion and airspace disease. Mild shift of mediastinal contents to the left suggesting some volume loss. Small right-sided effusion slightly increased. Cardiomegaly with vascular congestion and probable mild edema. IMPRESSION: Worsening bilateral effusions and left airspace disease, now with near complete white out left thorax, there is a small amount of aerated lung at the left apex. Cardiomegaly with vascular congestion and probable mild edema. Electronically Signed   By: Donavan Foil M.D.   On: 10/03/2021 18:56   DG Chest 2 View  Result Date: 09/28/2021 CLINICAL DATA:  Shortness of breath. EXAM: CHEST - 2 VIEW COMPARISON:  09/24/2021 FINDINGS: Left base collapse/consolidation with left parahilar airspace disease and left pleural effusion is similar to prior. Airspace opacity in the infrahilar right lung base is similar. Right PICC line again noted. The visualized bony structures of the thorax show no acute abnormality. IMPRESSION: No substantial change. Left base collapse/consolidation with left pleural effusion and medial right base airspace disease. Electronically Signed   By: Misty Stanley M.D.   On: 09/28/2021 10:10   DG Sacrum/Coccyx  Result Date: 10/10/2021 CLINICAL DATA:  Lower back and sacral pain, back pain, known history of metastatic lung cancer EXAM: SACRUM AND COCCYX - 2+ VIEW COMPARISON:  09/10/2021. FINDINGS: Pelvic inlet, pelvic outlet, and lateral views of the sacrum and coccyx are obtained. There are no acute displaced  fractures. The known lytic lesion within the right sacral  ala seen on prior CT examinations is not well visualized by radiograph. Alignment is anatomic. Prominent spondylosis and facet hypertrophy at the lumbosacral junction. Aorto iliac stent graft is identified. Peripherally calcified pelvic fluid collection again seen in the left lower quadrant. IMPRESSION: 1. The known lytic metastatic lesion in the right sacral ala seen on prior CT is not visible by radiograph. Please refer to CT examination 09/10/2021. 2. No other acute bony abnormalities. 3. Moderate spondylosis and facet hypertrophy at the lumbosacral junction. 4. Stable peripherally calcified pelvic fluid collection. Electronically Signed   By: Randa Ngo M.D.   On: 10/10/2021 15:30   CT CHEST WO CONTRAST  Result Date: 10/05/2021 CLINICAL DATA:  Pleural mass EXAM: CT CHEST WITHOUT CONTRAST TECHNIQUE: Multidetector CT imaging of the chest was performed following the standard protocol without IV contrast. COMPARISON:  Chest radiograph 10/05/2021.  CT chest 09/10/2021 FINDINGS: Cardiovascular: Mild cardiac enlargement. No pericardial effusion. Coronary artery and aortic calcification. No aortic aneurysm. Central venous catheter with tip at the cavoatrial junction region. Mediastinum/Nodes: Evaluation mediastinal structures is limited without IV contrast material. Scattered lymph nodes are not pathologically enlarged. Esophagus is decompressed. Thyroid gland is unremarkable. Lungs/Pleura: Moderate to large bilateral pleural effusions, increasing since prior study, particularly on the right side. Consolidation and volume loss in both lung bases, again progressing mostly on the right. Patchy airspace infiltrates are developing in the right lung and persist on the left. Interstitial thickening is increasing, suggesting edema. Changes suggest developing edema and/or multifocal pneumonia superimposed on to the pre-existing left lung neoplasm and radiation  changes. Upper Abdomen: Poorly defined low-attenuation lesions in the liver are likely metastatic. Visualization is limited without IV contrast material. Musculoskeletal: Lytic metastasis with soft tissue component involving the left scapula and left ribs without change. IMPRESSION: 1. Underlying left lung neoplasm with consolidation and volume loss in the left lower lung and left lung infiltration consistent with radiation changes. These features are similar to the prior study. 2. Increasing bilateral pleural effusions, particularly on the right with basilar atelectasis. Increasing patchy airspace disease in the lungs with increasing interstitial changes. Changes likely represent superimposed and progressing edema/multifocal pneumonia on top of the chronic neoplastic and radiation changes. 3. Stable appearance of bone metastasis in the left rib and left scapula. 4. Probable hepatic metastasis. Electronically Signed   By: Lucienne Capers M.D.   On: 10/05/2021 17:29   MR KNEE LEFT W WO CONTRAST  Result Date: 09/15/2021 CLINICAL DATA:  Staphylococcus bacteremia. Tenderness along the suprapatellar area. Left knee pain. EXAM: MRI OF THE LEFT KNEE WITHOUT AND WITH CONTRAST TECHNIQUE: Multiplanar, multisequence MR imaging of the left knee was performed both before and after administration of intravenous contrast. CONTRAST:  62mL GADAVIST GADOBUTROL 1 MMOL/ML IV SOLN COMPARISON:  Radiographs dated September 10, 2021 FINDINGS: The exam is limited due to motion. MENISCI Medial: Degenerative changes without evidence of acute tear Lateral: Degenerative changes without evidence of acute tear LIGAMENTS Cruciates: ACL and PCL are intact. Collaterals: Medial collateral ligament is intact. Lateral collateral ligament complex is intact. CARTILAGE Patellofemoral: Advanced articular cartilage thinning. No evidence of focal full-thickness defect. Medial: Advanced articular cartilage thinning without evidence of full-thickness defect.  Lateral: Advanced articular cartilage thinning without evidence of full-thickness defect. JOINT: Small suprapatellar joint effusion. Normal Hoffa's fat-pad. No plical thickening. POPLITEAL FOSSA: Popliteus tendon is intact. No Baker's cyst. EXTENSOR MECHANISM: Intact quadriceps tendon. Intact patellar tendon. Intact lateral patellar retinaculum. Intact medial patellar retinaculum. Intact MPFL. BONES: No aggressive osseous lesion. No fracture  or dislocation. Other: No fluid collection or hematoma. Generalized muscle atrophy. Mild subcutaneous soft tissue edema. IMPRESSION: 1. Small joint effusion, likely reactive. No abnormal enhancement or soft tissue abnormality concerning for septic arthritis. 2. Generalized articular cartilage thinning without evidence of full-thickness defect. No evidence of ligamentous injury. 3. The marrow signal is within normal limits. No evidence of fracture or osteonecrosis. Electronically Signed   By: Keane Police D.O.   On: 09/15/2021 16:49   DG CHEST PORT 1 VIEW  Result Date: 10/08/2021 CLINICAL DATA:  87 year old male with history of shortness of breath. EXAM: PORTABLE CHEST 1 VIEW COMPARISON:  Chest x-ray 10/05/2021. FINDINGS: There is a right upper extremity PICC with tip terminating in the superior cavoatrial junction. Skin fold artifact projecting over the upper lateral right hemithorax. Patchy areas of interstitial prominence and airspace consolidation noted throughout the lungs bilaterally (left greater than right). Moderate to large left partially loculated pleural effusion and small to moderate right pleural effusion. Heart size appears mildly enlarged. The patient is rotated to the left on today's exam, resulting in distortion of the mediastinal contours and reduced diagnostic sensitivity and specificity for mediastinal pathology. Atherosclerotic calcifications are noted in the thoracic aorta. IMPRESSION: 1. Overall, the radiographic appearance the chest is very similar  to the prior examination, which when correlated with recent chest CT likely reflects chronic postradiation changes throughout the left lung, along with areas of predominantly atelectasis in the lung bases bilaterally and bilateral pleural effusions which are chronic and similar to the prior study, as above. 2. Aortic atherosclerosis. Electronically Signed   By: Vinnie Langton M.D.   On: 10/08/2021 09:58   DG CHEST PORT 1 VIEW  Result Date: 09/24/2021 CLINICAL DATA:  Shortness of breath, history of lung cancer on the left, initial encounter EXAM: PORTABLE CHEST 1 VIEW COMPARISON:  09/17/2021 FINDINGS: Persistent left-sided pleural effusion is noted as well as parenchymal density in the residual left upper lobe stable in appearance from the prior exam. Hyperinflation of right lung is seen. Increasing right basilar infiltrate is seen. Right-sided PICC line is noted in the mid right atrium. No bony abnormality is seen. IMPRESSION: Stable left-sided pleural effusion as well as parenchymal density similar to that seen on prior plain film. Increasing right basilar infiltrate is seen. Electronically Signed   By: Inez Catalina M.D.   On: 09/24/2021 19:54   DG CHEST PORT 1 VIEW  Result Date: 09/17/2021 CLINICAL DATA:  Weakness EXAM: PORTABLE CHEST 1 VIEW COMPARISON:  09/15/2021 FINDINGS: Moderate size left-sided pleural effusion, stable to slightly increased from prior. Airspace opacity persists within the left perihilar region and left lung base. Similar right infrahilar and right basilar opacities. No pneumothorax. Stable cardiomegaly with aortic atherosclerosis. Interval placement of right-sided PICC line with distal tip terminating at the level of the superior cavoatrial junction. IMPRESSION: 1. Interval placement of right-sided PICC line with distal tip terminating at the level of the superior cavoatrial junction. 2. Moderate size left-sided pleural effusion, stable to slightly increased from prior. 3.  Persistent bilateral airspace opacities, left greater than right. Electronically Signed   By: Davina Poke D.O.   On: 09/17/2021 12:26   DG Shoulder Left  Result Date: 09/20/2021 CLINICAL DATA:  Shoulder pain EXAM: LEFT SHOULDER - 2+ VIEW COMPARISON:  Chest CT 08/15/2021 FINDINGS: AC joint is intact. Probable loculated left pleural effusion. No fracture or malalignment. Lytic lesion involving the inferior scapula. IMPRESSION: 1. Lytic lesion involving left scapula consistent with metastatic disease, present on CT from  November 2. No acute osseous abnormality left shoulder 3. Moderate probably loculated left pleural effusion Electronically Signed   By: Donavan Foil M.D.   On: 09/20/2021 17:56   ECHO TEE  Result Date: 09/14/2021    TRANSESOPHOGEAL ECHO REPORT   Patient Name:   Leon Lyons Date of Exam: 09/14/2021 Medical Rec #:  [id number]         Height:       70.0 in Accession #:    [id number]        Weight:       166.0 lb Date of Birth:  1933/07/05         BSA:          1.928 m Patient Age:    35 years          BP:           92/49 mmHg Patient Gender: M                 HR:           77 bpm. Exam Location:  Inpatient Procedure: Transesophageal Echo, Cardiac Doppler and Color Doppler Indications:     Bacteremia  History:         Patient has prior history of Echocardiogram examinations, most                  recent 09/11/2021.  Sonographer:     Merrie Roof RDCS Referring Phys:  [id number] Leanor Kail Diagnosing Phys: Sanda Klein MD PROCEDURE: After discussion of the risks and benefits of a TEE, an informed consent was obtained from the patient. The transesophogeal probe was passed without difficulty through the esophogus of the patient. Sedation performed by different physician. The patient was monitored while under deep sedation. The patient's vital signs; including heart rate, blood pressure, and oxygen saturation; remained stable throughout the procedure. The patient developed no  complications during the procedure. IMPRESSIONS  1. Left ventricular ejection fraction, by estimation, is 60 to 65%. The left ventricle has normal function. The left ventricle has no regional wall motion abnormalities.  2. Right ventricular systolic function is normal. The right ventricular size is normal.  3. No left atrial/left atrial appendage thrombus was detected. The LAA emptying velocity was 70 cm/s.  4. Large pleural effusion in the left lateral region.  5. The mitral valve is myxomatous. Trivial mitral valve regurgitation. No evidence of mitral stenosis. There is mild late systolic prolapse of both leaflets of the mitral valve.  6. Tricuspid valve regurgitation is mild to moderate.  7. The aortic valve is tricuspid. There is mild calcification of the aortic valve. There is mild thickening of the aortic valve. Aortic valve regurgitation is mild. Aortic valve sclerosis/calcification is present, without any evidence of aortic stenosis.  8. There is Severe (Grade IV) plaque involving the aortic arch and descending aorta.  9. The inferior vena cava is normal in size with greater than 50% respiratory variability, suggesting right atrial pressure of 3 mmHg. Conclusion(s)/Recommendation(s): No evidence of vegetation/infective endocarditis on this transesophageael echocardiogram. Consider left thoracentesis as part of further evaluation for cause of bacteremia. FINDINGS  Left Ventricle: Left ventricular ejection fraction, by estimation, is 60 to 65%. The left ventricle has normal function. The left ventricle has no regional wall motion abnormalities. The left ventricular internal cavity size was normal in size. There is  no left ventricular hypertrophy. Right Ventricle: The right ventricular size is normal. No increase in right ventricular wall thickness. Right  ventricular systolic function is normal. Left Atrium: Left atrial size was normal in size. No left atrial/left atrial appendage thrombus was detected. The  LAA emptying velocity was 70 cm/s. Right Atrium: Right atrial size was normal in size. Pericardium: There is no evidence of pericardial effusion. Mitral Valve: The mitral valve is myxomatous. There is mild late systolic prolapse of both leaflets of the mitral valve. Trivial mitral valve regurgitation. No evidence of mitral valve stenosis. Tricuspid Valve: The tricuspid valve is normal in structure. Tricuspid valve regurgitation is mild to moderate. No evidence of tricuspid stenosis. Aortic Valve: The noncoronary cusp is thickened, calcified and restricted in motion. The aortic valve is tricuspid. There is mild calcification of the aortic valve. There is mild thickening of the aortic valve. Aortic valve regurgitation is mild. Aortic valve sclerosis/calcification is present, without any evidence of aortic stenosis. Pulmonic Valve: The pulmonic valve was normal in structure. Pulmonic valve regurgitation is not visualized. No evidence of pulmonic stenosis. Aorta: The aortic root is normal in size and structure. There is severe (Grade IV) plaque involving the aortic arch and descending aorta. Venous: The inferior vena cava is normal in size with greater than 50% respiratory variability, suggesting right atrial pressure of 3 mmHg. IAS/Shunts: No atrial level shunt detected by color flow Doppler. Additional Comments: There is a large pleural effusion in the left lateral region. Sanda Klein MD Electronically signed by Sanda Klein MD Signature Date/Time: 09/14/2021/3:37:22 PM    Final    [place] EKG SITE RITE  Result Date: 09/16/2021 If Site Rite image not attached, placement could not be confirmed due to current cardiac rhythm.  IR THORACENTESIS ASP PLEURAL SPACE W/IMG GUIDE  Result Date: 09/15/2021 INDICATION: Patient with a history of lung cancer and atrial fibrillation presents today with a left pleural effusion. Interventional radiology asked to perform a diagnostic and therapeutic thoracentesis. EXAM:  ULTRASOUND GUIDED THORACENTESIS MEDICATIONS: 1% lidocaine 10 mL COMPLICATIONS: None immediate. PROCEDURE: An ultrasound guided thoracentesis was thoroughly discussed with the patient and questions answered. The benefits, risks, alternatives and complications were also discussed. The patient understands and wishes to proceed with the procedure. Written consent was obtained. Ultrasound was performed to localize and mark an adequate pocket of fluid in the left chest. The area was then prepped and draped in the normal sterile fashion. 1% Lidocaine was used for local anesthesia. Under ultrasound guidance a 6 Fr Safe-T-Centesis catheter was introduced. Thoracentesis was performed. The procedure was stopped early due to patient discomfort. The catheter was removed and a dressing applied. FINDINGS: A total of approximately 800 mL of clear yellow fluid was removed. Samples were sent to the laboratory as requested by the clinical team. IMPRESSION: Successful ultrasound guided left thoracentesis yielding 800 mL of pleural fluid. Read by: Soyla Dryer, NP Electronically Signed   By: Jacqulynn Cadet M.D.   On: 09/15/2021 10:41    Labs:  Basic Metabolic Panel: Recent Labs  Lab 10/06/21 1613 10/07/21 0540 10/08/21 0332 10/09/21 0403 10/10/21 0332 10/11/21 0651  NA 133* 134* 135 134* 135 134*  K 3.5 3.5 3.4* 3.3* 3.2* 3.8  CL 85* 86* 87* 90* 89* 90*  CO2 43* 42* 41* 39* 39* 39*  GLUCOSE 155* 99 94 95 94 93  BUN 18 10 10 11 13 11   CREATININE 0.61 0.48* 0.53* 0.53* 0.47* 0.58*  CALCIUM 8.5* 8.8* 8.8* 8.5* 8.7* 8.9  MG 1.7 1.6* 2.0 2.0 1.9 1.9    CBC: No results for input(s): WBC, NEUTROABS, HGB, HCT, MCV, PLT  in the last 168 hours.   CBG: No results for input(s): GLUCAP in the last 168 hours.  Brief HPI:   Leon Lyons is a 87 y.o. male who presented to the emergency department complaining of generalized weakness, decreased appetite, episodic dyspnea in the setting of symptomatic anemia.  He  had recently undergone hemicolectomy for volvulus with the development of atrial fibrillation and pulmonary embolism.  His history significant for lung cancer.  Initial work-up included CT scan which showed a contained leak at the aortic bifurcation with surrounding hematoma.  He had a small abdominal aortic aneurysm.  Vascular surgery was consulted and he underwent endovascular aortic repair on 12/4.  He is to maintain Lovenox until recheck with CT scan in 4 weeks.  He required 3 units packed red blood cells.  Imaging was also significant for right iliofemoral deep venous thrombosis as well as left upper lobe infiltrate, left pleural effusion and rib metastases.   Hospital Course: Leon Lyons was admitted to rehab 09/17/2021 for inpatient therapies to consist of PT, ST and OT at least three hours five days a week. Past admission physiatrist, therapy team and rehab RN have worked together to provide customized collaborative inpatient rehab.  Patient continued to participate and cooperate with therapies.  On 12/13 he complained of left shoulder pain with movement limiting OT progress.  Left shoulder x-rays revealed no fracture or malalignment.  A lytic lesion involving the left scapula was consistent with metastatic disease present on CT from November.  No acute abnormality.  His treatment for MSSA bacteremia with cefazolin every 8 hours for 4 weeks continued.  Thorocentesis culture follow-up was negative.  Patient had intermittent episodes of dyspnea on exertion.  On 12/21 he reported increased shortness of breath and anxiety associated with this.  Chest x-ray was stable and similar as compared to 12/17.  O2 sats were normal on 2 L nasal cannula.  EKG consistent with persistent A. fib.  Heart rate controlled.  His metoprolol was increased to 75 mg twice a day and he was on diltiazem 120 mg daily.  Hand-held nebulizer treatments with Xopenex and Pulmicort improve dyspnea.  Mucinex DM added to help mobilize  secretions.  Nebulizers were decreased to twice a day.  On 12/26 he was noted to have a potassium 2.7 and this was repleted.  He had no vomiting or diarrhea.  The patient had been seen by [facility] and they were reconsulted on 12/26. Recommend oral anticoagulation when cleared by vascular surgery and consider DCCV if tolerates AC or if becomes symptomatic. Worsening appearance of left thorax on follow-up chest xray 10/04/2021. Pulmonary consultation obtained. Left thoracentesis performed on 12/28 with 1300 cc clear, yellow fluid removed. Sent for culture.  They continued to follow and discuss recurrent pleural effusion.  Discussed possible Pleurx catheter should the fluid reaccumulate quickly.  The patient indicated he desired conservative treatment with thoracentesis and expressed that hospice would be reasonable as he wants to limit his suffering.  Discussion with the patient and his wife to review goals of care was carried out.  The patient did not want to unnecessarily prolong his life.  Pulmonary medicine made referral to palliative care and the social worker made arrangements for outpatient hospice of [facility] referral.  Blood pressures were monitored on TID basis and adjusted with good control.  Rehab course: During patient's stay in rehab weekly team conferences were held to monitor patient's progress, set goals and discuss barriers to discharge. At admission,  patient required max A to come to EOB and to remain standing, total A for LB dressing  He has had improvement in activity tolerance, balance, postural control as well as ability to compensate for deficits. He has had improvement in functional use RUE/LUE  and RLE/LLE as well as improvement in awareness.  He may transfers with min a and cues for body mechanics.  Ambulates with CGA and +2 wheelchair follow with cues to increase stride length to decrease risk for falls.  Stand step transfer back to bed with HHA min A.  Sit to supine  with min A management of legs.  Disposition:  Discharge disposition: 01-Home or Self Care        Diet: Heart healthy  Special Instructions:  1) No driving, alcohol consumption or tobacco products.  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 Family considering goals of care.   Discharge patient   Complete by: As directed    Discharge disposition: 01-Home or Self Care   Discharge patient date: 10/11/2021     30-35 minutes were spent on discharge planning and discharge summary.  Allergies as of 10/11/2021       Reactions   Diclofenac Sodium Other (See Comments)   Kidneys were affected, was on Diclofenac 75mg  po BID (not topical) for Left shoulder pain,        Medication List     STOP taking these medications    ceFAZolin  IVPB Commonly known as: ANCEF   Chlorhexidine Gluconate Cloth 2 % Pads   cyclobenzaprine 5 MG tablet Commonly known as: FLEXERIL   docusate sodium 100 MG capsule Commonly known as: COLACE   gabapentin 300 MG capsule Commonly known as: NEURONTIN   guaiFENesin-dextromethorphan 100-10 MG/5ML syrup Commonly known as: ROBITUSSIN DM Replaced by: dextromethorphan-guaiFENesin 30-600 MG 12hr tablet   oxyCODONE-acetaminophen 5-325 MG tablet Commonly known as: PERCOCET/ROXICET Replaced by: oxyCODONE-acetaminophen 7.5-325 MG tablet   Vitamin D3 Super Strength 50 MCG (2000 UT) Caps Generic drug: Cholecalciferol       TAKE these medications    acetaminophen 500 MG tablet Commonly known as: TYLENOL Take 500-1,000 mg by mouth every 8 (eight) hours as needed (for pain).   alum & mag hydroxide-simeth 200-200-20 MG/5ML suspension Commonly known as: MAALOX/MYLANTA Take 15-30 mLs by mouth every 2 (two) hours as needed for indigestion.   bisacodyl 10 MG suppository Commonly known as: DULCOLAX Place 1 suppository (10 mg total) rectally daily as  needed for moderate constipation.   budesonide 0.25 MG/2ML nebulizer solution Commonly known as: PULMICORT Take 2 mLs (0.25 mg total) by nebulization 2 (two) times daily.   dextromethorphan-guaiFENesin 30-600 MG 12hr tablet Commonly known as: MUCINEX DM Take 1 tablet by mouth 2 (two) times daily. Replaces: guaiFENesin-dextromethorphan 100-10 MG/5ML syrup   diclofenac 1.3 % Ptch Commonly known as: FLECTOR Place 1 patch onto the skin 2 (two) times daily. Apply to left shoulder   diltiazem 120 MG tablet Commonly known as: CARDIZEM Take 120 mg by mouth daily.   enoxaparin 80 MG/0.8ML injection Commonly known as: LOVENOX Inject 70 mg into the skin 2 (two) times daily.   feeding supplement Liqd Take 237 mLs by mouth 2 (two) times daily between meals.   folic acid 1 MG tablet Commonly known as: FOLVITE Take 1 tablet (1 mg total) by mouth daily.   levalbuterol 0.63 MG/3ML nebulizer solution Commonly known as: XOPENEX Take 3  mLs (0.63 mg total) by nebulization 2 (two) times daily.   metoprolol tartrate 50 MG tablet Commonly known as: LOPRESSOR Take 1 tablet (50 mg total) by mouth 2 (two) times daily. Hold if systolic pressure <322 or heart rate <65   multivitamin with minerals Tabs tablet Take 1 tablet by mouth daily.   Muscle Rub 10-15 % Crea Apply 1 application topically 2 (two) times daily. Left shoulder blade   oxyCODONE-acetaminophen 7.5-325 MG tablet Commonly known as: PERCOCET Take 2 tablets by mouth every 6 (six) hours as needed for severe pain. Replaces: oxyCODONE-acetaminophen 5-325 MG tablet   pantoprazole 40 MG tablet Commonly known as: PROTONIX Take 1 tablet (40 mg total) by mouth daily.   polyethylene glycol 17 g packet Commonly known as: MIRALAX / GLYCOLAX Take 17 g by mouth daily.   rosuvastatin 5 MG tablet Commonly known as: CRESTOR Take 1 tablet (5 mg total) by mouth daily.   senna-docusate 8.6-50 MG tablet Commonly known as: Senokot-S Take 2  tablets by mouth at bedtime.   sodium chloride 0.65 % Soln nasal spray Commonly known as: OCEAN Place 1 spray into both nostrils as needed for congestion.        Follow-up Information     [name], MD Follow up.   Specialty: Family Medicine Why: Call tomorrow for hospital follow-up appointment Contact information: [place] [address] [phone]         Croitoru, Dani Gobble, MD. Call in 1 day(s).   Specialty: Cardiology Why: Call tomorrow for follow-up hospital appointment Contact information: [address] [place] [address] [phone]         [name], MD Follow up.   Specialty: Oncology Why: Call tomorrow for hospital follow-up appointment Contact information: [place] [address] [phone]         Laurice Record, MD Follow up on 10/21/2021.   Specialty: Infectious Diseases Why: Appointment at [facility] information: [address] [place] [address] [phone]         Angelia Mould, MD Follow up on 11/03/2021.   Specialties: Vascular Surgery, Cardiology Why: Appointment at 11:20 am. Contact information: [place] [address] [phone]                 Signed: Barbie Banner 10/11/2021, 2:31 PM

## 2021-10-03 NOTE — Consult Note (Addendum)
Cardiology Consult    Patient ID: Leon Lyons MRN: [id number], DOB/AGE: [birth year]   Admit date: 09/17/2021 Date of Consult: 10/03/2021  Primary Physician: [name], MD Primary Cardiologist: None - new - seen by M. Mohammad Granade, MD - will f/u in [place] Requesting Provider: Daleen Squibb, MD  Patient Profile    Leon Lyons is a 87 y.o. male with a history of metastatic non-small cell lung cancer, PE and RLE DVT on lovenox, persistent Afib, colonic volvulus, contained rupture of Ao bifurcation/periaortic hematoma s/p endovascular stent grafting, and ongoing deconditioning, who is being seen today for the evaluation of Afib at the request of Dr. Naaman Plummer.  Past Medical History   Past Medical History:  Diagnosis Date   Acute deep vein thrombosis (DVT) of right lower extremity (HCC)    Aortic rupture (contained)    a. 09/2021 s/p EVAR.   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 s/p surgical repair 07/2021.   History of echocardiogram    a. 09/10/2021 Echo: EF 60-65%, no rwma, triv MR, mod TR; b. 09/2021 Echo: EF 60-65%, no rwma, nl RV size/fxn, no LA/LAA thrombus, large left pl effusion, triv MR w/mild late MVP, mild-mod TR, mild AI, Ao sclerosis w/o stenosis, Gr IV plaque of Ao arch/desc Ao.   History of radiation therapy 03/24/2021   left lung   02/11/2021-03/24/2021  Dr Gery Pray   MSSA bacteremia    a. 09/2021 TEE: EF 60-65%, no vegetation.   Non-small cell lung cancer (HCC)    Persistent atrial fibrillation (HCC)    Pleural effusion, left    a. 09/2021 s/p thoracentesis - 868ml.   Pulmonary embolism ([place])    Remote Tobacco use    a. Quit 60 + yrs ago.    Past Surgical History:  Procedure Laterality Date   ABDOMINAL AORTIC ENDOVASCULAR STENT GRAFT Bilateral 09/11/2021   Procedure: EVAR;  Surgeon: Angelia Mould, MD;  Location: [facility];  Service: Vascular;  Laterality: Bilateral;   APPENDECTOMY     BRONCHIAL BIOPSY  01/07/2021   Procedure: BRONCHIAL BIOPSIES;  Surgeon:  Garner Nash, DO;  Location: [facility];  Service: Pulmonary;;   BRONCHIAL BRUSHINGS  01/07/2021   Procedure: BRONCHIAL BRUSHINGS;  Surgeon: Garner Nash, DO;  Location: [facility];  Service: Pulmonary;;   BRONCHIAL NEEDLE ASPIRATION BIOPSY  01/07/2021   Procedure: BRONCHIAL NEEDLE ASPIRATION BIOPSIES;  Surgeon: Garner Nash, DO;  Location: [place];  Service: Pulmonary;;   BRONCHIAL WASHINGS  01/07/2021   Procedure: BRONCHIAL WASHINGS;  Surgeon: Garner Nash, DO;  Location: [facility];  Service: Pulmonary;;   IR THORACENTESIS ASP PLEURAL SPACE W/IMG GUIDE  09/15/2021   IRRIGATION AND DEBRIDEMENT SEBACEOUS CYST     TEE WITHOUT CARDIOVERSION N/A 09/14/2021   Procedure: TRANSESOPHAGEAL ECHOCARDIOGRAM (TEE);  Surgeon: Sanda Klein, MD;  Location: [facility];  Service: Cardiovascular;  Laterality: N/A;   VIDEO BRONCHOSCOPY WITH ENDOBRONCHIAL ULTRASOUND Bilateral 01/07/2021   Procedure: VIDEO BRONCHOSCOPY WITH ENDOBRONCHIAL ULTRASOUND;  Surgeon: Garner Nash, DO;  Location: [facility];  Service: Pulmonary;  Laterality: Bilateral;  possible need for radial ultrasound and fluoro      Allergies  Allergies  Allergen Reactions   Diclofenac Sodium Other (See Comments)    Kidneys were affected, was on Diclofenac 75mg  po BID (not topical) for Left shoulder pain,    History of Present Illness    87 y/o ? with a h/o prior tob abuse and non-small cell carcinoma of the lung (dx 01/2021).  He  was receiving chemotherapy as an outpt (6 cycles of paclitaxel and carboplatin).  In 07/2021, he was admtted to [facility] w/ cecal volvulus, which was treated surgically.  Post-op course was complicated by PE and Afib, apparently dx in November, and he was anticoagulated w/ lovenox in the outpt setting.  Unfortunately, following his November admission, he had progressive generalized weakness, prompting presentation to the [facility] ED on 12/2.  He was found to be anemic (hgb 7.4  Transfused 1u  and seen by GI), febrile (100), FOB +, hyponatremic (129).  CXR showed mod L pleural effusion.  Blood cultures returned 4/4 bottles w/ Staph aureus (MSSA).  CT chest w/ mod L, small R pl effusions w/ total atx or consolidation of LLL; fibrosis and ground-glass of the paramedian RUL (radiation fibrosis/pneumonitis); metastatic lesions of lateral R 5th rib and L scapular body; cor atherosclerosis.  CT abd/pelvis showed incidental contained aortic perforation at the Ao bifurctaion w/ stable periaortic hematoma, pelvic abscess, decreasing R subdiaphragmatic loculated gas collection, mild ascites, bilat pl effusions, cholelithiasis, and stable R iliofemoral DVT.  Vasc surgery was consulted and he subsequently underwent EVAR 12/4.  TEE 12/7 showed nl LV fxn w/ vegetation.  He was eventually d/c'd to rehab on 12/10 w/ plan for 4 wks of IV abx (beginning 12/2).  Pts wife present today for interview.  Pt has been rehabbing slowly.  Wife was concerned that since his dx of Afib in Nov, that he has never seen cardiology.  Pt denies any prior cardiac hx and up until his lung cancer dx in early 2022, was otw healthy.  His wife is concerned that he continues to desaturate when working w/ PT/OT.  Pt currently asymptomatic.  Inpatient Medications     budesonide  0.25 mg Nebulization BID   Chlorhexidine Gluconate Cloth  6 each Topical BID   dextromethorphan-guaiFENesin  1 tablet Oral BID   diclofenac  1 patch Transdermal BID   diltiazem  120 mg Oral Daily   enoxaparin (LOVENOX) injection  75 mg Subcutaneous BID   feeding supplement  237 mL Oral BID BM   folic acid  1 mg Oral Daily   gabapentin  300 mg Oral QHS   levalbuterol  0.63 mg Nebulization Q6H   mouth rinse  15 mL Mouth Rinse BID   metoprolol tartrate  50 mg Oral BID   multivitamin with minerals  1 tablet Oral Daily   Muscle Rub   Topical BID   oxyCODONE-acetaminophen  1 tablet Oral Q0600   pantoprazole  40 mg Oral Daily   rosuvastatin  5 mg Oral Daily    senna-docusate  2 tablet Oral QHS   sodium chloride flush  10-40 mL Intracatheter Q12H   sodium phosphate  1 enema Rectal Once    Family History    Family History  Problem Relation Age of Onset   Hypertension Other    He indicated that the status of his other is unknown.   Social History    Social History   Socioeconomic History   Marital status: Married    Spouse name: Not on file   Number of children: Not on file   Years of education: Not on file   Highest education level: Not on file  Occupational History   Not on file  Tobacco Use   Smoking status: Former    Years: 5.00    Types: Cigarettes    Quit date: 68    Years since quitting: 61.0   Smokeless tobacco: Never  Tobacco comments:    light/occasional smoker back in early 1960's  Vaping Use   Vaping Use: Never used  Substance and Sexual Activity   Alcohol use: Not Currently   Drug use: Never   Sexual activity: Not Currently  Other Topics Concern   Not on file  Social History Narrative   Not on file   Social Determinants of Health   Financial Resource Strain: Low Risk    Difficulty of Paying Living Expenses: Not hard at all  Food Insecurity: No Food Insecurity   Worried About Charity fundraiser in the Last Year: Never true   [place] in the Last Year: Never true  Transportation Needs: No Transportation Needs   Lack of Transportation (Medical): No   Lack of Transportation (Non-Medical): No  Physical Activity: Inactive   Days of Exercise per Week: 0 days   Minutes of Exercise per Session: 0 min  Stress: No Stress Concern Present   Feeling of Stress : Only a little  Social Connections: Not on file  Intimate Partner Violence: Not At Risk   Fear of Current or Ex-Partner: No   Emotionally Abused: No   Physically Abused: No   Sexually Abused: No     Review of Systems    General:  +++ generalized wkns - improving.  No chills, fever, night sweats or weight changes.  Cardiovascular:  No  chest pain, dyspnea on exertion, +++ bilat ankle/pedal edema, no orthopnea, palpitations, paroxysmal nocturnal dyspnea. Dermatological: No rash, lesions/masses Respiratory: No cough, dyspnea Urologic: No hematuria, dysuria Abdominal:   No nausea, vomiting, diarrhea, bright red blood per rectum, melena, or hematemesis Neurologic:  No visual changes, changes in mental status. All other systems reviewed and are otherwise negative except as noted above.  Physical Exam    Blood pressure 121/60, pulse 90, temperature 97.7 F (36.5 C), resp. rate 16, height 5\' 10"  (1.778 m), weight 74 kg, SpO2 96 %.  General: Pleasant, NAD Psych: Normal affect. Neuro: Alert and oriented X 3. Moves all extremities spontaneously. HEENT: Normal  Neck: Supple without bruits or JVD. Lungs:  Resp regular and unlabored, faint insp/exp wheezing, diminished breath sounds @ bilat bases. Heart: IR, IR, no s3, s4, or murmurs. Abdomen: Soft, non-tender, non-distended, BS + x 4.  Extremities: No clubbing, cyanosis. 1-2+ bilat ankle/pedal edema. DP/PT1+, Radials 2+ and equal bilaterally.  Labs      Lab Results  Component Value Date   WBC 6.0 10/03/2021   HGB 9.1 (L) 10/03/2021   HCT 29.6 (L) 10/03/2021   MCV 99.7 10/03/2021   PLT 259 10/03/2021    Recent Labs  Lab 10/03/21 0400  NA 136  K 2.7*  CL 92*  CO2 36*  BUN 12  CREATININE 0.55*  CALCIUM 8.5*  GLUCOSE 92     Radiology Studies    DG Chest 1 View  Result Date: 09/15/2021 CLINICAL DATA:  Post left thoracentesis.  History of lung cancer EXAM: CHEST  1 VIEW COMPARISON:  09/11/2021 FINDINGS: Decreasing left pleural effusion following thoracentesis. Continued airspace disease throughout much of the left lung. No pneumothorax. Volume loss on the left. Heart is upper limits normal in size. Mild vascular congestion. IMPRESSION: Decreasing left effusion following thoracentesis.  No pneumothorax. Continued volume loss on the left and diffuse left lung airspace  disease. Electronically Signed   By: Rolm Baptise M.D.   On: 09/15/2021 09:58   DG Chest 2 View  Result Date: 09/28/2021 CLINICAL DATA:  Shortness of breath.  EXAM: CHEST - 2 VIEW COMPARISON:  09/24/2021 FINDINGS: Left base collapse/consolidation with left parahilar airspace disease and left pleural effusion is similar to prior. Airspace opacity in the infrahilar right lung base is similar. Right PICC line again noted. The visualized bony structures of the thorax show no acute abnormality. IMPRESSION: No substantial change. Left base collapse/consolidation with left pleural effusion and medial right base airspace disease. Electronically Signed   By: Misty Stanley M.D.   On: 09/28/2021 10:10   DG Knee 1-2 Views Left  Result Date: 09/10/2021 CLINICAL DATA:  LEFT knee pain EXAM: LEFT KNEE - 1-2 VIEW COMPARISON:  None. FINDINGS: Osseous alignment is normal. Bone mineralization is normal. No fracture line or displaced fracture fragment. No acute-appearing cortical irregularity or osseous lesion. No appreciable degenerative change. No appreciable joint effusion. Vascular calcifications are seen within the popliteal fossa. IMPRESSION: 1. No acute findings. No osseous fracture or dislocation. No appreciable degenerative change. 2. Vascular calcifications. Electronically Signed   By: Franki Cabot M.D.   On: 09/10/2021 13:01   DG Abd 1 View  Result Date: 09/10/2021 CLINICAL DATA:  A male at age 58 presents with concern for distended abdomen and abdominal pain. EXAM: ABDOMEN - 1 VIEW COMPARISON:  CT of the abdomen and pelvis from August 24, 2021. FINDINGS: Dense LEFT lower lobe consolidation is similar to previous imaging and associated likely with pleural effusion. RIGHT lung bases clear. EKG leads project over the abdomen. Bowel distension is improved compared to the study of August 20, 2021. There is abundant stool in the rectum on the current exam. Still with mild gaseous distension of small bowel but  without substantial dilation of the colon. Signs of cholelithiasis in the RIGHT upper quadrant. On limited assessment there is no acute skeletal process. IMPRESSION: Bowel distension is improved compared to the study of August 20, 2021. Abundant stool in the rectum on the current exam, query fecal impaction. LEFT lower lobe consolidation and likely with pleural effusion similar to previous imaging. Electronically Signed   By: Zetta Bills M.D.   On: 09/10/2021 13:05   CT CHEST W CONTRAST  Addendum Date: 09/10/2021   ADDENDUM REPORT: 09/10/2021 15:24 ADDENDUM: Addendum is made to note the presence of a small air loculation in the upper abdomen about the liver. Although the patient had a percutaneous drainage procedure on 11/17, it would be unusual for air to persist for this length of time and abdominal viscus organ perforation is a concern. Recommend CT abdomen pelvis to further evaluate. These results were called by telephone at the time of interpretation on 09/10/2021 at 3:18 pm to Dr. Landis Gandy, who verbally acknowledged these results. Electronically Signed   By: Delanna Ahmadi M.D.   On: 09/10/2021 15:24   Result Date: 09/10/2021 CLINICAL DATA:  Possible sepsis, lung cancer with radiation EXAM: CT CHEST WITH CONTRAST TECHNIQUE: Multidetector CT imaging of the chest was performed during intravenous contrast administration. CONTRAST:  9mL OMNIPAQUE IOHEXOL 350 MG/ML SOLN COMPARISON:  CT chest angiogram, 08/15/2021 FINDINGS: Cardiovascular: Aortic atherosclerosis. Redemonstrated right lower lobe segmental subsegmental pulmonary embolism (series 2, image 94). 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 Normal heart size. Extensive left coronary artery calcifications. No pericardial effusion. Mediastinum/Nodes: No enlarged mediastinal, hilar, or axillary lymph nodes. Thyroid gland, trachea, and esophagus demonstrate no significant findings. Lungs/Pleura:  Moderate left, small right pleural effusions, slightly increased in volume compared to prior examination. Total atelectasis or consolidation of the left lower lobe, increased compared to  prior examination. Otherwise unchanged post treatment appearance of the perihilar left lung with extensive fibrosis and volume loss as well as some interlobular septal thickening and associated ground-glass (series 5, image 57), as well as fibrosis and ground-glass of the paramedian right upper lobe (series 5, image 53). Interval decrease in size of heterogeneous airspace disease and consolidation of the central right lower lobe, measuring 2.0 x 1.8 cm, previously more subsolid and appearance measuring 3.4 x 3.2 cm (series 5, image 105). Upper Abdomen: No acute abnormality. Musculoskeletal: No chest wall mass. Unchanged lytic osseous lesion of the lateral right fifth rib (series 5, image 79) and of the left scapular body (series 5, image 49). IMPRESSION: 1. Moderate left, small right pleural effusions, slightly increased in volume compared to prior examination. 2. Total atelectasis or consolidation of the left lower lobe, increased compared to prior examination. 3. Otherwise unchanged post treatment appearance of the perihilar left lung with extensive fibrosis and volume loss as well as some interlobular septal thickening and associated ground-glass. There is additional fibrosis and ground-glass of the paramedian right upper lobe. This constellation of findings is most consistent with developing radiation fibrosis with a component of persisting radiation pneumonitis. Superimposed infection would however be difficult to strictly exclude. 4. Expected interval evolution of a pulmonary infarction in the right lower lobe. There is residual right lower lobe embolus noted on this non tailored, non angiographic examination. 5. Unchanged osseous metastatic lesions of the lateral right fifth rib and of the left scapular body. 6. Coronary  artery disease. Aortic Atherosclerosis (ICD10-I70.0). Electronically Signed: By: Delanna Ahmadi M.D. On: 09/10/2021 14:26   CT ABDOMEN PELVIS W CONTRAST  Addendum Date: 09/10/2021   ADDENDUM REPORT: 09/10/2021 22:04 ADDENDUM: Interval development of multiple suspected small hepatic metastases as described above. These results were called by telephone at the time of interpretation on 09/10/2021 at 10:01 pm to provider Lovey Newcomer, who verbally acknowledged these results. Electronically Signed   By: Fidela Salisbury M.D.   On: 09/10/2021 22:04   Result Date: 09/10/2021 CLINICAL DATA:  Abdominal distension EXAM: CT ABDOMEN AND PELVIS WITH CONTRAST TECHNIQUE: Multidetector CT imaging of the abdomen and pelvis was performed using the standard protocol following bolus administration of intravenous contrast. CONTRAST:  43mL OMNIPAQUE IOHEXOL 350 MG/ML SOLN COMPARISON:  08/24/2021 FINDINGS: Lower chest: Focal consolidation within the central right lower lobe is again noted and demonstrates slight interval decrease in size better seen on accompanying CT examination of the chest performed earlier. Bilateral pleural effusions are present, left greater than right with complete collapse of the left lower lobe. Small pericardial effusion. Cardiac size within normal limits. Hepatobiliary: Scattered low-attenuation lesions within the liver are noted. While several of these lesions are compatible with simple cysts, at least 1 is enlarged since prior examination (right hepatic dome, axial image # 14/2) an 2 are new d (16 mm axial image # 18/2 and 11 mm, axial image # 20/2 within the pericaval region) suspicious for progressive metastatic disease in this patient with known metastatic lung cancer. Cholelithiasis without pericholecystic inflammatory change noted. No intra or extrahepatic biliary ductal dilation. Pancreas: Unremarkable Spleen: Unremarkable Adrenals/Urinary Tract: Adrenal glands are unremarkable. Kidneys are normal in  position. Right kidney is normal in size. Left kidney demonstrates interval resolution of previously noted severe hydronephrosis. Superimposed moderate to severe left renal cortical atrophy persists, asymmetrically more severe within the lower pole. Bladder is unremarkable save for mass effect at the bladder base by the hypertrophied central prostate gland. Stomach/Bowel: Small  focus of gas seen within the right subdiaphragmatic region is again noted and appears decreased in size since prior examination. Multiple additional foci of gas seen on prior examination appeared over resolved and this may simply represent the residua of the previous intra-abdominal process. Mild ascites persists. Stomach, small bowel, and large bowel are unremarkable save for mild sigmoid diverticulosis. Appendix normal. Rim calcified cystic collection within the mid abdomen appears slightly decreased in size measuring 5.1 x 6.8 cm. l rim enhancing loculated fluid collection compatible with a residual abscess is again seen with the in the deep pelvis demonstrating slight interval decrease in size measuring 2.6 x 4.0 cm at axial image # 72/2. No new intra-abdominal loculated fluid collections. Vascular/Lymphatic: There is a contained aortic leak at the aortic bifurcation with periaortic hematoma identified. The contained leak is best appreciated on axial image # 55/2. This is new since remote prior examination of 07/30/2021. Periaortic hematoma appears relatively stable since prior examination. Superimposed extensive aortoiliac atherosclerotic calcification. No pathologic adenopathy within the abdomen and pelvis. There is intraluminal filling defect identified within the right common femoral vein in keeping with acute DVT extending into the right common iliac vein, stable since prior examination Reproductive: Prostate gland is moderately enlarged. Seminal vesicles are unremarkable. Other: Lobulated soft tissue in the periumbilical region  appears stable since prior examination this is of unclear significance. Increasing diffuse body wall subcutaneous edema noted. Musculoskeletal: Lytic lesion within the right sacral ala again noted in keeping with osseous metastatic disease. No acute bone abnormality. IMPRESSION: Contained aortic perforation at the aortic bifurcation with stable periaortic hematoma. Interval resolution of right pericolic gutter abscess. Interval decrease in size of a deep pelvic abscess now measuring 2.6 x 4.0 cm. Decreasing right subdiaphragmatic loculated gas collection, possibly the residua of a previously noted intra-abdominal process. Mild superimposed ascites appears stable. Bilateral pleural effusions are stable, better assessed on prior CT examination. Cholelithiasis. Interval resolution of left hydronephrosis. Superimposed marked left renal atrophy again noted. Slight interval decrease in rim calcified cystic collection within the small bowel mesentery, of unclear significance. This may represent the sequela of prior treated disease, intervention, or a a chronic abscess or hematoma. Stable lobulated soft tissue within the anterior abdominal wall within the periumbilical region a, of unclear significance. This may be better assessed with dedicated sonography. Stable right iliofemoral DVT. Attempts are being made at this time to contact the managing clinician for direct verbal communication of these findings. Electronically Signed: By: Fidela Salisbury M.D. On: 09/10/2021 21:57   MR KNEE LEFT W WO CONTRAST  Result Date: 09/15/2021 CLINICAL DATA:  Staphylococcus bacteremia. Tenderness along the suprapatellar area. Left knee pain. EXAM: MRI OF THE LEFT KNEE WITHOUT AND WITH CONTRAST TECHNIQUE: Multiplanar, multisequence MR imaging of the left knee was performed both before and after administration of intravenous contrast. CONTRAST:  70mL GADAVIST GADOBUTROL 1 MMOL/ML IV SOLN COMPARISON:  Radiographs dated September 10, 2021  FINDINGS: The exam is limited due to motion. MENISCI Medial: Degenerative changes without evidence of acute tear Lateral: Degenerative changes without evidence of acute tear LIGAMENTS Cruciates: ACL and PCL are intact. Collaterals: Medial collateral ligament is intact. Lateral collateral ligament complex is intact. CARTILAGE Patellofemoral: Advanced articular cartilage thinning. No evidence of focal full-thickness defect. Medial: Advanced articular cartilage thinning without evidence of full-thickness defect. Lateral: Advanced articular cartilage thinning without evidence of full-thickness defect. JOINT: Small suprapatellar joint effusion. Normal Hoffa's fat-pad. No plical thickening. POPLITEAL FOSSA: Popliteus tendon is intact. No Baker's cyst. EXTENSOR MECHANISM:  Intact quadriceps tendon. Intact patellar tendon. Intact lateral patellar retinaculum. Intact medial patellar retinaculum. Intact MPFL. Lyons: No aggressive osseous lesion. No fracture or dislocation. Other: No fluid collection or hematoma. Generalized muscle atrophy. Mild subcutaneous soft tissue edema. IMPRESSION: 1. Small joint effusion, likely reactive. No abnormal enhancement or soft tissue abnormality concerning for septic arthritis. 2. Generalized articular cartilage thinning without evidence of full-thickness defect. No evidence of ligamentous injury. 3. The marrow signal is within normal limits. No evidence of fracture or osteonecrosis. Electronically Signed   By: Keane Police D.O.   On: 09/15/2021 16:49   DG CHEST PORT 1 VIEW  Result Date: 09/24/2021 CLINICAL DATA:  Shortness of breath, history of lung cancer on the left, initial encounter EXAM: PORTABLE CHEST 1 VIEW COMPARISON:  09/17/2021 FINDINGS: Persistent left-sided pleural effusion is noted as well as parenchymal density in the residual left upper lobe stable in appearance from the prior exam. Hyperinflation of right lung is seen. Increasing right basilar infiltrate is seen.  Right-sided PICC line is noted in the mid right atrium. No bony abnormality is seen. IMPRESSION: Stable left-sided pleural effusion as well as parenchymal density similar to that seen on prior plain film. Increasing right basilar infiltrate is seen. Electronically Signed   By: Inez Catalina M.D.   On: 09/24/2021 19:54   DG CHEST PORT 1 VIEW  Result Date: 09/17/2021 CLINICAL DATA:  Weakness EXAM: PORTABLE CHEST 1 VIEW COMPARISON:  09/15/2021 FINDINGS: Moderate size left-sided pleural effusion, stable to slightly increased from prior. Airspace opacity persists within the left perihilar region and left lung base. Similar right infrahilar and right basilar opacities. No pneumothorax. Stable cardiomegaly with aortic atherosclerosis. Interval placement of right-sided PICC line with distal tip terminating at the level of the superior cavoatrial junction. IMPRESSION: 1. Interval placement of right-sided PICC line with distal tip terminating at the level of the superior cavoatrial junction. 2. Moderate size left-sided pleural effusion, stable to slightly increased from prior. 3. Persistent bilateral airspace opacities, left greater than right. Electronically Signed   By: Davina Poke D.O.   On: 09/17/2021 12:26   DG Chest Port 1 View  Result Date: 09/11/2021 CLINICAL DATA:  Status post abdominal aortic aneurysm repair, dyspnea EXAM: PORTABLE CHEST 1 VIEW COMPARISON:  09/09/2021 FINDINGS: Large left pleural effusion is present, enlarged since prior examination, with core aggressive volume loss involving the residual aerated left upper lobe. Complete collapse of the left lower lobe again noted. Superimposed focal opacity within the left upper lobe has enlarged in the interval possibly representing a a superimposed pneumonic infiltrate. Right lung is clear. No pneumothorax or pleural effusion on the right. Cardiac size is mildly enlarged, unchanged. No acute bone abnormality. IMPRESSION: Progressive consolidation  within the residual aerated left upper lobe. Superimposed pneumonic infiltrate is not excluded. Enlarging left pleural effusion with collapse of the left lower lobe and progressive compressive atelectasis of the left upper lobe. Stable cardiomegaly Electronically Signed   By: Fidela Salisbury M.D.   On: 09/11/2021 20:30   DG Chest Port 1 View  Result Date: 09/09/2021 CLINICAL DATA:  Concern for sepsis EXAM: PORTABLE CHEST 1 VIEW COMPARISON:  08/19/2019 FINDINGS: normal cardiac silhouette. Moderate LEFT effusion is new from prior. LEFT lung base not well evaluated. RIGHT lung clear. IMPRESSION: New moderate size LEFT effusion. Cannot exclude LEFT lower lobe atelectasis or infiltrate. Electronically Signed   By: Suzy Bouchard M.D.   On: 09/09/2021 13:43   DG Shoulder Left  Result Date: 09/20/2021 CLINICAL DATA:  Shoulder pain EXAM: LEFT SHOULDER - 2+ VIEW COMPARISON:  Chest CT 08/15/2021 FINDINGS: AC joint is intact. Probable loculated left pleural effusion. No fracture or malalignment. Lytic lesion involving the inferior scapula. IMPRESSION: 1. Lytic lesion involving left scapula consistent with metastatic disease, present on CT from November 2. No acute osseous abnormality left shoulder 3. Moderate probably loculated left pleural effusion Electronically Signed   By: Donavan Foil M.D.   On: 09/20/2021 17:56   IR THORACENTESIS ASP PLEURAL SPACE W/IMG GUIDE  Result Date: 09/15/2021 INDICATION: Patient with a history of lung cancer and atrial fibrillation presents today with a left pleural effusion. Interventional radiology asked to perform a diagnostic and therapeutic thoracentesis. EXAM: ULTRASOUND GUIDED THORACENTESIS MEDICATIONS: 1% lidocaine 10 mL COMPLICATIONS: None immediate. PROCEDURE: An ultrasound guided thoracentesis was thoroughly discussed with the patient and questions answered. The benefits, risks, alternatives and complications were also discussed. The patient understands and wishes to  proceed with the procedure. Written consent was obtained. Ultrasound was performed to localize and mark an adequate pocket of fluid in the left chest. The area was then prepped and draped in the normal sterile fashion. 1% Lidocaine was used for local anesthesia. Under ultrasound guidance a 6 Fr Safe-T-Centesis catheter was introduced. Thoracentesis was performed. The procedure was stopped early due to patient discomfort. The catheter was removed and a dressing applied. FINDINGS: A total of approximately 800 mL of clear yellow fluid was removed. Samples were sent to the laboratory as requested by the clinical team. IMPRESSION: Successful ultrasound guided left thoracentesis yielding 800 mL of pleural fluid. Read by: Soyla Dryer, NP Electronically Signed   By: Jacqulynn Cadet M.D.   On: 09/15/2021 10:41    ECG & Cardiac Imaging    12/2 Afib RVR 135, leftward axis - personally reviewed. 12/21 Afib, 81, no acute ST/T changes - personally reviewed  Assessment & Plan    1.  Persistent Afib:  Dx in 08/2021 in the setting of PE and RLE DVT.  Pt appears to have been in Afib since then.  Not prev eval by cardiology in inpt/outpt setting, though rates reasonably well-controlled on dilt 120 qd and metoprolol 50 bid (rates 70's to 100).  Pt is asymptomatic.  CHA2DS2VASc = 3 (age, CAD noted on CTA chest).  He has been anticoagulated w/ lovenox 1mg /kg bid in the setting of recent contained rupture of the Ao w/ EVAR.  TEE earlier this admission showed nl EF and nl LA size.  Per notes, consideration to be given to switching to oral anticoagulant following repeat aortic imaging on the outpt side by vasc surgery.  At this time, we rec continuing current doses of meds. Given nl LA size and presumption that Afib started in November, we might consider DCCV at some point down the road, provided that he is able to begin and tolerate [place], though w/ multiple comorbidities and asymptomatic state, a more conservative approach  may be more appropriate.  2.  Hypoxia/L pleural effusion:  Pt noted to desaturate periodically, req replacement of O2 via [place].  Recent CXR on 12/21 w/ L basilar collapse/consolidation w/ L pleural effusion and medial R base airspace dzs.  He is s/p thoracentesis w/ 842ml removed on 12/8, w/ sl improvement noted on 12/8 f/u cxr.  It appears that effusion has since worsened.  I will order a 2 view CXR.  Consider repeat thoracentesis if appropriate.  3.  MSSA bacteremia:  Abx per IM/ID.  4.  Metastatic non-small cell carcinoma of the lung:  Outpt heme/onc f/u.  5.  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  Remains on lovenox for now.  6.  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  Followed by vasc surgery w/ plan for repeat outpt imaging in January.  7.  Hypokalemia: K 2.7 this AM. Supplementation ordered.  8.  Lower ext edema:  Nl EF by echo.  HR/BP stable.  Suspect edema 2/2 poor nutrition.  Albumin 1.6 12/3.  Wearing TEDS.  9.  Normocytic anemia:  stable.  Signed, Murray Hodgkins, NP 10/03/2021, 3:41 PM  For questions or updates, please contact   Please consult [email or web address] for contact info under Cardiology/STEMI.    I have seen and examined the patient along with Murray Hodgkins, NP.  I have reviewed the chart, notes and new data.  I agree with PA/NP's note.    Key new complaints: He is unaware of atrial fibrillation at rest or with activity.  He is severely deconditioned and becomes easily short of breath with physical therapy, sometimes interrupted due to oxygen desaturation.  He does not have angina pectoris. Key examination changes: Regular rhythm, well rate controlled, no pericardial rub, no murmurs.  He has both inspiratory stridor and expiratory wheezing overlying his right lung, especially the right lower lobe.  He does not have peripheral edema or jugular venous distention.  Normal distal pulses and warm extremities. Key new findings / data:  Atrial fibrillation with good ventricular rate control on ECG tracings.  PLAN: We do not have all the records from his initial hospitalization in [place] for surgery in late October.  Apparently he was in normal sinus rhythm at that time and was found to be in atrial fibrillation when he returned with DVT/PE the following month.  It appears that he has been in uninterrupted atrial fibrillation for almost 2 months now.  He is tolerating the arrhythmia quite well.  Rate is well controlled.  No bleeding on enoxaparin injections for stroke prevention (as well as for recent DVT/PE).  He does not have much in the way of structural heart disease, just some age-related minor valvular abnormalities.  The left atrium was not dilated and the left atrial appendage contractile function was excellent at the time of his TEE.  His overall prognosis is primarily impacted by his age and widespread metastatic disease due to non-small cell carcinoma of the lung.  I think we should continue with the same rate control strategy for the time being.  When the vascular surgeon believes it is safe, transition from subcutaneous enoxaparin to oral anticoagulant such as Eliquis 5 mg twice daily.  If he tolerates that well (and if at any point the atrial fibrillation becomes symptomatic) we can consider cardioversion.  He has been anticoagulated without interruption since onset of arrhythmia during his previous hospitalization in November.  Sanda Klein, MD, [place] [phone] 10/03/2021, 4:24 PM '

## 2021-10-03 NOTE — Progress Notes (Signed)
Physical Therapy Session Note  Patient Details  Name: Leon Lyons MRN: [id number] Date of Birth: October 10, 1932  Today's Date: 10/03/2021 PT Individual Time: 1017-1115 PT Individual Time Calculation (min): 58 min   Short Term Goals: Week 2:  PT Short Term Goal 1 (Week 2): Pt will perform bed mobility consistently with minA. PT Short Term Goal 2 (Week 2): Pt will perform sit to stand consistently with minA. PT Short Term Goal 3 (Week 2): Pt will perform bed to chair transfer consistently with minA. PT Short Term Goal 4 (Week 2): Pt will ambulate x150' with minA and LRAD.  Skilled Therapeutic Interventions/Progress Updates:     Pt received seated in [facility] and agrees to therapy. Complains of nausea. Pt alerts RN who provides zofran for pt. Pt also reporting low back pain, R>L. Number not provided. Pt provides rest breaks and mobility to manage pain.  WC transport downstairs for energy conservation. Pt requires modA for sit to stand from [facility] with increased difficulty with forward weight shift relative to previous session. Stand step transfer high-low mat with CGA and RW. PT elevates mat to level of pt's home bed, and pt performs sit to stand with close supervision and cues for initiation and body mechanics. Pt reporting increase in back pain so transfers back to [facility] for lumbar support.  Pt transfers to Nustep with minA for sit to stand and improved sequencing. PT provides moist heat back while performing activity to alleviate low back pain. Pt completes nustep for strength and endurance training, as well as reciprocal motion to reflexively relax low back extensor muscle groups for pain relief. Pt completes at workload of 4 in 2:00 increments, taking rest breaks in between. Pt completes total of 10:00 on Nustep. Following pt reports slight improvement in back pain symptoms. MinA for stand pivot from Nustep>WC>bed, with cues for body mechanics and sequencing. Sit to supine with cues for positioning. Left  supine with alarm intact and all needs within reach.  Therapy Documentation Precautions:  Precautions Precautions: Fall, Other (comment) Precaution Comments: monitor BP, HR, and SpO2; possible L shoulder RC injury ? Restrictions Weight Bearing Restrictions: No    Therapy/Group: Individual Therapy  Breck Coons, PT, DPT 10/03/2021, 4:04 PM

## 2021-10-03 NOTE — Progress Notes (Signed)
Potassium 2.7. Give potassium 28meq X 2 today. Recheck bmet 12/27. Encourage po intake as well

## 2021-10-03 NOTE — Progress Notes (Signed)
Received consult for Afib management. On further chart review, patient has been recently sen by [facility] during inpatient hospitalization. I have notified [facility] re: the patient. Please contact on call provider [facility] for any future cardiac questions.   Nigel Mormon, MD Pager: [phone] Office: [phone]

## 2021-10-03 NOTE — Progress Notes (Signed)
Occupational Therapy Session Note  Patient Details  Name: Leon Lyons MRN: [id number] Date of Birth: Jun 04, 1933  Today's Date: 10/03/2021 OT Individual Time: 7471-5953 OT Individual Time Calculation (min): 45 min    Short Term Goals: Week 1:  OT Short Term Goal 1 (Week 1): Pt will perform sit to stand with mod A 50% of time without the STEDY for clothing mangement OT Short Term Goal 1 - Progress (Week 1): Met OT Short Term Goal 2 (Week 1): Pt will be able to transfer with mod A 50% of the time to the toilet/ BSC OT Short Term Goal 2 - Progress (Week 1): Met OT Short Term Goal 3 (Week 1): PT will be able to thread LB clothing with min A with AE prn OT Short Term Goal 3 - Progress (Week 1): Progressing toward goal  Skilled Therapeutic Interventions/Progress Updates:  Patient supine in bed with head of bed elevated and his wife rubbing his left arm due to his complaints of BACK pain.  Patient stated he had taken pain meds about 20 minutes prior to offer of this OT session.   Wife agreed and stated she was trying to keep him awake for therapy because the pain meds cause him to get sleepy.  He concurred to participate in OT therapy and complete several sit to stand with technique instruction education (I.e scoot forward to front of surface in order to provide more leverage for successful standing from seated positions).  He was able to complete sit to stand in prep several times for activiites with CGA from edge of bed level at the height of his home bed and other surfaces.  He did not require the steady for sit to stand and completed toilet transfer (elevated seat) into his bathroom via RW with CGA    For sit to stand, he incorporated 1,2,3, then stand up method.      Patient intended to try shower transfer in his room; however, he stated his back hurt at an8/10 level.  So, he did not tolerate attempting to step over the threshold into the shower.   Patient required cues and assist for  comforable w/c positionoing at the end of the session.  RN came in and offered meds at the end the session.   Patient was left in the care of his nurse and wife.  Continue plan of care.     Therapy Documentation Precautions:  Precautions Precautions: Fall, Other (comment) Precaution Comments: monitor BP, HR, and SpO2; possible L shoulder RC injury ? Restrictions Weight Bearing Restrictions: No General:   Pain:    Therapy/Group: Individual Therapy  Alfredia Ferguson [facility] 10/03/2021, 2:53 PM

## 2021-10-03 NOTE — Progress Notes (Signed)
Occupational Therapy Session Note  Patient Details  Name: Leon Lyons MRN: [id number] Date of Birth: [birth year]  Today's Date: 10/03/2021 OT Individual Time: 1062-6948 OT Individual Time Calculation (min): 40 min    Short Term Goals: Week 2:  OT Short Term Goal 1 (Week 2): PT will be able to thread LB clothing with min A with AE prn OT Short Term Goal 2 (Week 2): Pt will consistently stand with mod A at the sink in preparation for BADL tasks OT Short Term Goal 3 (Week 2): Patient will demonstrate R elbow ROM for edema management with min cues OT Short Term Goal 4 (Week 2): Patient will complete 1 step of toileting task  Skilled Therapeutic Interventions/Progress Updates:  Skilled OT intervention completed with focus on anterior pelvic tilt and weight shifting needed for functional sit > stands. Pt received supine in bed, asleep with wife in room. Pt agreeable to session. Pt wearing [place], on 2L O2, with 92% sat, and remained on 2L throughout rest of session. Pt rolled > L using bed rail with supervision, then min A needed for sitting up to sit. Able to scoot forward with tactile cues and CGA. Attempted standing using RW, with pt expressing difficulty. Education provided on prepping to stand, with feet on ground, wide BOS, anterior leans and loading weight into BLEs. On 2nd attempt, pt able to stand using RW with min A, then stand pivot with CGA to w/c. Transported with total A to [employer], with wife present. Using 1 pound dowel, pt completed x10 ball taps to promote anterior leaning needed for standing. Pt only able to complete at low level due to L shoulder limitations. Pt using R arm only, placed 20 connect 4 pieces from cup in L hand to therapist cup in front of pt, to further promote anterior weight shift. Pt demonstrating increased ability with forward lean when using RUE only. Pt with habit of holding his breath throughout task, with education provided to pt and wife on pursed  lip breathing technique for increased oxygen consumption. Pt left seated in w/c, requesting to eat lunch, with wife at pt's side, with belt alarm on, and all needs in reach at end of session.   Therapy Documentation Precautions:  Precautions Precautions: Fall, Other (comment) Precaution Comments: monitor BP, HR, and SpO2; possible L shoulder RC injury ? Restrictions Weight Bearing Restrictions: No  Pain: No c/o pain   Therapy/Group: Individual Therapy  Kiyan Burmester E Aleshka Corney 10/03/2021, 7:45 AM

## 2021-10-03 NOTE — Progress Notes (Signed)
Occupational Therapy Session Note ° °Patient Details  °Name: Leon Lyons °MRN: [id number] °Date of Birth: 11/15/1932 ° °Today's Date: 10/03/2021 °OT Individual Time: 0705-0800 °OT Individual Time Calculation (min): 55 min  ° ° °Short Term Goals: °Week 2:  OT Short Term Goal 1 (Week 2): PT will be able to thread LB clothing with min A with AE prn °OT Short Term Goal 2 (Week 2): Pt will consistently stand with mod A at the sink in preparation for BADL tasks °OT Short Term Goal 3 (Week 2): Patient will demonstrate R elbow ROM for edema management with min cues °OT Short Term Goal 4 (Week 2): Patient will complete 1 step of toileting task ° °Skilled Therapeutic Interventions/Progress Updates:  °Patient met lying supine in bed in agreement with OT treatment session. On 2L O2 at start and at conclusion of treatment session. SpO2 93% seated EOB and HR 108bpm. 3/10 pain reported at rest increasing with movement in low back. Patient declined bathing reporting "washing up" prior to bed last night. Patient able to roll to L without external assist. Min A at trunk with HOB slightly elevated to sit upright. Education on use of reacher to don LB clothing. Patient hesitant at first but eventually able to thread BLE through pants seated EOB with increased time and frequent rest breaks. Patient then able to don button up shirt with set-up assist. Assist required to button 1st button but then patient able to button the rest of his shirt with increased time. May benefit from button hook at time of d/c to increase independence and decrease need for external assist. Continued reacher training with patient utilizing device to doff socks seated EOB requiring increased cueing for technique and increased time. Patient then utilized reacher to position shoes in prep to don footwear. Assist to maintain position of shoe while slipping foot in but patient able to secure velcro straps with reacher. Mod A for sit to stand from EOB and Min A  for stand-pivot to wc on L with RW. 2/3 grooming tasks in sitting with set-up assist. Session concluded with patient seated in wc with call bell within reach, chair alarm activated and all needs met.  ° °Therapy Documentation °Precautions:  °Precautions °Precautions: Fall, Other (comment) °Precaution Comments: monitor BP, HR, and SpO2; possible L shoulder RC injury ? °Restrictions °Weight Bearing Restrictions: No °General: °  ° °Therapy/Group: Individual Therapy ° °Destanae R Howerton-Davis °10/03/2021, 6:55 AM °

## 2021-10-04 ENCOUNTER — Telehealth: Payer: Self-pay

## 2021-10-04 DIAGNOSIS — R5381 Other malaise: Secondary | ICD-10-CM | POA: Diagnosis not present

## 2021-10-04 LAB — BASIC METABOLIC PANEL
Anion gap: 6 (ref 5–15)
BUN: 13 mg/dL (ref 8–23)
CO2: 36 mmol/L — ABNORMAL HIGH (ref 22–32)
Calcium: 8.7 mg/dL — ABNORMAL LOW (ref 8.9–10.3)
Chloride: 92 mmol/L — ABNORMAL LOW (ref 98–111)
Creatinine, Ser: 0.51 mg/dL — ABNORMAL LOW (ref 0.61–1.24)
GFR, Estimated: >60 mL/min (ref >60)
Glucose, Bld: 99 mg/dL (ref 70–99)
Potassium: 3.7 mmol/L (ref 3.5–5.1)
Sodium: 134 mmol/L — ABNORMAL LOW (ref 135–145)

## 2021-10-04 MED ORDER — ENOXAPARIN SODIUM 80 MG/0.8ML IJ SOSY
75.0000 mg | PREFILLED_SYRINGE | Freq: Two times a day (BID) | INTRAMUSCULAR | Status: DC
Start: 2021-10-05 — End: 2021-10-11
  Administered 2021-10-05 – 2021-10-11 (×12): 75 mg via SUBCUTANEOUS
  Filled 2021-10-04 (×12): qty 0.8

## 2021-10-04 MED ORDER — OXYCODONE-ACETAMINOPHEN 5-325 MG PO TABS
1.0000 | ORAL_TABLET | Freq: Two times a day (BID) | ORAL | Status: DC
  Administered 2021-10-05 – 2021-10-10 (×12): 1 via ORAL
  Filled 2021-10-04 (×12): qty 1

## 2021-10-04 NOTE — Progress Notes (Signed)
Physical Therapy Session Note  Patient Details  Name: Leon Lyons MRN: [id number] Date of Birth: [birth year]  Today's Date: 10/04/2021 PT Missed Time: 110 Minutes Missed Time Reason: Patient fatigue;Other (Comment) (Nausea)  Short Term Goals: Week 2:  PT Short Term Goal 1 (Week 2): Pt will perform bed mobility consistently with minA. PT Short Term Goal 2 (Week 2): Pt will perform sit to stand consistently with minA. PT Short Term Goal 3 (Week 2): Pt will perform bed to chair transfer consistently with minA. PT Short Term Goal 4 (Week 2): Pt will ambulate x150' with minA and LRAD.  Skilled Therapeutic Interventions/Progress Updates:     Pt reports feeling nauseous and fatigued. Requests to take a rest from therapy this PM. PT will follow up as appropriate.  Therapy Documentation Precautions:  Precautions Precautions: Fall, Other (comment) Precaution Comments: monitor BP, HR, and SpO2; possible L shoulder RC injury ? Restrictions Weight Bearing Restrictions: No   Therapy/Group: Individual Therapy  Breck Coons, PT, DPT 10/04/2021, 2:46 PM

## 2021-10-04 NOTE — Progress Notes (Addendum)
Progress Note  Patient Name: Leon Lyons Date of Encounter: 10/04/2021  [facility] Cardiologist: Sanda Klein, MD   Subjective   Denies any significant SOB or chest pain  Inpatient Medications    Scheduled Meds:  budesonide  0.25 mg Nebulization BID   Chlorhexidine Gluconate Cloth  6 each Topical BID   dextromethorphan-guaiFENesin  1 tablet Oral BID   diclofenac  1 patch Transdermal BID   diltiazem  120 mg Oral Daily   enoxaparin (LOVENOX) injection  75 mg Subcutaneous BID   feeding supplement  237 mL Oral BID BM   folic acid  1 mg Oral Daily   gabapentin  300 mg Oral QHS   levalbuterol  0.63 mg Nebulization Q6H   mouth rinse  15 mL Mouth Rinse BID   metoprolol tartrate  50 mg Oral BID   multivitamin with minerals  1 tablet Oral Daily   Muscle Rub   Topical BID   oxyCODONE-acetaminophen  1 tablet Oral Q0600   pantoprazole  40 mg Oral Daily   rosuvastatin  5 mg Oral Daily   senna-docusate  2 tablet Oral QHS   sodium chloride flush  10-40 mL Intracatheter Q12H   sodium phosphate  1 enema Rectal Once   Continuous Infusions:  sodium chloride 10 mL/hr at 10/02/21 1509    ceFAZolin (ANCEF) IV 2 g (10/04/21 0559)   PRN Meds: sodium chloride, acetaminophen **OR** acetaminophen, alum & mag hydroxide-simeth, bisacodyl, cyclobenzaprine, guaiFENesin-dextromethorphan, heparin lock flush, levalbuterol, ondansetron **OR** ondansetron (ZOFRAN) IV, oxyCODONE-acetaminophen, phenol, polyethylene glycol, sodium chloride, sodium chloride flush, sodium phosphate   Vital Signs    Vitals:   10/03/21 2116 10/04/21 0212 10/04/21 0556 10/04/21 0901  BP:   (!) 134/59   Pulse:   70 (!) 102  Resp:   16 18  Temp:   (!) 97.5 F (36.4 C)   TempSrc:      SpO2: 93% 93% 96% 93%  Weight:      Height:        Intake/Output Summary (Last 24 hours) at 10/04/2021 0941 Last data filed at 10/03/2021 2340 Gross per 24 hour  Intake 120 ml  Output 550 ml  Net -430 ml   Last 3 Weights  09/24/2021 09/23/2021 09/17/2021  Weight (lbs) 163 lb 2.3 oz 159 lb 9.8 oz 175 lb 4.3 oz  Weight (kg) 74 kg 72.4 kg 79.5 kg      Telemetry    Not on telemetry - Personally Reviewed  ECG    Rate controlled atrial fibrillation - Personally Reviewed  Physical Exam   GEN: No acute distress.   Neck: No JVD Cardiac: irregular irregular, no murmurs, rubs, or gallops.  Respiratory: Clear to auscultation bilaterally. GI: Soft, nontender, non-distended  MS: No edema; No deformity. Neuro:  Nonfocal  Psych: Normal affect   Labs    High Sensitivity Troponin:  No results for input(s): TROPONINIHS in the last 720 hours.   Chemistry Recent Labs  Lab 10/03/21 0400 10/04/21 0337  NA 136 134*  K 2.7* 3.7  CL 92* 92*  CO2 36* 36*  GLUCOSE 92 99  BUN 12 13  CREATININE 0.55* 0.51*  CALCIUM 8.5* 8.7*  GFRNONAA >60 >60  ANIONGAP 8 6    Lipids No results for input(s): CHOL, TRIG, HDL, LABVLDL, LDLCALC, CHOLHDL in the last 168 hours.  Hematology Recent Labs  Lab 09/30/21 0450 10/03/21 0400  WBC 5.4 6.0  RBC 2.92* 2.97*  HGB 8.9* 9.1*  HCT 29.6* 29.6*  MCV  101.4* 99.7  MCH 30.5 30.6  MCHC 30.1 30.7  RDW 17.3* 17.1*  PLT 272 259   Thyroid No results for input(s): TSH, FREET4 in the last 168 hours.  BNPNo results for input(s): BNP, PROBNP in the last 168 hours.  DDimer No results for input(s): DDIMER in the last 168 hours.   Radiology    DG Chest 2 View  Result Date: 10/03/2021 CLINICAL DATA:  Hypoxia pleural effusion EXAM: CHEST - 2 VIEW COMPARISON:  09/28/2021, CT 09/10/2021 FINDINGS: Near complete opacification of the left thorax likely due to progressed pleural effusion and airspace disease. Mild shift of mediastinal contents to the left suggesting some volume loss. Small right-sided effusion slightly increased. Cardiomegaly with vascular congestion and probable mild edema. IMPRESSION: Worsening bilateral effusions and left airspace disease, now with near complete white out  left thorax, there is a small amount of aerated lung at the left apex. Cardiomegaly with vascular congestion and probable mild edema. Electronically Signed   By: Donavan Foil M.D.   On: 10/03/2021 18:56    Cardiac Studies   TEE 09/14/2021  1. Left ventricular ejection fraction, by estimation, is 60 to 65%. The  left ventricle has normal function. The left ventricle has no regional  wall motion abnormalities.   2. Right ventricular systolic function is normal. The right ventricular  size is normal.   3. No left atrial/left atrial appendage thrombus was detected. The LAA  emptying velocity was 70 cm/s.   4. Large pleural effusion in the left lateral region.   5. The mitral valve is myxomatous. Trivial mitral valve regurgitation. No  evidence of mitral stenosis. There is mild late systolic prolapse of both  leaflets of the mitral valve.   6. Tricuspid valve regurgitation is mild to moderate.   7. The aortic valve is tricuspid. There is mild calcification of the  aortic valve. There is mild thickening of the aortic valve. Aortic valve  regurgitation is mild. Aortic valve sclerosis/calcification is present,  without any evidence of aortic  stenosis.   8. There is Severe (Grade IV) plaque involving the aortic arch and  descending aorta.   9. The inferior vena cava is normal in size with greater than 50%  respiratory variability, suggesting right atrial pressure of 3 mmHg.   Conclusion(s)/Recommendation(s): No evidence of vegetation/infective  endocarditis on this transesophageael echocardiogram. Consider left  thoracentesis as part of further evaluation for cause of bacteremia.   Patient Profile     87 y.o. 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  Recent admission at [facility] with cecal valvulus treated surgically, post op course complicated by PE and afib. Started on anticoagulation, came back on 12/2 with severe anemia, +FOB, fever and hyponatremia. Blood culture positive for MSSA. CT shows moderate L pleural effusion, metastatic lesion in R 5th rib and L scapular body. CT abd/eplvis showed incidental finding of contained aortic perforation at aortic bifurcation with stable periaortic hematoma, pelvic abscess and mild ascites, stable R iliofemoral DVT. Vascular surgery consulted and patient underwent EVAR 12/4. TEE 12/7 showed no vegetation. Discharged to rehab with 4 weeks of IV abx starting 12/2. During inpatient rehab, wife concerned that he has never seen cardiology since diagnosis of afib in Nov. Cardiology consulted for management of afib which is rate controlled.   Assessment &  Plan    Persistent atrial fibrillation  - has been in persistent afib for 2 month. Rate is well controlled. Patient is also tolerating afib ok  - currently on Lovenox, will defer to vascular surgery to decide when he can transition to oral Eliquis 5mg  BID.   - No urgent need for DCCV. Continue metoprolol tartrate.   Large left pleural effusion: CXR yesterday shows complete white out of the left side, unclear if related to his metastatic lung cancer and low albumin. Question if left luing can be tapped with thoracentesis. Consider getting PCCM on board.  MSSA bacteremia: on IV cefazolin  Metastatic non-small cell carcinoma of lung  PE/RLE DVT: diagnosed in Nov. 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   LE edema: secondary of hypoalbuminemia      For questions or updates, please contact [place] Please consult [email or web address] for contact info under        Signed, Almyra Deforest, [place]  10/04/2021, 9:41 AM    Attending Note:   The patient was seen and examined.  Agree with  assessment and plan as noted above.  Changes made to the above note as needed.  Patient seen and independently examined with Almyra Deforest, PA .   We discussed all aspects of the encounter. I agree with the assessment and plan as stated above.    Atrial fib:   currently on Lovenox.   Asymptomatic.  Cont metoprolol for rate control  2.   Large L pleural effusion:   I would suggest getting pulmonary or [facility] hospitalist team involved in the care of this patient   [facility] will sign off.   Medication Recommendations:  cont supportive care, suggest chaning Lovenox to Eliquis 5 po BID if vascular surgery gives the OK  Other recommendations (labs, testing, etc):   Follow up as an outpatient:  with Dr. Sallyanne Kuster    I have spent a total of 40 minutes with patient reviewing hospital  notes , telemetry, EKGs, labs and examining patient as well as establishing an assessment and plan that was discussed with the patient.  > 50% of time was spent in direct patient care.    Thayer Headings, Brooke Bonito., MD, [facility] 10/04/2021, 9:57 AM [address]  [place] Pager [phone]

## 2021-10-04 NOTE — Progress Notes (Signed)
PROGRESS NOTE   Subjective/Complaints: Up in chair. Wife helping him with breakfast. Feels well  ROS: Patient denies fever, rash, sore throat, blurred vision, nausea, vomiting, diarrhea,   chest pain, joint or back pain, headache, or mood change.    Objective:   DG Chest 2 View  Result Date: 10/03/2021 CLINICAL DATA:  Hypoxia pleural effusion EXAM: CHEST - 2 VIEW COMPARISON:  09/28/2021, CT 09/10/2021 FINDINGS: Near complete opacification of the left thorax likely due to progressed pleural effusion and airspace disease. Mild shift of mediastinal contents to the left suggesting some volume loss. Small right-sided effusion slightly increased. Cardiomegaly with vascular congestion and probable mild edema. IMPRESSION: Worsening bilateral effusions and left airspace disease, now with near complete white out left thorax, there is a small amount of aerated lung at the left apex. Cardiomegaly with vascular congestion and probable mild edema. Electronically Signed   By: Donavan Foil M.D.   On: 10/03/2021 18:56   Recent Labs    10/03/21 0400  WBC 6.0  HGB 9.1*  HCT 29.6*  PLT 259    Recent Labs    10/03/21 0400 10/04/21 0337  NA 136 134*  K 2.7* 3.7  CL 92* 92*  CO2 36* 36*  GLUCOSE 92 99  BUN 12 13  CREATININE 0.55* 0.51*  CALCIUM 8.5* 8.7*     Intake/Output Summary (Last 24 hours) at 10/04/2021 1023 Last data filed at 10/03/2021 2340 Gross per 24 hour  Intake 120 ml  Output 550 ml  Net -430 ml        Physical Exam: Vital Signs Blood pressure (!) 134/59, pulse (!) 102, temperature (!) 97.5 F (36.4 C), resp. rate 18, height 5\' 10"  (1.778 m), weight 74 kg, SpO2 93 %.  Constitutional: No distress . Vital signs reviewed. Falls in and out of sleep easily but interactive while awake HEENT: NCAT, EOMI, oral membranes moist Neck: supple Cardiovascular: RRR without murmur. No JVD    Respiratory/Chest: scattered  rhonchi, basilar sounds, asynchronous respirations GI/Abdomen: BS +, non-tender, non-distended Ext: no clubbing, cyanosis  Psych: pleasant and cooperative  Musculoskeletal:     low back pain. Pain left scapula.    left scapular tenderness. LE edema improvedr, trace at left elbow Neurological:     Comments: Intact to light touch in all 4 extremities B/L  CN's intact on exam. Good insight and awareness. Ox3- appropriate   RUE- 5-/5 in deltoid, biceps, triceps, WE, grip and FA LUE- deltoid 2-/5; weak vs pain; Biceps 4-/5; Triceps 3-/5; WE 4-/5 grip 4-/5; FA 4-/5 RLE- 4+/5 in HF, KE, KF DF and PF LLE- Proximal 3to 3+5; distal 4-/5--stable appearance Skin:    General: Skin is warm and dry.     groin and abdominal wounds are CDI       Assessment/Plan: 1. Functional deficits which require 3+ hours per day of interdisciplinary therapy in a comprehensive inpatient rehab setting. Physiatrist is providing close team supervision and 24 hour management of active medical problems listed below. Physiatrist and rehab team continue to assess barriers to discharge/monitor patient progress toward functional and medical goals  Care Tool:  Bathing    Body parts bathed by patient: Right arm,  Left arm, Chest, Face, Abdomen, Front perineal area, Right upper leg, Left upper leg, Right lower leg, Left lower leg, Buttocks   Body parts bathed by helper: Abdomen, Front perineal area, Buttocks, Right upper leg, Left upper leg, Right lower leg, Left lower leg     Bathing assist Assist Level: Minimal Assistance - Patient > 75%     Upper Body Dressing/Undressing Upper body dressing   What is the patient wearing?: Button up shirt    Upper body assist Assist Level: Minimal Assistance - Patient > 75%    Lower Body Dressing/Undressing Lower body dressing      What is the patient wearing?: Pants     Lower body assist Assist for lower body dressing: Moderate Assistance - Patient 50 - 74%      Toileting Toileting    Toileting assist Assist for toileting: Moderate Assistance - Patient 50 - 74%     Transfers Chair/bed transfer  Transfers assist  Chair/bed transfer activity did not occur: Refused  Chair/bed transfer assist level: Minimal Assistance - Patient > 75% Chair/bed transfer assistive device: Programmer, multimedia   Ambulation assist   Ambulation activity did not occur: Safety/medical concerns  Assist level: 2 helpers Assistive device: Walker-rolling Max distance: 50'   Walk 10 feet activity   Assist  Walk 10 feet activity did not occur: Safety/medical concerns  Assist level: 2 helpers Assistive device: Walker-rolling   Walk 50 feet activity   Assist Walk 50 feet with 2 turns activity did not occur: Safety/medical concerns  Assist level: 2 helpers Assistive device: Walker-rolling    Walk 150 feet activity   Assist Walk 150 feet activity did not occur: Safety/medical concerns         Walk 10 feet on uneven surface  activity   Assist Walk 10 feet on uneven surfaces activity did not occur: Safety/medical concerns         Wheelchair     Assist Is the patient using a wheelchair?: Yes Type of Wheelchair: Manual    Wheelchair assist level: Supervision/Verbal cueing Max wheelchair distance: 40    Wheelchair 50 feet with 2 turns activity    Assist        Assist Level: Dependent - Patient 0%   Wheelchair 150 feet activity     Assist      Assist Level: Dependent - Patient 0%   Blood pressure (!) 134/59, pulse (!) 102, temperature (!) 97.5 F (36.4 C), resp. rate 18, height 5\' 10"  (1.778 m), weight 74 kg, SpO2 93 %.  Assessment/Plan: Functional deficits secondary  to Debility from MSSA bactermia/PE/endovascular repair of aortic leak; PE and Previous styage IIIB lung CA with new hepatic and bone metastases.  ELOS-  10/11/21 Pt may shower -Continue CIR therapies including PT, OT. Interdisciplinary  team conference today to discuss goals, barriers to discharge, and dc planning.      2. DVT/R lieofemoral DVT- Lovenox 70 mg BID- until CT in 4 weeks by Vascular with potential switch to oral 3. Pain Management: Tylenola nd Percocet as needed for pain- for LUE/LLE and back pain .   12/12 prn percocet,  Restarted Gabapentin 300 mg QHS-   12/24 continue scheduled percocet in am and kpad   -dc'ed lidocaine patch and are trying aspercreme over L shoulder   -continue flector for back  12/27 will add second scheduled dose of oxycodoen at 1200 daily 4. Mood:-  Palliative care following due to his cancer and ego support as required-  ?  neuropsychology    5. Skin/Wound Care: encouraging nutrition  -wounds healing 6. . Fluids/Electrolytes/Nutrition: encourage PO -pt is drinking protein supps and eating fairly well -weights stable last week, recheck tomorrow  7. MSSA bacteremia- is on Cefazolin q8 hours for 4 weeks from 09/09/21 -thoracentesis cultures are negative. Fungal cx with gs still pending  8. Persistent Afib- is recent diagnosis- Metoprolol just increased to 75 mg BID and on Diltiazem 120 mg daily- monitor for hypotension and hold meds if BP <100.    Vitals:   10/04/21 0556 10/04/21 0901  BP: (!) 134/59   Pulse: 70 (!) 102  Resp: 16 18  Temp: (!) 97.5 F (36.4 C)   SpO2: 96% 93%    12/27 appreciate cards consult and recs.  -rec transition to eliquis 5mg  bid from lovenox (see #2) -cards has signed off-->outpt f/u with Dr. Sallyanne Kuster 9. S/p aortic leak/endovascular repair- periaortic hematoma- vascular to reorder at CT in 4 weeks to follow up 10. Previous Stage IIIB Lung CA- with new hepatic and bone mets- likely Stage IV-  -palliative care follow up appreciated, discussed palliative care with wife- she has reached out to patient's PCP to discuss.  -pt is DNR - F/U with Oncology and maybe hospice after d/c.  12/27 intermittent DOE, has been stable  -continue oxygen  -xopenex and  pulmicort scheduled HHN have helped  -decreased xopenex to bid along with pulmicort  -  scheduled mucinex dm to help him mobilize secretions  -CXR 12/26 now shows complete white-out of left lung   -will ask pulmonary for recs 11. Constipation-    Last bm 12/21--->sorbitol today 12/24 12. PICC line for IV ABX- as of 09/17/21 13. LE Edema--improved with ACE wraps and elevation, nutrition 14. Hypokalemia: supplemented today  >35 minutes spent in discussion with patient and primarily his wife regarding patient's hospital course shortness of breath, continued nebulizer treatments, blood pressures, fatigue, palliative care services, consulting cardiology for outpatient follow-up             LOS: 17 days A FACE TO FACE EVALUATION WAS PERFORMED  Meredith Staggers 10/04/2021, 10:23 AM

## 2021-10-04 NOTE — Discharge Instructions (Addendum)
Inpatient Rehab Discharge Instructions  [place] Discharge date and time: 10/11/2021   Activities/Precautions/ Functional Status: Activity: no lifting, driving, or strenuous exercise until cleared by MD Diet: regular diet Wound Care: keep wound clean and dry Functional status:  ___ No restrictions     ___ Walk up steps independently ___ 24/7 supervision/assistance   _x__ Walk up steps with assistance _x__ Intermittent supervision/assistance  ___ Bathe/dress independently ___ Walk with walker     _x__ Bathe/dress with assistance ___ Walk Independently    ___ Shower independently __x_ Walk with assistance    __x_ Shower with assistance _x__ No alcohol     ___ Return to work/school ________  Special Instructions:  No driving, alcohol consumption or tobacco use.    COMMUNITY REFERRALS UPON DISCHARGE:    Home Health:   PT     OT     RN    SNA                    Agency:[facility] *Please expect follow-up within 2-3 days of discharge to schedule your home visit. If you have not received follow-up, be sure to contact branch directly.*     Medical Equipment/Items Ordered: transport chair, oxygen                                                 Agency/Supplier: [facility] [phone]  GENERAL COMMUNITY RESOURCES FOR PATIENT/FAMILY: A referral for palliative care with [facility] was made on 10/05/2021. Please expect follow-up in which they will perform wellness check-ins. If you have any questions, please call [phone].  My questions have been answered and I understand these instructions. I will adhere to these goals and the provided educational materials after my discharge from the hospital.  Patient/Caregiver Signature _______________________________ Date __________  Clinician Signature _______________________________________ Date __________  Please bring this form and your medication list with you to all your follow-up  doctor's appointments.

## 2021-10-04 NOTE — Patient Care Conference (Signed)
Inpatient RehabilitationTeam Conference and Plan of Care Update Date: 10/04/2021   Time: 10:52 AM    Patient Name: Leon Lyons      Medical Record Number: [id number]  Date of Birth: 11/09/1932 Sex: Male         Room/Bed: 9G29B/2W41L-24 Payor Info: Payor: MEDICARE / Plan: MEDICARE PART A AND B / Product Type: *No Product type* /    Admit Date/Time:  09/17/2021  6:30 PM  Primary Diagnosis:  [place] [facility] Problems: Principal Problem:   Debility    Expected Discharge Date: Expected Discharge Date: 10/11/21  Team Members Present: Physician leading conference: Dr. Alger Simons Social Worker Present: Loralee Pacas, [place] Nurse Present: Dorthula Nettles, RN PT Present: Tereasa Coop, PT OT Present: Cherylynn Ridges, OT PPS Coordinator present : Ileana Ladd, PT     Current Status/Progress Goal Weekly Team Focus  Bowel/Bladder   Continent of B/b. LBM 12/25  Maintain continence  Toilet PRN   Swallow/Nutrition/ Hydration             ADL's   Overall Min A for sit<> stands and stand-pivots, Min A UB ADLs, MIn/mod A LB ADLs  min A, likely upgrade to CGA/supervision this week  pt/family education, self-care retraining, transfers, activity tolerance   Mobility             Communication             Safety/Cognition/ Behavioral Observations            Pain   Pain to lower back. Left shoulder. Scheduled pain patches.  Pain <3/10  Assess Qshift and prn   Skin   Closed incision lower mid abd. Remainder of skin intact.  Maintain skin integrity  Assess Qshift and prn     Discharge Planning:      Team Discussion: Has had 3 different chest x-rays that looked the same, yesterdays x-ray showed almost complete white out to left side. Trying to manage pain. Incontinent bladder, offer urinal as needed. Pain to left side. Incisions are healing. Min assist with ADL's and transfers. Fatigues quickly. Was doing a lot of belly breathing today. Spouse is very helpful. Currently  min/mod assist, ambulated > 150 ft with contact guard assist. Will have pulmonary consult on latest chest x-rays. Can possibly move up discharge date, depending on Pulmonary. Patient on target to meet rehab goals: yes, min assist goals.  *See Care Plan and progress notes for long and short-term goals.   Revisions to Treatment Plan:  Not at this time.  Teaching Needs: Family education, medication/pain management, skin/wound care, breathing techniques, transfer/gait training, etc.  Current Barriers to Discharge: Incontinence, Medication compliance, and Nutritional means  Possible Resolutions to Barriers: Family education Offer urinal as needed Provide maximal nutritional support     Medical Summary Current Status: ongoing pain in back/shoulder. persistent SOB, cough. increased Left pleural effusion, nutritionally better  Barriers to Discharge: Medical stability   Possible Resolutions to Barriers/Weekly Focus: pain mgt, pulmonary consult for effusion.maximize nutrition   Continued Need for Acute Rehabilitation Level of Care: The patient requires daily medical management by a physician with specialized training in physical medicine and rehabilitation for the following reasons: Direction of a multidisciplinary physical rehabilitation program to maximize functional independence : Yes Medical management of patient stability for increased activity during participation in an intensive rehabilitation regime.: Yes Analysis of laboratory values and/or radiology reports with any subsequent need for medication adjustment and/or medical intervention. : Yes   I attest that I was  present, lead the team conference, and concur with the assessment and plan of the team.   Cristi Loron 10/04/2021, 2:41 PM

## 2021-10-04 NOTE — Progress Notes (Signed)
Patient ID: Leon Lyons, male   DOB: [birth year], 87 y.o.   MRN: [id number]  SW met with pt and pt wife in room to provide updates from team conference, and d/c date remains 10/11/21. Pt was very tired at time of visit and appeared to be sleeping most of SW visit. Wife amenable to palliative care outpatient referral- SW will set up. SW will confirm pt remains active with [facility] services with [facility].   *SW spoke with Wendy/[facility]  to confirm pt remains active for [facility] only. SW informed referral will be for HHPT/OT/SN/aide. SW will update if any changes to his discharge plan.  Loralee Pacas, MSW, [place] Office: [phone] Cell: [phone] Fax: [phone]

## 2021-10-04 NOTE — Progress Notes (Signed)
Occupational Therapy Session Note  Patient Details  Name: Leon Lyons MRN: [id number] Date of Birth: Nov 06, 1932  Today's Date: 10/04/2021 OT Group Time: 1431-1530 OT Group Time Calculation (min): 59 min 1531-1540 ( individual time) 9 mins   Short Term Goals: Week 3:  OT Short Term Goal 1 (Week 3): LTG=STG 2/2 ELOS  Skilled Therapeutic Interventions/Progress Updates:  Pt participated in group session with a focus on BUE strength and endurance to facilitate improved activity tolerance and strength for higher level BADLs and functional mobility tasks. Pt first in engaged in ball tosses around the room to other group members to facilitate improved BUE coordination, strength, and cardiovascular endurance for ~ 6 mins as a light warm up. Next, pt completed  seated UB therapeutic activity where pt was instructed to roll large dice when it was pts turn. Each number rolled correlated with UB therex/ number of repetitions listed on board. UB therex included scapular protraction/retraction, punches, bicep curls, upward rows, arm rolls with a range of reps from 10-20.  Pt utilized 1 lb weight during session. . Pt with increased LUE pain therefore pt only used weight on R side and let LUE rest during session. Education provided on importance of determining appropriate modifications that benefited each pt  in order to meet pts specific needs. Education also provided on completing self checks to determine how much energy each pt has left as an energy conservation strategy. Ended with 3 mins of guided deep breathing where pts were instructed on sequence of deep breathing and benefits of deep breathing to accommodate for stress, anxiety and relaxation. Pt transported back to room by this OTA where pt completed stand pivot transfer from w/c>EOB with MOD A to stand and MIN A to pivot. Sit>supine with MOD A d/t increased fatigue.  Pt on 2L during session with SpO2 96% post session with HR 120 bpm. Pt left  supine in bed with RN entering to provided pain meds.   Therapy Documentation Precautions:  Precautions Precautions: Fall, Other (comment) Precaution Comments: monitor BP, HR, and SpO2; possible L shoulder RC injury ? Restrictions Weight Bearing Restrictions: No  Pain: unrated pain reported in LUE, pt rested LUE during session and utilized compensatory strategy of using RUE only, requested pain meds after session     Therapy/Group: Group Therapy  [name] 10/04/2021, 4:19 PM

## 2021-10-04 NOTE — Progress Notes (Signed)
Occupational Therapy Session Note  Patient Details  Name: Leon Lyons MRN: [id number] Date of Birth: 1932/10/12  Today's Date: 10/04/2021 OT Individual Time: 4944-9675 OT Individual Time Calculation (min): 70 min   Short Term Goals: Week 3:  OT Short Term Goal 1 (Week 3): LTG=STG 2/2 ELOS  Skilled Therapeutic Interventions/Progress Updates:    Pt greeted semi-reclined in bed requesting the urinal. Using bed functions, pt able to place urinal with min A for continent void of bladder. Pt on 2.5 L of O2 throughout our session with SpO2 93-95%. Pt needed min A to elevate trunk into sitting. Min A sit<>stand w/ RW, then CGA to pivot to wc. Pt brought to the sink for bathing/dressing tasks. Focus on sit<>stands and standing balance/endurance during functional tasks. Pt tolerated standing long enough to wash peri-area and buttocks with CGA for balance. Pt reported increased back pain in standing and had to return to sitting. Blocked practice for LB dressing techniques with pt able to bend forward to help pull on pants, but still needed OT assist. Extended rest breaks after standing bouts. Pt unable to finish dressing due to IV still being hooked up to PICC line. Nursing notified. Pt requested to eat breakfast and was able to self-feed meal and utilize utensils correctly. Pt only needed set-up A to open some containers. OT educated on OT goals and progress this past week. Discussed beginning family education this week with spouse as well. Pt left seated in wc with spouse present, call bell in reach, and needs met.   Therapy Documentation Precautions:  Precautions Precautions: Fall, Other (comment) Precaution Comments: monitor BP, HR, and SpO2; possible L shoulder RC injury ? Restrictions Weight Bearing Restrictions: No  Pain:  Pt reports pain in back when standing. No number given. Rest and repositioned for comfort.   Therapy/Group: Individual Therapy  Valma Cava 10/04/2021, 8:46  AM

## 2021-10-04 NOTE — Consult Note (Signed)
NAME:  Leon Lyons, MRN:  [id number], DOB:  [birth year], LOS: 17 ADMISSION DATE:  09/17/2021, CONSULTATION DATE:  10/04/21 REFERRING MD:  Riley Kill, CHIEF COMPLAINT:  SOB   History of Present Illness:  87 year old man with extensive recent PMH notable for Stage 3B lung cancer s/p chemoradiation, afib, PE on AC, recent volvulus surgery complicated by aortic leak who is now in rehab.  He has been getting more SOB lately with enlarging left pleural effusion.  Had tap back earlier this month with atypical cells thought to be more reactive.  PCCM consulted to assist with management.  Pertinent  Medical History   Past Medical History:  Diagnosis Date   Acute deep vein thrombosis (DVT) of right lower extremity (HCC)    Aortic rupture (contained)    a. 09/2021 s/p EVAR.   Cecal volvulus (HCC)    a. s/p surgical repair 07/2021.   History of echocardiogram    a. 09/10/2021 Echo: EF 60-65%, no rwma, triv MR, mod TR; b. 09/2021 Echo: EF 60-65%, no rwma, nl RV size/fxn, no LA/LAA thrombus, large left pl effusion, triv MR w/mild late MVP, mild-mod TR, mild AI, Ao sclerosis w/o stenosis, Gr IV plaque of Ao arch/desc Ao.   History of radiation therapy 03/24/2021   left lung   02/11/2021-03/24/2021  Dr Antony Blackbird   MSSA bacteremia    a. 09/2021 TEE: EF 60-65%, no vegetation.   Non-small cell lung cancer (HCC)    Persistent atrial fibrillation (HCC)    Pleural effusion, left    a. 09/2021 s/p thoracentesis - .   Pulmonary embolism (HCC)    Remote Tobacco use    a. Quit 60 + yrs ago.     Significant Hospital Events: Including procedures, antibiotic start and stop dates in addition to other pertinent events   12/27 consulted  Interim History / Subjective:  Consulted  Objective   Blood pressure (!) 118/46, pulse 66, temperature (!) 97.4 F (36.3 C), temperature source Oral, resp. rate 18, height 5\' 10"  (1.778 m), weight 74 kg, SpO2 98 %.        Intake/Output Summary (Last 24 hours) at  10/04/2021 1738 Last data filed at 10/04/2021 0900 Gross per 24 hour  Intake 240 ml  Output 550 ml  Net -310 ml   Filed Weights   09/17/21 1903 09/23/21 0609 09/24/21 1953  Weight: 79.5 kg 72.4 kg 74 kg    Examination: General: pleasant elderly man laying in bed HENT: MMM, temporal wasting Lungs: severely diminished on L, R clear Cardiovascular: RRR, ext warm Abdomen: soft, +BS Extremities: no edema Neuro: moves all 4 ext to command Skin: no rashes  Resolved Hospital Problem list   N/a  Assessment & Plan:  Recurrent symptomatic left effusion in setting of ipsilateral lung cancer s/p chemotherapy and recent MSSA bacteremia - Will plan on thora tomorrow, dry out as much as possible, send for usual studies, and get CT chest to see underlying architecture. - Hold lovenox until tomorrow evening - Encourage IS, wean O2 for sats >90% - Poor prognosis  Best Practice (right click and "Reselect all SmartList Selections" daily)  Per primary  Labs   CBC: Recent Labs  Lab 09/30/21 0450 10/03/21 0400  WBC 5.4 6.0  HGB 8.9* 9.1*  HCT 29.6* 29.6*  MCV 101.4* 99.7  PLT 272 259    Basic Metabolic Panel: Recent Labs  Lab 10/03/21 0400 10/04/21 0337  NA 136 134*  K 2.7* 3.7  CL 92* 92*  CO2 36* 36*  GLUCOSE 92 99  BUN 12 13  CREATININE 0.55* 0.51*  CALCIUM 8.5* 8.7*   GFR: Estimated Creatinine Clearance: 68.4 mL/min (A) (by C-G formula based on SCr of 0.51 mg/dL (L)). Recent Labs  Lab 09/30/21 0450 10/03/21 0400  WBC 5.4 6.0    Liver Function Tests: No results for input(s): AST, ALT, ALKPHOS, BILITOT, PROT, ALBUMIN in the last 168 hours. No results for input(s): LIPASE, AMYLASE in the last 168 hours. No results for input(s): AMMONIA in the last 168 hours.  ABG    Component Value Date/Time   PHART 7.397 09/11/2021 0830   PCO2ART 38.8 09/11/2021 0830   PO2ART 235 (H) 09/11/2021 0830   HCO3 23.9 09/11/2021 0830   TCO2 25 09/11/2021 0830   ACIDBASEDEF 1.0  09/11/2021 0830   O2SAT 100.0 09/11/2021 0830     Coagulation Profile: No results for input(s): INR, PROTIME in the last 168 hours.  Cardiac Enzymes: No results for input(s): CKTOTAL, CKMB, CKMBINDEX, TROPONINI in the last 168 hours.  HbA1C: No results found for: HGBA1C  CBG: No results for input(s): GLUCAP in the last 168 hours.  Review of Systems:    Positive Symptoms in bold:  Constitutional fevers, chills, weight loss, fatigue, anorexia, malaise  Eyes decreased vision, double vision, eye irritation  Ears, Nose, Mouth, Throat sore throat, trouble swallowing, sinus congestion  Cardiovascular chest pain, paroxysmal nocturnal dyspnea, lower ext edema, palpitations   Respiratory SOB, cough, DOE, hemoptysis, wheezing  Gastrointestinal nausea, vomiting, diarrhea  Genitourinary burning with urination, trouble urinating  Musculoskeletal joint aches, joint swelling, back pain  Integumentary  rashes, skin lesions  Neurological focal weakness, focal numbness, trouble speaking, headaches  Psychiatric depression, anxiety, confusion  Endocrine polyuria, polydipsia, cold intolerance, heat intolerance  Hematologic abnormal bruising, abnormal bleeding, unexplained nose bleeds  Allergic/Immunologic recurrent infections, hives, swollen lymph nodes     Past Medical History:  He,  has a past medical history of Acute deep vein thrombosis (DVT) of right lower extremity (HCC), Aortic rupture (contained), Cecal volvulus ([place]), History of echocardiogram, History of radiation therapy (03/24/2021), MSSA bacteremia, Non-small cell lung cancer ([place]), Persistent atrial fibrillation ([place]), Pleural effusion, left, Pulmonary embolism ([place]), and Remote Tobacco use.   Surgical History:   Past Surgical History:  Procedure Laterality Date   ABDOMINAL AORTIC ENDOVASCULAR STENT GRAFT Bilateral 09/11/2021   Procedure: EVAR;  Surgeon: Angelia Mould, MD;  Location: [facility];  Service: Vascular;   Laterality: Bilateral;   APPENDECTOMY     BRONCHIAL BIOPSY  01/07/2021   Procedure: BRONCHIAL BIOPSIES;  Surgeon: Garner Nash, DO;  Location: [facility];  Service: Pulmonary;;   BRONCHIAL BRUSHINGS  01/07/2021   Procedure: BRONCHIAL BRUSHINGS;  Surgeon: Garner Nash, DO;  Location: [facility];  Service: Pulmonary;;   BRONCHIAL NEEDLE ASPIRATION BIOPSY  01/07/2021   Procedure: BRONCHIAL NEEDLE ASPIRATION BIOPSIES;  Surgeon: Garner Nash, DO;  Location: [facility];  Service: Pulmonary;;   BRONCHIAL WASHINGS  01/07/2021   Procedure: BRONCHIAL WASHINGS;  Surgeon: Garner Nash, DO;  Location: [place];  Service: Pulmonary;;   IR THORACENTESIS ASP PLEURAL SPACE W/IMG GUIDE  09/15/2021   IRRIGATION AND DEBRIDEMENT SEBACEOUS CYST     TEE WITHOUT CARDIOVERSION N/A 09/14/2021   Procedure: TRANSESOPHAGEAL ECHOCARDIOGRAM (TEE);  Surgeon: Sanda Klein, MD;  Location: [place];  Service: Cardiovascular;  Laterality: N/A;   VIDEO BRONCHOSCOPY WITH ENDOBRONCHIAL ULTRASOUND Bilateral 01/07/2021   Procedure: VIDEO BRONCHOSCOPY WITH ENDOBRONCHIAL ULTRASOUND;  Surgeon: Garner Nash, DO;  Location: [facility];  Service: Pulmonary;  Laterality: Bilateral;  possible need for radial ultrasound and fluoro      Social History:   reports that he quit smoking about 61 years ago. His smoking use included cigarettes. He has never used smokeless tobacco. He reports that he does not currently use alcohol. He reports that he does not use drugs.   Family History:  His family history includes Hypertension in an other family member.   Allergies Allergies  Allergen Reactions   Diclofenac Sodium Other (See Comments)    Kidneys were affected, was on Diclofenac $RemoveBefor'75mg'[id number]$  po BID (not topical) for Left shoulder pain,     Home Medications  Prior to Admission medications   Medication Sig Start Date End Date Taking? Authorizing Provider  acetaminophen (TYLENOL) 500 MG tablet Take 500-1,000 mg by mouth  every 8 (eight) hours as needed (for pain).    [provider]  alum & mag hydroxide-simeth (MAALOX/MYLANTA) 200-200-20 MG/5ML suspension Take 15-30 mLs by mouth every 2 (two) hours as needed for indigestion. 09/17/21   Barb Merino, MD  bisacodyl (DULCOLAX) 10 MG suppository Place 1 suppository (10 mg total) rectally daily as needed for moderate constipation. 09/17/21   Barb Merino, MD  ceFAZolin (ANCEF) IVPB Inject 2 g into the vein every 8 (eight) hours for 22 days. Indication:  MSSA bacteremia First Dose: Yes Last Day of Therapy:  10/08/21 Labs - Once weekly:  CBC/D and BMP, Labs - Every other week:  ESR and CRP Method of administration: IV Push Method of administration may be changed at the discretion of home infusion pharmacist based upon assessment of the patient and/or caregiver's ability to self-administer the medication ordered. 09/17/21 10/09/21  Barb Merino, MD  Chlorhexidine Gluconate Cloth 2 % PADS Apply 6 each topically daily. 09/17/21   Barb Merino, MD  Cholecalciferol (VITAMIN D3 SUPER STRENGTH) 50 MCG (2000 UT) CAPS Take 4,000 Units by mouth daily.    [provider]  cyclobenzaprine (FLEXERIL) 5 MG tablet Take 1 tablet (5 mg total) by mouth 3 (three) times daily as needed for muscle spasms. 09/17/21   Barb Merino, MD  diltiazem (CARDIZEM) 120 MG tablet Take 120 mg by mouth daily.    [provider]  docusate sodium (COLACE) 100 MG capsule Take 1 capsule (100 mg total) by mouth daily. 09/17/21   Barb Merino, MD  enoxaparin (LOVENOX) 80 MG/0.8ML injection Inject 70 mg into the skin 2 (two) times daily. 09/02/21   [provider]  feeding supplement (ENSURE ENLIVE / ENSURE PLUS) LIQD Take 237 mLs by mouth 2 (two) times daily between meals. 09/17/21   Barb Merino, MD  folic acid (FOLVITE) 1 MG tablet Take 1 tablet (1 mg total) by mouth daily. 09/17/21   Barb Merino, MD  gabapentin (NEURONTIN) 300 MG capsule Take 300 mg by mouth  at bedtime.    [provider]  guaiFENesin-dextromethorphan (ROBITUSSIN DM) 100-10 MG/5ML syrup Take 5 mLs by mouth every 6 (six) hours as needed for cough. 09/17/21   Barb Merino, MD  metoprolol tartrate (LOPRESSOR) 50 MG tablet Take 1 tablet (50 mg total) by mouth 2 (two) times daily. Hold if systolic pressure <301 or heart rate <65 09/17/21   Barb Merino, MD  oxyCODONE-acetaminophen (PERCOCET/ROXICET) 5-325 MG tablet Take 1-2 tablets by mouth every 4 (four) hours as needed for moderate pain. 09/17/21   Barb Merino, MD  pantoprazole (PROTONIX) 40 MG tablet Take 1 tablet (40 mg total) by mouth daily. 09/17/21  Barb Merino, MD  polyethylene glycol (MIRALAX / GLYCOLAX) 17 g packet Take 17 g by mouth daily. 09/17/21   Barb Merino, MD  rosuvastatin (CRESTOR) 5 MG tablet Take 1 tablet (5 mg total) by mouth daily. 09/17/21   Barb Merino, MD

## 2021-10-04 NOTE — Telephone Encounter (Signed)
-----   Message from Truddie Hidden, RN sent at 10/04/2021  7:14 AM EST ----- Regarding: FW: hospital f/u appt  ----- Message ----- From: Theora Gianotti, NP Sent: 10/03/2021   3:08 PM EST To: Rebeca Alert Ash/Hp Scheduling Subject: hospital f/u appt                              Hi,  We saw this pt in consultation today @ [facility].  He lives in [place] and would like to f/u in [place].  He won't be leaving [facility] until 1/3 at the earliest.  Would you pls reach out to him/his wife to arrange for cardiology f/u ~ 2-3 wks after 1/3 (dx Afib)?  Thanks,  Gerald Stabs

## 2021-10-04 NOTE — Telephone Encounter (Signed)
LVM for pt to call and schedule an appt with cardiology/kbl 10/04/2021

## 2021-10-05 ENCOUNTER — Inpatient Hospital Stay (HOSPITAL_COMMUNITY): Payer: Medicare Other

## 2021-10-05 ENCOUNTER — Encounter (HOSPITAL_COMMUNITY)
Admission: EM | Disposition: A | Discharge status: Home Health | Payer: Self-pay | Source: Intra-hospital | Attending: Physical Medicine & Rehabilitation

## 2021-10-05 DIAGNOSIS — R5381 Other malaise: Secondary | ICD-10-CM | POA: Diagnosis not present

## 2021-10-05 LAB — BODY FLUID CELL COUNT WITH DIFFERENTIAL
Eos, Fluid: 0 %
Lymphs, Fluid: 83 %
Monocyte-Macrophage-Serous Fluid: 16 % — ABNORMAL LOW (ref 50–90)
Neutrophil Count, Fluid: 1 % (ref 0–25)
Total Nucleated Cell Count, Fluid: 62 cu mm (ref 0–1000)

## 2021-10-05 LAB — PROTEIN, PLEURAL OR PERITONEAL FLUID: Total protein, fluid: <3 g/dL

## 2021-10-05 LAB — PROTEIN, TOTAL: Total Protein: 6.6 g/dL (ref 6.5–8.1)

## 2021-10-05 LAB — LACTATE DEHYDROGENASE, PLEURAL OR PERITONEAL FLUID: LD, Fluid: 44 U/L — ABNORMAL HIGH (ref 3–23)

## 2021-10-05 LAB — LACTATE DEHYDROGENASE: LDH: 144 U/L (ref 98–192)

## 2021-10-05 SURGERY — THORACENTESIS
Anesthesia: Topical | Laterality: Left

## 2021-10-05 MED ORDER — ALBUMIN HUMAN 25 % IV SOLN
25.0000 g | Freq: Four times a day (QID) | INTRAVENOUS | Status: DC
  Filled 2021-10-05 (×2): qty 100

## 2021-10-05 MED ORDER — ALBUMIN HUMAN 25 % IV SOLN
25.0000 g | Freq: Four times a day (QID) | INTRAVENOUS | Status: AC
  Administered 2021-10-05 – 2021-10-06 (×4): 25 g via INTRAVENOUS
  Filled 2021-10-05 (×5): qty 100

## 2021-10-05 MED ORDER — LEVALBUTEROL HCL 0.63 MG/3ML IN NEBU
0.6300 mg | INHALATION_SOLUTION | Freq: Two times a day (BID) | RESPIRATORY_TRACT | Status: DC
  Administered 2021-10-06 – 2021-10-10 (×9): 0.63 mg via RESPIRATORY_TRACT
  Filled 2021-10-05 (×11): qty 3

## 2021-10-05 MED ORDER — FUROSEMIDE 10 MG/ML IJ SOLN
40.0000 mg | Freq: Three times a day (TID) | INTRAMUSCULAR | Status: DC
  Administered 2021-10-05: 10:00:00 40 mg via INTRAVENOUS
  Filled 2021-10-05: qty 4

## 2021-10-05 MED ORDER — LEVALBUTEROL HCL 0.63 MG/3ML IN NEBU
0.6300 mg | INHALATION_SOLUTION | Freq: Three times a day (TID) | RESPIRATORY_TRACT | Status: DC
Start: 2021-10-05 — End: 2021-10-05
  Administered 2021-10-05: 09:00:00 0.63 mg via RESPIRATORY_TRACT
  Filled 2021-10-05 (×3): qty 3

## 2021-10-05 MED ORDER — FUROSEMIDE 10 MG/ML IJ SOLN
40.0000 mg | Freq: Three times a day (TID) | INTRAMUSCULAR | Status: AC
  Administered 2021-10-05 (×2): 40 mg via INTRAVENOUS
  Filled 2021-10-05 (×2): qty 4

## 2021-10-05 MED ORDER — POTASSIUM CHLORIDE CRYS ER 20 MEQ PO TBCR
40.0000 meq | EXTENDED_RELEASE_TABLET | Freq: Two times a day (BID) | ORAL | Status: AC
  Administered 2021-10-05 (×2): 40 meq via ORAL
  Filled 2021-10-05 (×2): qty 2

## 2021-10-05 NOTE — Progress Notes (Signed)
Physical Therapy Session Note  Patient Details  Name: Leon Lyons MRN: [id number] Date of Birth: 11/10/1932  Today's Date: 10/05/2021 PT Missed Time: 37 Minutes Missed Time Reason: Pain;Patient fatigue  Short Term Goals: Week 2:  PT Short Term Goal 1 (Week 2): Pt will perform bed mobility consistently with minA. PT Short Term Goal 2 (Week 2): Pt will perform sit to stand consistently with minA. PT Short Term Goal 3 (Week 2): Pt will perform bed to chair transfer consistently with minA. PT Short Term Goal 4 (Week 2): Pt will ambulate x150' with minA and LRAD.  Skilled Therapeutic Interventions/Progress Updates:     Pt underwent thoracentesis this morning and requests to rest due to pain and fatigue. PT will follow up as appropriate.  Therapy Documentation Precautions:  Precautions Precautions: Fall, Other (comment) Precaution Comments: monitor BP, HR, and SpO2; possible L shoulder RC injury ? Restrictions Weight Bearing Restrictions: No General: PT Amount of Missed Time (min): 60 Minutes PT Missed Treatment Reason: Pain;Patient fatigue  Therapy/Group: Individual Therapy  Breck Coons, PT, DPT 10/05/2021, 2:45 PM

## 2021-10-05 NOTE — Procedures (Signed)
Thoracentesis  Procedure Note  Leon Lyons  [id number]  [birth year]  Date:10/05/21  Time:8:34 AM   Provider Performing:Lewis Keats C Tamala Julian   Procedure: Thoracentesis with imaging guidance (40768)  Indication(s) Pleural Effusion  Consent Risks of the procedure as well as the alternatives and risks of each were explained to the patient and/or caregiver.  Consent for the procedure was obtained and is signed in the bedside chart  Anesthesia Topical only with 1% lidocaine    Time Out Verified patient identification, verified procedure, site/side was marked, verified correct patient position, special equipment/implants available, medications/allergies/relevant history reviewed, required imaging and test results available.   Sterile Technique Maximal sterile technique including full sterile barrier drape, hand hygiene, sterile gown, sterile gloves, mask, hair covering, sterile ultrasound probe cover (if used).  Procedure Description Ultrasound was used to identify appropriate pleural anatomy for placement and overlying skin marked.  Area of drainage cleaned and draped in sterile fashion. Lidocaine was used to anesthetize the skin and subcutaneous tissue.  1300 cc's of straw appearing fluid was drained from the left pleural space. Catheter then removed and bandaid applied to site.   Complications/Tolerance Stopped due to pleurisy Chest X-ray is ordered to confirm no post-procedural complication.   EBL Minimal   Specimen(s) Pleural fluid

## 2021-10-05 NOTE — Progress Notes (Signed)
Occupational Therapy Session Note  Patient Details  Name: Leon Lyons MRN: [id number] Date of Birth: [birth year]  Today's Date: 10/05/2021 OT Missed Time: 1 Minutes Missed Time Reason: Pain   Short Term Goals: Week 1:  OT Short Term Goal 1 (Week 1): Pt will perform sit to stand with mod A 50% of time without the STEDY for clothing mangement OT Short Term Goal 1 - Progress (Week 1): Met OT Short Term Goal 2 (Week 1): Pt will be able to transfer with mod A 50% of the time to the toilet/ BSC OT Short Term Goal 2 - Progress (Week 1): Met OT Short Term Goal 3 (Week 1): PT will be able to thread LB clothing with min A with AE prn OT Short Term Goal 3 - Progress (Week 1): Progressing toward goal Week 2:  OT Short Term Goal 1 (Week 2): PT will be able to thread LB clothing with min A with AE prn OT Short Term Goal 1 - Progress (Week 2): Met OT Short Term Goal 2 (Week 2): Pt will consistently stand with mod A at the sink in preparation for BADL tasks OT Short Term Goal 2 - Progress (Week 2): Met OT Short Term Goal 3 (Week 2): Patient will demonstrate R elbow ROM for edema management with min cues OT Short Term Goal 3 - Progress (Week 2): Met OT Short Term Goal 4 (Week 2): Patient will complete 1 step of toileting task Week 3:  OT Short Term Goal 1 (Week 3): LTG=STG 2/2 ELOS  Skilled Therapeutic Interventions/Progress Updates:    Pt received semi-reclined in bed with family present. SatO2 at 97% on 1.5 L via [place]. Pt reports having just used incentive spirometer. Declining therapy / bed level activity this date due to fatigue/pain from earlier thoracentesis procedure.  Pt left semi-reclined in bed with family present, bed alarm engaged, call bell in reach, and all immediate needs met.    Therapy Documentation Precautions:  Precautions Precautions: Fall, Other (comment) Precaution Comments: monitor BP, HR, and SpO2; possible L shoulder RC injury ? Restrictions Weight Bearing  Restrictions: No  Pain: c/o pain post procedure, did not rate/specify, declined intervention ADL: See Care Tool for more details.   Therapy/Group: Individual Therapy  Volanda Napoleon MS, OTR/L  10/05/2021, 6:51 AM

## 2021-10-05 NOTE — Progress Notes (Signed)
Physical Therapy Session Note  Patient Details  Name: Leon Lyons MRN: [id number] Date of Birth: 1933/03/27  Today's Date: 10/05/2021 PT Individual Time: 0810-0820 PT Individual Time Calculation (min): 10 min   Short Term Goals: Week 2:  PT Short Term Goal 1 (Week 2): Pt will perform bed mobility consistently with minA. PT Short Term Goal 2 (Week 2): Pt will perform sit to stand consistently with minA. PT Short Term Goal 3 (Week 2): Pt will perform bed to chair transfer consistently with minA. PT Short Term Goal 4 (Week 2): Pt will ambulate x150' with minA and LRAD.  Skilled Therapeutic Interventions/Progress Updates:    Pt received seated in bed, having chest xray performed. Pt missed 10 min of scheduled therapy session initially for chest xray. Once chest xray completed pt received seated in bed. Pt just had fluid removed from his lungs per nursing and is working on weaning off of supplemental O2. Pt titrated down to 1L O2 via [place], SpO2 initially at 88% but decreases to 84% while seated in bed. Increased O2 to 1.5L, increases to 90% (+). Pt declines any OOB mobility this AM due to pain from having fluid removed from lungs and fatigue. Pt left seated in bed with needs in reach, wife present. Pt missed 50 total minutes of scheduled therapy session due to chest xray and pain/fatigue from lung fluid removal.  Therapy Documentation Precautions:  Precautions Precautions: Fall, Other (comment) Precaution Comments: monitor BP, HR, and SpO2; possible L shoulder RC injury ? Restrictions Weight Bearing Restrictions: No General: PT Amount of Missed Time (min): 50 Minutes PT Missed Treatment Reason: Xray;Pain;Patient fatigue      Therapy/Group: Individual Therapy   Excell Seltzer, PT, DPT, CSRS  10/05/2021, 9:27 AM

## 2021-10-05 NOTE — Consult Note (Addendum)
This is a progress note    NAME:  Leon Lyons, MRN:  [id number], DOB:  1933-09-11, LOS: 24 ADMISSION DATE:  09/17/2021, CONSULTATION DATE:  10/04/21 REFERRING MD:  Naaman Plummer, CHIEF COMPLAINT:  SOB   History of Present Illness:  87 year old man with extensive recent PMH notable for Stage 3B lung cancer s/p chemoradiation, afib, PE on AC, recent volvulus surgery complicated by aortic leak who is now in rehab.  He has been getting more SOB lately with enlarging left pleural effusion.  Had tap back earlier this month with atypical cells thought to be more reactive.  PCCM consulted to assist with management.  Pertinent  Medical History   Past Medical History:  Diagnosis Date   Acute deep vein thrombosis (DVT) of right lower extremity (HCC)    Aortic rupture (contained)    a. 09/2021 s/p EVAR.   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 s/p surgical repair 07/2021.   History of echocardiogram    a. 09/10/2021 Echo: EF 60-65%, no rwma, triv MR, mod TR; b. 09/2021 Echo: EF 60-65%, no rwma, nl RV size/fxn, no LA/LAA thrombus, large left pl effusion, triv MR w/mild late MVP, mild-mod TR, mild AI, Ao sclerosis w/o stenosis, Gr IV plaque of Ao arch/desc Ao.   History of radiation therapy 03/24/2021   left lung   02/11/2021-03/24/2021  Dr Gery Pray   MSSA bacteremia    a. 09/2021 TEE: EF 60-65%, no vegetation.   Non-small cell lung cancer (HCC)    Persistent atrial fibrillation (HCC)    Pleural effusion, left    a. 09/2021 s/p thoracentesis - 816ml.   Pulmonary embolism ([place])    Remote Tobacco use    a. Quit 60 + yrs ago.     Significant Hospital Events: Including procedures, antibiotic start and stop dates in addition to other pertinent events   12/27 consulted  Interim History / Subjective:  No events. Left shoulder is sore, chronic. Still dyspneic with minimal exertion.  Objective   Blood pressure 124/66, pulse 84, temperature 97.6 F (36.4 C), resp. rate 18, height 5\' 10"  (1.778 m), weight 74  kg, SpO2 90 %.        Intake/Output Summary (Last 24 hours) at 10/05/2021 0831 Last data filed at 10/05/2021 0220 Gross per 24 hour  Intake 360 ml  Output 300 ml  Net 60 ml    Filed Weights   09/17/21 1903 09/23/21 0609 09/24/21 1953  Weight: 79.5 kg 72.4 kg 74 kg    Examination: General: pleasant elderly man laying in bed HENT: MMM, temporal wasting Lungs: diminished on L, R clear Cardiovascular: RRR, ext warm Abdomen: soft, +BS Extremities: no edema Neuro: moves all 4 ext to command, very weak Skin: no rashes, age-related changes  Resolved Hospital Problem list   N/a  Assessment & Plan:  Recurrent symptomatic left effusion in setting of ipsilateral lung cancer s/p chemoradiation and recent MSSA bacteremia.  CXR also still looks wet. - Thoracentesis today, f/u usual studies - Push diuresis with albumin+lasix+potassium given relative protein calorie malnutrition, hypokalemia - CT chest to make sure no obstructing mass contributing to issues - Will follow  Erskine Emery MD PCCM

## 2021-10-05 NOTE — Progress Notes (Signed)
Occupational Therapy Session Note  Patient Details  Name: Leon Lyons MRN: [id number] Date of Birth: 1932/12/17  Today's Date: 10/05/2021 OT Missed Time: 81 Minutes Missed Time Reason: Pain Patient had a medical procedure performed this A.M. and indicated that he was in severe pain, he opted to refuse OT treatment  the session.

## 2021-10-05 NOTE — Progress Notes (Signed)
PROGRESS NOTE   Subjective/Complaints: Pt with fair night. Still having back pain. Aware of thoracentesis scheduled for today. "Hope they fix me up"  ROS: Patient denies fever, rash, sore throat, blurred vision, nausea, vomiting, diarrhea,    chest pain,   headache, or mood change.   Objective:   DG Chest 1 View  Result Date: 10/05/2021 CLINICAL DATA:  Recent thoracentesis EXAM: CHEST  1 VIEW COMPARISON:  Chest x-ray 10/03/2021 FINDINGS: Heart is mildly enlarged. Mediastinum appears grossly stable. Tortuous thoracic aorta with calcified plaques in the arch. Right PICC tip in the right atrium. Interval improved aeration of the left hemithorax secondary to decreased pleural effusion and atelectasis. There is a persistent small to moderate left-sided pleural effusion and infiltrates in the aerated mid lung zone. Persistent small to moderate right pleural effusion with associated hazy opacities in the right lower lung zone likely representing layering effusion and atelectasis. No pneumothorax. IMPRESSION: 1. Small to moderate bilateral pleural effusions with associated atelectasis/infiltrate. Improved aeration of the left lung since previous study. 2. No pneumothorax visualized. Electronically Signed   By: Ofilia Neas M.D.   On: 10/05/2021 08:15   DG Chest 2 View  Result Date: 10/03/2021 CLINICAL DATA:  Hypoxia pleural effusion EXAM: CHEST - 2 VIEW COMPARISON:  09/28/2021, CT 09/10/2021 FINDINGS: Near complete opacification of the left thorax likely due to progressed pleural effusion and airspace disease. Mild shift of mediastinal contents to the left suggesting some volume loss. Small right-sided effusion slightly increased. Cardiomegaly with vascular congestion and probable mild edema. IMPRESSION: Worsening bilateral effusions and left airspace disease, now with near complete white out left thorax, there is a small amount of aerated lung  at the left apex. Cardiomegaly with vascular congestion and probable mild edema. Electronically Signed   By: Donavan Foil M.D.   On: 10/03/2021 18:56   Recent Labs    10/03/21 0400  WBC 6.0  HGB 9.1*  HCT 29.6*  PLT 259    Recent Labs    10/03/21 0400 10/04/21 0337  NA 136 134*  K 2.7* 3.7  CL 92* 92*  CO2 36* 36*  GLUCOSE 92 99  BUN 12 13  CREATININE 0.55* 0.51*  CALCIUM 8.5* 8.7*     Intake/Output Summary (Last 24 hours) at 10/05/2021 4132 Last data filed at 10/05/2021 0900 Gross per 24 hour  Intake 240 ml  Output 600 ml  Net -360 ml        Physical Exam: Vital Signs Blood pressure 124/66, pulse 84, temperature 97.6 F (36.4 C), resp. rate 18, height 5\' 10"  (1.778 m), weight 74 kg, SpO2 97 %.  Constitutional: No distress . Vital signs reviewed. HEENT: NCAT, EOMI, oral membranes moist Neck: supple Cardiovascular: RRR without murmur. No JVD    Respiratory/Chest: rhonchi on right. Decreaesd sounds left   GI/Abdomen: BS +, non-tender, non-distended Ext: no clubbing, cyanosis Psych: pleasant and cooperative  Musculoskeletal:     low back pain. Pain left scapula--not much change.    . LE edema improvedr, trace at left elbow Neurological:     Comments: Intact to light touch in all 4 extremities B/L  CN's intact on exam. Good insight and awareness.  Ox3- appropriate   RUE- 5-/5 in deltoid, biceps, triceps, WE, grip and FA LUE- deltoid 2-/5; weak vs pain; Biceps 4-/5; Triceps 3-/5; WE 4-/5 grip 4-/5; FA 4-/5 RLE- 4+/5 in HF, KE, KF DF and PF LLE- Proximal 3to 3+5; distal 4-/5--stable appearance Skin:    General: Skin is warm and dry.     groin and abdominal wounds are CDI       Assessment/Plan: 1. Functional deficits which require 3+ hours per day of interdisciplinary therapy in a comprehensive inpatient rehab setting. Physiatrist is providing close team supervision and 24 hour management of active medical problems listed below. Physiatrist and rehab team  continue to assess barriers to discharge/monitor patient progress toward functional and medical goals  Care Tool:  Bathing    Body parts bathed by patient: Right arm, Left arm, Chest, Face, Abdomen, Front perineal area, Right upper leg, Left upper leg, Right lower leg, Left lower leg, Buttocks   Body parts bathed by helper: Abdomen, Front perineal area, Buttocks, Right upper leg, Left upper leg, Right lower leg, Left lower leg     Bathing assist Assist Level: Minimal Assistance - Patient > 75%     Upper Body Dressing/Undressing Upper body dressing   What is the patient wearing?: Button up shirt    Upper body assist Assist Level: Minimal Assistance - Patient > 75%    Lower Body Dressing/Undressing Lower body dressing      What is the patient wearing?: Pants     Lower body assist Assist for lower body dressing: Moderate Assistance - Patient 50 - 74%     Toileting Toileting    Toileting assist Assist for toileting: Moderate Assistance - Patient 50 - 74%     Transfers Chair/bed transfer  Transfers assist  Chair/bed transfer activity did not occur: Refused  Chair/bed transfer assist level: Minimal Assistance - Patient > 75% Chair/bed transfer assistive device: Programmer, multimedia   Ambulation assist   Ambulation activity did not occur: Safety/medical concerns  Assist level: 2 helpers Assistive device: Walker-rolling Max distance: 50'   Walk 10 feet activity   Assist  Walk 10 feet activity did not occur: Safety/medical concerns  Assist level: 2 helpers Assistive device: Walker-rolling   Walk 50 feet activity   Assist Walk 50 feet with 2 turns activity did not occur: Safety/medical concerns  Assist level: 2 helpers Assistive device: Walker-rolling    Walk 150 feet activity   Assist Walk 150 feet activity did not occur: Safety/medical concerns         Walk 10 feet on uneven surface  activity   Assist Walk 10 feet on uneven  surfaces activity did not occur: Safety/medical concerns         Wheelchair     Assist Is the patient using a wheelchair?: Yes Type of Wheelchair: Manual    Wheelchair assist level: Supervision/Verbal cueing Max wheelchair distance: 40    Wheelchair 50 feet with 2 turns activity    Assist        Assist Level: Dependent - Patient 0%   Wheelchair 150 feet activity     Assist      Assist Level: Dependent - Patient 0%   Blood pressure 124/66, pulse 84, temperature 97.6 F (36.4 C), resp. rate 18, height 5\' 10"  (1.778 m), weight 74 kg, SpO2 97 %.  Assessment/Plan: Functional deficits secondary  to Debility from MSSA bactermia/PE/endovascular repair of aortic leak; PE and Previous styage IIIB lung CA with new hepatic and  bone metastases.  ELOS-  10/11/21 Pt may shower -Continue CIR therapies including PT, OT, and SLP       2. DVT/R lieofemoral DVT- Lovenox 70 mg BID- until CT in 4 weeks by Vascular with potential switch to oral 3. Pain Management: Tylenola nd Percocet as needed for pain- for LUE/LLE and back pain .   12/12 prn percocet,  Restarted Gabapentin 300 mg QHS-   12/24 continue scheduled percocet in am and kpad   -dc'ed lidocaine patch and are trying aspercreme over L shoulder   -continue flector for back  12/27 added second scheduled dose of oxycodoen at 1200 daily 4. Mood:-  Palliative care following due to his cancer and ego support as required-  ?neuropsychology    5. Skin/Wound Care: encouraging nutrition  -wounds healing 6. . Fluids/Electrolytes/Nutrition: encourage PO -pt is drinking protein supps and eating fairly well -weights stable last week, recheck tomorrow  7. MSSA bacteremia- is on Cefazolin q8 hours for 4 weeks from 09/09/21 -thoracentesis cultures are negative. Fungal cx with gs still pending  8. Persistent Afib- is recent diagnosis- Metoprolol just increased to 75 mg BID and on Diltiazem 120 mg daily- monitor for hypotension and hold  meds if BP <100.    Vitals:   10/05/21 0820 10/05/21 0903  BP:    Pulse:    Resp:    Temp:    SpO2: 90% 97%    -appreciate cards consult and recs.  -rec transition to eliquis 5mg  bid from lovenox (see #2) -cards has signed off-->outpt f/u with Dr. Sallyanne Kuster 9. S/p aortic leak/endovascular repair- periaortic hematoma- vascular to reorder at CT in 4 weeks to follow up 10. Previous Stage IIIB Lung CA- with new hepatic and bone mets- likely Stage IV-  -palliative care follow up appreciated, discussed palliative care with wife- she has reached out to patient's PCP to discuss.  -pt is DNR - F/U with Oncology and maybe hospice after d/c.  12/28 continued DOE  -continue oxygen  -xopenex and pulmicort scheduled HHN have helped  -decreased xopenex to bid along with pulmicort  -  scheduled mucinex dm to help him mobilize secretions  -CXR 12/26 demonstrates complete white-out of left lung   -pulmonary performed thoracentesis this morning    -1300cc of straw colored fluid drained    -appreciate pulmonary assist 11. Constipation-    Last bm 12/21--->sorbitol today 12/24 12. PICC line for IV ABX- as of 09/17/21 13. LE Edema--improved with ACE wraps and elevation, nutrition 14. Hypokalemia: supplemented today      LOS: 18 days A FACE TO [place] 10/05/2021, 9:28 AM

## 2021-10-05 NOTE — Progress Notes (Addendum)
Patient ID: Leon Lyons, male   DOB: [birth year], 87 y.o.   MRN: [id number]  SW faxed order and clinicals to [facility] (p:[phone]/f:[phone]). SW left message for intake dept to confirm if order and clinicals received.  *Kendra/Intake confirmed all documents received.   SW made palliative care referral with Margie/Intake with [place]  [phone]). Will follow-up with pt wife Leon Lyons.  Loralee Pacas, MSW, [place] Office: [phone] Cell: [phone] Fax: [phone]

## 2021-10-06 DIAGNOSIS — R5381 Other malaise: Secondary | ICD-10-CM | POA: Diagnosis not present

## 2021-10-06 LAB — BASIC METABOLIC PANEL
Anion gap: 5 (ref 5–15)
BUN: 18 mg/dL (ref 8–23)
CO2: 43 mmol/L — ABNORMAL HIGH (ref 22–32)
Calcium: 8.5 mg/dL — ABNORMAL LOW (ref 8.9–10.3)
Chloride: 85 mmol/L — ABNORMAL LOW (ref 98–111)
Creatinine, Ser: 0.61 mg/dL (ref 0.61–1.24)
GFR, Estimated: >60 mL/min (ref >60)
Glucose, Bld: 155 mg/dL — ABNORMAL HIGH (ref 70–99)
Potassium: 3.5 mmol/L (ref 3.5–5.1)
Sodium: 133 mmol/L — ABNORMAL LOW (ref 135–145)

## 2021-10-06 LAB — CYTOLOGY - NON PAP

## 2021-10-06 LAB — MAGNESIUM: Magnesium: 1.7 mg/dL (ref 1.7–2.4)

## 2021-10-06 MED ORDER — POTASSIUM CHLORIDE CRYS ER 20 MEQ PO TBCR
40.0000 meq | EXTENDED_RELEASE_TABLET | Freq: Once | ORAL | Status: AC
  Administered 2021-10-06: 40 meq via ORAL
  Filled 2021-10-06: qty 2

## 2021-10-06 MED ORDER — SORBITOL 70 % SOLN
60.0000 mL | Freq: Once | Status: AC
  Administered 2021-10-06: 11:00:00 60 mL via ORAL
  Filled 2021-10-06 (×2): qty 60

## 2021-10-06 MED ORDER — FUROSEMIDE 10 MG/ML IJ SOLN
40.0000 mg | Freq: Four times a day (QID) | INTRAMUSCULAR | Status: AC
  Administered 2021-10-07: 08:00:00 40 mg via INTRAVENOUS
  Filled 2021-10-06: qty 4

## 2021-10-06 NOTE — Progress Notes (Signed)
Physical Therapy Weekly Progress Note  Patient Details  Name: Leon Lyons MRN: [id number] Date of Birth: June 30, 1933  Beginning of progress report period: September 27, 2021 End of progress report period: October 06, 2021  Today's Date: 10/06/2021 PT Individual Time: 1000-1045 and 1346-1430 PT Individual Time Calculation (min): 45 min and 44 min  Patient has met 4 of 4 short term goals.  Pt progressing well toward mobility goals, improving independence with bed mobility, balance, transfers, and ambulation with RW. Pt limited by strength and endurance deficits including oxygen requirements related to pulmonary function. [name] present for every session and will not reuqire formal education prior to DC.  Patient continues to demonstrate the following deficits muscle weakness, decreased cardiorespiratoy endurance and decreased oxygen support, and decreased sitting balance, decreased standing balance, decreased postural control, and decreased balance strategies and therefore will continue to benefit from skilled PT intervention to increase functional independence with mobility.  Patient progressing toward long term goals..  Continue plan of care.  PT Short Term Goals Week 2:  PT Short Term Goal 1 (Week 2): Pt will perform bed mobility consistently with minA. PT Short Term Goal 1 - Progress (Week 2): Met PT Short Term Goal 2 (Week 2): Pt will perform sit to stand consistently with minA. PT Short Term Goal 2 - Progress (Week 2): Met PT Short Term Goal 3 (Week 2): Pt will perform bed to chair transfer consistently with minA. PT Short Term Goal 3 - Progress (Week 2): Met PT Short Term Goal 4 (Week 2): Pt will ambulate x150' with minA and LRAD. PT Short Term Goal 4 - Progress (Week 2): Met PT Short Term Goal 5 (Week 2): STGs = LTGs Week 3:     Skilled Therapeutic Interventions/Progress Updates:  Ambulation/gait training;Community reintegration;DME/adaptive equipment  instruction;Neuromuscular re-education;Psychosocial support;Stair training;UE/LE Strength taining/ROM;Wheelchair propulsion/positioning;Balance/vestibular training;Discharge planning;Pain management;Functional electrical stimulation;Skin care/wound management;Therapeutic Activities;UE/LE Coordination activities;Cognitive remediation/compensation;Disease management/prevention;Functional mobility training;Patient/family education;Splinting/orthotics;Therapeutic Exercise;Visual/perceptual remediation/compensation   1st Session: Pt received seated in [facility] and agrees to therapy. Reports pain in low back. Chronic in nature and number not provided. PT provided rest breaks as needed to manage pain. Pt's vitals checked and O2 at 100% on 2L O2. Pt performs sit to stand with minA and cues for hand placement, initiation, anterior weight shift, and power up. Pt ambulates x65' with primarily CGA but requiring modA just prior to sitting down due to LOB forward. Following seated rest break, pt ambulates additional x140' with CGA and LOBs. Pt reporting significant fatigue and back pain following 2nd bout of ambulation and requests to lie supine in bed as this is the only position that relieves his back pain. Stand step transfer back to bed with minA and RW. PT has discussion with pt and wife regarding palliative care benefits and family listens attentively. Pt left with alarm intact and all needs within reach.  2nd Session: Pt received supine in bed and agrees to therapy. Reports pain in low back but improved from earlier session. Number not provided. PT provides mobility and rest breaks to manage pain. Pt performs supine to sit with verbal cues for sequencing. Pt performs sit to stand and stand step transfer to [facility] with minA and cues for anterior weight transition and body mechanics. WC transport outside for community reintegration and ambulation over unlevel and varying surfaces. Pt performs sit to stand to RW with minA, and  ambulates 2x50' with seated rest break. Pt requires minA due to increased postural sway with ambulation over unlevel ground  bu tno overt LOBs. WC transport back to room. Stand step transfer back to bed with minA and cues for positioning. Pt left supine with alarm intact and all needs within reach.  Therapy Documentation Precautions:  Precautions Precautions: Fall, Other (comment) Precaution Comments: monitor BP, HR, and SpO2; possible L shoulder RC injury ? Restrictions Weight Bearing Restrictions: No   Therapy/Group: Individual Therapy  Breck Coons, PT, DPT 10/06/2021, 4:03 PM

## 2021-10-06 NOTE — Progress Notes (Signed)
Occupational Therapy Session Note  Patient Details  Name: Leon Lyons MRN: [id number] Date of Birth: [birth year]  Today's Date: 10/06/2021 OT Individual Time: 1115-1145 OT Individual Time Calculation (min): 30 min    Short Term Goals: Week 1:  OT Short Term Goal 1 (Week 1): Pt will perform sit to stand with mod A 50% of time without the STEDY for clothing mangement OT Short Term Goal 1 - Progress (Week 1): Met OT Short Term Goal 2 (Week 1): Pt will be able to transfer with mod A 50% of the time to the toilet/ BSC OT Short Term Goal 2 - Progress (Week 1): Met OT Short Term Goal 3 (Week 1): PT will be able to thread LB clothing with min A with AE prn OT Short Term Goal 3 - Progress (Week 1): Progressing toward goal Week 2:  OT Short Term Goal 1 (Week 2): PT will be able to thread LB clothing with min A with AE prn OT Short Term Goal 1 - Progress (Week 2): Met OT Short Term Goal 2 (Week 2): Pt will consistently stand with mod A at the sink in preparation for BADL tasks OT Short Term Goal 2 - Progress (Week 2): Met OT Short Term Goal 3 (Week 2): Patient will demonstrate R elbow ROM for edema management with min cues OT Short Term Goal 3 - Progress (Week 2): Met OT Short Term Goal 4 (Week 2): Patient will complete 1 step of toileting task Week 3:  OT Short Term Goal 1 (Week 3): LTG=STG 2/2 ELOS  Skilled Therapeutic Interventions/Progress Updates:    The pt was in bed upon arriving, the pt completed a simple BADL related task sitting EOB , he was able to wash his face, brush his teeth, and comb his hair. The pt was confused this treatment session, he was trying to use the call light as  a phone to call his wife and was easily agitated when I attempted to clarify, further it appeared as though verbalizing over stimulated the patient.  I attempted to verbalize instructions using 3-5 words for effective carryover. The pt completed a towel exercise incorporating BUE in various planes with rest  breaks as needed.  The pt require 1 rest break and returned to bed with his bedside table, call light, and alarm activated.  The pt had no pain c/o pain this treatment session and all additional needs were addressed.  Therapy Documentation Precautions:  Precautions Precautions: Fall, Other (comment) Precaution Comments: monitor BP, HR, and SpO2; possible L shoulder RC injury ? Restrictions Weight Bearing Restrictions: No General: General OT Amount of Missed Time: 60 Minutes Vital Signs:  Pain: Pain Assessment Pain Scale: 0-10 Pain Score: 8  Faces Pain Scale: No hurt PAINAD (Pain Assessment in Advanced Dementia) Breathing: normal Negative Vocalization: none Facial Expression: smiling or inexpressive Body Language: relaxed Consolability: no need to console PAINAD Score: 0 Critical Care Pain Observation Tool (CPOT) Facial Expression: Relaxed, neutral Vision   Perception    Praxis   Balance   Exercises:   Other Treatments:     Therapy/Group: Individual Therapy  Yvonne Kendall 10/06/2021, 1:12 PM

## 2021-10-06 NOTE — Progress Notes (Signed)
Occupational Therapy Note  Patient Details  Name: Leon Lyons MRN: [id number] Date of Birth: 1933/02/11  Today's Date: 10/06/2021 OT Missed Time: 51 Minutes Missed Time Reason: Pain  Patient indicated that he was in to much pain to participate.and refused OT treatment with wife present at the time of treatment.    Yvonne Kendall 10/06/2021, 1:05 PM

## 2021-10-06 NOTE — Progress Notes (Signed)
Albumin administered twice during PM Shift and verified with LPN Tiffany for accuracy. Pt tolerated first dose and 2nd dose is infusing. Pt is alert and oriented to baseline. Resp even and unlabored, and denies any distress. Vitals are WNL.

## 2021-10-06 NOTE — Telephone Encounter (Signed)
[name] called back to schedule, but call was disconnected. LMTCB to schedule hospital follow-up appointment at [facility] office.

## 2021-10-06 NOTE — Progress Notes (Signed)
PROGRESS NOTE   Subjective/Complaints: Pt doesn't notice a big difference in breathing this morning. Still with back and left scapular pain  ROS: Patient denies fever, rash, sore throat, blurred vision, nausea, vomiting, diarrhea,  chest pain,   headache, or mood change.   Objective:   DG Chest 1 View  Result Date: 10/05/2021 CLINICAL DATA:  Recent thoracentesis EXAM: CHEST  1 VIEW COMPARISON:  Chest x-ray 10/03/2021 FINDINGS: Heart is mildly enlarged. Mediastinum appears grossly stable. Tortuous thoracic aorta with calcified plaques in the arch. Right PICC tip in the right atrium. Interval improved aeration of the left hemithorax secondary to decreased pleural effusion and atelectasis. There is a persistent small to moderate left-sided pleural effusion and infiltrates in the aerated mid lung zone. Persistent small to moderate right pleural effusion with associated hazy opacities in the right lower lung zone likely representing layering effusion and atelectasis. No pneumothorax. IMPRESSION: 1. Small to moderate bilateral pleural effusions with associated atelectasis/infiltrate. Improved aeration of the left lung since previous study. 2. No pneumothorax visualized. Electronically Signed   By: Ofilia Neas M.D.   On: 10/05/2021 08:15   CT CHEST WO CONTRAST  Result Date: 10/05/2021 CLINICAL DATA:  Pleural mass EXAM: CT CHEST WITHOUT CONTRAST TECHNIQUE: Multidetector CT imaging of the chest was performed following the standard protocol without IV contrast. COMPARISON:  Chest radiograph 10/05/2021.  CT chest 09/10/2021 FINDINGS: Cardiovascular: Mild cardiac enlargement. No pericardial effusion. Coronary artery and aortic calcification. No aortic aneurysm. Central venous catheter with tip at the cavoatrial junction region. Mediastinum/Nodes: Evaluation mediastinal structures is limited without IV contrast material. Scattered lymph nodes are  not pathologically enlarged. Esophagus is decompressed. Thyroid gland is unremarkable. Lungs/Pleura: Moderate to large bilateral pleural effusions, increasing since prior study, particularly on the right side. Consolidation and volume loss in both lung bases, again progressing mostly on the right. Patchy airspace infiltrates are developing in the right lung and persist on the left. Interstitial thickening is increasing, suggesting edema. Changes suggest developing edema and/or multifocal pneumonia superimposed on to the pre-existing left lung neoplasm and radiation changes. Upper Abdomen: Poorly defined low-attenuation lesions in the liver are likely metastatic. Visualization is limited without IV contrast material. Musculoskeletal: Lytic metastasis with soft tissue component involving the left scapula and left ribs without change. IMPRESSION: 1. Underlying left lung neoplasm with consolidation and volume loss in the left lower lung and left lung infiltration consistent with radiation changes. These features are similar to the prior study. 2. Increasing bilateral pleural effusions, particularly on the right with basilar atelectasis. Increasing patchy airspace disease in the lungs with increasing interstitial changes. Changes likely represent superimposed and progressing edema/multifocal pneumonia on top of the chronic neoplastic and radiation changes. 3. Stable appearance of bone metastasis in the left rib and left scapula. 4. Probable hepatic metastasis. Electronically Signed   By: Lucienne Capers M.D.   On: 10/05/2021 17:29   No results for input(s): WBC, HGB, HCT, PLT in the last 72 hours.   Recent Labs    10/04/21 0337  NA 134*  K 3.7  CL 92*  CO2 36*  GLUCOSE 99  BUN 13  CREATININE 0.51*  CALCIUM 8.7*  Intake/Output Summary (Last 24 hours) at 10/06/2021 1100 Last data filed at 10/06/2021 0400 Gross per 24 hour  Intake 911 ml  Output 4750 ml  Net -3839 ml        Physical  Exam: Vital Signs Blood pressure (!) 116/54, pulse 98, temperature 97.6 F (36.4 C), temperature source Oral, resp. rate 18, height 5\' 10"  (1.778 m), weight 74 kg, SpO2 98 %.  Constitutional: No distress . Vital signs reviewed. HEENT: NCAT, EOMI, oral membranes moist Neck: supple Cardiovascular: RRR without murmur. No JVD    Respiratory/Chest: some sounds in upper left lobe, scattered rhonchi, fair effort of breathing GI/Abdomen: BS +, non-tender, non-distended Ext: no clubbing, cyanosis, or edema Psych: pleasant and cooperative  Musculoskeletal:     low back pain. Pain left scapula--stable   . LE edema improved, trace at left elbow Neurological:     Comments: Intact to light touch in all 4 extremities B/L  CN's intact on exam. Good insight and awareness. Ox3- appropriate   RUE- 5-/5 in deltoid, biceps, triceps, WE, grip and FA LUE- deltoid 2-/5; weak vs pain; Biceps 4-/5; Triceps 3-/5; WE 4-/5 grip 4-/5; FA 4-/5 RLE- 4+/5 in HF, KE, KF DF and PF LLE- Proximal 3to 3+5; distal 4-/5--stable appearance Skin:    General: Skin is warm and dry.     groin and abdominal wounds are CDI       Assessment/Plan: 1. Functional deficits which require 3+ hours per day of interdisciplinary therapy in a comprehensive inpatient rehab setting. Physiatrist is providing close team supervision and 24 hour management of active medical problems listed below. Physiatrist and rehab team continue to assess barriers to discharge/monitor patient progress toward functional and medical goals  Care Tool:  Bathing    Body parts bathed by patient: Right arm, Left arm, Chest, Face, Abdomen, Front perineal area, Right upper leg, Left upper leg, Right lower leg, Left lower leg, Buttocks   Body parts bathed by helper: Abdomen, Front perineal area, Buttocks, Right upper leg, Left upper leg, Right lower leg, Left lower leg     Bathing assist Assist Level: Minimal Assistance - Patient > 75%     Upper Body  Dressing/Undressing Upper body dressing   What is the patient wearing?: Button up shirt    Upper body assist Assist Level: Minimal Assistance - Patient > 75%    Lower Body Dressing/Undressing Lower body dressing      What is the patient wearing?: Pants     Lower body assist Assist for lower body dressing: Moderate Assistance - Patient 50 - 74%     Toileting Toileting    Toileting assist Assist for toileting: Moderate Assistance - Patient 50 - 74%     Transfers Chair/bed transfer  Transfers assist  Chair/bed transfer activity did not occur: Refused  Chair/bed transfer assist level: Minimal Assistance - Patient > 75% Chair/bed transfer assistive device: Programmer, multimedia   Ambulation assist   Ambulation activity did not occur: Safety/medical concerns  Assist level: 2 helpers Assistive device: Walker-rolling Max distance: 50'   Walk 10 feet activity   Assist  Walk 10 feet activity did not occur: Safety/medical concerns  Assist level: 2 helpers Assistive device: Walker-rolling   Walk 50 feet activity   Assist Walk 50 feet with 2 turns activity did not occur: Safety/medical concerns  Assist level: 2 helpers Assistive device: Walker-rolling    Walk 150 feet activity   Assist Walk 150 feet activity did not occur: Safety/medical concerns  Walk 10 feet on uneven surface  activity   Assist Walk 10 feet on uneven surfaces activity did not occur: Safety/medical concerns         Wheelchair     Assist Is the patient using a wheelchair?: Yes Type of Wheelchair: Manual    Wheelchair assist level: Supervision/Verbal cueing Max wheelchair distance: 40    Wheelchair 50 feet with 2 turns activity    Assist        Assist Level: Dependent - Patient 0%   Wheelchair 150 feet activity     Assist      Assist Level: Dependent - Patient 0%   Blood pressure (!) 116/54, pulse 98, temperature 97.6 F (36.4 C),  temperature source Oral, resp. rate 18, height 5\' 10"  (1.778 m), weight 74 kg, SpO2 98 %.  Assessment/Plan: Functional deficits secondary  to Debility from MSSA bactermia/PE/endovascular repair of aortic leak; PE and Previous styage IIIB lung CA with new hepatic and bone metastases.  ELOS-  10/11/21 Pt may shower -Continue CIR therapies including PT, OT       2. DVT/R lieofemoral DVT- Lovenox 70 mg BID- until CT in 4 weeks by Vascular with potential switch to oral 3. Pain Management: Tylenola nd Percocet as needed for pain- for LUE/LLE and back pain .   12/12 prn percocet,  Restarted Gabapentin 300 mg QHS-   12/24 continue scheduled percocet in am and kpad   -dc'ed lidocaine patch and are trying aspercreme over L shoulder   -continue flector for back  12/27 added second scheduled dose of oxycodone at 1200 daily 4. Mood:-  Palliative care following due to his cancer and ego support as required-  ?neuropsychology    5. Skin/Wound Care: encouraging nutrition  -wounds healing 6. . Fluids/Electrolytes/Nutrition: encourage PO -pt is drinking protein supps and eating fairly well -weights stable last week, recheck tomorrow  7. MSSA bacteremia- is on Cefazolin q8 hours for 4 weeks from 09/09/21 -thoracentesis cultures are negative. Fungal cx with gs still pending  8. Persistent Afib- is recent diagnosis- Metoprolol just increased to 75 mg BID and on Diltiazem 120 mg daily- monitor for hypotension and hold meds if BP <100.    Vitals:   10/06/21 0754 10/06/21 0755  BP:    Pulse:    Resp:    Temp:    SpO2: 98% 98%    -appreciate cards consult and recs.  -rec transition to eliquis 5mg  bid from lovenox (see #2) -cards has signed off-->outpt f/u with Dr. Sallyanne Kuster 9. S/p aortic leak/endovascular repair- periaortic hematoma- vascular to reorder at CT in 4 weeks to follow up 10. Previous Stage IIIB Lung CA- with new hepatic and bone mets- likely Stage IV-  -palliative care follow up appreciated,  discussed palliative care with wife- she has reached out to patient's PCP to discuss.  -pt is DNR - F/U with Oncology and maybe hospice after d/c.   - continued DOE  -continue oxygen  -xopenex and pulmicort scheduled HHN have helped  -decreased xopenex to bid along with pulmicort  -  scheduled mucinex dm to help him mobilize secretions  -CXR 12/26 demonstrates complete white-out of left lung   -pulmonary performed thoracentesis12/28    -1300cc of straw colored fluid drained  12/29--albumin/lasix per PCCM    -CT reveals mass, fluid, infiltrate, scarring in lung 11. Constipation-    12/29 needs bm---> sorbitol, SSE if needed 12. PICC line for IV ABX- as of 09/17/21 13. LE Edema--improved with ACE wraps and elevation,  nutrition 14. Hypokalemia: supplementing      LOS: 19 days A FACE TO FACE EVALUATION WAS PERFORMED  Meredith Staggers 10/06/2021, 11:00 AM

## 2021-10-06 NOTE — Progress Notes (Signed)
10/06/2021   I have seen and evaluated the patient for respiratory failure.  S:  Feeling better with diuresis and after thora, labs looks c/w transudate.  CT reviewed.  O: Blood pressure (!) 115/54, pulse 75, temperature 97.6 F (36.4 C), temperature source Oral, resp. rate 14, height 5\' 10"  (1.778 m), weight 74 kg, SpO2 95 %.  No distress Has rhonci at bases worse on left Weak but moves all 4 ext  A:  Acute hypoxemic respiratory failure due to combination residual tumor vs. Radiation scarring on left as well as volume overloaded state of heart.  P:  - Check BMP and AM CXR, push diuresis if CXR still looks wet and Cr okay - f/u pleural fluid cytology but initial chemistries pretty benign - Encourage IS, try to wean to room air prior to  DC - Will need f/u with Dr. Freddy Finner MD [facility] [place] epic messenger for cross cover needs If after hours, please call E-link

## 2021-10-06 NOTE — Progress Notes (Incomplete)
Occupational Therapy Note  Patient Details  Name: Leon Lyons MRN: [id number] Date of Birth: 06/15/1933  Today's Date: 10/06/2021 OT Individual Time:  -      ***   Yvonne Kendall 10/06/2021, 1:04 PM

## 2021-10-06 NOTE — Progress Notes (Signed)
Occupational Therapy Session Note  Patient Details  Name: Leon Lyons MRN: [id number] Date of Birth: 1933-03-25  Today's Date: 10/06/2021 OT Individual Time: 0820-0900 OT Individual Time Calculation (min): 40 min    Short Term Goals: Week 3:  OT Short Term Goal 1 (Week 3): LTG=STG 2/2 ELOS  Skilled Therapeutic Interventions/Progress Updates:    Pt greeted semi-reclined in bed and agreeable to OT treatment session focused on family education, self-care retraining, sit<>stand, and activity tolerance. Pt completed bed mobility with min A to elevate trunk. Nursing administered medications in sitting and MD entered room to round. Pt on 2L of O2 throughout session. OT assistance to don TED hose. Worked on LB dressing reaching forward to thread pants with min A. OT educated wife on safety, gait belt, and body mechanics of stand-pivot transfers bed<>wc<>BSC. Pt needed min A and increased time to stand with assistance from spouse. Pt unable to remove UE to assist with pulling up pants due to min A for balance. Pt then pivoted to wc with CGA. Practiced sit<>stands from wc w/ RW and min A x4. Pt then took steps forward and back, and 10 marches in place. OT then had pt remove B UE's from RW to hug his spouse. Pt needed CGA for balance. Pt returned to sitting and left seated in wc with needs met and spouse present. OT also added O2 line extender.   Therapy Documentation Precautions:  Precautions Precautions: Fall, Other (comment) Precaution Comments: monitor BP, HR, and SpO2; possible L shoulder RC injury ? Restrictions Weight Bearing Restrictions: No General:   Vital Signs: Oxygen Therapy SpO2: 98 % O2 Device: Nasal Cannula O2 Flow Rate (L/min): 2 L/min Pain:  Pain in back and L shoulder, rest and repositioned, nursing administered meds.   Therapy/Group: Individual Therapy  Valma Cava 10/06/2021, 9:10 AM

## 2021-10-07 DIAGNOSIS — R5381 Other malaise: Secondary | ICD-10-CM | POA: Diagnosis not present

## 2021-10-07 LAB — BASIC METABOLIC PANEL
Anion gap: 6 (ref 5–15)
BUN: 10 mg/dL (ref 8–23)
CO2: 42 mmol/L — ABNORMAL HIGH (ref 22–32)
Calcium: 8.8 mg/dL — ABNORMAL LOW (ref 8.9–10.3)
Chloride: 86 mmol/L — ABNORMAL LOW (ref 98–111)
Creatinine, Ser: 0.48 mg/dL — ABNORMAL LOW (ref 0.61–1.24)
GFR, Estimated: >60 mL/min (ref >60)
Glucose, Bld: 99 mg/dL (ref 70–99)
Potassium: 3.5 mmol/L (ref 3.5–5.1)
Sodium: 134 mmol/L — ABNORMAL LOW (ref 135–145)

## 2021-10-07 LAB — MAGNESIUM: Magnesium: 1.6 mg/dL — ABNORMAL LOW (ref 1.7–2.4)

## 2021-10-07 MED ORDER — MAGNESIUM SULFATE 2 GM/50ML IV SOLN
2.0000 g | Freq: Once | INTRAVENOUS | Status: AC
  Administered 2021-10-07: 13:00:00 2 g via INTRAVENOUS
  Filled 2021-10-07: qty 50

## 2021-10-07 MED ORDER — POTASSIUM CHLORIDE CRYS ER 20 MEQ PO TBCR
40.0000 meq | EXTENDED_RELEASE_TABLET | Freq: Once | ORAL | Status: AC
  Administered 2021-10-07: 12:00:00 40 meq via ORAL
  Filled 2021-10-07: qty 2

## 2021-10-07 NOTE — Consult Note (Signed)
This is a progress note    NAME:  Leon Lyons, MRN:  [id number], DOB:  10-Aug-1933, LOS: 36 ADMISSION DATE:  09/17/2021, CONSULTATION DATE:  10/04/21 REFERRING MD:  Naaman Plummer, CHIEF COMPLAINT:  SOB   History of Present Illness:  87 year old man with extensive recent PMH notable for Stage 3B lung cancer s/p chemoradiation, afib, PE on AC, recent volvulus surgery complicated by aortic leak who is now in rehab.  He has been getting more SOB lately with enlarging left pleural effusion.  Had tap back earlier this month with atypical cells thought to be more reactive.  PCCM consulted to assist with management.  Pertinent  Medical History   Past Medical History:  Diagnosis Date   Acute deep vein thrombosis (DVT) of right lower extremity (HCC)    Aortic rupture (contained)    a. 09/2021 s/p EVAR.   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 s/p surgical repair 07/2021.   History of echocardiogram    a. 09/10/2021 Echo: EF 60-65%, no rwma, triv MR, mod TR; b. 09/2021 Echo: EF 60-65%, no rwma, nl RV size/fxn, no LA/LAA thrombus, large left pl effusion, triv MR w/mild late MVP, mild-mod TR, mild AI, Ao sclerosis w/o stenosis, Gr IV plaque of Ao arch/desc Ao.   History of radiation therapy 03/24/2021   left lung   02/11/2021-03/24/2021  Dr Gery Pray   MSSA bacteremia    a. 09/2021 TEE: EF 60-65%, no vegetation.   Non-small cell lung cancer (HCC)    Persistent atrial fibrillation (HCC)    Pleural effusion, left    a. 09/2021 s/p thoracentesis - 82ml.   Pulmonary embolism ([place])    Remote Tobacco use    a. Quit 60 + yrs ago.     Significant Hospital Events: Including procedures, antibiotic start and stop dates in addition to other pertinent events   12/27 consulted 12/28 Left thoracentesis 1300cc straw colored fluid removed  Interim History / Subjective:  Unchanged left shoulder pain Remains on nasal cannula Decreased UOP however patient has declined lasix today and yesterday  Objective   Blood  pressure 117/68, pulse 89, temperature (!) 97.5 F (36.4 C), temperature source Oral, resp. rate 17, height 5\' 10"  (1.778 m), weight 74 kg, SpO2 99 %.        Intake/Output Summary (Last 24 hours) at 10/07/2021 0809 Last data filed at 10/07/2021 0600 Gross per 24 hour  Intake 120 ml  Output 1600 ml  Net -1480 ml   Filed Weights   09/17/21 1903 09/23/21 0609 09/24/21 1953  Weight: 79.5 kg 72.4 kg 74 kg    Physical Exam: General: Elderly, chronically ill-appearing, no acute distress HENT: [place], AT, OP clear, MMM, temporal wasting Eyes: EOMI, no scleral icterus Respiratory: Diminished breath sounds bilaterally.  No crackles, wheezing or rales Cardiovascular: RRR, -M/R/G, no JVD Extremities:-Edema,-tenderness Neuro: AAO x4, CNII-XII grossly intact Psych: Normal mood, normal affect  K 3.5 BUN/Cr 10/0.48  CT 10/05/21 Moderate to bilateral pleural effusions, bilateral patchy airspace disease with progression on right  Thora labs 12/28: Malignant cells consistent with non-small cell carcinoma Labs consistent with transudate   Resolved Hospital Problem list   N/a  Assessment & Plan:  Recurrent malignant bilateral pleural effusions  Stage IIIB NSCL cancer s/p chemoradiation, previously on consolidation IT, interrupted with hospitalizations since October 2022  Progression to stage IV with malignant effusions Recent MSSA bacteremia  CT reviewed with bilateral pleural effusions. Cytology from 10/05/21 thoracentesis showed malignant cells. Discussed repeat thoracentesis if fluid  reaccumulates. Depending on rate of fluid accumulation, we could consider PleurX. His wife will watch videos and decide if this is feasible for her to help with at home. For now, patient wishes to be conservative with thoracentesis as needed and even expressed that hospice would be reasonable as he wants to limit his suffering. If PleurX is warranted, he will re-discuss option at that time.  -Wean supplemental  O2 for goal >88% -Continue Pulmicort -Patient agreed to PO diuresis after counseling -CXR tomorrow  PCCM will continue to follow  Rodman Pickle, M.D. [facility] Pulmonary/Critical Care Medicine 10/07/2021 8:09 AM   See Amion for personal pager For hours between 7 PM to 7 AM, please call Elink for urgent questions

## 2021-10-07 NOTE — Progress Notes (Signed)
PROGRESS NOTE   Subjective/Complaints: Pt feeling ok. Breathing about the same. Still dealing with pain. C/o of "knot" in left calf which hurts when he's up moving  ROS: Patient denies fever, rash, sore throat, blurred vision, nausea, vomiting, diarrhea, cough, shortness of breath or chest pain,  headache, or mood change.   Objective:   CT CHEST WO CONTRAST  Result Date: 10/05/2021 CLINICAL DATA:  Pleural mass EXAM: CT CHEST WITHOUT CONTRAST TECHNIQUE: Multidetector CT imaging of the chest was performed following the standard protocol without IV contrast. COMPARISON:  Chest radiograph 10/05/2021.  CT chest 09/10/2021 FINDINGS: Cardiovascular: Mild cardiac enlargement. No pericardial effusion. Coronary artery and aortic calcification. No aortic aneurysm. Central venous catheter with tip at the cavoatrial junction region. Mediastinum/Nodes: Evaluation mediastinal structures is limited without IV contrast material. Scattered lymph nodes are not pathologically enlarged. Esophagus is decompressed. Thyroid gland is unremarkable. Lungs/Pleura: Moderate to large bilateral pleural effusions, increasing since prior study, particularly on the right side. Consolidation and volume loss in both lung bases, again progressing mostly on the right. Patchy airspace infiltrates are developing in the right lung and persist on the left. Interstitial thickening is increasing, suggesting edema. Changes suggest developing edema and/or multifocal pneumonia superimposed on to the pre-existing left lung neoplasm and radiation changes. Upper Abdomen: Poorly defined low-attenuation lesions in the liver are likely metastatic. Visualization is limited without IV contrast material. Musculoskeletal: Lytic metastasis with soft tissue component involving the left scapula and left ribs without change. IMPRESSION: 1. Underlying left lung neoplasm with consolidation and volume loss in  the left lower lung and left lung infiltration consistent with radiation changes. These features are similar to the prior study. 2. Increasing bilateral pleural effusions, particularly on the right with basilar atelectasis. Increasing patchy airspace disease in the lungs with increasing interstitial changes. Changes likely represent superimposed and progressing edema/multifocal pneumonia on top of the chronic neoplastic and radiation changes. 3. Stable appearance of bone metastasis in the left rib and left scapula. 4. Probable hepatic metastasis. Electronically Signed   By: Lucienne Capers M.D.   On: 10/05/2021 17:29   No results for input(s): WBC, HGB, HCT, PLT in the last 72 hours.   Recent Labs    10/06/21 1613 10/07/21 0540  NA 133* 134*  K 3.5 3.5  CL 85* 86*  CO2 43* 42*  GLUCOSE 155* 99  BUN 18 10  CREATININE 0.61 0.48*  CALCIUM 8.5* 8.8*     Intake/Output Summary (Last 24 hours) at 10/07/2021 1052 Last data filed at 10/07/2021 8099 Gross per 24 hour  Intake 240 ml  Output 1600 ml  Net -1360 ml        Physical Exam: Vital Signs Blood pressure 137/65, pulse 87, temperature (!) 97.5 F (36.4 C), temperature source Oral, resp. rate 17, height 5\' 10"  (1.778 m), weight 74 kg, SpO2 99 %.  Constitutional: No distress . Vital signs reviewed. HEENT: NCAT, EOMI, oral membranes moist Neck: supple Cardiovascular: RRR without murmur. No JVD    Respiratory/Chest: CTA Bilaterally without wheezes or rales. Normal effort    GI/Abdomen: BS +, non-tender, non-distended Ext: no clubbing, cyanosis, or edema Psych: pleasant and cooperative  Musculoskeletal:  low back pain. Pain left scapula--stable   . LE edema improved, trace at left elbow    Fibrous knot in upper,medial calf.  ?scar tissue. Doesn't feel like muscle Neurological:     Comments: Intact to light touch in all 4 extremities B/L  CN's intact on exam. Good insight and awareness. Ox3- appropriate   RUE- 5-/5 in  deltoid, biceps, triceps, WE, grip and FA LUE- deltoid 2-/5; weak vs pain; Biceps 4-/5; Triceps 3-/5; WE 4-/5 grip 4-/5; FA 4-/5 RLE- 4+/5 in HF, KE, KF DF and PF LLE- Proximal 3to 3+5; distal 4-/5--stable Skin:    General: Skin is warm and dry.     groin and abdominal wounds are clean/dry       Assessment/Plan: 1. Functional deficits which require 3+ hours per day of interdisciplinary therapy in a comprehensive inpatient rehab setting. Physiatrist is providing close team supervision and 24 hour management of active medical problems listed below. Physiatrist and rehab team continue to assess barriers to discharge/monitor patient progress toward functional and medical goals  Care Tool:  Bathing    Body parts bathed by patient: Right arm, Left arm, Chest, Face, Abdomen, Front perineal area, Right upper leg, Left upper leg, Right lower leg, Left lower leg, Buttocks   Body parts bathed by helper: Abdomen, Front perineal area, Buttocks, Right upper leg, Left upper leg, Right lower leg, Left lower leg     Bathing assist Assist Level: Minimal Assistance - Patient > 75%     Upper Body Dressing/Undressing Upper body dressing   What is the patient wearing?: Button up shirt    Upper body assist Assist Level: Contact Guard/Touching assist    Lower Body Dressing/Undressing Lower body dressing      What is the patient wearing?: Pants     Lower body assist Assist for lower body dressing: Minimal Assistance - Patient > 75%     Toileting Toileting    Toileting assist Assist for toileting: Minimal Assistance - Patient > 75%     Transfers Chair/bed transfer  Transfers assist  Chair/bed transfer activity did not occur: Refused  Chair/bed transfer assist level: Minimal Assistance - Patient > 75% Chair/bed transfer assistive device: Programmer, multimedia   Ambulation assist   Ambulation activity did not occur: Safety/medical concerns  Assist level: 2  helpers Assistive device: Walker-rolling Max distance: 50'   Walk 10 feet activity   Assist  Walk 10 feet activity did not occur: Safety/medical concerns  Assist level: 2 helpers Assistive device: Walker-rolling   Walk 50 feet activity   Assist Walk 50 feet with 2 turns activity did not occur: Safety/medical concerns  Assist level: 2 helpers Assistive device: Walker-rolling    Walk 150 feet activity   Assist Walk 150 feet activity did not occur: Safety/medical concerns         Walk 10 feet on uneven surface  activity   Assist Walk 10 feet on uneven surfaces activity did not occur: Safety/medical concerns         Wheelchair     Assist Is the patient using a wheelchair?: Yes Type of Wheelchair: Manual    Wheelchair assist level: Supervision/Verbal cueing Max wheelchair distance: 40    Wheelchair 50 feet with 2 turns activity    Assist        Assist Level: Dependent - Patient 0%   Wheelchair 150 feet activity     Assist      Assist Level: Dependent - Patient 0%   Blood  pressure 137/65, pulse 87, temperature (!) 97.5 F (36.4 C), temperature source Oral, resp. rate 17, height 5\' 10"  (1.778 m), weight 74 kg, SpO2 99 %.  Assessment/Plan: Functional deficits secondary  to Debility from MSSA bactermia/PE/endovascular repair of aortic leak; PE and Previous styage IIIB lung CA with new hepatic and bone metastases.  ELOS-  10/11/21 Pt may shower -Continue CIR therapies including PT, OT      2. DVT/R lieofemoral DVT- Lovenox 70 mg BID- until CT in 4 weeks by Vascular with potential switch to oral 3. Pain Management: Tylenola nd Percocet as needed for pain- for LUE/LLE and back pain .   Gabapentin 300 mg QHS-   - scheduled percocet in am and at noon  -continue kpad, flector, muscle rub  12/30 recommended heat, stretching, massage to left calf knot. Don't think this is a TP. Appears to be fibrous, almost like scar tissue    4. Mood:-   Palliative care following due to his cancer and ego support as required-  ?neuropsychology    5. Skin/Wound Care: encouraging nutrition  -wounds healing 6. . Fluids/Electrolytes/Nutrition: encourage PO -pt is drinking protein supps and eating fairly well -weights stable last week, recheck tomorrow  7. MSSA bacteremia- is on Cefazolin q8 hours for 4 weeks from 09/09/21 -thoracentesis cultures are negative. Fungal cx with gs still pending  8. Persistent Afib- is recent diagnosis- Metoprolol just increased to 75 mg BID and on Diltiazem 120 mg daily- monitor for hypotension and hold meds if BP <100.    Vitals:   10/07/21 0824 10/07/21 0855  BP: 137/65   Pulse: (!) 126 87  Resp:    Temp:    SpO2:      -appreciate cards consult and recs.  -rec transition to eliquis 5mg  bid from lovenox (see #2) -cards has signed off-->outpt f/u with Dr. Sallyanne Kuster 9. S/p aortic leak/endovascular repair- periaortic hematoma- vascular to reorder at CT in 4 weeks to follow up 10. Previous Stage IIIB Lung CA- with new hepatic and bone mets- likely Stage IV-  -palliative care follow up appreciated, discussed palliative care with wife- she has reached out to patient's PCP to discuss.  -pt is DNR - F/U with Oncology and maybe hospice after d/c.   - continued DOE  -continue oxygen  -xopenex and pulmicort scheduled HHN have helped  -decreased xopenex to bid along with pulmicort  -  scheduled mucinex dm to help him mobilize secretions  -CXR 12/26 demonstrates complete white-out of left lung   -pulmonary performed thoracentesis12/28    -1300cc of straw colored fluid drained  12/30--albumin/lasix, diruesis per PCCM    -recent CT reveals mass, fluid, infiltrate, scarring in lung    -BUN/Cr holding today 11. Constipation-    - moved bowels 12/29 12. PICC line for IV ABX- as of 09/17/21 13. LE Edema--improved with ACE wraps and elevation, nutrition 14. Hypokalemia: supplementing      LOS: 20 days A FACE TO FACE  EVALUATION WAS PERFORMED  Meredith Staggers 10/07/2021, 10:52 AM

## 2021-10-07 NOTE — Progress Notes (Signed)
Patient ID: Leon Lyons, male   DOB: [birth year], 87 y.o.   MRN: [id number]  SW received updates from therapy team transport chair is needed.   *SW spoke with pt wife April and pt to inform on transport chair being ordered. SW informed will ensure he has oxygen at time of discharge. Sw informed on palliative care referral.   *SW ordered transport chair and oxygen tank for delivery here with [place] via parachute.   Loralee Pacas, MSW, [place] Office: [phone] Cell: [phone] Fax: [phone]

## 2021-10-07 NOTE — Progress Notes (Addendum)
Spoke to patient and wife this afternoon regarding goals of care. Reviewed the discussion they had with pulmonary medicine. At this point Mr. Leon Lyons does not want to unnecessarily prolong his life. He just wants some quality in the life he has left. Pulmonary medicine has made referral to Palliative Care to help with goals of care, hospice needs. SW already has him set up with [facility], [place] Director Rehabilitation Services 10/07/2021

## 2021-10-07 NOTE — Progress Notes (Signed)
Occupational Therapy Session Note  Patient Details  Name: Leon Lyons MRN: [id number] Date of Birth: 06-20-1933  Today's Date: 10/07/2021 OT Individual Time: 1105-1200 & 2831-5176 OT Individual Time Calculation (min): 55 min & 40 min   Short Term Goals: Week 3:  OT Short Term Goal 1 (Week 3): LTG=STG 2/2 ELOS  Skilled Therapeutic Interventions/Progress Updates:  Session 1 Skilled OT intervention completed with focus on discussion of POC, rehab goals, therapist providing psychosocial support and decision making, energy conservation education and bed level exercises. Pt received supine in bed, with pt's wife by pt's side with both appearing very emotional. [name] informing therapist that pt has chosen to deny medical intervention for excess fluid on his lungs and is wanting to seek comfort care only. Educated pt and family, that with transition of care that potentially d/c sooner might could occur if medically appropriate. Notified pt's primary therapists and care team about pt's wishes. Education provided to pt and family about ways to stay as independent as possible even if transitioning to palliative care. Energy conservation strategies handout provided to pt/pt's wife for maximizing independence once home. Pt politely declining OOB therapy, but agreeable to bed level exercises. Pt completed the following exercises semi-supine using yellow theraband for BUE endurance, needed for self-care tasks:  On RUE (with yellow band on bed rail)  - Shoulder flexion x10 - Bicep flexion x10 - Chest presses x10 - Shoulder horizontal adduction x10 - Shoulder horizontal abduction x10 - Shoulder internal rotation x10 - Shoulder external rotation x10  On LUE (limited due to potential RC injury) - Bicep flexion x10 - Chest presses x10 - Shoulder internal rotation x10  Heel slides x10 on each leg  Pt required intermittent rest breaks due to fatigue, and multimodal cues for form and positioning  throughout. [name] inquiring about how pt should sleep for back comfort, with side-sleeping education provided with pillows as support. Pt left supine in bed, with wife at bedside with bed alarm on and all needs in reach at end of session.   Session 2 Skilled OT intervention completed with focus on functional transfers, activity tolerance, and psychosocial well being. Pt received supine in bed, with wife at bedside. Staff in room to disconnect pt from IV. Pt initially politely declining OOB tasks, however motivated for change of scenery when therapist offered going to [name]	[name], so focus of therapy this session on changing pt's scenery and increasing participation at the level pt agreeable to complete. Pt with [name] on, 2L O2, and remained on 2L throughout rest of session. Pt completed bed mobility with min A for lifting trunk, then sit > stand using RW with min A, stand pivot to w/c with CGA. Therapist transported pt to [name] for visual scenery, with wife present. While seated with back unsupported, and both feet on ground, pt participated in cognitive task/dynamic sitting balance for BUE endurance needed for self-care and overall participation using pipe tower. Pt with min difficulty with task, with cues needed to recognize incorrect pieces, but able to choose correct one with guidance. Pt educated on using the breathing technique throughout tasks, even ones that only require concentration. Pt transported back to room, with total A, then left seated in w/c with NT in room changing sheets, wife in room, and all needs in reach at end of session.    Therapy Documentation Precautions:  Precautions Precautions: Fall, Other (comment) Precaution Comments: monitor BP, HR, and SpO2; possible L shoulder RC injury ? Restrictions  Weight Bearing Restrictions: No  Pain: Generalized pain, unrated- requested bed level exercises and no other intervention needed   Therapy/Group: Individual  Therapy  Dhanvin Szeto E Demarko Zeimet 10/07/2021, 7:57 AM

## 2021-10-07 NOTE — Progress Notes (Signed)
Note for Social Work. Patient was at 100% SPO2 with 2L of oxygen at rest. After two minutes at rest with no oxygen, patient's sat dropped down to 83%. After two minutes, oxygen was reinstated, oxygen saturation returned to 100%.    Gladstone Lighter, LPN

## 2021-10-07 NOTE — Progress Notes (Signed)
Occupational Therapy Session Note  Patient Details  Name: Leon Lyons MRN: [id number] Date of Birth: 1933-09-16  Today's Date: 10/07/2021 OT Individual Time: 2004-1593 OT Individual Time Calculation (min): 70 min    Short Term Goals: Week 3:  OT Short Term Goal 1 (Week 3): LTG=STG 2/2 ELOS  Skilled Therapeutic Interventions/Progress Updates:    Pt greeted semi-reclined in bed trying to eat breakfast, but  HOB too far down. Pt agreeable to get up to the wc for better positioning for breakfast consumption. Pt needed min A to get from sidelying to sit. He then completed sit<>stand w/ RW and min A with verbal cues for hand placement and anterior weight shift. Min A to pivot to wc w/ RW. Pt finished eating breakfast with OT assist to open containers. Pt on 2L of O2 with SpO2 at 100%. Addressed activity tolerance  and sit<>stands within bathing/dressing tasks from wc at the sink. OT weaned O2 to 1L, but pt desat to 85% with activity, so he was placed back on 2L. Pt required extended rest breaks after sit<>stands, but was able to tolerate standing to wash peri-area and buttocks, as well as pull up pants today -min A for balance. OT demonstrated use of sock-aid. Pt was able to then demonstrate understanding and don both socks with verbal cues for technique and increased time. Pt left seated in wc at end of session with spouse present and needs met.   Therapy Documentation Precautions:  Precautions Precautions: Fall, Other (comment) Precaution Comments: monitor BP, HR, and SpO2; possible L shoulder RC injury ? Restrictions Weight Bearing Restrictions: No  Pain: Pain Assessment Pain Scale: 0-10 Pain Score: 9  Faces Pain Scale: Hurts whole lot Pain Location: Back Pain Descriptors / Indicators: Aching;Burning Pain Frequency: Intermittent Pain Onset: On-going Patients Stated Pain Goal: 2 Pain Intervention(s): Rest, repositioned, nursing administered meds    Therapy/Group: Individual  Therapy  Valma Cava 10/07/2021, 8:49 AM

## 2021-10-07 NOTE — Progress Notes (Addendum)
Occupational Therapy Discharge Summary  Patient Details  Name: Leon Lyons MRN: [id number] Date of Birth: 11-Apr-1933  Today's Date: 10/10/2021 OT Individual Time: 3729-0211 OT Individual Time Calculation (min): 46 min   Patient greeted seated in wc with spouse present. Pt on 2L of O2 throughout session. OT educated pt's spouse on portable O2 tank, how to operate it (as she was not shown when dropped off) and helped calculate an estimated amount of time the tank could be used for. Wife Leon Lyons stated they did not call to schedule O2 drop off at home and she was here with Leon Lyons. She has been trying to call them since to get it re-scheduled, but he will need home O2 set-up to go home. Pt reported 8/10 back pain and requested to return to bed with min A stand-pivot w/ RW. OT educated wife on use of pillow wedge for bed positioning and hair washing tray for energy conservation as well. Discussed where she could purchase these things. Discussed home set-up and goals of care with pt and spouse. Pt reported he wants to be comfortable. OT provided emotional support to pt and spouse. Pt left semi-reclined in bed with spouse present and needs met.    Patient has met 10 of 10 long term goals due to improved activity tolerance, improved balance, postural control, ability to compensate for deficits, and functional use of  LEFT upper and LEFT lower extremity.  Patient to discharge at [facility] Assist level.  Patient's care partner is independent to provide the necessary physical assistance at discharge.    Reasons goals not met: n/a  Recommendation:  no further OT needs, palliative care involved  Equipment: Transport chair  Reasons for discharge: treatment goals met and discharge from hospital  Patient/family agrees with progress made and goals achieved: Yes  OT Discharge Precautions/Restrictions  Precautions Precautions: Fall Restrictions Weight Bearing Restrictions: No Pain Pain  Assessment Pain Scale: 0-10 Pain Score: 7  Pain Type: Chronic pain Pain Location: Back Pain Radiating Towards: shoulder Pain Descriptors / Indicators: Aching Pain Intervention(s): Medication (See eMAR)8/10 back pain, rest and repositioned ADL ADL Eating: Independent Grooming: Independent Where Assessed-Grooming: Sitting at sink Upper Body Bathing: Setup Where Assessed-Upper Body Bathing: Edge of bed Lower Body Bathing: Minimal assistance Where Assessed-Lower Body Bathing: Wheelchair Upper Body Dressing: Supervision/safety Where Assessed-Upper Body Dressing: Wheelchair Lower Body Dressing: Minimal assistance Toileting: Minimal assistance Toilet Transfer: Minimal assistance Toilet Transfer Method: Stand pivot Tub/Shower Transfer: Unable to assess Perception  Perception: Within Functional Limits Praxis Praxis: Intact Cognition Overall Cognitive Status: Within Functional Limits for tasks assessed Arousal/Alertness: Awake/alert Orientation Level: Oriented X4 Year: 2022 Month: December Day of Week: Correct Attention: Focused;Sustained Focused Attention: Appears intact Sustained Attention: Appears intact Selective Attention: Appears intact Immediate Memory Recall: Sock;Blue;Bed Memory Recall Sock: Not able to recall Memory Recall Blue: With Cue Memory Recall Bed: With Cue Awareness: Appears intact Problem Solving: Appears intact Safety/Judgment: Appears intact Sensation Sensation Light Touch: Appears Intact Coordination Gross Motor Movements are Fluid and Coordinated: No Fine Motor Movements are Fluid and Coordinated: Yes Coordination and Movement Description: Continues to have pain in L shoulder adn limited ROM, able to use functionally under 90 degrees pending pain Motor  Motor Motor - Discharge Observations: Continues with generalized weakness Mobility  Bed Mobility Supine to Sit: Minimal Assistance - Patient > 75% Sit to Supine: Minimal Assistance - Patient >  75% Transfers Sit to Stand: Minimal Assistance - Patient > 75% Stand to Sit: Minimal Assistance - Patient > 75%  Balance Balance Balance Assessed: Yes Static Sitting Balance Static Sitting - Balance Support: Feet supported Static Sitting - Level of Assistance: 5: Stand by assistance Dynamic Sitting Balance Dynamic Sitting - Balance Support: During functional activity Dynamic Sitting - Level of Assistance: 5: Stand by assistance Static Standing Balance Static Standing - Balance Support: During functional activity Static Standing - Level of Assistance: 4: Min assist Dynamic Standing Balance Dynamic Standing - Balance Support: During functional activity Dynamic Standing - Level of Assistance: 4: Min assist Extremity/Trunk Assessment RUE Assessment RUE Assessment: Within Functional Limits LUE Assessment Active Range of Motion (AROM) Comments: decr shoulder flexion to ~ 80 degrees, elbow and wrist and hand WFL   Leon Lyons 10/10/2021, 2:02 PM

## 2021-10-07 NOTE — Progress Notes (Signed)
Physical Therapy Session Note  Patient Details  Name: Leon Lyons MRN: [id number] Date of Birth: Apr 22, 1933  Today's Date: 10/07/2021 PT Individual Time: 0901-0958 PT Individual Time Calculation (min): 57 min   Short Term Goals: Week 3:     Skilled Therapeutic Interventions/Progress Updates:     Pt received seated in [facility] and agrees to therapy. Reports pain in low back and L shoulder. Number not provided. PT provides rest breaks as needed to manage pain. WC transport to gym for time management. Pt performs sit to stand with mina and cues for scooting to edge of WC and hand placement, as well as anterior weight shifting to optimize transfer. Pt performs stand step transfer to Nustep with cues for positioning and hand placement. Prior to starting Nustep activity, pt's vitals assessed on 2L O2 with O2 sats at 100%. Pt then completes Nustep in 3:00 increment at workload of 3 with average steps per minute ~40. Pt does not use L upper extremity due to shoulder pain attributed to rotator cuff. PT ace wraps L shoulder to provide approximation and pain relief and pt verbalizes some improvement. Pt's oxygen remains in high 90s during first bout, so PT titrates to 1L during 3 minute rest break. Pt performs 2nd bout and oxygen remains in mid to high 90s. Oxygen then removed and pt performs 3rd bout, with oxygen briefly dropping to 88% after 1:30, and quickly rebounding to >90% with cues for pursed lip breathing and pausing activity. Oxygen then returned to 2L. Pt verbalizes need to urinate  Stand step transfer from Nustep>WC>toilet with minA and cues for body mechanics. Pt stands to urinate with PT providing minA to position pelvis to effectively urinate into toilet. Following seated rest break, pt ambulates x140' with CGA and +2 WC follow, with cues to increase stride length to decrease risk for falls. Stand step transfer back to bed with HHA minA. Sit to supine with minA management of legs Pt left with alarm  intact and all needs within reach.  Therapy Documentation Precautions:  Precautions Precautions: Fall, Other (comment) Precaution Comments: monitor BP, HR, and SpO2; possible L shoulder RC injury ? Restrictions Weight Bearing Restrictions: No  Therapy/Group: Individual Therapy  Breck Coons, PT, DPT 10/07/2021, 10:57 AM

## 2021-10-08 ENCOUNTER — Inpatient Hospital Stay (HOSPITAL_COMMUNITY): Payer: Medicare Other

## 2021-10-08 DIAGNOSIS — R5381 Other malaise: Secondary | ICD-10-CM | POA: Diagnosis not present

## 2021-10-08 DIAGNOSIS — Z7189 Other specified counseling: Secondary | ICD-10-CM

## 2021-10-08 DIAGNOSIS — Z9889 Other specified postprocedural states: Secondary | ICD-10-CM | POA: Diagnosis not present

## 2021-10-08 DIAGNOSIS — Z515 Encounter for palliative care: Secondary | ICD-10-CM

## 2021-10-08 LAB — BASIC METABOLIC PANEL
Anion gap: 7 (ref 5–15)
BUN: 10 mg/dL (ref 8–23)
CO2: 41 mmol/L — ABNORMAL HIGH (ref 22–32)
Calcium: 8.8 mg/dL — ABNORMAL LOW (ref 8.9–10.3)
Chloride: 87 mmol/L — ABNORMAL LOW (ref 98–111)
Creatinine, Ser: 0.53 mg/dL — ABNORMAL LOW (ref 0.61–1.24)
GFR, Estimated: >60 mL/min (ref >60)
Glucose, Bld: 94 mg/dL (ref 70–99)
Potassium: 3.4 mmol/L — ABNORMAL LOW (ref 3.5–5.1)
Sodium: 135 mmol/L (ref 135–145)

## 2021-10-08 LAB — MAGNESIUM: Magnesium: 2 mg/dL (ref 1.7–2.4)

## 2021-10-08 LAB — BODY FLUID CULTURE W GRAM STAIN: Culture: NO GROWTH

## 2021-10-08 NOTE — Progress Notes (Signed)
PROGRESS NOTE   Subjective/Complaints: Patient with eyes closed, breathing heavily. Wife feels his breathing has improved, patient feels it is about the same.  No new complaints   ROS: Patient denies fever, rash, sore throat, blurred vision, nausea, vomiting, diarrhea, cough, headache, or mood change. +shortness of breath  Objective:   DG CHEST PORT 1 VIEW  Result Date: 10/08/2021 CLINICAL DATA:  87 year old male with history of shortness of breath. EXAM: PORTABLE CHEST 1 VIEW COMPARISON:  Chest x-ray 10/05/2021. FINDINGS: There is a right upper extremity PICC with tip terminating in the superior cavoatrial junction. Skin fold artifact projecting over the upper lateral right hemithorax. Patchy areas of interstitial prominence and airspace consolidation noted throughout the lungs bilaterally (left greater than right). Moderate to large left partially loculated pleural effusion and small to moderate right pleural effusion. Heart size appears mildly enlarged. The patient is rotated to the left on today's exam, resulting in distortion of the mediastinal contours and reduced diagnostic sensitivity and specificity for mediastinal pathology. Atherosclerotic calcifications are noted in the thoracic aorta. IMPRESSION: 1. Overall, the radiographic appearance the chest is very similar to the prior examination, which when correlated with recent chest CT likely reflects chronic postradiation changes throughout the left lung, along with areas of predominantly atelectasis in the lung bases bilaterally and bilateral pleural effusions which are chronic and similar to the prior study, as above. 2. Aortic atherosclerosis. Electronically Signed   By: Vinnie Langton M.D.   On: 10/08/2021 09:58   No results for input(s): WBC, HGB, HCT, PLT in the last 72 hours.   Recent Labs    10/07/21 0540 10/08/21 0332  NA 134* 135  K 3.5 3.4*  CL 86* 87*  CO2 42* 41*   GLUCOSE 99 94  BUN 10 10  CREATININE 0.48* 0.53*  CALCIUM 8.8* 8.8*     Intake/Output Summary (Last 24 hours) at 10/08/2021 1515 Last data filed at 10/08/2021 1446 Gross per 24 hour  Intake 3166 ml  Output 775 ml  Net 2391 ml        Physical Exam: Vital Signs Blood pressure 117/65, pulse 81, temperature 98.2 F (36.8 C), resp. rate 18, height 5\' 10"  (1.778 m), weight 74 kg, SpO2 99 %. Gen: no distress, normal appearing HEENT: oral mucosa pink and moist, NCAT Cardio: Reg rate Chest: normal effort, normal rate of breathing Abd: soft, non-distended Ext: no edema Psych: pleasant, normal affect Musculoskeletal:     low back pain. Pain left scapula--stable   . LE edema improved, trace at left elbow    Fibrous knot in upper,medial calf.  ?scar tissue. Doesn't feel like muscle Neurological:     Comments: Intact to light touch in all 4 extremities B/L  CN's intact on exam. Good insight and awareness. Ox3- appropriate   RUE- 5-/5 in deltoid, biceps, triceps, WE, grip and FA LUE- deltoid 2-/5; weak vs pain; Biceps 4-/5; Triceps 3-/5; WE 4-/5 grip 4-/5; FA 4-/5 RLE- 4+/5 in HF, KE, KF DF and PF LLE- Proximal 3to 3+5; distal 4-/5--stable Skin:    General: Skin is warm and dry.     groin and abdominal wounds are clean/dry  Assessment/Plan: 1. Functional deficits which require 3+ hours per day of interdisciplinary therapy in a comprehensive inpatient rehab setting. Physiatrist is providing close team supervision and 24 hour management of active medical problems listed below. Physiatrist and rehab team continue to assess barriers to discharge/monitor patient progress toward functional and medical goals  Care Tool:  Bathing    Body parts bathed by patient: Right arm, Left arm, Chest, Face, Abdomen, Front perineal area, Right upper leg, Left upper leg, Right lower leg, Left lower leg, Buttocks   Body parts bathed by helper: Abdomen, Front perineal area, Buttocks, Right  upper leg, Left upper leg, Right lower leg, Left lower leg     Bathing assist Assist Level: Minimal Assistance - Patient > 75%     Upper Body Dressing/Undressing Upper body dressing   What is the patient wearing?: Button up shirt    Upper body assist Assist Level: Contact Guard/Touching assist    Lower Body Dressing/Undressing Lower body dressing      What is the patient wearing?: Pants     Lower body assist Assist for lower body dressing: Minimal Assistance - Patient > 75%     Toileting Toileting    Toileting assist Assist for toileting: Minimal Assistance - Patient > 75%     Transfers Chair/bed transfer  Transfers assist  Chair/bed transfer activity did not occur: Refused  Chair/bed transfer assist level: Minimal Assistance - Patient > 75% Chair/bed transfer assistive device: Programmer, multimedia   Ambulation assist   Ambulation activity did not occur: Safety/medical concerns  Assist level: 2 helpers Assistive device: Walker-rolling Max distance: 50'   Walk 10 feet activity   Assist  Walk 10 feet activity did not occur: Safety/medical concerns  Assist level: 2 helpers Assistive device: Walker-rolling   Walk 50 feet activity   Assist Walk 50 feet with 2 turns activity did not occur: Safety/medical concerns  Assist level: 2 helpers Assistive device: Walker-rolling    Walk 150 feet activity   Assist Walk 150 feet activity did not occur: Safety/medical concerns         Walk 10 feet on uneven surface  activity   Assist Walk 10 feet on uneven surfaces activity did not occur: Safety/medical concerns         Wheelchair     Assist Is the patient using a wheelchair?: Yes Type of Wheelchair: Manual    Wheelchair assist level: Supervision/Verbal cueing Max wheelchair distance: 40    Wheelchair 50 feet with 2 turns activity    Assist        Assist Level: Dependent - Patient 0%   Wheelchair 150 feet activity      Assist      Assist Level: Dependent - Patient 0%   Blood pressure 117/65, pulse 81, temperature 98.2 F (36.8 C), resp. rate 18, height 5\' 10"  (1.778 m), weight 74 kg, SpO2 99 %.  Assessment/Plan: Functional deficits secondary  to Debility from MSSA bactermia/PE/endovascular repair of aortic leak; PE and Previous styage IIIB lung CA with new hepatic and bone metastases.  ELOS-  10/11/21 Pt may shower -Continue CIR therapies including PT, OT     2. DVT/R lieofemoral DVT- Lovenox 70 mg BID- until CT in 4 weeks by Vascular with potential switch to oral 3. Pain Management: Tylenola nd Percocet as needed for pain- for LUE/LLE and back pain .   Continue Gabapentin 300 mg QHS-   - scheduled percocet in am and at noon  -continue kpad, flector, muscle  rub  12/30 recommended heat, stretching, massage to left calf knot. Don't think this is a TP. Appears to be fibrous, almost like scar tissue    4. Mood:-  Palliative care following due to his cancer and ego support as required-  ?neuropsychology    5. Skin/Wound Care: encouraging nutrition  -wounds healing 6. . Fluids/Electrolytes/Nutrition: encourage PO -pt is drinking protein supps and eating fairly well -weights stable last week, recheck tomorrow  7. MSSA bacteremia- is on Cefazolin q8 hours for 4 weeks from 09/09/21 -thoracentesis cultures are negative. Fungal cx with gs still pending  8. Persistent Afib- is recent diagnosis- continue Metoprolol just increased to 75 mg BID and on Diltiazem 120 mg daily- monitor for hypotension and hold meds if BP <100.    Vitals:   10/08/21 0832 10/08/21 1446  BP:  117/65  Pulse:  81  Resp:  18  Temp:  98.2 F (36.8 C)  SpO2: 98% 99%    -appreciate cards consult and recs.  -rec transition to eliquis 5mg  bid from lovenox (see #2) -cards has signed off-->outpt f/u with Dr. Sallyanne Kuster 9. S/p aortic leak/endovascular repair- periaortic hematoma- vascular to reorder at CT in 4 weeks to follow up 10.  Previous Stage IIIB Lung CA- with new hepatic and bone mets- likely Stage IV-  -palliative care follow up appreciated, discussed palliative care with wife- she has reached out to patient's PCP to discuss.  -pt is DNR - F/U with Oncology and maybe hospice after d/c.   - continued DOE  -continue oxygen  -xopenex and pulmicort scheduled HHN have helped  -decreased xopenex to bid along with pulmicort  -  scheduled mucinex dm to help him mobilize secretions  -CXR 12/26 demonstrates complete white-out of left lung   -pulmonary performed thoracentesis12/28    -1300cc of straw colored fluid drained  12/30--albumin/lasix, diruesis per PCCM    -recent CT reveals mass, fluid, infiltrate, scarring in lung    -BUN/Cr holding today 11. Constipation-    - moved bowels 12/29 12. PICC line for IV ABX- as of 09/17/21 13. LE Edema--improved with ACE wraps and elevation, nutrition 14. Hypokalemia: supplementing      LOS: 21 days A FACE TO FACE EVALUATION WAS PERFORMED  Nera Haworth P Jakhari Space 10/08/2021, 3:15 PM

## 2021-10-08 NOTE — Consult Note (Addendum)
°Palliative Medicine Inpatient Consult Note ° °Consulting Provider: Ellison, Chi Jane, MD ° °Reason for consult:   °Palliative Care Consult Services Palliative Medicine Consult  °Reason for Consult? end of life care. now stage IV lung cancer with malignant effusions. discussing thoracentesis vs pleurx vs hospice  ° °HPI:  °Per intake H&P --> 88 year old man with extensive recent PMH notable for Stage 3B lung cancer s/p chemoradiation, afib, PE on AC, recent volvulus surgery complicated by aortic leak who is now in rehab.  He has been getting more SOB lately with enlarging left pleural effusion.  Had tap back earlier this month with atypical cells thought to be more reactive. ° °Palliative care has been consulted to discuss end of life, hospice, and potential for pleurex placement given re-accumulation of malignant effusions.  ° °The inpatient Palliative care team saw Leon Lyons on 12//9-12/10 of this year at which time patients goals were to become more functional.  ° °Clinical Assessment/Goals of Care: ° °*Please note that this is a verbal dictation therefore any spelling or grammatical errors are due to the "Dragon [employer] One" system interpretation. ° °I have reviewed medical records including EPIC notes, labs and imaging, received report from bedside RN, assessed the patient who is lying in bed in NAD.  °  °I met with Leon Lyons and his spouse, Leon to further discuss diagnosis prognosis, GOC, EOL wishes, disposition and options. ° °Reviewed patients past medical history of Stage 3B lung cancer s/p chemoradiation, afib, PE on AC, recent volvulus surgery complicated by aortic leak. Discussed that he is no longer a candidate for additional cancer therapies and in all honesty he no longer wishes for these.  °  °I introduced Palliative Medicine as specialized medical care for people living with serious illness. It focuses on providing relief from the symptoms and stress of a serious illness. The goal is to improve quality  of life for both the patient and the family. ° °They moved from [place] to [place] in June 2020.  The patient was raised in [place].  He lost his mom 04/16/2019.  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  When he was in the [employer] he was [place] at [place] near [place].  He enjoys outdoors, yard work, and similar hobbies. He values family, religion, independence. ° °Patient can make decisions for himself, if unable to make decisions he would wish for his spouse, Leon to make decisions for him.   ° °Concepts specific to code status, artifical feeding and hydration, continued IV antibiotics and rehospitalization was had.  Patient is an established DNAR/DNI.  ° °Discussion of pleurex vs ongoing OP thoracentesis was had to manage symptom burden. At this time [name] would like to avoid additional invasive interventions. We reviewed pictures of a pleurex and the way it is inserted. [name] shares if needed he would opt for an outpatient thoracentesis though in all honesty did not feel these interventions have made much of a difference.  ° °The difference between a aggressive medical intervention path and a palliative comfort care path for this patient at this time was had.  A comprehensive review of outpatient palliative care versus hospice was had.  [name] himself appears to be most aligned with the hospice modality of care though he and his wife seem to have some degree of disagreement.  His wife Leon believes he will feel much better once   he is out of the hospital and would like to see how he does at that time.  [name] is in agreement with this.  We reviewed the importance of outpatient palliative follow-up and if [name] is not improving transitioning to hospice to avoid additional hospitalizations. ° °Discussed the importance of continued conversation with family and their  medical providers regarding  overall plan of care and treatment options, ensuring decisions are within the context of the patients values and GOCs. ° °Decision Maker: °Leon Lyons (spouse) [phone] ° °SUMMARY OF RECOMMENDATIONS   °DNAR/DNI ° °Discussion of Pleurex - Ellias if clear that presently he does not wish for this intervention. ° °Discussion of hospice in the setting of no additional tx options for lung CA - Presently patient would appreciate OP Palliative support and when he is ready will transition to hospice care. ° °Plan for discharge on Tuesday.  Plan for outpatient palliative support with transition to hospice if patient continues to decline which is highly anticipated. Will be arranged with palliative services in [place]. ° °At this moment w/o dyspnea though can provide low dose morphine 2.5mg Po Q3H PRN if symptoms develop ° °Ongoing incremental Palliative medicine support ° °Code Status/Advance Care Planning: °DNAR/DNI °  °Palliative Prophylaxis:  °Oral care, mobility ° °Additional Recommendations (Limitations, Scope, Preferences): °Continue current scope of care ° °Psycho-social/Spiritual:  °Desire for further Chaplaincy support: Declines °Additional Recommendations: Education on disease burden associated with lung cancer °  °Prognosis: Very poor patient is hospice appropriate though at this time he and his wife would like to see how he does at home prior to enrolling in hospice.  We will request outpatient palliative support. ° °Discharge Planning: Discharge will be to home per the rehabilitation medicine team on Tuesday ° °Vitals:  ° 10/07/21 2003 10/08/21 0411  °BP:  132/84  °Pulse:  (!) 108  °Resp:  16  °Temp:  97.8 °F (36.6 °C)  °SpO2: 99% 98%  ° ° °Intake/Output Summary (Last 24 hours) at 10/08/2021 0644 °Last data filed at 10/08/2021 0200 °Gross per 24 hour  °Intake 3170 ml  °Output 375 ml  °Net 2795 ml  ° °Last Weight  Most recent update: 09/25/2021  2:23 AM  ° ° Weight  °74 kg (163 lb 2.3 oz)  °       ° °  ° °Gen: Elderly Caucasian male in no acute distress °HEENT: moist mucous membranes °CV: Regular rate and rhythm °PULM: On room air °ABD: soft/nontender °EXT: No edema °Neuro: Alert and oriented x3 ° °PPS: 50% ° ° °This conversation/these recommendations were discussed with patient primary care team, Dr. Swartz ° °Time In: 1030 °Time Out: 1150 °Total Time: 80 °Greater than 50%  of this time was spent counseling and coordinating care related to the above assessment and plan. ° °Michelle Ferolito °[facility] Team °Team Cell Phone: [phone] °Please utilize secure chat with additional questions, if there is no response within 30 minutes please call the above phone number ° °Palliative Medicine Team providers are available by phone from 7am to 7pm daily and can be reached through the team cell phone.  °Should this patient require assistance outside of these hours, please call the patient's attending physician. ° ° °

## 2021-10-08 NOTE — Progress Notes (Addendum)
NAME:  Leon Lyons, MRN:  [id number], DOB:  09-Aug-1933, LOS: 21 ADMISSION DATE:  09/17/2021, CONSULTATION DATE:  10/04/21 REFERRING MD:  Naaman Plummer, CHIEF COMPLAINT:  SOB   History of Present Illness:  87 year old man with PMH notable for Stage 3B lung cancer s/p chemoradiation, afib, PE on AC, recent volvulus surgery complicated by aortic leak who is now in rehab.  He has been getting more SOB lately with enlarging left pleural effusion.  Had tap back earlier this month with atypical cells thought to be more reactive.  PCCM consulted to assist with management.  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  Radiation Therapy L Lung 5-03/2021 MSSA Bacteremia 09/2021  Tobacco Abuse   Significant Hospital Events: Including procedures, antibiotic start and stop dates in addition to other pertinent events   12/10 Admit  12/27 PCCM consulted 12/28 Left thoracentesis 1300cc straw colored fluid removed  Interim History / Subjective:  Pt denies acute complaints, feels the pulmicort is not helping him and questions if he needs to keep using them  Wife at bedside, states they do not want to proceed with pleurx catheter placement   Objective   Blood pressure (!) 155/96, pulse (!) 106, temperature 97.8 F (36.6 C), temperature source Oral, resp. rate 16, height 5\' 10"  (1.778 m), weight 74 kg, SpO2 98 %.        Intake/Output Summary (Last 24 hours) at 10/08/2021 0831 Last data filed at 10/08/2021 0200 Gross per 24 hour  Intake 3050 ml  Output 375 ml  Net 2675 ml   Filed Weights   09/17/21 1903 09/23/21 0609 09/24/21 1953  Weight: 79.5 kg 72.4 kg 74 kg    Physical Exam: General: elderly adult male lying in bed in NAD, wife at bedside  HEENT: MM pink/moist, fair dentition, anicteric  Neuro: AAOx4, speech clear, MAE / generalized weakness  CV: s1s2 RRR, no m/r/g PULM: non-labored at rest, speaking full sentences, lungs with  rhonchi, soft wheezing bilaterally  GI: soft, bsx4 active  Extremities: warm/dry  Skin: no rashes or lesions   Resolved Hospital Problem list      Assessment & Plan:   Recurrent malignant bilateral pleural effusions  Stage IIIB NSCL cancer s/p chemoradiation Remote Tobacco Abuse  Recent MSSA bacteremia Previously on consolidation IT/ XRT to left lung, interrupted with hospitalizations since October 2022. Progression to stage IV with malignant bilateral effusions  CT reviewed with bilateral pleural effusions. Cytology from 10/05/21 thoracentesis showed malignant cells. Discussed repeat thoracentesis if fluid reaccumulates. Depending on rate of fluid accumulation, we could consider PleurX. His wife will watch videos and decide if this is feasible for her to help with at home. For now, patient wishes to be conservative with thoracentesis as needed and even expressed that hospice would be reasonable as he wants to limit his suffering. If PleurX is warranted, he will re-discuss option at that time.  CXR review from 12/31, by my read, shows similar findings to 12/28 effusions with slight progression of effusion.   -continue pulmicort nebs as long as patient wants to continue -wean O2 for sats >88% -continue diuresis as renal function / BP permit  -pulmonary hygiene - IS, mobilize  -follow up CXR PRN -pt does not want pleurx catheter placement.  Reviewed with him and wife that they can do thoracentesis PRN if he is symptomatic as long as he is still able to come into clinic/hospital for procedure.  Also discussed that at some point, he will be too debilitated to transport and at that time, he would use medications like ativan / morphine for dyspnea burden.    PCCM will be available PRN. Please call back if new needs arise.    Noe Gens, MSN, APRN, NP-C, AGACNP-BC [facility] 10/08/2021, 8:39 AM   Please see [email or web address] for pager details.   From 7A-7P if no  response, please call [phone] After hours, please call ELink [phone]

## 2021-10-09 DIAGNOSIS — Z9889 Other specified postprocedural states: Secondary | ICD-10-CM

## 2021-10-09 DIAGNOSIS — R5381 Other malaise: Secondary | ICD-10-CM | POA: Diagnosis not present

## 2021-10-09 LAB — BASIC METABOLIC PANEL
Anion gap: 5 (ref 5–15)
BUN: 11 mg/dL (ref 8–23)
CO2: 39 mmol/L — ABNORMAL HIGH (ref 22–32)
Calcium: 8.5 mg/dL — ABNORMAL LOW (ref 8.9–10.3)
Chloride: 90 mmol/L — ABNORMAL LOW (ref 98–111)
Creatinine, Ser: 0.53 mg/dL — ABNORMAL LOW (ref 0.61–1.24)
GFR, Estimated: >60 mL/min (ref >60)
Glucose, Bld: 95 mg/dL (ref 70–99)
Potassium: 3.3 mmol/L — ABNORMAL LOW (ref 3.5–5.1)
Sodium: 134 mmol/L — ABNORMAL LOW (ref 135–145)

## 2021-10-09 LAB — MAGNESIUM: Magnesium: 2 mg/dL (ref 1.7–2.4)

## 2021-10-09 NOTE — Progress Notes (Addendum)
Physical Therapy Session Note  Patient Details  Name: Leon Lyons MRN: [id number] Date of Birth: [birth year]  Today's Date: 10/09/2021 PT Individual Time: 1305-1405 PT Individual Time Calculation (min): 60 min   Short Term Goals: Week 2:  PT Short Term Goal 1 (Week 2): Pt will perform bed mobility consistently with minA. PT Short Term Goal 1 - Progress (Week 2): Met PT Short Term Goal 2 (Week 2): Pt will perform sit to stand consistently with minA. PT Short Term Goal 2 - Progress (Week 2): Met PT Short Term Goal 3 (Week 2): Pt will perform bed to chair transfer consistently with minA. PT Short Term Goal 3 - Progress (Week 2): Met PT Short Term Goal 4 (Week 2): Pt will ambulate x150' with minA and LRAD. PT Short Term Goal 4 - Progress (Week 2): Met PT Short Term Goal 5 (Week 2): STGs = LTGs    Skilled Therapeutic Interventions/Progress Updates:  Pt resting in bed, with wife in attendance. He rated pain 8/10 in L shoulder and low back, premedicated.  Rest breaks to address pain during session, and ice pack to L shoulder at end of session.  Pt and wife educated on 20 min on/off protocol for ice; they voiced understanding.   Pt on 2L O2 via [place]; HR 91 and O2 sats 96% at rest.  O2 decreased to 1L.  Therapeutic exercises performed with LEs to increase strength for functional mobility. Supine 1 x 15 each: bil hip adductor squeezes with hips and knees flexed; bil hip adductor with bil bridging; bil hip internal rotation with isometric hold at end range, extended knees; R ankle pumps with resistance for ankle PF, L ankle pumps without resistance due to old injury.  Pt reported LBP after PT stretched R heel cord.  PT positioned bed in back -relaxation position with hips and knees flexed and supported by bed, at approx 70 degrees flexion, HOB not raised.  This position relieved pain in a couple of minutes.   Bed mobility for rolling R and sitting up, mod assist for elevating trunk and partially  for bil LEs.  Pt SOB, O2 sats 88%.  O2 increased to 2 L and O2 increased to 92% in 1 minutes.  Pt actively pressed heels into floor to improve abiltty to bring feet under him before STS.  Sit> stand from raised bed to RW, min assist with max cues and demo for trunk flexion to bring COG over feet.  Pt stood x 1 minute , wt shifting L><R, RW, until he fatigued and had to sit.  Sit> supine with mod assist.   Wife observed techniques for supine>< sit.   At end of session, pt resting in bed with needs at hand, alarm set, and wife  present.  Bed positioned with HOB raised to approx 20 degrees,and knee area slightly raised. 02 set at 1.5 L/min      Therapy Documentation Precautions:  Precautions Precautions: Fall Precaution Comments: monitor BP, HR, and SpO2; possible L shoulder RC injury ? Restrictions Weight Bearing Restrictions: No         Therapy/Group: Individual Therapy  Gorman Safi 10/09/2021, 5:38 PM

## 2021-10-09 NOTE — Progress Notes (Signed)
Palliative Medicine Inpatient Follow Up Note  Consulting Provider: Luciano Cutter, MD   Reason for consult:   Palliative Care Consult Services Palliative Medicine Consult  Reason for Consult? end of life care. now stage IV lung cancer with malignant effusions. discussing thoracentesis vs pleurx vs hospice    HPI:  Per intake H&P --> 88 year old man with extensive recent PMH notable for Stage 3B lung cancer s/p chemoradiation, afib, PE on AC, recent volvulus surgery complicated by aortic leak who is now in rehab.  He has been getting more SOB lately with enlarging left pleural effusion.  Had tap back earlier this month with atypical cells thought to be more reactive.   Palliative care has been consulted to discuss end of life, hospice, and potential for pleurex placement given re-accumulation of malignant effusions.    The inpatient Palliative care team saw Leon Lyons on 12//9-12/10 of this year at which time patients goals were to become more functional.   Today's Discussion (10/09/2021):  *Please note that this is a verbal dictation therefore any spelling or grammatical errors are due to the "[employer]agon [employer] One" system interpretation.  Chart reviewed inclusive of progress notes, laboratory results, and diagnostic images.   I met with Leon Lyons and his spouse, April at bedside. We discussed that he is having some pain in his lower back at this time. This is consistently present and day to day is about a 5/10 pain. He is also enduring ongoing feelings of weakness, fatigue, frustration with polypharmacy and no desire to eat.   We reviewed that he now appears to be at a point where hospice is a very reasonable next step and may alleviate many of his concerns. He shares a lack of willingness to stop all of his medications. We reviewed that he would not need to stop all medications just those which are non-essential. He and his wife are interested to see how he feels when he goes home first on  Palliative care and if again, he is not progressing to consider hospice from there. I shared that having the hospice agency speak to them may not be a bad idea as they may have more to offer than Karanvir Is aware of. He and his wife are willing to have a meeting.  Questions and concerns addressed   I was able to contact [facility] this afternoon to set up a meeting with the [facility] family.   Objective Assessment: Vital Signs Vitals:   10/09/21 0842 10/09/21 0843  BP:    Pulse:    Resp:    Temp:    SpO2: 98% 98%    Intake/Output Summary (Last 24 hours) at 10/09/2021 1343 Last data filed at 10/09/2021 0900 Gross per 24 hour  Intake 726 ml  Output 1675 ml  Net -949 ml   Last Weight  Most recent update: 09/25/2021  2:23 AM    Weight  74 kg (163 lb 2.3 oz)            Gen: Elderly Caucasian male in no acute distress HEENT: moist mucous membranes CV: Regular rate and rhythm PULM: On room air ABD: soft/nontender EXT: No edema Neuro: Alert and oriented x3  SUMMARY OF RECOMMENDATIONS   DNAR/DNI   Discussion of Pleurex - Adonias is clear that presently he does not wish for this intervention.   Discussion of hospice in the setting of no additional tx options for lung CA - Presently patient would appreciate OP Palliative support and when  he is ready will transition to hospice care.   Have called [facility] for them to provide information to patient and his family on Palliative Care versus Hospice. I have shared very frankly that I anticipate he will need hospice sooner than later.   At this moment w/o dyspnea though can provide low dose morphine 2.$RemoveBeforeD'5mg'[id number]$  PO Q3H PRN if symptoms develop   Ongoing incremental Palliative medicine support __________________________________ MDM - Moderate in the setting of ongoing debility from prolonged hospital stay and lung CA with  mets. ______________________________________________________________________________________ [place] Team Team Cell Phone: [phone] Please utilize secure chat with additional questions, if there is no response within 30 minutes please call the above phone number  Palliative Medicine Team providers are available by phone from 7am to 7pm daily and can be reached through the team cell phone.  Should this patient require assistance outside of these hours, please call the patient's attending physician.

## 2021-10-09 NOTE — Progress Notes (Signed)
Physical Therapy Session Note  Patient Details  Name: Leon Lyons MRN: [id number] Date of Birth: 02/15/1933  Today's Date: 10/09/2021 PT Individual Time: 1000-1030 PT Individual Time Calculation (min): 30 min   Short Term Goals: PT Short Term Goal 5 (Week 2): STGs = LTGs  Skilled Therapeutic Interventions/Progress Updates:    Pt received supine in bed, agreeable to PT session. Pt reports pain "all over my body" but mostly in his back and his L shoulder at rest that increases with physical activity. Pt is not due for pain medication until after session, utilized repositioning and distraction techniques during session for pain management. [name] April present during session and very hands-on in assisting pt with transfers, bed mobility, etc. Discussed upcoming d/c this week and April feels prepared to be caregiver for her husband from a PT perspective, just wants to continue working on increasing patient endurance and gait distance. Pt is min to mod A for bed mobility for trunk elevation and LE management. Sit to stand initially with mod A to RW progressing to min A with cues for anterior weigth shift and "nose over toes". Ambulation x 25 ft, 2 x 30 ft, x 85 ft with RW and CGA for balance. Pt fatigues very quickly during gait this date and remains limited by ongoing whole body pain as well as impaired endurance. Pt on 2L O2 throughout session via [place], SpO2 remains at 91% (+) with physical activity and returns to 94% (+) with seated rest breaks and pursed lip breathing techniques. Pt declines any further activity following ambulation due to fatigue and pain, requests to return to bed. Sit to supine mod A needed for BLE management. Assist x 2 to scoot to [facility]. Pt left semi-reclined in bed with needs in reach, bed alarm in place, wife present. Pt missed 30 min of scheduled therapy session due to pain and fatigue.  Therapy Documentation Precautions:  Precautions Precautions: Fall Precaution  Comments: monitor BP, HR, and SpO2; possible L shoulder RC injury ? Restrictions Weight Bearing Restrictions: No General: PT Amount of Missed Time (min): 30 Minutes PT Missed Treatment Reason: Pain;Patient fatigue      Therapy/Group: Individual Therapy   Excell Seltzer, PT, DPT, CSRS  10/09/2021, 10:46 AM

## 2021-10-09 NOTE — Progress Notes (Signed)
Occupational Therapy Session Note  Patient Details  Name: Leon Lyons MRN: [id number] Date of Birth: 22-Feb-1933  Today's Date: 10/10/2021 OT Individual Time:  - 45 minutes missed    Skilled Therapeutic Interventions/Progress Updates:    Pt politely declined OT due to 7/10 pain that has been persisting throughout the day. Offered aromatherapy and diaphragmatic breathing interventions to address holistically with pt declining. 45 minutes missed.   Therapy Documentation Precautions:  Precautions Precautions: Fall Precaution Comments: monitor BP, HR, and SpO2; possible L shoulder RC injury ? Restrictions Weight Bearing Restrictions: No General: General OT Amount of Missed Time: 67 Minutes PT Missed Treatment Reason: Pain;Patient fatigue Vital Signs: Therapy Vitals Temp: (!) 97.5 F (36.4 C) Pulse Rate: 82 Resp: 16 BP: 130/71 Patient Position (if appropriate): Lying Oxygen Therapy SpO2: 99 % O2 Device: Room Air O2 Flow Rate (L/min): 2 L/min ADL: ADL Eating: Independent Grooming: Independent Where Assessed-Grooming: Sitting at sink Upper Body Bathing: Setup Where Assessed-Upper Body Bathing: Edge of bed Lower Body Bathing: Minimal assistance Where Assessed-Lower Body Bathing: Wheelchair Upper Body Dressing: Supervision/safety Where Assessed-Upper Body Dressing: Wheelchair Lower Body Dressing: Minimal assistance Toileting: Minimal assistance Toilet Transfer: Minimal assistance Toilet Transfer Method: Stand pivot Tub/Shower Transfer: Unable to assess   Therapy/Group: Individual Therapy  Rosmery Duggin A Mamadou Breon 10/10/2021, 3:56 PM

## 2021-10-09 NOTE — Progress Notes (Signed)
PROGRESS NOTE   Subjective/Complaints: Patient is more alert Breathing has improved Discussed his back pain, considered voltaren gel but he has a reported allergy. Lidocaine patches do not help   ROS: Patient denies fever, rash, sore throat, blurred vision, nausea, vomiting, diarrhea, cough, headache, or mood change. +shortness of breat, +chronic back pain  Objective:   DG CHEST PORT 1 VIEW  Result Date: 10/08/2021 CLINICAL DATA:  88 year old male with history of shortness of breath. EXAM: PORTABLE CHEST 1 VIEW COMPARISON:  Chest x-ray 10/05/2021. FINDINGS: There is a right upper extremity PICC with tip terminating in the superior cavoatrial junction. Skin fold artifact projecting over the upper lateral right hemithorax. Patchy areas of interstitial prominence and airspace consolidation noted throughout the lungs bilaterally (left greater than right). Moderate to large left partially loculated pleural effusion and small to moderate right pleural effusion. Heart size appears mildly enlarged. The patient is rotated to the left on today's exam, resulting in distortion of the mediastinal contours and reduced diagnostic sensitivity and specificity for mediastinal pathology. Atherosclerotic calcifications are noted in the thoracic aorta. IMPRESSION: 1. Overall, the radiographic appearance the chest is very similar to the prior examination, which when correlated with recent chest CT likely reflects chronic postradiation changes throughout the left lung, along with areas of predominantly atelectasis in the lung bases bilaterally and bilateral pleural effusions which are chronic and similar to the prior study, as above. 2. Aortic atherosclerosis. Electronically Signed   By: Vinnie Langton M.D.   On: 10/08/2021 09:58   No results for input(s): WBC, HGB, HCT, PLT in the last 72 hours.   Recent Labs    10/08/21 0332 10/09/21 0403  NA 135 134*  K  3.4* 3.3*  CL 87* 90*  CO2 41* 39*  GLUCOSE 94 95  BUN 10 11  CREATININE 0.53* 0.53*  CALCIUM 8.8* 8.5*     Intake/Output Summary (Last 24 hours) at 10/09/2021 1041 Last data filed at 10/09/2021 0900 Gross per 24 hour  Intake 726 ml  Output 1675 ml  Net -949 ml        Physical Exam: Vital Signs Blood pressure (!) 121/57, pulse 95, temperature 97.8 F (36.6 C), temperature source Oral, resp. rate 16, height 5\' 10"  (1.778 m), weight 74 kg, SpO2 98 %. Gen: no distress, normal appearing HEENT: oral mucosa pink and moist, NCAT Cardio: Reg rate Chest: normal effort, normal rate of breathing Abd: soft, non-distended Ext: no edema Psych: pleasant, normal affect Skin: intact Musculoskeletal:     low back pain. Pain left scapula--stable   . LE edema improved, trace at left elbow    Fibrous knot in upper,medial calf.  ?scar tissue. Doesn't feel like muscle Neurological:     Comments: Intact to light touch in all 4 extremities B/L  CN's intact on exam. Good insight and awareness. Ox3- appropriate   RUE- 5-/5 in deltoid, biceps, triceps, WE, grip and FA LUE- deltoid 2-/5; weak vs pain; Biceps 4-/5; Triceps 3-/5; WE 4-/5 grip 4-/5; FA 4-/5 RLE- 4+/5 in HF, KE, KF DF and PF LLE- Proximal 3to 3+5; distal 4-/5--stable Skin:    General: Skin is warm and dry.  groin and abdominal wounds are clean/dry       Assessment/Plan: 1. Functional deficits which require 3+ hours per day of interdisciplinary therapy in a comprehensive inpatient rehab setting. Physiatrist is providing close team supervision and 24 hour management of active medical problems listed below. Physiatrist and rehab team continue to assess barriers to discharge/monitor patient progress toward functional and medical goals  Care Tool:  Bathing    Body parts bathed by patient: Right arm, Left arm, Chest, Face, Abdomen, Front perineal area, Right upper leg, Left upper leg, Right lower leg, Left lower leg, Buttocks    Body parts bathed by helper: Abdomen, Front perineal area, Buttocks, Right upper leg, Left upper leg, Right lower leg, Left lower leg     Bathing assist Assist Level: Minimal Assistance - Patient > 75%     Upper Body Dressing/Undressing Upper body dressing   What is the patient wearing?: Button up shirt    Upper body assist Assist Level: Contact Guard/Touching assist    Lower Body Dressing/Undressing Lower body dressing      What is the patient wearing?: Pants     Lower body assist Assist for lower body dressing: Minimal Assistance - Patient > 75%     Toileting Toileting    Toileting assist Assist for toileting: Minimal Assistance - Patient > 75%     Transfers Chair/bed transfer  Transfers assist  Chair/bed transfer activity did not occur: Refused  Chair/bed transfer assist level: Minimal Assistance - Patient > 75% Chair/bed transfer assistive device: Programmer, multimedia   Ambulation assist   Ambulation activity did not occur: Safety/medical concerns  Assist level: 2 helpers Assistive device: Walker-rolling Max distance: 50'   Walk 10 feet activity   Assist  Walk 10 feet activity did not occur: Safety/medical concerns  Assist level: 2 helpers Assistive device: Walker-rolling   Walk 50 feet activity   Assist Walk 50 feet with 2 turns activity did not occur: Safety/medical concerns  Assist level: 2 helpers Assistive device: Walker-rolling    Walk 150 feet activity   Assist Walk 150 feet activity did not occur: Safety/medical concerns         Walk 10 feet on uneven surface  activity   Assist Walk 10 feet on uneven surfaces activity did not occur: Safety/medical concerns         Wheelchair     Assist Is the patient using a wheelchair?: Yes Type of Wheelchair: Manual    Wheelchair assist level: Supervision/Verbal cueing Max wheelchair distance: 40    Wheelchair 50 feet with 2 turns activity    Assist         Assist Level: Dependent - Patient 0%   Wheelchair 150 feet activity     Assist      Assist Level: Dependent - Patient 0%   Blood pressure (!) 121/57, pulse 95, temperature 97.8 F (36.6 C), temperature source Oral, resp. rate 16, height 5\' 10"  (1.778 m), weight 74 kg, SpO2 98 %.  Assessment/Plan: Functional deficits secondary  to Debility from MSSA bactermia/PE/endovascular repair of aortic leak; PE and Previous styage IIIB lung CA with new hepatic and bone metastases.  ELOS-  10/11/21 Pt may shower -Continue CIR therapies including PT, OT    Discussed with palliative care plan for outpatient palliative care follow-up.   2. DVT/R lieofemoral DVT- Lovenox 70 mg BID- until CT in 4 weeks by Vascular with potential switch to oral 3. Pain Management: Tylenola nd Percocet as needed for pain- for  LUE/LLE and back pain .   Continue Gabapentin 300 mg QHS-   - scheduled percocet in am and at noon  -continue kpad, flector, muscle rub  12/30 recommended heat, stretching, massage to left calf knot. Don't think this is a TP. Appears to be fibrous, almost like scar tissue   Discussed voltaren gel, but he has allergy  Tried lidocaine patches but not helpful 4. Mood:-  Palliative care following due to his cancer and ego support as required-  ?neuropsychology    5. Skin/Wound Care: encouraging nutrition  -wounds healing 6. . Fluids/Electrolytes/Nutrition: encourage PO -pt is drinking protein supps and eating fairly well -weights stable last week, recheck tomorrow  7. MSSA bacteremia- is on Cefazolin q8 hours for 4 weeks from 09/09/21 -thoracentesis cultures are negative. Fungal cx with gs still pending  8. Persistent Afib- is recent diagnosis- continue Metoprolol just increased to 75 mg BID and on Diltiazem 120 mg daily- monitor for hypotension and hold meds if BP <100.    Vitals:   10/09/21 0842 10/09/21 0843  BP:    Pulse:    Resp:    Temp:    SpO2: 98% 98%    -appreciate cards  consult and recs.  -rec transition to eliquis 5mg  bid from lovenox (see #2) -cards has signed off-->outpt f/u with Dr. Sallyanne Kuster 9. S/p aortic leak/endovascular repair- periaortic hematoma- vascular to reorder at CT in 4 weeks to follow up 10. Previous Stage IIIB Lung CA- with new hepatic and bone mets- likely Stage IV-  -palliative care follow up appreciated, discussed palliative care with wife- she has reached out to patient's PCP to discuss.  -pt is DNR - F/U with Oncology and maybe hospice after d/c.   - continued DOE  -continue oxygen  -xopenex and pulmicort scheduled HHN have helped  -decreased xopenex to bid along with pulmicort  -  scheduled mucinex dm to help him mobilize secretions  -CXR 12/26 demonstrates complete white-out of left lung   -pulmonary performed thoracentesis12/28    -1300cc of straw colored fluid drained  12/30--albumin/lasix, diruesis per PCCM    -recent CT reveals mass, fluid, infiltrate, scarring in lung    -BUN/Cr holding today   Discussed outpatient thoracentesis prn 11. Constipation-    - moved bowels 12/29 12. PICC line for IV ABX- as of 09/17/21 13. LE Edema--improved with ACE wraps and elevation, nutrition 14. Hypokalemia: will not add additional supplementation right now given his low BP, continue to monitor tomorrow.       LOS: 22 days A FACE TO FACE EVALUATION WAS PERFORMED  Clide Deutscher Kawan [place] 10/09/2021, 10:41 AM

## 2021-10-10 ENCOUNTER — Inpatient Hospital Stay (HOSPITAL_COMMUNITY): Payer: Medicare Other

## 2021-10-10 DIAGNOSIS — R5381 Other malaise: Secondary | ICD-10-CM | POA: Diagnosis not present

## 2021-10-10 LAB — BASIC METABOLIC PANEL
Anion gap: 7 (ref 5–15)
BUN: 13 mg/dL (ref 8–23)
CO2: 39 mmol/L — ABNORMAL HIGH (ref 22–32)
Calcium: 8.7 mg/dL — ABNORMAL LOW (ref 8.9–10.3)
Chloride: 89 mmol/L — ABNORMAL LOW (ref 98–111)
Creatinine, Ser: 0.47 mg/dL — ABNORMAL LOW (ref 0.61–1.24)
GFR, Estimated: >60 mL/min (ref >60)
Glucose, Bld: 94 mg/dL (ref 70–99)
Potassium: 3.2 mmol/L — ABNORMAL LOW (ref 3.5–5.1)
Sodium: 135 mmol/L (ref 135–145)

## 2021-10-10 LAB — MAGNESIUM: Magnesium: 1.9 mg/dL (ref 1.7–2.4)

## 2021-10-10 MED ORDER — POTASSIUM CHLORIDE CRYS ER 20 MEQ PO TBCR: 20.0000 meq | EXTENDED_RELEASE_TABLET | Freq: Every day | ORAL | Status: DC

## 2021-10-10 MED ORDER — POTASSIUM CHLORIDE CRYS ER 10 MEQ PO TBCR: 10.0000 meq | EXTENDED_RELEASE_TABLET | Freq: Two times a day (BID) | ORAL | Status: DC

## 2021-10-10 MED ORDER — OXYCODONE-ACETAMINOPHEN 7.5-325 MG PO TABS
2.0000 | ORAL_TABLET | Freq: Four times a day (QID) | ORAL | Status: DC | PRN
  Administered 2021-10-10 – 2021-10-11 (×2): 2 via ORAL
  Filled 2021-10-10 (×2): qty 2

## 2021-10-10 MED ORDER — POTASSIUM CHLORIDE CRYS ER 20 MEQ PO TBCR
30.0000 meq | EXTENDED_RELEASE_TABLET | Freq: Every day | ORAL | Status: AC
  Administered 2021-10-10 – 2021-10-11 (×2): 30 meq via ORAL
  Filled 2021-10-10 (×2): qty 1

## 2021-10-10 NOTE — Progress Notes (Signed)
PROGRESS NOTE   Subjective/Complaints:    ROS: Patient denies CP, SOB, N/V/D   Objective:   No results found. No results for input(s): WBC, HGB, HCT, PLT in the last 72 hours.   Recent Labs    10/09/21 0403 10/10/21 0332  NA 134* 135  K 3.3* 3.2*  CL 90* 89*  CO2 39* 39*  GLUCOSE 95 94  BUN 11 13  CREATININE 0.53* 0.47*  CALCIUM 8.5* 8.7*      Intake/Output Summary (Last 24 hours) at 10/10/2021 1244 Last data filed at 10/10/2021 0552 Gross per 24 hour  Intake 610 ml  Output 1675 ml  Net -1065 ml         Physical Exam: Vital Signs Blood pressure 115/81, pulse 97, temperature 97.8 F (36.6 C), resp. rate 20, height 5\' 10"  (1.778 m), weight 74 kg, SpO2 96 %.  General: No acute distress Mood and affect are appropriate Heart: Regular rate and rhythm no rubs murmurs or extra sounds Lungs: Clear to auscultation, breathing unlabored, no rales or wheezes Abdomen: Positive bowel sounds, soft nontender to palpation, nondistended Extremities: No clubbing, cyanosis, or edema Skin: No evidence of breakdown, no evidence of rash   Skin: intact Musculoskeletal:     low back pain. Pain left scapula--stable   . LE edema improved, trace at left elbow    Fibrous knot in upper,medial calf.  ?scar tissue. Doesn't feel like muscle Neurological:     Comments: Intact to light touch in all 4 extremities B/L  CN's intact on exam. Good insight and awareness. Ox3- appropriate   RUE- 5-/5 in deltoid, biceps, triceps, WE, grip and FA LUE- deltoid 2-/5; weak vs pain; Biceps 4-/5; Triceps 3-/5; WE 4-/5 grip 4-/5; FA 4-/5 RLE- 4+/5 in HF, KE, KF DF and PF LLE- Proximal 3to 3+5; distal 4-/5--stable Skin:    General: Skin is warm and dry.     groin and abdominal wounds are clean/dry       Assessment/Plan: 1. Functional deficits which require 3+ hours per day of interdisciplinary therapy in a comprehensive inpatient rehab  setting. Physiatrist is providing close team supervision and 24 hour management of active medical problems listed below. Physiatrist and rehab team continue to assess barriers to discharge/monitor patient progress toward functional and medical goals  Care Tool:  Bathing    Body parts bathed by patient: Right arm, Left arm, Chest, Face, Abdomen, Front perineal area, Right upper leg, Left upper leg, Right lower leg, Left lower leg, Buttocks   Body parts bathed by helper: Abdomen, Front perineal area, Buttocks, Right upper leg, Left upper leg, Right lower leg, Left lower leg     Bathing assist Assist Level: Minimal Assistance - Patient > 75%     Upper Body Dressing/Undressing Upper body dressing   What is the patient wearing?: Button up shirt    Upper body assist Assist Level: Contact Guard/Touching assist    Lower Body Dressing/Undressing Lower body dressing      What is the patient wearing?: Pants     Lower body assist Assist for lower body dressing: Minimal Assistance - Patient > 75%     Chartered loss adjuster  assist Assist for toileting: Minimal Assistance - Patient > 75%     Transfers Chair/bed transfer  Transfers assist  Chair/bed transfer activity did not occur: Refused  Chair/bed transfer assist level: Minimal Assistance - Patient > 75% Chair/bed transfer assistive device: Programmer, multimedia   Ambulation assist   Ambulation activity did not occur: Safety/medical concerns  Assist level: 2 helpers Assistive device: Walker-rolling Max distance: 50'   Walk 10 feet activity   Assist  Walk 10 feet activity did not occur: Safety/medical concerns  Assist level: 2 helpers Assistive device: Walker-rolling   Walk 50 feet activity   Assist Walk 50 feet with 2 turns activity did not occur: Safety/medical concerns  Assist level: 2 helpers Assistive device: Walker-rolling    Walk 150 feet activity   Assist Walk 150 feet  activity did not occur: Safety/medical concerns         Walk 10 feet on uneven surface  activity   Assist Walk 10 feet on uneven surfaces activity did not occur: Safety/medical concerns         Wheelchair     Assist Is the patient using a wheelchair?: Yes Type of Wheelchair: Manual    Wheelchair assist level: Supervision/Verbal cueing Max wheelchair distance: 40    Wheelchair 50 feet with 2 turns activity    Assist        Assist Level: Dependent - Patient 0%   Wheelchair 150 feet activity     Assist      Assist Level: Dependent - Patient 0%   Blood pressure 115/81, pulse 97, temperature 97.8 F (36.6 C), resp. rate 20, height 5\' 10"  (1.778 m), weight 74 kg, SpO2 96 %.  Assessment/Plan: Functional deficits secondary  to Debility from MSSA bactermia/PE/endovascular repair of aortic leak; PE and Previous styage IIIB lung CA with new hepatic and bone metastases.  ELOS-  10/11/21 Pt may shower -Continue CIR therapies including PT, OT    Discussed with palliative care plan for outpatient palliative care follow-up. Home with palliative care, transition to hospice if no improvement   2. DVT/R lieofemoral DVT- Lovenox 70 mg BID- until CT in 4 weeks by Vascular with potential switch to oral 3. Pain Management: Tylenola nd Percocet as needed for pain- for LUE/LLE and back pain .   Continue Gabapentin 300 mg QHS-   - change percocet to 7.5mg  2 tab q 6 prn Check xray of sacrum , check for metastatic disease  4. Mood:-  Palliative care following due to his cancer and ego support as required-  ?neuropsychology    5. Skin/Wound Care: encouraging nutrition  -wounds healing 6. . Fluids/Electrolytes/Nutrition: encourage PO Hypo K+ will supplement prior to d/c 7. MSSA bacteremia- is on Cefazolin q8 hours for 4 weeks from 09/09/21 -thoracentesis cultures are negative. Fungal cx with gs still pending  8. Persistent Afib- is recent diagnosis- continue Metoprolol just  increased to 75 mg BID and on Diltiazem 120 mg daily- monitor for hypotension and hold meds if BP <100.    Vitals:   10/10/21 0530 10/10/21 0754  BP: 115/81   Pulse: 97   Resp: 20   Temp: 97.8 F (36.6 C)   SpO2: 100% 96%     -appreciate cards consult and recs.  -rec transition to eliquis 5mg  bid from lovenox (see #2) -cards has signed off-->outpt f/u with Dr. Sallyanne Kuster 9. S/p aortic leak/endovascular repair- periaortic hematoma- vascular to reorder at CT in 4 weeks to follow up 10. Previous Stage IIIB Lung  CA- with new hepatic and bone mets- likely Stage IV-  -palliative care follow up appreciated, discussed palliative care with wife- she has reached out to patient's PCP to discuss.  -pt is DNR - F/U with Oncology and maybe hospice after d/c.   - continued DOE  -continue oxygen  -xopenex and pulmicort scheduled HHN have helped  -decreased xopenex to bid along with pulmicort  -  scheduled mucinex dm to help him mobilize secretions  -CXR 12/26 demonstrates complete white-out of left lung   -pulmonary performed thoracentesis12/28    -1300cc of straw colored fluid drained  12/30--albumin/lasix, diruesis per PCCM    -recent CT reveals mass, fluid, infiltrate, scarring in lung    -BUN/Cr holding today   Discussed outpatient thoracentesis prn 11. Constipation-    - moved bowels 12/29 12. PICC line for IV ABX- as of 09/17/21 13. LE Edema--improved with ACE wraps and elevation, nutrition 14. Hypokalemia: will not add additional supplementation right now given his low BP, continue to monitor tomorrow.       LOS: 23 days A FACE TO FACE EVALUATION WAS PERFORMED  Charlett Blake 10/10/2021, 12:44 PM

## 2021-10-10 NOTE — Progress Notes (Signed)
Patient ID: Leon Lyons, male   DOB: [birth year], 88 y.o.   MRN: [id number]  SW received phone call from pt wife April reporting that she did not receive a phone call to inform her when home o2 would be delivered, and when she got home there was a sign that indicated that they missed the delivery. SW called on-call line to inform on the delay in oxygen delivery and transport chair. Reports she will notify the logistics team about reaching out to his wife to notify on above as unsure if oxygen would be addresses today.  SW informed pt wife on above. SW informed will address issue tomorrow if not resolved today.    Loralee Pacas, MSW, [place] Office: [phone] Cell: [phone] Fax: [phone]

## 2021-10-10 NOTE — Plan of Care (Signed)
Problem: RH Balance Goal: LTG: Patient will maintain dynamic sitting balance (OT) Description: LTG:  Patient will maintain dynamic sitting balance with assistance during activities of daily living (OT) Outcome: Completed/Met Goal: LTG Patient will maintain dynamic standing with ADLs (OT) Description: LTG:  Patient will maintain dynamic standing balance with assist during activities of daily living (OT)  Outcome: Completed/Met   Problem: Sit to Stand Goal: LTG:  Patient will perform sit to stand in prep for activites of daily living with assistance level (OT) Description: LTG:  Patient will perform sit to stand in prep for activites of daily living with assistance level (OT) Outcome: Completed/Met   Problem: RH Grooming Goal: LTG Patient will perform grooming w/assist,cues/equip (OT) Description: LTG: Patient will perform grooming with assist, with/without cues using equipment (OT) Outcome: Completed/Met   Problem: RH Bathing Goal: LTG Patient will bathe all body parts with assist levels (OT) Description: LTG: Patient will bathe all body parts with assist levels (OT) Outcome: Completed/Met   Problem: RH Dressing Goal: LTG Patient will perform upper body dressing (OT) Description: LTG Patient will perform upper body dressing with assist, with/without cues (OT). Outcome: Completed/Met Goal: LTG Patient will perform lower body dressing w/assist (OT) Description: LTG: Patient will perform lower body dressing with assist, with/without cues in positioning using equipment (OT) Outcome: Completed/Met   Problem: RH Toileting Goal: LTG Patient will perform toileting task (3/3 steps) with assistance level (OT) Description: LTG: Patient will perform toileting task (3/3 steps) with assistance level (OT)  Outcome: Completed/Met   Problem: RH Toilet Transfers Goal: LTG Patient will perform toilet transfers w/assist (OT) Description: LTG: Patient will perform toilet transfers with assist,  with/without cues using equipment (OT) Outcome: Completed/Met   Problem: RH Tub/Shower Transfers Goal: LTG Patient will perform tub/shower transfers w/assist (OT) Description: LTG: Patient will perform tub/shower transfers with assist, with/without cues using equipment (OT) Outcome: Completed/Met   

## 2021-10-10 NOTE — Progress Notes (Signed)
Physical Therapy Session Note  Patient Details  Name: Leon Lyons MRN: [id number] Date of Birth: [birth year]  Today's Date: 10/10/2021 PT Missed Time: 48 Minutes Missed Time Reason: Pain;Patient fatigue  Short Term Goals: Week 1:  PT Short Term Goal 1 (Week 1): Pt will perform supine<>sit with min assist PT Short Term Goal 1 - Progress (Week 1): Progressing toward goal PT Short Term Goal 2 (Week 1): Pt will perform sit<>stands using LRAD with min assist PT Short Term Goal 2 - Progress (Week 1): Progressing toward goal PT Short Term Goal 3 (Week 1): Pt will perform bed<>chair transfers using LRAD with min assist PT Short Term Goal 3 - Progress (Week 1): Progressing toward goal PT Short Term Goal 4 (Week 1): Pt will ambulate at least 65ft using LRAD with mod assist and +2 w/c follow if needed PT Short Term Goal 4 - Progress (Week 1): Met Week 2:  PT Short Term Goal 1 (Week 2): Pt will perform bed mobility consistently with minA. PT Short Term Goal 1 - Progress (Week 2): Met PT Short Term Goal 2 (Week 2): Pt will perform sit to stand consistently with minA. PT Short Term Goal 2 - Progress (Week 2): Met PT Short Term Goal 3 (Week 2): Pt will perform bed to chair transfer consistently with minA. PT Short Term Goal 3 - Progress (Week 2): Met PT Short Term Goal 4 (Week 2): Pt will ambulate x150' with minA and LRAD. PT Short Term Goal 4 - Progress (Week 2): Met PT Short Term Goal 5 (Week 2): STGs = LTGs Week 3:     Skilled Therapeutic Interventions/Progress Updates:    Pt in bed on arrival, reporting extreme pain and unable to participate in session at this time. Pt declined any repositioning or toileting at this time.Pt missed 30 min of scheduled PT d/t pain and fatigue.   Therapy Documentation Precautions:  Precautions Precautions: Fall Precaution Comments: monitor BP, HR, and SpO2; possible L shoulder RC injury ? Restrictions Weight Bearing Restrictions: No General: PT Amount of  Missed Time (min): 30 Minutes PT Missed Treatment Reason: Pain;Patient fatigue    Therapy/Group: Individual Therapy  Mickel Fuchs 10/10/2021, 11:55 AM

## 2021-10-10 NOTE — Progress Notes (Signed)
Inpatient Rehabilitation Discharge Medication Review by a Pharmacist  A complete drug regimen review was completed for this patient to identify any potential clinically significant medication issues.  High Risk Drug Classes Is patient taking? Indication by Medication  Antipsychotic No   Anticoagulant Yes Lovenox for DVT/ PE history with malignancy  Antibiotic No Completed course 12/31  Opioid Yes Percocet for pain  Antiplatelet No   Hypoglycemics/insulin No   Vasoactive Medication Yes Diltiazem, metoprolol for BP/Afib  Chemotherapy No   Other No      Type of Medication Issue Identified Description of Issue Recommendation(s)  Drug Interaction(s) (clinically significant)     Duplicate Therapy     Allergy     No Medication Administration End Date     Incorrect Dose     Additional Drug Therapy Needed     Significant med changes from prior encounter (inform family/care partners about these prior to discharge).    Other       Clinically significant medication issues were identified that warrant physician communication and completion of prescribed/recommended actions by midnight of the next day:  No   Pharmacist comments: None  Time spent performing this drug regimen review (minutes):  20 minutes   Tad Moore 10/10/2021 9:24 AM

## 2021-10-10 NOTE — Progress Notes (Signed)
Physical Therapy Discharge Summary  Patient Details  Name: Leon Lyons MRN: [id number] Date of Birth: Jun 04, 1933  Today's Date: 10/10/2021 PT Individual Time: 1440-1455 PT Individual Time Calculation (min): 15 min    Patient has met 6 of 10 long term goals due to improved activity tolerance, improved balance, improved postural control, and increased strength.  Patient to discharge at an ambulatory level Min Assist. Pt ambulates with CGA and RW but still requires minA for bed mobility and for sit to stand from lower surfaces. Pt is able to perform sit to stand from bed height with CGA.  Patient's care partner is independent to provide the necessary physical assistance at discharge.  Reasons goals not met: Pt able to perform sit to stand from elevated bed height with CGA but continues to require minA from lower surfaces. [name] has been trained to assist with transfers. Pt also did not perform step training but did discuss technique and verbalized confidence in completing at home.  Recommendation:  Patient will benefit from ongoing skilled PT services in home health setting to continue to advance safe functional mobility, address ongoing impairments in strength, balance, ambulation, endurance, and minimize fall risk.  Equipment: Recommended Transport Chair  Reasons for discharge: discharge from hospital  Patient/family agrees with progress made and goals achieved: Yes  Skilled Therapeutic Interventions: Pt received supine in bed and reports that he is having one of the worst days he has had at CIR secondary to back pain. Pt requesting to rest at this time. PT provides education to pt and pt's wife regarding recommendations for mobility at home, encouraging pt to rest as needed but to prioritize getting out of bed and ambulating whenever pain and shortness of breath are not severe. Pt agreeable. PT also educates wife on hand placement and body mechanics to facilitate sit to stand  transfer. Wife verbalizes understanding. Pt left supine with alarm intact and all needs within reach.  PT Discharge Precautions/Restrictions Precautions Precautions: Fall Restrictions Weight Bearing Restrictions: No Pain Interference Pain Interference Pain Effect on Sleep: 2. Occasionally Pain Interference with Therapy Activities: 3. Frequently Pain Interference with Day-to-Day Activities: 3. Frequently Vision/Perception  Vision - History Ability to See in Adequate Light: 0 Adequate Perception Perception: Within Functional Limits Praxis Praxis: Intact  Cognition Overall Cognitive Status: Within Functional Limits for tasks assessed Arousal/Alertness: Awake/alert Safety/Judgment: Appears intact Sensation Sensation Light Touch: Appears Intact Coordination Gross Motor Movements are Fluid and Coordinated: No Fine Motor Movements are Fluid and Coordinated: Yes Coordination and Movement Description: Continues to have pain in L shoulder adn limited ROM, able to use functionally under 90 degrees pending pain Motor  Motor Motor: Abnormal postural alignment and control;Other (comment) Motor - Discharge Observations: Continues with generalized weakness  Mobility Bed Mobility Bed Mobility: Supine to Sit;Sit to Supine Supine to Sit: Minimal Assistance - Patient > 75% Sit to Supine: Minimal Assistance - Patient > 75% Transfers Transfers: Sit to Stand;Stand to Sit;Stand Pivot Transfers Sit to Stand: Minimal Assistance - Patient > 75% Stand to Sit: Minimal Assistance - Patient > 75% Stand Pivot Transfers: Minimal Assistance - Patient > 75% Stand Pivot Transfer Details: Tactile cues for sequencing;Tactile cues for weight shifting;Tactile cues for posture;Verbal cues for technique;Verbal cues for safe use of DME/AE;Manual facilitation for weight shifting;Tactile cues for initiation;Verbal cues for sequencing Transfer (Assistive device): Rolling walker Locomotion  Gait Ambulation:  Yes Gait Assistance: Contact Guard/Touching assist Gait Distance (Feet): 150 Feet Assistive device: Rolling walker Gait Gait: Yes Gait Pattern: Impaired Gait  Pattern: Trunk flexed;Decreased stride length Stairs / Additional Locomotion Stairs: No Wheelchair Mobility Wheelchair Mobility: No  Trunk/Postural Assessment  Cervical Assessment Cervical Assessment:  (forward head) Thoracic Assessment Thoracic Assessment:  (rounded shoulders) Lumbar Assessment Lumbar Assessment:  (posterior pelvic tilt) Postural Control Postural Control: Deficits on evaluation Righting Reactions: delayed; posterior lean in standing, improved from eval  Balance Balance Balance Assessed: Yes Static Sitting Balance Static Sitting - Balance Support: Feet supported Static Sitting - Level of Assistance: 5: Stand by assistance Dynamic Sitting Balance Dynamic Sitting - Balance Support: During functional activity Dynamic Sitting - Level of Assistance: 5: Stand by assistance Static Standing Balance Static Standing - Balance Support: During functional activity Static Standing - Level of Assistance: 5: Stand by assistance Dynamic Standing Balance Dynamic Standing - Balance Support: During functional activity Dynamic Standing - Level of Assistance: 4: Min assist Extremity Assessment  RLE Assessment RLE Assessment: Exceptions to [facility] General Strength Comments: Grossly 4-/5 LLE Assessment LLE Assessment: Exceptions to [facility] General Strength Comments: Grossly 3+/5    Breck Coons, PT, DPT 10/10/2021, 3:50 PM

## 2021-10-10 NOTE — Progress Notes (Signed)
Physical Therapy Session Note  Patient Details  Name: Leon Lyons MRN: [id number] Date of Birth: [birth year]  Today's Date: 10/10/2021 PT Missed Time: 77 Minutes Missed Time Reason: Pain;Patient fatigue  Short Term Goals: Week 1:  PT Short Term Goal 1 (Week 1): Pt will perform supine<>sit with min assist PT Short Term Goal 1 - Progress (Week 1): Progressing toward goal PT Short Term Goal 2 (Week 1): Pt will perform sit<>stands using LRAD with min assist PT Short Term Goal 2 - Progress (Week 1): Progressing toward goal PT Short Term Goal 3 (Week 1): Pt will perform bed<>chair transfers using LRAD with min assist PT Short Term Goal 3 - Progress (Week 1): Progressing toward goal PT Short Term Goal 4 (Week 1): Pt will ambulate at least 20ft using LRAD with mod assist and +2 w/c follow if needed PT Short Term Goal 4 - Progress (Week 1): Met Week 2:  PT Short Term Goal 1 (Week 2): Pt will perform bed mobility consistently with minA. PT Short Term Goal 1 - Progress (Week 2): Met PT Short Term Goal 2 (Week 2): Pt will perform sit to stand consistently with minA. PT Short Term Goal 2 - Progress (Week 2): Met PT Short Term Goal 3 (Week 2): Pt will perform bed to chair transfer consistently with minA. PT Short Term Goal 3 - Progress (Week 2): Met PT Short Term Goal 4 (Week 2): Pt will ambulate x150' with minA and LRAD. PT Short Term Goal 4 - Progress (Week 2): Met PT Short Term Goal 5 (Week 2): STGs = LTGs Week 3:     Skilled Therapeutic Interventions/Progress Updates:  Pt received supine in recliner, wife present. Pt very tearful and reported 10/10 pain "everywhere", had recently received pain medication. Offered modalities and repositioning for pain modulation, which pt and wife politely declined. Provided emotional support to wife as needed, who was visibly upset regarding state of husband. Pt was left supine in recliner, all needs in reach. Missed 45 minutes of skilled PT due to pt pain and  fatigue.   Therapy Documentation Precautions:  Precautions Precautions: Fall Precaution Comments: monitor BP, HR, and SpO2; possible L shoulder RC injury ? Restrictions Weight Bearing Restrictions: No   Therapy/Group: Individual Therapy Cruzita Lederer Mauriana Dann, PT, DPT  10/10/2021, 7:51 AM

## 2021-10-11 ENCOUNTER — Other Ambulatory Visit: Payer: Self-pay | Admitting: Oncology

## 2021-10-11 ENCOUNTER — Telehealth: Payer: Self-pay | Admitting: Internal Medicine

## 2021-10-11 ENCOUNTER — Other Ambulatory Visit: Payer: Medicare Other

## 2021-10-11 DIAGNOSIS — R5381 Other malaise: Secondary | ICD-10-CM | POA: Diagnosis not present

## 2021-10-11 LAB — BASIC METABOLIC PANEL
Anion gap: 5 (ref 5–15)
BUN: 11 mg/dL (ref 8–23)
CO2: 39 mmol/L — ABNORMAL HIGH (ref 22–32)
Calcium: 8.9 mg/dL (ref 8.9–10.3)
Chloride: 90 mmol/L — ABNORMAL LOW (ref 98–111)
Creatinine, Ser: 0.58 mg/dL — ABNORMAL LOW (ref 0.61–1.24)
GFR, Estimated: >60 mL/min (ref >60)
Glucose, Bld: 93 mg/dL (ref 70–99)
Potassium: 3.8 mmol/L (ref 3.5–5.1)
Sodium: 134 mmol/L — ABNORMAL LOW (ref 135–145)

## 2021-10-11 LAB — MAGNESIUM: Magnesium: 1.9 mg/dL (ref 1.7–2.4)

## 2021-10-11 MED ORDER — DICLOFENAC EPOLAMINE 1.3 % EX PTCH: 1.0000 | MEDICATED_PATCH | Freq: Two times a day (BID) | CUTANEOUS | 0 refills | Status: AC

## 2021-10-11 MED ORDER — MUSCLE RUB 10-15 % EX CREA: 1.0000 "application " | TOPICAL_CREAM | Freq: Two times a day (BID) | CUTANEOUS | 0 refills | Status: AC

## 2021-10-11 MED ORDER — BUDESONIDE 0.25 MG/2ML IN SUSP: 0.2500 mg | Freq: Two times a day (BID) | RESPIRATORY_TRACT | 1 refills | Status: DC

## 2021-10-11 MED ORDER — OXYCODONE-ACETAMINOPHEN 7.5-325 MG PO TABS: 2.0000 | ORAL_TABLET | Freq: Four times a day (QID) | ORAL | 0 refills | Status: DC | PRN

## 2021-10-11 MED ORDER — SENNOSIDES-DOCUSATE SODIUM 8.6-50 MG PO TABS: 2.0000 | ORAL_TABLET | Freq: Every day | ORAL | 0 refills | Status: AC

## 2021-10-11 MED ORDER — DM-GUAIFENESIN ER 30-600 MG PO TB12: 1.0000 | ORAL_TABLET | Freq: Two times a day (BID) | ORAL | 0 refills | Status: AC

## 2021-10-11 MED ORDER — LEVALBUTEROL HCL 0.63 MG/3ML IN NEBU: 0.6300 mg | INHALATION_SOLUTION | Freq: Two times a day (BID) | RESPIRATORY_TRACT | 1 refills | Status: AC

## 2021-10-11 MED ORDER — ADULT MULTIVITAMIN W/MINERALS CH: 1.0000 | ORAL_TABLET | Freq: Every day | ORAL | Status: AC

## 2021-10-11 MED ORDER — SALINE SPRAY 0.65 % NA SOLN: 1.0000 | NASAL | 0 refills | Status: AC | PRN

## 2021-10-11 NOTE — Progress Notes (Signed)
Patient slept intermittently with occasional confusion while awake. Easily reoriented. Bathroom privileges provided as needed. Pain pill X1 this shift. Repositioned for comfort. Safety maintained at all times.

## 2021-10-11 NOTE — Progress Notes (Signed)
PA in with family/pt to discuss discharge instructions. Belongings gathered. Pt/family verbalize understanding of discharge instructions with no further questions. Family instructed how to use nebulizer device at home. Education materials given to family.  Pt left per wheelchair to private vehicle. No complications noted. Sheela Stack, LPN

## 2021-10-11 NOTE — Progress Notes (Signed)
Patient ID: Leon Lyons, male   DOB: [birth year], 88 y.o.   MRN: [id number]  SW received updates from pt wife that she has not received any follow-up about DME.   SW made contact with [employer] to discuss delivery.Oxygen to be delivered today. *SW confirms oxygen delivered by 3pm today.   Loralee Pacas, MSW, [place] Office: [phone] Cell: [phone] Fax: [phone]

## 2021-10-11 NOTE — Telephone Encounter (Signed)
Scheduled per 01/03 scheduled message, called and spoke with patient's daughter. Patient will be notified.

## 2021-10-11 NOTE — Progress Notes (Signed)
PROGRESS NOTE   Subjective/Complaints:  Low back pain improved with higher dose percocet but wife notes slightly more confusion , reviewed xray, repeat BMET DIscussed RIght sacral ala met as etiology of sacral pain  ROS: Patient denies CP, SOB, N/V/D   Objective:   DG Sacrum/Coccyx  Result Date: 10/10/2021 CLINICAL DATA:  Lower back and sacral pain, back pain, known history of metastatic lung cancer EXAM: SACRUM AND COCCYX - 2+ VIEW COMPARISON:  09/10/2021. FINDINGS: Pelvic inlet, pelvic outlet, and lateral views of the sacrum and coccyx are obtained. There are no acute displaced fractures. The known lytic lesion within the right sacral ala seen on prior CT examinations is not well visualized by radiograph. Alignment is anatomic. Prominent spondylosis and facet hypertrophy at the lumbosacral junction. Aorto iliac stent graft is identified. Peripherally calcified pelvic fluid collection again seen in the left lower quadrant. IMPRESSION: 1. The known lytic metastatic lesion in the right sacral ala seen on prior CT is not visible by radiograph. Please refer to CT examination 09/10/2021. 2. No other acute bony abnormalities. 3. Moderate spondylosis and facet hypertrophy at the lumbosacral junction. 4. Stable peripherally calcified pelvic fluid collection. Electronically Signed   By: Randa Ngo M.D.   On: 10/10/2021 15:30   No results for input(s): WBC, HGB, HCT, PLT in the last 72 hours.   Recent Labs    10/10/21 0332 10/11/21 0651  NA 135 134*  K 3.2* 3.8  CL 89* 90*  CO2 39* 39*  GLUCOSE 94 93  BUN 13 11  CREATININE 0.47* 0.58*  CALCIUM 8.7* 8.9      Intake/Output Summary (Last 24 hours) at 10/11/2021 0833 Last data filed at 10/11/2021 0600 Gross per 24 hour  Intake 250 ml  Output 1670 ml  Net -1420 ml         Physical Exam: Vital Signs Blood pressure 115/75, pulse 70, temperature 98 F (36.7 C), resp. rate 16,  height $Remov'5\' 10"'gWXrZL$  (1.778 m), weight 74 kg, SpO2 95 %.   General: No acute distress Mood and affect are appropriate Heart: Regular rate and rhythm no rubs murmurs or extra sounds Lungs: Clear to auscultation, breathing unlabored, no rales or wheezes Abdomen: Positive bowel sounds, soft nontender to palpation, nondistended Extremities: No clubbing, cyanosis, or edema Skin: No evidence of breakdown, no evidence of rash Musculoskeletal:     low back pain. Pain left scapula--stable   . LE edema improved, trace at left elbow    Fibrous knot in upper,medial calf.  ?scar tissue. Doesn't feel like muscle Neurological:     Comments: Intact to light touch in all 4 extremities B/L  CN's intact on exam. Good insight and awareness. Ox3- appropriate   RUE- 5-/5 in deltoid, biceps, triceps, WE, grip and FA LUE- deltoid 2-/5; weak vs pain; Biceps 4-/5; Triceps 3-/5; WE 4-/5 grip 4-/5; FA 4-/5 RLE- 4+/5 in HF, KE, KF DF and PF LLE- Proximal 3to 3+5; distal 4-/5--stable Skin:    General: Skin is warm and dry.     groin and abdominal wounds are clean/dry       Assessment/Plan: 1. Functional deficits Stable for D/C today F/u PCP in 3-4 weeks Palliative  to assume pain management  F/u PM&R prn See D/C summary See D/C instructions  Care Tool:  Bathing    Body parts bathed by patient: Right arm, Left arm, Chest, Face, Abdomen, Front perineal area, Right upper leg, Left upper leg, Right lower leg, Left lower leg, Buttocks   Body parts bathed by helper: Abdomen, Front perineal area, Buttocks, Right upper leg, Left upper leg, Right lower leg, Left lower leg     Bathing assist Assist Level: Minimal Assistance - Patient > 75%     Upper Body Dressing/Undressing Upper body dressing   What is the patient wearing?: Button up shirt    Upper body assist Assist Level: Contact Guard/Touching assist    Lower Body Dressing/Undressing Lower body dressing      What is the patient wearing?: Pants      Lower body assist Assist for lower body dressing: Minimal Assistance - Patient > 75%     Toileting Toileting    Toileting assist Assist for toileting: Minimal Assistance - Patient > 75%     Transfers Chair/bed transfer  Transfers assist  Chair/bed transfer activity did not occur: Refused  Chair/bed transfer assist level: Minimal Assistance - Patient > 75% Chair/bed transfer assistive device: Programmer, multimedia   Ambulation assist   Ambulation activity did not occur: Safety/medical concerns  Assist level: Contact Guard/Touching assist Assistive device: Walker-rolling Max distance: 150'   Walk 10 feet activity   Assist  Walk 10 feet activity did not occur: Safety/medical concerns  Assist level: Contact Guard/Touching assist Assistive device: Walker-rolling   Walk 50 feet activity   Assist Walk 50 feet with 2 turns activity did not occur: Safety/medical concerns  Assist level: Contact Guard/Touching assist Assistive device: Walker-rolling    Walk 150 feet activity   Assist Walk 150 feet activity did not occur: Safety/medical concerns  Assist level: Contact Guard/Touching assist Assistive device: Walker-rolling    Walk 10 feet on uneven surface  activity   Assist Walk 10 feet on uneven surfaces activity did not occur: Safety/medical concerns         Wheelchair     Assist Is the patient using a wheelchair?: No Type of Wheelchair: Manual    Wheelchair assist level: Supervision/Verbal cueing Max wheelchair distance: 40    Wheelchair 50 feet with 2 turns activity    Assist        Assist Level: Dependent - Patient 0%   Wheelchair 150 feet activity     Assist      Assist Level: Dependent - Patient 0%   Blood pressure 115/75, pulse 70, temperature 98 F (36.7 C), resp. rate 16, height $RemoveBe'5\' 10"'lIDHcltQm$  (1.778 m), weight 74 kg, SpO2 95 %.  Assessment/Plan: Functional deficits secondary  to Debility from MSSA  bactermia/PE/endovascular repair of aortic leak; PE and Previous styage IIIB lung CA with new hepatic and bone metastases.  D/C 10/11/21  2. DVT/R lieofemoral DVT- Lovenox 70 mg BID- until CT in 4 weeks by Vascular with potential switch to oral 3. Pain Management: Tylenola nd Percocet as needed for pain- for LUE/LLE and back pain .   Continue Gabapentin 300 mg QHS-   - change percocet to 7.$RemoveBefor'5mg'WNAdmIemfFid$  2 tab q 6 prn- palliative to assume pain management post d/c Check xray of sacrum , check for metastatic disease  4. Mood:-  Palliative care following due to his cancer and ego support as required-  ?neuropsychology    5. Skin/Wound Care: encouraging nutrition  -wounds healing 6. Marland Kitchen  Fluids/Electrolytes/Nutrition: encourage PO Hypo K+ will supplement prior to d/c 7. MSSA bacteremia- is on Cefazolin q8 hours for 4 weeks from 09/09/21 -thoracentesis cultures are negative. Fungal cx with gs still pending  8. Persistent Afib- is recent diagnosis- continue Metoprolol just increased to 75 mg BID and on Diltiazem 120 mg daily- monitor for hypotension and hold meds if BP <100.    Vitals:   10/10/21 2013 10/11/21 0527  BP: 130/62 115/75  Pulse: 91 70  Resp:    Temp: 98.9 F (37.2 C) 98 F (36.7 C)  SpO2: 99% 95%     -appreciate cards consult and recs.  -rec transition to eliquis $RemoveBe'5mg'CkLRKSnFC$  bid from lovenox (see #2) -cards has signed off-->outpt f/u with Dr. Sallyanne Kuster 9. S/p aortic leak/endovascular repair- periaortic hematoma- vascular to reorder at CT in 4 weeks to follow up 10. Previous Stage IIIB Lung CA- with new hepatic and bone mets- likely Stage IV-  -palliative care follow up appreciated, discussed palliative care with wife- she has reached out to patient's PCP to discuss.  -pt is DNR - F/U with Oncology and maybe hospice after d/c.   - continued DOE  -continue oxygen  -xopenex and pulmicort scheduled HHN have helped  -decreased xopenex to bid along with pulmicort  -  scheduled mucinex dm to help him  mobilize secretions  -CXR 12/26 demonstrates complete white-out of left lung   -pulmonary performed thoracentesis12/28    -1300cc of straw colored fluid drained  12/30--albumin/lasix, diruesis per PCCM    -recent CT reveals mass, fluid, infiltrate, scarring in lung    -BUN/Cr holding today   Discussed outpatient thoracentesis prn 11. Constipation-    - moved bowels 12/29 12. PICC line for IV ABX- as of 09/17/21 13. LE Edema--improved with ACE wraps and elevation, nutrition 14. Hypokalemia: will not add additional supplementation right now given his low BP, continue to monitor tomorrow.       LOS: 24 days A FACE TO [place] 10/11/2021, 8:33 AM

## 2021-10-11 NOTE — Progress Notes (Signed)
PROGRESS NOTE   Subjective/Complaints:   Pt reports a little pain in R low back- just got pain meds.  Spilled his milk in bed and was cleaned up.  LBM yesterday.    ROS:  Pt denies SOB, abd pain, CP, N/V/C/D, and vision changes   Objective:   DG Sacrum/Coccyx  Result Date: 10/10/2021 CLINICAL DATA:  Lower back and sacral pain, back pain, known history of metastatic lung cancer EXAM: SACRUM AND COCCYX - 2+ VIEW COMPARISON:  09/10/2021. FINDINGS: Pelvic inlet, pelvic outlet, and lateral views of the sacrum and coccyx are obtained. There are no acute displaced fractures. The known lytic lesion within the right sacral ala seen on prior CT examinations is not well visualized by radiograph. Alignment is anatomic. Prominent spondylosis and facet hypertrophy at the lumbosacral junction. Aorto iliac stent graft is identified. Peripherally calcified pelvic fluid collection again seen in the left lower quadrant. IMPRESSION: 1. The known lytic metastatic lesion in the right sacral ala seen on prior CT is not visible by radiograph. Please refer to CT examination 09/10/2021. 2. No other acute bony abnormalities. 3. Moderate spondylosis and facet hypertrophy at the lumbosacral junction. 4. Stable peripherally calcified pelvic fluid collection. Electronically Signed   By: Randa Ngo M.D.   On: 10/10/2021 15:30   No results for input(s): WBC, HGB, HCT, PLT in the last 72 hours.   Recent Labs    10/10/21 0332 10/11/21 0651  NA 135 134*  K 3.2* 3.8  CL 89* 90*  CO2 39* 39*  GLUCOSE 94 93  BUN 13 11  CREATININE 0.47* 0.58*  CALCIUM 8.7* 8.9     Intake/Output Summary (Last 24 hours) at 10/11/2021 0831 Last data filed at 10/11/2021 0600 Gross per 24 hour  Intake 250 ml  Output 1670 ml  Net -1420 ml        Physical Exam: Vital Signs Blood pressure 115/75, pulse 70, temperature 98 F (36.7 C), resp. rate 16, height 5\' 10"  (1.778 m),  weight 74 kg, SpO2 95 %.   General: awake, alert, appropriate, sitting up in bed; on 4L O2 by [place]; NAD HENT: conjugate gaze; oropharynx moist CV: tachycardic in 110s currently; no JVD Pulmonary: diffuse rhonchi L>R- adequate air movement GI: soft, NT, ND, (+)BS Psychiatric: appropriate; interactive Neurological: alert Skin: intact Musculoskeletal:     low back pain. Pain left scapula--stable   . LE edema improved, trace at left elbow    Fibrous knot in upper,medial calf.  ?scar tissue. Doesn't feel like muscle Neurological:     Comments: Intact to light touch in all 4 extremities B/L  CN's intact on exam. Good insight and awareness. Ox3- appropriate   RUE- 5-/5 in deltoid, biceps, triceps, WE, grip and FA LUE- deltoid 2-/5; weak vs pain; Biceps 4-/5; Triceps 3-/5; WE 4-/5 grip 4-/5; FA 4-/5 RLE- 4+/5 in HF, KE, KF DF and PF LLE- Proximal 3to 3+5; distal 4-/5--stable Skin:    General: Skin is warm and dry.     groin and abdominal wounds are clean/dry       Assessment/Plan: 1. Functional deficits which require 3+ hours per day of interdisciplinary therapy in a comprehensive inpatient rehab setting.  Physiatrist is providing close team supervision and 24 hour management of active medical problems listed below. Physiatrist and rehab team continue to assess barriers to discharge/monitor patient progress toward functional and medical goals  Care Tool:  Bathing    Body parts bathed by patient: Right arm, Left arm, Chest, Face, Abdomen, Front perineal area, Right upper leg, Left upper leg, Right lower leg, Left lower leg, Buttocks   Body parts bathed by helper: Abdomen, Front perineal area, Buttocks, Right upper leg, Left upper leg, Right lower leg, Left lower leg     Bathing assist Assist Level: Minimal Assistance - Patient > 75%     Upper Body Dressing/Undressing Upper body dressing   What is the patient wearing?: Button up shirt    Upper body assist Assist Level: Contact  Guard/Touching assist    Lower Body Dressing/Undressing Lower body dressing      What is the patient wearing?: Pants     Lower body assist Assist for lower body dressing: Minimal Assistance - Patient > 75%     Toileting Toileting    Toileting assist Assist for toileting: Minimal Assistance - Patient > 75%     Transfers Chair/bed transfer  Transfers assist  Chair/bed transfer activity did not occur: Refused  Chair/bed transfer assist level: Minimal Assistance - Patient > 75% Chair/bed transfer assistive device: Programmer, multimedia   Ambulation assist   Ambulation activity did not occur: Safety/medical concerns  Assist level: Contact Guard/Touching assist Assistive device: Walker-rolling Max distance: 150'   Walk 10 feet activity   Assist  Walk 10 feet activity did not occur: Safety/medical concerns  Assist level: Contact Guard/Touching assist Assistive device: Walker-rolling   Walk 50 feet activity   Assist Walk 50 feet with 2 turns activity did not occur: Safety/medical concerns  Assist level: Contact Guard/Touching assist Assistive device: Walker-rolling    Walk 150 feet activity   Assist Walk 150 feet activity did not occur: Safety/medical concerns  Assist level: Contact Guard/Touching assist Assistive device: Walker-rolling    Walk 10 feet on uneven surface  activity   Assist Walk 10 feet on uneven surfaces activity did not occur: Safety/medical concerns         Wheelchair     Assist Is the patient using a wheelchair?: No Type of Wheelchair: Manual    Wheelchair assist level: Supervision/Verbal cueing Max wheelchair distance: 40    Wheelchair 50 feet with 2 turns activity    Assist        Assist Level: Dependent - Patient 0%   Wheelchair 150 feet activity     Assist      Assist Level: Dependent - Patient 0%   Blood pressure 115/75, pulse 70, temperature 98 F (36.7 C), resp. rate 16, height  5\' 10"  (1.778 m), weight 74 kg, SpO2 95 %.  Assessment/Plan: Functional deficits secondary  to Debility from MSSA bactermia/PE/endovascular repair of aortic leak; PE and Previous styage IIIB lung CA with new hepatic and bone metastases.  ELOS-  10/11/21 Pt may shower -Continue CIR therapies including PT, OT    Discussed with palliative care plan for outpatient palliative care follow-up. Home with palliative care, transition to hospice if no improvement  1/3- Con't CIR- PT and OT-   2. DVT/R lieofemoral DVT- Lovenox 70 mg BID- until CT in 4 weeks by Vascular with potential switch to oral 3. Pain Management: Tylenola nd Percocet as needed for pain- for LUE/LLE and back pain .   Continue Gabapentin 300 mg  QHS-   - change percocet to 7.5mg  2 tab q 6 prn Check xray of sacrum , check for metastatic disease   1/3- pt's Xray didn't show R sacral ala known mets- fyi- con't pain meds- working per pt.  4. Mood:-  Palliative care following due to his cancer and ego support as required-  ?neuropsychology    5. Skin/Wound Care: encouraging nutrition  -wounds healing 6. . Fluids/Electrolytes/Nutrition: encourage PO Hypo K+ will supplement prior to d/c 1/3- K+ up to 3.8- cont' to monitor 7. MSSA bacteremia- is on Cefazolin q8 hours for 4 weeks from 09/09/21 -thoracentesis cultures are negative. Fungal cx with gs still pending  8. Persistent Afib- is recent diagnosis- continue Metoprolol just increased to 75 mg BID and on Diltiazem 120 mg daily- monitor for hypotension and hold meds if BP <100.    Vitals:   10/10/21 2013 10/11/21 0527  BP: 130/62 115/75  Pulse: 91 70  Resp:    Temp: 98.9 F (37.2 C) 98 F (36.7 C)  SpO2: 99% 95%    -appreciate cards consult and recs.  -rec transition to eliquis 5mg  bid from lovenox (see #2) -cards has signed off-->outpt f/u with Dr. Sallyanne Kuster 9. S/p aortic leak/endovascular repair- periaortic hematoma- vascular to reorder at CT in 4 weeks to follow up 10. Previous  Stage IIIB Lung CA- with new hepatic and bone mets- likely Stage IV-  -palliative care follow up appreciated, discussed palliative care with wife- she has reached out to patient's PCP to discuss.  -pt is DNR - F/U with Oncology and maybe hospice after d/c.   - continued DOE  -continue oxygen  -xopenex and pulmicort scheduled HHN have helped  -decreased xopenex to bid along with pulmicort  -  scheduled mucinex dm to help him mobilize secretions  -CXR 12/26 demonstrates complete white-out of left lung   -pulmonary performed thoracentesis12/28    -1300cc of straw colored fluid drained  12/30--albumin/lasix, diruesis per PCCM    -recent CT reveals mass, fluid, infiltrate, scarring in lung    -BUN/Cr holding today   Discussed outpatient thoracentesis prn 11. Constipation-    - moved bowels 12/29  1/3- LBM yesterday 12. PICC line for IV ABX- as of 09/17/21 13. LE Edema--improved with ACE wraps and elevation, nutrition       LOS: 24 days A FACE TO FACE EVALUATION WAS PERFORMED  Macarena Langseth 10/11/2021, 8:31 AM

## 2021-10-12 ENCOUNTER — Other Ambulatory Visit: Payer: Self-pay | Admitting: *Deleted

## 2021-10-12 ENCOUNTER — Telehealth: Payer: Self-pay

## 2021-10-12 ENCOUNTER — Telehealth: Payer: Self-pay | Admitting: *Deleted

## 2021-10-12 NOTE — Patient Outreach (Signed)
[place] [facility]) Care Management  [place]  4/0/9811   [place] [birth year] [id number]   Post discharge call placed to member, successful to wife.  Denies any urgent concerns, encouraged to contact this care manager with questions.    Encounter Medications:  Outpatient Encounter Medications as of 10/12/2021  Medication Sig Note   acetaminophen (TYLENOL) 500 MG tablet Take 500-1,000 mg by mouth every 8 (eight) hours as needed (for pain).    alum & mag hydroxide-simeth (MAALOX/MYLANTA) 200-200-20 MG/5ML suspension Take 15-30 mLs by mouth every 2 (two) hours as needed for indigestion.    bisacodyl (DULCOLAX) 10 MG suppository Place 1 suppository (10 mg total) rectally daily as needed for moderate constipation.    budesonide (PULMICORT) 0.25 MG/2ML nebulizer solution Take 2 mLs (0.25 mg total) by nebulization 2 (two) times daily.    dextromethorphan-guaiFENesin (MUCINEX DM) 30-600 MG 12hr tablet Take 1 tablet by mouth 2 (two) times daily.    diclofenac (FLECTOR) 1.3 % PTCH Place 1 patch onto the skin 2 (two) times daily. Apply to left shoulder    diltiazem (CARDIZEM) 120 MG tablet Take 120 mg by mouth daily. 09/09/2021: Med/strength/sig confirmed by Nira Conn" @ [facility] in [place] (called X5 to get a live person)   enoxaparin (LOVENOX) 80 MG/0.8ML injection Inject 70 mg into the skin 2 (two) times daily. 09/09/2021: LF on 09/02/2021   feeding supplement (ENSURE ENLIVE / ENSURE PLUS) LIQD Take 237 mLs by mouth 2 (two) times daily between meals.    folic acid (FOLVITE) 1 MG tablet Take 1 tablet (1 mg total) by mouth daily.    levalbuterol (XOPENEX) 0.63 MG/3ML nebulizer solution Take 3 mLs (0.63 mg total) by nebulization 2 (two) times daily.    Menthol-Methyl Salicylate (MUSCLE RUB) 10-15 % CREA Apply 1 application topically 2 (two) times daily. Left shoulder blade    metoprolol tartrate (LOPRESSOR) 50 MG tablet Take 1 tablet (50 mg total) by mouth 2 (two) times  daily. Hold if systolic pressure <784 or heart rate <65    Multiple Vitamin (MULTIVITAMIN WITH MINERALS) TABS tablet Take 1 tablet by mouth daily.    oxyCODONE-acetaminophen (PERCOCET) 7.5-325 MG tablet Take 2 tablets by mouth every 6 (six) hours as needed for severe pain.    pantoprazole (PROTONIX) 40 MG tablet Take 1 tablet (40 mg total) by mouth daily.    polyethylene glycol (MIRALAX / GLYCOLAX) 17 g packet Take 17 g by mouth daily.    rosuvastatin (CRESTOR) 5 MG tablet Take 1 tablet (5 mg total) by mouth daily.    senna-docusate (SENOKOT-S) 8.6-50 MG tablet Take 2 tablets by mouth at bedtime.    sodium chloride (OCEAN) 0.65 % SOLN nasal spray Place 1 spray into both nostrils as needed for congestion.    No facility-administered encounter medications on file as of 10/12/2021.    Functional Status:  In your present state of health, do you have any difficulty performing the following activities: 09/19/2021 09/18/2021  Hearing? - N  Vision? - N  Difficulty concentrating or making decisions? - N  Walking or climbing stairs? - Y  Dressing or bathing? - Y  Doing errands, shopping? N -  Some recent data might be hidden    Fall/Depression Screening: Fall Risk  08/12/2021  Falls in the past year? 0  Number falls in past yr: 0  Injury with Fall? 0   PHQ 2/9 Scores 08/12/2021  PHQ - 2 Score 0    Assessment:   Care Plan  Care Plan : [facility] Plan of Care (Adult)  Updates made by Valente David, RN since 10/12/2021 12:00 AM     Problem: Management of health conditions post surgery   Priority: High     Long-Range Goal: Member will report and display recovery from surgery evidenced by no readmissions, infections, or cancer complications   Start Date: 08/12/2021  Expected End Date: 02/09/2022  This Visit's Progress: Not on track  Priority: High  Note:   Current Barriers:  Knowledge Deficits related to plan of care for management of Post surgery for Cecal Volvulus, and lung cancer  complications Chronic Disease Management support and education needs related to Post surgery for cecal volvulus  RNCM Clinical Goal(s):  Patient will verbalize understanding of plan for management of Post surgery for cecal volvulus and lung cancer complications take all medications exactly as prescribed and will call provider for medication related questions attend all scheduled medical appointments: Oncology, vascular,  continue to work with RN Care Manager to address care management and care coordination needs related to  cecal volvulus, post surgery and lung cancer complications work with PCP to obtain [facility] services  through collaboration with Consulting civil engineer, provider, and care team.   Interventions: Inter-disciplinary care team collaboration (see longitudinal plan of care) Evaluation of current treatment plan related to  self management and patient's adherence to plan as established by provider  Post surgical care for cecal volvulusGoal on track:  Yes. Evaluation of current treatment plan related to  Surgery for cecal volvulus , self-management and patient's adherence to plan as established by provider. Discussed plans with patient for ongoing care management follow up and provided patient with direct contact information for care management team Advised patient to call PCP if no call from [place] within the next week; Reviewed medications with patient and discussed pain management; Collaborated with Member regarding need for [facility] services (PT and nursing); Reviewed scheduled/upcoming provider appointments including; Discussed plans with patient for ongoing care management follow up and provided patient with direct contact information for care management team; Encouraged to use incentive spirometry to decrease risk of pneumonia    Oncology:  (Status: New goal.) Long Term Goal Assessment of understanding of oncology diagnosis:  Assessed patient understanding of cancer diagnosis and  recommended treatment plan, Reviewed upcoming provider appointments and treatment appointments, Assessed available transportation to appointments and treatments. Has consistent/reliable transportation: Yes, and Assessed support system. Has consistent/reliable family or other support: Yes   Patient Goals/Self-Care Activities: Patient will self administer medications as prescribed Patient will attend all scheduled provider appointments Patient will continue to perform ADL's independently Patient will call provider office for new concerns or questions  Follow Up Plan:  Telephone follow up appointment with care management team member scheduled for:  10/18/2021    Update 1/4 -  Patient was admitted to hospital 12/2-12/10 for dehydration, pleural effusion, aortic leak requiring vascular repair, and MSSA bacteremia.  He was admitted to CIR for debility and ongoing therapy to recover and improve strength, discharged 1/3 home with home health and palliative care referral.  Spoke with member's wife, state member remains weak and slow to recover.  Admits she is overwhelmed with caring for member and making sure he has all that is needed for his care.  They do have friends and family that are helping with management of his care as well.  She has already reached out to [facility] to inquire about start of services for home health, waiting for call back.  She  has yet to hear from Palliative care team, will continue to wait for their call.  Member has follow up appointments scheduled with oncology on 1/12, [facility] on 1/13, repeat CT on 1/19, vascular surgeon follow up on 1/26, and cardiology on 2/7.  She has yet to call PCP office, will do that today.    Does not have all medications that were prescribed at discharge, [facility], medications are ready with the exception of Levalbuterol as they are waiting on a shipment to be delivered.  Wife aware, will pick up. She also inquires about a transport  wheelchair that will make it easier to get member to appointments, waiting on delivery.  Call placed to [employer], notified that this will be delivered today, wife aware.            Plan:  Follow-up: Patient agrees to Care Plan and Follow-up. Follow-up in 1 week(s).  Valente David, RN, MSN, [place] Manager [phone]

## 2021-10-12 NOTE — Progress Notes (Signed)
Inpatient Rehabilitation Care Coordinator Discharge Note   Patient Details  Name: Leon Lyons MRN: [id number] Date of Birth: [birth year]   Discharge location: D/c to home with wife who will provide 24/7 care.  Length of Stay: 22 days  Discharge activity level: Min A  Home/community participation: Limited  Patient response WU:[id number] Literacy - How often do you need to have someone help you when you read instructions, pamphlets, or other written material from your doctor or pharmacy?: Never  Patient response YN:WGNFAO Isolation - How often do you feel lonely or isolated from those around you?: Never  Services provided included: MD, RD, PT, OT, RN, CM, Pharmacy, Neuropsych, SW, TR  Financial Services:  Charity fundraiser Utilized: [employer]  Choices offered to/list presented to: Yes  Follow-up services arranged:  [place], DME Home Health Agency: [facility] for HHPT/OT/aide/SN    DME : [facility] for oxygen and transport chair    Patient response to transportation need: Is the patient able to respond to transportation needs?: Yes In the past 12 months, has lack of transportation kept you from medical appointments or from getting medications?: No In the past 12 months, has lack of transportation kept you from meetings, work, or from getting things needed for daily living?: No   Comments (or additional information):  Patient/Family verbalized understanding of follow-up arrangements:  Yes  Individual responsible for coordination of the follow-up plan: contact pt wife Leon Lyons ([phone])  Confirmed correct DME delivered: [name] 10/12/2021    [name]

## 2021-10-12 NOTE — Telephone Encounter (Signed)
Patient wife called stating that Diltiazem, Metoprolol tartrate was not sent to pharmacy at discharged. Patient has not had this medication yesterday nor today.

## 2021-10-12 NOTE — Telephone Encounter (Signed)
Called the pt and had to [facility].

## 2021-10-12 NOTE — Telephone Encounter (Signed)
-----   Message from Candee Furbish, MD sent at 10/08/2021 10:00 AM EST ----- Regarding: 2-3 week f/u with CXR w/ BI Getting Dc'd 1/3 Brad he's going hospice, has pleural involvement of his cancer now with symptomatic effusion; did not want pleurX but would be okay with thora PRN; may want to check closer to his appt time to see if he is still (A) alive and (B) in a state to come to office  Linna Hoff

## 2021-10-13 ENCOUNTER — Encounter: Payer: Medicare Other | Admitting: Vascular Surgery

## 2021-10-13 ENCOUNTER — Telehealth: Payer: Self-pay

## 2021-10-13 NOTE — Telephone Encounter (Signed)
Leon Lyons with [facility] states the pt and his wife wish to convert to Hospice.

## 2021-10-14 ENCOUNTER — Telehealth: Payer: Self-pay

## 2021-10-14 LAB — FUNGAL ORGANISM REFLEX

## 2021-10-14 LAB — FUNGUS CULTURE WITH STAIN

## 2021-10-14 LAB — FUNGUS CULTURE RESULT

## 2021-10-14 NOTE — Telephone Encounter (Signed)
Left message for patient's wife to call back.  

## 2021-10-14 NOTE — Telephone Encounter (Signed)
-----   Message from Thayer Headings, MD sent at 10/12/2021 12:06 PM EST ----- Regarding: FW: changing anticoagulation from Lovenox to Eliquis Please DC lovenox after tonights dose ( assuming wife can pick up prescription of Eliquis today) Start eliquis 5 mg po BID tomorrow am  Please call mr. Wiler's wife and explain the change we are making. I talked to her yesterday and told her what we were doing. I have heard back from Dr Scot Dock and dr. Mckinley Jewel and they agree with the change from Lovenox to [facility]   Thanks  PN     ----- Message ----- From: [name], MD Sent: 10/12/2021  10:40 AM EST To: Sanda Klein, MD, Angelia Mould, MD, # Subject: RE: changing anticoagulation from Lovenox to#  Yes.  It is okay to switch him to Eliquis.  Thank you for the update. ----- Message ----- From: Thayer Headings, MD Sent: 10/11/2021   5:46 PM EST To: Sanda Klein, MD, Angelia Mould, MD, # Subject: changing anticoagulation from Lovenox to Eli#  Leon Lyons,  Paralee Cancel was seen by multiple services during his recent hospitalization. 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    I have checked with Dr. Scot Dock of vascular surgery.  He has given the OK to transition [name] from Lovenox to Eliquis if he is ok to do so from a Oncology standpoint.    Cardiology is also in favor of starting Eliquis instead of Lovenox if suitable with you  He has an appt with you next week.   Would you let us know if we can change his Lovenox to Eliquis from an oncology standpoint .  Thanks   [place]

## 2021-10-17 MED ORDER — ROSUVASTATIN CALCIUM 5 MG PO TABS: 5.0000 mg | ORAL_TABLET | Freq: Every day | ORAL | 3 refills | Status: AC

## 2021-10-17 MED ORDER — APIXABAN 5 MG PO TABS
5.0000 mg | ORAL_TABLET | Freq: Two times a day (BID) | ORAL | 3 refills | Status: AC
Start: 2021-10-17 — End: ?

## 2021-10-17 NOTE — Telephone Encounter (Signed)
Spoke with the patient's wife and advised that Dr. Julien Nordmann agrees to switch the patient to Eliquis. Rx has been sent in. The patient will start tomorrow morning.

## 2021-10-17 NOTE — Telephone Encounter (Signed)
[name] returning call. She states to call the home number, but if they do not answer to try her cell phone: [phone].

## 2021-10-17 NOTE — Telephone Encounter (Signed)
Dr Valeta Harms, we tried calling the pt and have left 2 msgs now. Do you want [place] to mail him a letter? I was not sure since he is under hospice care at this point, thanks!

## 2021-10-17 NOTE — Telephone Encounter (Signed)
ATC LVMTCB x 1  

## 2021-10-18 ENCOUNTER — Other Ambulatory Visit: Payer: Self-pay | Admitting: *Deleted

## 2021-10-18 NOTE — Patient Outreach (Signed)
[facility]) Care Management  3/52/4818  [place] [birth year] [phone]   Outgoing call placed to member/wife, no answer to listed home and mobile numbers.  HIPAA compliant voice message left, will follow up within the next 3-5 business days.  Valente David, RN, MSN, [place] Manager [phone]

## 2021-10-19 ENCOUNTER — Ambulatory Visit: Payer: Medicare Other | Admitting: Internal Medicine

## 2021-10-19 ENCOUNTER — Other Ambulatory Visit: Payer: Medicare Other

## 2021-10-20 ENCOUNTER — Inpatient Hospital Stay: Payer: Medicare Other

## 2021-10-20 ENCOUNTER — Other Ambulatory Visit: Payer: Self-pay | Admitting: *Deleted

## 2021-10-20 ENCOUNTER — Inpatient Hospital Stay: Payer: Medicare Other | Admitting: Internal Medicine

## 2021-10-20 NOTE — Patient Outreach (Signed)
[facility]) Care Management  [place]  1/93/7902   Leon Lyons [birth year] [phone]   Incoming call received from [facility] wife.  Denies any urgent concerns, encouraged to contact this care manager with questions.    Encounter Medications:  Outpatient Encounter Medications as of 10/20/2021  Medication Sig Note   acetaminophen (TYLENOL) 500 MG tablet Take 500-1,000 mg by mouth every 8 (eight) hours as needed (for pain).    alum & mag hydroxide-simeth (MAALOX/MYLANTA) 200-200-20 MG/5ML suspension Take 15-30 mLs by mouth every 2 (two) hours as needed for indigestion.    apixaban (ELIQUIS) 5 MG TABS tablet Take 1 tablet (5 mg total) by mouth 2 (two) times daily.    bisacodyl (DULCOLAX) 10 MG suppository Place 1 suppository (10 mg total) rectally daily as needed for moderate constipation.    budesonide (PULMICORT) 0.25 MG/2ML nebulizer solution Take 2 mLs (0.25 mg total) by nebulization 2 (two) times daily.    dextromethorphan-guaiFENesin (MUCINEX DM) 30-600 MG 12hr tablet Take 1 tablet by mouth 2 (two) times daily.    diclofenac (FLECTOR) 1.3 % PTCH Place 1 patch onto the skin 2 (two) times daily. Apply to left shoulder    diltiazem (CARDIZEM) 120 MG tablet Take 120 mg by mouth daily. 09/09/2021: Med/strength/sig confirmed by Nira Conn" @ [facility] in [place] (called X5 to get a live person)   feeding supplement (ENSURE ENLIVE / ENSURE PLUS) LIQD Take 237 mLs by mouth 2 (two) times daily between meals.    folic acid (FOLVITE) 1 MG tablet Take 1 tablet (1 mg total) by mouth daily.    levalbuterol (XOPENEX) 0.63 MG/3ML nebulizer solution Take 3 mLs (0.63 mg total) by nebulization 2 (two) times daily.    Menthol-Methyl Salicylate (MUSCLE RUB) 10-15 % CREA Apply 1 application topically 2 (two) times daily. Left shoulder blade    metoprolol tartrate (LOPRESSOR) 50 MG tablet Take 1 tablet (50 mg total) by mouth 2 (two) times daily. Hold if systolic pressure <419  or heart rate <65    Multiple Vitamin (MULTIVITAMIN WITH MINERALS) TABS tablet Take 1 tablet by mouth daily.    oxyCODONE-acetaminophen (PERCOCET) 7.5-325 MG tablet Take 2 tablets by mouth every 6 (six) hours as needed for severe pain.    pantoprazole (PROTONIX) 40 MG tablet Take 1 tablet (40 mg total) by mouth daily.    polyethylene glycol (MIRALAX / GLYCOLAX) 17 g packet Take 17 g by mouth daily.    rosuvastatin (CRESTOR) 5 MG tablet Take 1 tablet (5 mg total) by mouth daily.    senna-docusate (SENOKOT-S) 8.6-50 MG tablet Take 2 tablets by mouth at bedtime.    sodium chloride (OCEAN) 0.65 % SOLN nasal spray Place 1 spray into both nostrils as needed for congestion.    No facility-administered encounter medications on file as of 10/20/2021.    Functional Status:  In your present state of health, do you have any difficulty performing the following activities: 09/19/2021 09/18/2021  Hearing? - N  Vision? - N  Difficulty concentrating or making decisions? - N  Walking or climbing stairs? - Y  Dressing or bathing? - Y  Doing errands, shopping? N -  Some recent data might be hidden    Fall/Depression Screening: Fall Risk  08/12/2021  Falls in the past year? 0  Number falls in past yr: 0  Injury with Fall? 0   PHQ 2/9 Scores 08/12/2021  PHQ - 2 Score 0    Assessment:   Care Plan Care Plan :  RNCM Plan of Care (Adult)  Updates made by Valente David, RN since 10/20/2021 12:00 AM     Problem: Management of health conditions post surgery   Priority: High     Long-Range Goal: Member will report and display recovery from surgery evidenced by no readmissions, infections, or cancer complications   Start Date: 08/12/2021  Expected End Date: 02/09/2022  This Visit's Progress: On track  Recent Progress: Not on track  Priority: High  Note:   Current Barriers:  Knowledge Deficits related to plan of care for management of Post surgery for Cecal Volvulus, and lung cancer complications Chronic  Disease Management support and education needs related to Post surgery for cecal volvulus  RNCM Clinical Goal(s):  Patient will verbalize understanding of plan for management of Post surgery for cecal volvulus and lung cancer complications take all medications exactly as prescribed and will call provider for medication related questions attend all scheduled medical appointments: Oncology, vascular,  continue to work with RN Care Manager to address care management and care coordination needs related to  cecal volvulus, post surgery and lung cancer complications work with PCP to obtain [facility] services  through collaboration with Consulting civil engineer, provider, and care team.   Interventions: Inter-disciplinary care team collaboration (see longitudinal plan of care) Evaluation of current treatment plan related to  self management and patient's adherence to plan as established by provider  Post surgical care for cecal volvulusGoal on track:  Yes. Evaluation of current treatment plan related to  Surgery for cecal volvulus , self-management and patient's adherence to plan as established by provider. Discussed plans with patient for ongoing care management follow up and provided patient with direct contact information for care management team Advised patient to call PCP if no call from [place] within the next week; Reviewed medications with patient and discussed pain management; Collaborated with Member regarding need for [facility] services (PT and nursing); Reviewed scheduled/upcoming provider appointments including; Discussed plans with patient for ongoing care management follow up and provided patient with direct contact information for care management team; Encouraged to use incentive spirometry to decrease risk of pneumonia    Oncology:  (Status: Goal on track:  Yes.) Long Term Goal Assessment of understanding of oncology diagnosis:  Assessed patient understanding of cancer diagnosis and recommended  treatment plan, Reviewed upcoming provider appointments and treatment appointments, Assessed available transportation to appointments and treatments. Has consistent/reliable transportation: Yes, and Assessed support system. Has consistent/reliable family or other support: Yes   Patient Goals/Self-Care Activities: Patient will self administer medications as prescribed Patient will attend all scheduled provider appointments Patient will continue to perform ADL's independently Patient will call provider office for new concerns or questions  Follow Up Plan:  Telephone follow up appointment with care management team member scheduled for:  10/28/2021    Update 1/4 -  Patient was admitted to hospital 12/2-12/10 for dehydration, pleural effusion, aortic leak requiring vascular repair, and MSSA bacteremia.  He was admitted to CIR for debility and ongoing therapy to recover and improve strength, discharged 1/3 home with home health and palliative care referral.  Spoke with member's wife, state member remains weak and slow to recover.  Admits she is overwhelmed with caring for member and making sure he has all that is needed for his care.  They do have friends and family that are helping with management of his care as well.  She has already reached out to [facility] to inquire about start of services for home health, waiting  for call back.  She has yet to hear from Palliative care team, will continue to wait for their call.  Member has follow up appointments scheduled with oncology on 1/12, [facility] on 1/13, repeat CT on 1/19, vascular surgeon follow up on 1/26, and cardiology on 2/7.  She has yet to call PCP office, will do that today.    Does not have all medications that were prescribed at discharge, [facility], medications are ready with the exception of Levalbuterol as they are waiting on a shipment to be delivered.  Wife aware, will pick up. She also inquires about a transport wheelchair that will  make it easier to get member to appointments, waiting on delivery.  Call placed to [employer], notified that this will be delivered today, wife aware.     Update 1/12 - Spoke with  wife, state member is physically still the same but mentally better.  Per chart, member has decided to be involved with hospice.  Wife state they will make first home visit next week.  She has questions about nebulizer, received the medication from pharmacy but does not have the machine.  She will call either PCP or pulmonology office to have them place order to [facility].  If she is unable to get this done, she will call this care manager for assistance.          Plan:  Follow-up: Patient agrees to Care Plan and Follow-up. Follow-up in 1 week(s).  Will confirm if member is active with hospice, if so will close case.  Valente David, RN, MSN, [place] Manager [phone]

## 2021-10-21 ENCOUNTER — Inpatient Hospital Stay: Payer: Medicare Other | Admitting: Internal Medicine

## 2021-10-21 LAB — MISC LABCORP TEST (SEND OUT): Labcorp test code: 9985

## 2021-10-25 ENCOUNTER — Ambulatory Visit: Payer: Self-pay | Admitting: *Deleted

## 2021-10-27 ENCOUNTER — Telehealth: Payer: Self-pay | Admitting: *Deleted

## 2021-10-27 ENCOUNTER — Inpatient Hospital Stay: Admit: 2021-10-27 | Payer: Medicare Other

## 2021-10-27 NOTE — Telephone Encounter (Signed)
Leon Lyons called about getting refills of the medications Leon Lyons was discharged home with. She tried calling the PCP and they said no call the person who wrote them.  Normally pts follow up here but he is not scheduled to come to the office, but he is not going to come here. What should I tell her particularly about the oxycodone. We can do a courtesy 1 time refill of the regular meds until they get to see the primary, but I do not know what to tell her about the oxycodone. He is on oxycodone 7.5/325 #30 filled 10/11/21. He has not seen any of his other doctors yet.

## 2021-10-28 ENCOUNTER — Encounter: Payer: Self-pay | Admitting: *Deleted

## 2021-10-28 ENCOUNTER — Other Ambulatory Visit: Payer: Self-pay | Admitting: *Deleted

## 2021-10-28 ENCOUNTER — Encounter: Payer: Self-pay | Admitting: Internal Medicine

## 2021-10-28 MED ORDER — OXYCODONE-ACETAMINOPHEN 7.5-325 MG PO TABS: 2.0000 | ORAL_TABLET | Freq: Four times a day (QID) | ORAL | 0 refills | Status: AC | PRN

## 2021-10-28 MED ORDER — FOLIC ACID 1 MG PO TABS: 1.0000 mg | ORAL_TABLET | Freq: Every day | ORAL | Status: AC

## 2021-10-28 MED ORDER — BUDESONIDE 0.25 MG/2ML IN SUSP: 0.2500 mg | Freq: Two times a day (BID) | RESPIRATORY_TRACT | 1 refills | Status: AC

## 2021-10-28 MED ORDER — METOPROLOL TARTRATE 50 MG PO TABS: 50.0000 mg | ORAL_TABLET | Freq: Two times a day (BID) | ORAL | Status: AC

## 2021-10-28 NOTE — Addendum Note (Signed)
Addended by: Alger Simons T on: 10/28/2021 11:34 AM   Modules accepted: Orders

## 2021-10-28 NOTE — Patient Outreach (Signed)
[facility]) Care Management  [place]  [birth year]   Leon Lyons [birth year] [id number]   Weekly transition of care call placed to member/wife, successful.  Denies any urgent concerns, encouraged to contact this care manager with questions.    Encounter Medications:  Outpatient Encounter Medications as of 10/28/2021  Medication Sig Note   acetaminophen (TYLENOL) 500 MG tablet Take 500-1,000 mg by mouth every 8 (eight) hours as needed (for pain).    alum & mag hydroxide-simeth (MAALOX/MYLANTA) 200-200-20 MG/5ML suspension Take 15-30 mLs by mouth every 2 (two) hours as needed for indigestion.    apixaban (ELIQUIS) 5 MG TABS tablet Take 1 tablet (5 mg total) by mouth 2 (two) times daily.    bisacodyl (DULCOLAX) 10 MG suppository Place 1 suppository (10 mg total) rectally daily as needed for moderate constipation.    budesonide (PULMICORT) 0.25 MG/2ML nebulizer solution Take 2 mLs (0.25 mg total) by nebulization 2 (two) times daily.    dextromethorphan-guaiFENesin (MUCINEX DM) 30-600 MG 12hr tablet Take 1 tablet by mouth 2 (two) times daily.    diclofenac (FLECTOR) 1.3 % PTCH Place 1 patch onto the skin 2 (two) times daily. Apply to left shoulder    diltiazem (CARDIZEM) 120 MG tablet Take 120 mg by mouth daily. 09/09/2021: Med/strength/sig confirmed by Nira Conn" @ [facility] in [place] (called X5 to get a live person)   feeding supplement (ENSURE ENLIVE / ENSURE PLUS) LIQD Take 237 mLs by mouth 2 (two) times daily between meals.    folic acid (FOLVITE) 1 MG tablet Take 1 tablet (1 mg total) by mouth daily.    levalbuterol (XOPENEX) 0.63 MG/3ML nebulizer solution Take 3 mLs (0.63 mg total) by nebulization 2 (two) times daily.    Menthol-Methyl Salicylate (MUSCLE RUB) 10-15 % CREA Apply 1 application topically 2 (two) times daily. Left shoulder blade    metoprolol tartrate (LOPRESSOR) 50 MG tablet Take 1 tablet (50 mg total) by mouth 2 (two) times daily. Hold if  systolic pressure <144 or heart rate <65    Multiple Vitamin (MULTIVITAMIN WITH MINERALS) TABS tablet Take 1 tablet by mouth daily.    oxyCODONE-acetaminophen (PERCOCET) 7.5-325 MG tablet Take 2 tablets by mouth every 6 (six) hours as needed for severe pain.    pantoprazole (PROTONIX) 40 MG tablet Take 1 tablet (40 mg total) by mouth daily.    polyethylene glycol (MIRALAX / GLYCOLAX) 17 g packet Take 17 g by mouth daily.    rosuvastatin (CRESTOR) 5 MG tablet Take 1 tablet (5 mg total) by mouth daily.    senna-docusate (SENOKOT-S) 8.6-50 MG tablet Take 2 tablets by mouth at bedtime.    sodium chloride (OCEAN) 0.65 % SOLN nasal spray Place 1 spray into both nostrils as needed for congestion.    [DISCONTINUED] budesonide (PULMICORT) 0.25 MG/2ML nebulizer solution Take 2 mLs (0.25 mg total) by nebulization 2 (two) times daily.    [DISCONTINUED] folic acid (FOLVITE) 1 MG tablet Take 1 tablet (1 mg total) by mouth daily.    [DISCONTINUED] metoprolol tartrate (LOPRESSOR) 50 MG tablet Take 1 tablet (50 mg total) by mouth 2 (two) times daily. Hold if systolic pressure <818 or heart rate <65    [DISCONTINUED] oxyCODONE-acetaminophen (PERCOCET) 7.5-325 MG tablet Take 2 tablets by mouth every 6 (six) hours as needed for severe pain.    No facility-administered encounter medications on file as of 10/28/2021.    Functional Status:  In your present state of health, do you have any difficulty performing  the following activities: 09/19/2021 09/18/2021  Hearing? - N  Vision? - N  Difficulty concentrating or making decisions? - N  Walking or climbing stairs? - Y  Dressing or bathing? - Y  Doing errands, shopping? N -  Some recent data might be hidden    Fall/Depression Screening: Fall Risk  08/12/2021  Falls in the past year? 0  Number falls in past yr: 0  Injury with Fall? 0   PHQ 2/9 Scores 08/12/2021  PHQ - 2 Score 0    Assessment:   Care Plan Care Plan : RNCM Plan of Care (Adult)  Updates made  by Valente David, RN since 10/28/2021 12:00 AM     Problem: Management of health conditions post surgery   Priority: High     Long-Range Goal: Member will report and display recovery from surgery evidenced by no readmissions, infections, or cancer complications   Start Date: 08/12/2021  Expected End Date: 02/09/2022  This Visit's Progress: On track  Recent Progress: On track  Priority: High  Note:   Current Barriers:  Knowledge Deficits related to plan of care for management of Post surgery for Cecal Volvulus, and lung cancer complications Chronic Disease Management support and education needs related to Post surgery for cecal volvulus  RNCM Clinical Goal(s):  Patient will verbalize understanding of plan for management of Post surgery for cecal volvulus and lung cancer complications take all medications exactly as prescribed and will call provider for medication related questions attend all scheduled medical appointments: Oncology, vascular,  continue to work with RN Care Manager to address care management and care coordination needs related to  cecal volvulus, post surgery and lung cancer complications work with PCP to obtain [facility] services  through collaboration with Consulting civil engineer, provider, and care team.   Interventions: Inter-disciplinary care team collaboration (see longitudinal plan of care) Evaluation of current treatment plan related to  self management and patient's adherence to plan as established by provider  Post surgical care for cecal volvulusGoal on track:  Yes. Evaluation of current treatment plan related to  Surgery for cecal volvulus , self-management and patient's adherence to plan as established by provider. Discussed plans with patient for ongoing care management follow up and provided patient with direct contact information for care management team Advised patient to call PCP if no call from [place] within the next week; Reviewed medications with patient and  discussed pain management; Collaborated with Member regarding need for [facility] services (PT and nursing); Reviewed scheduled/upcoming provider appointments including; Discussed plans with patient for ongoing care management follow up and provided patient with direct contact information for care management team; Encouraged to use incentive spirometry to decrease risk of pneumonia    Oncology:  (Status: Goal on track:  Yes.) Long Term Goal Assessment of understanding of oncology diagnosis:  Assessed patient understanding of cancer diagnosis and recommended treatment plan, Reviewed upcoming provider appointments and treatment appointments, Assessed available transportation to appointments and treatments. Has consistent/reliable transportation: Yes, and Assessed support system. Has consistent/reliable family or other support: Yes   Patient Goals/Self-Care Activities: Patient will self administer medications as prescribed Patient will attend all scheduled provider appointments Patient will continue to perform ADL's independently Patient will call provider office for new concerns or questions  Follow Up Plan:  Telephone follow up appointment with care management team member scheduled for:  11-21-21    Update 1/4 -  Patient was admitted to hospital 12/2-12/10 for dehydration, pleural effusion, aortic leak requiring vascular repair, and MSSA  bacteremia.  He was admitted to CIR for debility and ongoing therapy to recover and improve strength, discharged 1/3 home with home health and palliative care referral.  Spoke with member's wife, state member remains weak and slow to recover.  Admits she is overwhelmed with caring for member and making sure he has all that is needed for his care.  They do have friends and family that are helping with management of his care as well.  She has already reached out to [facility] to inquire about start of services for home health, waiting for call back.  She has yet to  hear from Palliative care team, will continue to wait for their call.  Member has follow up appointments scheduled with oncology on 1/12, [facility] on 1/13, repeat CT on 1/19, vascular surgeon follow up on 1/26, and cardiology on 2/7.  She has yet to call PCP office, will do that today.    Does not have all medications that were prescribed at discharge, call placed to [facility], medications are ready with the exception of Levalbuterol as they are waiting on a shipment to be delivered.  Wife aware, will pick up. She also inquires about a transport wheelchair that will make it easier to get member to appointments, waiting on delivery.  Call placed to [employer], notified that this will be delivered today, wife aware.     Update 1/12 - Spoke with  wife, state member is physically still the same but mentally better.  Per chart, member has decided to be involved with hospice.  Wife state they will make first home visit next week.  She has questions about nebulizer, received the medication from pharmacy but does not have the machine.  She will call either PCP or pulmonology office to have them place order to [place].  If she is unable to get this done, she will call this care manager for assistance.   Update 1/20 - Wife report member continues to have some bad days and some "ok" days.  She confirmed he is now active with [facility] hospice for palliative care.  First visit was this past Wednesday, next visit on Monday.  He was supposed to follow up with general surgery yesterday, was to weak to attend appointment, rescheduled for 1/26.  She does have nebulizer machine now, using as instructed.  Will also continue to have home health involvement.  No upcoming appointments with oncology, member will decide if/when he would like to return.  He does have appointments with cardiology on 2/7 and pulmonology on 2/8.          Plan:  Follow-up: Patient agrees to Care Plan and Follow-up. Follow-up in 1 week(s).  Valente David, RN, MSN, [place] Manager [phone]

## 2021-10-28 NOTE — Telephone Encounter (Signed)
So, I filled numerous meds including the oxycodone. However, as Dr. Raliegh Ip stated, he is hospice. I'm not sure why that team is not following his pain and writing for medications. We are not seeing him back because he's on hospice.

## 2021-10-31 ENCOUNTER — Other Ambulatory Visit: Payer: Self-pay

## 2021-10-31 DIAGNOSIS — I714 Abdominal aortic aneurysm, without rupture, unspecified: Secondary | ICD-10-CM

## 2021-11-01 ENCOUNTER — Encounter: Payer: Self-pay | Admitting: Internal Medicine

## 2021-11-03 ENCOUNTER — Encounter: Payer: Medicare Other | Admitting: Vascular Surgery

## 2021-11-04 ENCOUNTER — Other Ambulatory Visit: Payer: Self-pay | Admitting: *Deleted

## 2021-11-09 NOTE — Patient Outreach (Signed)
[facility]) Care Management  0/21/1173  Wilferd Ritson [place] [birth year] [id number]   Noted that member was admitted to [facility] 1/22-1/24, discharged home with hospice.  Outgoing call placed to [facility] to confirm member is now under hospice care.  Spoke with Fraser Din, confirmed.  Will close case at this time.  Valente David, RN, MSN, [place] Manager [phone]

## 2021-11-09 DEATH — deceased

## 2021-11-15 ENCOUNTER — Ambulatory Visit: Payer: Medicare Other | Admitting: Cardiology

## 2021-11-16 ENCOUNTER — Ambulatory Visit: Payer: Medicare Other | Admitting: Pulmonary Disease

## 2021-11-24 ENCOUNTER — Encounter: Payer: Medicare Other | Admitting: Vascular Surgery

## 2023-01-22 IMAGING — PT NM PET TUM IMG INITIAL (PI) SKULL BASE T - THIGH
1 of 7 series · 2 of 25 positions shown · non-contrast
Comparison: CT chest 12/28/2020.

CLINICAL DATA: Initial treatment strategy for lung cancer.

EXAM:
NUCLEAR MEDICINE PET SKULL BASE TO THIGH
TECHNIQUE: 8.9 mCi F-18 FDG was injected intravenously. Full-ring PET imaging
was performed from the skull base to thigh after the radiotracer. CT
data was obtained and used for attenuation correction and anatomic
localization.
Fasting blood glucose: 97 mg/dl

[Series 4: ct sk_thigh 5.0 bf37 · axial · 5.0mm · 0.98mm/px · z∈[-740,-556]mm · 2 of 232 slices shown]
[im 47/232  brain]
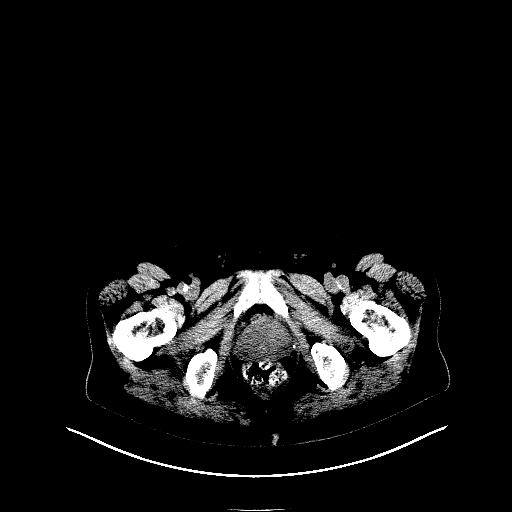
[im 93/232  brain]
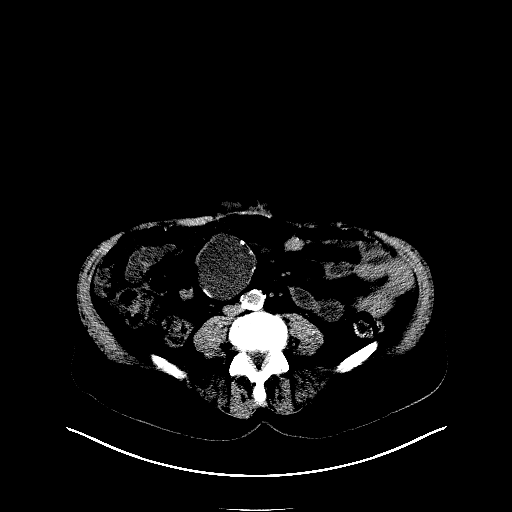

[2 of 25 positions shown; findings below may reference images not displayed]

FINDINGS: Mediastinal blood pool activity: SUV max

Liver activity: SUV max NA

NECK:

No abnormal hypermetabolism.

Incidental CT findings:

None.

CHEST:

Mildly asymmetric hypermetabolism in the left lobe of the thyroid,
SUV max 3.6. Hypermetabolic mediastinal lymph nodes are seen
bilaterally. Index right paratracheal lymph node measures 12 mm
(4/60) with an SUV max of 15.1. Hypermetabolic left hilar adenopathy
with a central left upper lobe hypermetabolic mass best measured on
12/28/2020, SUV max of 11.3.

Incidental CT findings:

Atherosclerotic calcification of the aorta, aortic valve and
coronary arteries. Aberrant right subclavian artery. Heart is
enlarged. No pericardial effusion. Obstruction of the left upper
lobe bronchus with post obstructive collapse/consolidation in the
left upper lobe, increased from 12/28/2020.

ABDOMEN/PELVIS:

No abnormal hypermetabolism in the liver, adrenal glands, spleen or
pancreas. No hypermetabolic lymph nodes.

Incidental CT findings:

Liver is unremarkable. Stones are seen in the gallbladder. Slight
nodular thickening of both adrenal glands. Stones are seen in the
right kidney. Marked atrophy in the left kidney with hydronephrosis
the level of the ureteropelvic junction. Spleen and pancreas are
unremarkable. Proximal gastric wall thickening. Scattered ascites.
Bladder wall thickening. Prostate is enlarged. Cystic structure in
the small bowel mesentery has areas of peripheral calcification,
measuring 5.8 x 6.2 cm. There are low-density fluid collection along
the subcutaneous ventral midline abdominal wall, which may be due to
prior surgery. Atherosclerotic calcification of the aorta.

SKELETON:

There may be a 13 mm right paramidline soft tissue nodule posterior
to the L2 vertebral body (4/123) with an SUV max of 5.0. No
hypermetabolic osseous lesions.

Incidental CT findings:

Degenerative changes in the spine.
IMPRESSION: 1. Centrally obstructing hypermetabolic left upper lobe mass with
hypermetabolic adenopathy extending to the contralateral
mediastinum. Findings are most consistent with stage IIIB primary
bronchogenic carcinoma.
2. Hypermetabolic right paramidline soft tissue nodule posterior to
the L2 vertebral body, indeterminate. Finding may be degenerative.
Difficult to exclude a metastasis.
3. Peripherally calcified cystic lesion in the small bowel mesentery
without associated hypermetabolism, favoring a benign lesion.
4. Asymmetric hypermetabolism in the left lobe of the thyroid.
Consider thyroid ultrasound in further evaluation, as clinically
indicated.
5. Cholelithiasis.
6. Right renal stones.
7. Left renal atrophy with hydronephrosis, suggesting a chronic UPJ
obstruction.
8. Proximal gastric wall thickening.
9. Enlarged prostate.
10. Aortic atherosclerosis ([id number]). Coronary artery
calcification.

## 2023-08-30 NOTE — Telephone Encounter (Signed)
Telephone call
